# Patient Record
Sex: Male | Born: 1950 | Race: White | Hispanic: No | Marital: Single | Smoking: Never smoker
Health system: Southern US, Community
[De-identification: ages and names within clinical notes are randomized; demographics above are authoritative.]

## PROBLEM LIST (undated history)

## (undated) DIAGNOSIS — E119 Type 2 diabetes mellitus without complications: Secondary | ICD-10-CM

## (undated) DIAGNOSIS — I2510 Atherosclerotic heart disease of native coronary artery without angina pectoris: Secondary | ICD-10-CM

## (undated) DIAGNOSIS — I219 Acute myocardial infarction, unspecified: Secondary | ICD-10-CM

## (undated) DIAGNOSIS — I10 Essential (primary) hypertension: Secondary | ICD-10-CM

## (undated) DIAGNOSIS — I499 Cardiac arrhythmia, unspecified: Secondary | ICD-10-CM

## (undated) DIAGNOSIS — F32A Depression, unspecified: Secondary | ICD-10-CM

## (undated) DIAGNOSIS — F419 Anxiety disorder, unspecified: Secondary | ICD-10-CM

## (undated) DIAGNOSIS — I639 Cerebral infarction, unspecified: Secondary | ICD-10-CM

## (undated) HISTORY — PX: CORONARY ARTERY BYPASS GRAFT: SHX141

---

## 2019-10-29 DIAGNOSIS — I10 Essential (primary) hypertension: Secondary | ICD-10-CM | POA: Insufficient documentation

## 2019-10-29 DIAGNOSIS — E119 Type 2 diabetes mellitus without complications: Secondary | ICD-10-CM | POA: Insufficient documentation

## 2020-02-26 ENCOUNTER — Emergency Department (HOSPITAL_COMMUNITY): Payer: Medicare HMO

## 2020-02-26 ENCOUNTER — Other Ambulatory Visit: Payer: Self-pay

## 2020-02-26 ENCOUNTER — Inpatient Hospital Stay (HOSPITAL_COMMUNITY)
Admission: EM | Admit: 2020-02-26 | Discharge: 2020-03-08 | DRG: 638 | Disposition: A | Discharge status: Skilled Nursing Facility | Payer: Medicare HMO | Attending: Internal Medicine | Admitting: Internal Medicine

## 2020-02-26 DIAGNOSIS — M8448XA Pathological fracture, other site, initial encounter for fracture: Secondary | ICD-10-CM | POA: Diagnosis present

## 2020-02-26 DIAGNOSIS — Y9301 Activity, walking, marching and hiking: Secondary | ICD-10-CM | POA: Diagnosis present

## 2020-02-26 DIAGNOSIS — R000 Tachycardia, unspecified: Secondary | ICD-10-CM | POA: Diagnosis present

## 2020-02-26 DIAGNOSIS — S2232XA Fracture of one rib, left side, initial encounter for closed fracture: Secondary | ICD-10-CM

## 2020-02-26 DIAGNOSIS — I248 Other forms of acute ischemic heart disease: Secondary | ICD-10-CM | POA: Diagnosis present

## 2020-02-26 DIAGNOSIS — E11649 Type 2 diabetes mellitus with hypoglycemia without coma: Secondary | ICD-10-CM | POA: Diagnosis present

## 2020-02-26 DIAGNOSIS — E875 Hyperkalemia: Secondary | ICD-10-CM | POA: Diagnosis present

## 2020-02-26 DIAGNOSIS — I959 Hypotension, unspecified: Secondary | ICD-10-CM | POA: Diagnosis not present

## 2020-02-26 DIAGNOSIS — I480 Paroxysmal atrial fibrillation: Secondary | ICD-10-CM | POA: Diagnosis present

## 2020-02-26 DIAGNOSIS — I361 Nonrheumatic tricuspid (valve) insufficiency: Secondary | ICD-10-CM | POA: Diagnosis not present

## 2020-02-26 DIAGNOSIS — E1110 Type 2 diabetes mellitus with ketoacidosis without coma: Secondary | ICD-10-CM | POA: Diagnosis present

## 2020-02-26 DIAGNOSIS — E785 Hyperlipidemia, unspecified: Secondary | ICD-10-CM | POA: Diagnosis present

## 2020-02-26 DIAGNOSIS — Z91128 Patient's intentional underdosing of medication regimen for other reason: Secondary | ICD-10-CM

## 2020-02-26 DIAGNOSIS — Z9181 History of falling: Secondary | ICD-10-CM | POA: Diagnosis not present

## 2020-02-26 DIAGNOSIS — Z8673 Personal history of transient ischemic attack (TIA), and cerebral infarction without residual deficits: Secondary | ICD-10-CM

## 2020-02-26 DIAGNOSIS — Z7901 Long term (current) use of anticoagulants: Secondary | ICD-10-CM

## 2020-02-26 DIAGNOSIS — I4892 Unspecified atrial flutter: Secondary | ICD-10-CM | POA: Diagnosis present

## 2020-02-26 DIAGNOSIS — R7989 Other specified abnormal findings of blood chemistry: Secondary | ICD-10-CM | POA: Diagnosis not present

## 2020-02-26 DIAGNOSIS — T383X6A Underdosing of insulin and oral hypoglycemic [antidiabetic] drugs, initial encounter: Secondary | ICD-10-CM | POA: Diagnosis present

## 2020-02-26 DIAGNOSIS — Z951 Presence of aortocoronary bypass graft: Secondary | ICD-10-CM

## 2020-02-26 DIAGNOSIS — T50916A Underdosing of multiple unspecified drugs, medicaments and biological substances, initial encounter: Secondary | ICD-10-CM | POA: Diagnosis present

## 2020-02-26 DIAGNOSIS — R34 Anuria and oliguria: Secondary | ICD-10-CM

## 2020-02-26 DIAGNOSIS — F32A Depression, unspecified: Secondary | ICD-10-CM | POA: Diagnosis present

## 2020-02-26 DIAGNOSIS — R112 Nausea with vomiting, unspecified: Secondary | ICD-10-CM | POA: Diagnosis present

## 2020-02-26 DIAGNOSIS — E860 Dehydration: Secondary | ICD-10-CM | POA: Diagnosis present

## 2020-02-26 DIAGNOSIS — Z20822 Contact with and (suspected) exposure to covid-19: Secondary | ICD-10-CM | POA: Diagnosis present

## 2020-02-26 DIAGNOSIS — F419 Anxiety disorder, unspecified: Secondary | ICD-10-CM | POA: Diagnosis present

## 2020-02-26 DIAGNOSIS — I10 Essential (primary) hypertension: Secondary | ICD-10-CM | POA: Diagnosis present

## 2020-02-26 DIAGNOSIS — R778 Other specified abnormalities of plasma proteins: Secondary | ICD-10-CM | POA: Diagnosis not present

## 2020-02-26 DIAGNOSIS — R7881 Bacteremia: Secondary | ICD-10-CM | POA: Diagnosis not present

## 2020-02-26 DIAGNOSIS — I2510 Atherosclerotic heart disease of native coronary artery without angina pectoris: Secondary | ICD-10-CM | POA: Diagnosis present

## 2020-02-26 DIAGNOSIS — Y92009 Unspecified place in unspecified non-institutional (private) residence as the place of occurrence of the external cause: Secondary | ICD-10-CM

## 2020-02-26 DIAGNOSIS — E1100 Type 2 diabetes mellitus with hyperosmolarity without nonketotic hyperglycemic-hyperosmolar coma (NKHHC): Secondary | ICD-10-CM | POA: Diagnosis present

## 2020-02-26 DIAGNOSIS — R45851 Suicidal ideations: Secondary | ICD-10-CM | POA: Diagnosis present

## 2020-02-26 DIAGNOSIS — W010XXA Fall on same level from slipping, tripping and stumbling without subsequent striking against object, initial encounter: Secondary | ICD-10-CM | POA: Diagnosis present

## 2020-02-26 DIAGNOSIS — I4891 Unspecified atrial fibrillation: Secondary | ICD-10-CM | POA: Diagnosis not present

## 2020-02-26 DIAGNOSIS — R0602 Shortness of breath: Secondary | ICD-10-CM

## 2020-02-26 HISTORY — DX: Type 2 diabetes mellitus without complications: E11.9

## 2020-02-26 HISTORY — DX: Atherosclerotic heart disease of native coronary artery without angina pectoris: I25.10

## 2020-02-26 HISTORY — DX: Anxiety disorder, unspecified: F41.9

## 2020-02-26 HISTORY — DX: Essential (primary) hypertension: I10

## 2020-02-26 HISTORY — DX: Acute myocardial infarction, unspecified: I21.9

## 2020-02-26 HISTORY — DX: Cardiac arrhythmia, unspecified: I49.9

## 2020-02-26 HISTORY — DX: Depression, unspecified: F32.A

## 2020-02-26 HISTORY — DX: Cerebral infarction, unspecified: I63.9

## 2020-02-26 LAB — CBC WITH DIFFERENTIAL/PLATELET
Abs Immature Granulocytes: 0.15 10*3/uL — ABNORMAL HIGH (ref 0.00–0.07)
Basophils Absolute: 0 10*3/uL (ref 0.0–0.1)
Basophils Relative: 0 %
Eosinophils Absolute: 0 10*3/uL (ref 0.0–0.5)
Eosinophils Relative: 0 %
HCT: 45.8 % (ref 39.0–52.0)
Hemoglobin: 14.9 g/dL (ref 13.0–17.0)
Immature Granulocytes: 1 %
Lymphocytes Relative: 5 %
Lymphs Abs: 0.7 10*3/uL (ref 0.7–4.0)
MCH: 28.5 pg (ref 26.0–34.0)
MCHC: 32.5 g/dL (ref 30.0–36.0)
MCV: 87.7 fL (ref 80.0–100.0)
Monocytes Absolute: 0.9 10*3/uL (ref 0.1–1.0)
Monocytes Relative: 6 %
Neutro Abs: 12.9 10*3/uL — ABNORMAL HIGH (ref 1.7–7.7)
Neutrophils Relative %: 88 %
Platelets: 205 10*3/uL (ref 150–400)
RBC: 5.22 MIL/uL (ref 4.22–5.81)
RDW: 14.6 % (ref 11.5–15.5)
WBC: 14.7 10*3/uL — ABNORMAL HIGH (ref 4.0–10.5)
nRBC: 0 % (ref 0.0–0.2)

## 2020-02-26 LAB — CBC
HCT: 53.6 % — ABNORMAL HIGH (ref 39.0–52.0)
Hemoglobin: 16.8 g/dL (ref 13.0–17.0)
MCH: 28.4 pg (ref 26.0–34.0)
MCHC: 31.3 g/dL (ref 30.0–36.0)
MCV: 90.7 fL (ref 80.0–100.0)
Platelets: 217 10*3/uL (ref 150–400)
RBC: 5.91 MIL/uL — ABNORMAL HIGH (ref 4.22–5.81)
RDW: 14.6 % (ref 11.5–15.5)
WBC: 16.1 10*3/uL — ABNORMAL HIGH (ref 4.0–10.5)
nRBC: 0 % (ref 0.0–0.2)

## 2020-02-26 LAB — COMPREHENSIVE METABOLIC PANEL
ALT: 18 U/L (ref 0–44)
ALT: 15 U/L (ref 0–44)
AST: 17 U/L (ref 15–41)
AST: 16 U/L (ref 15–41)
Albumin: 3.5 g/dL (ref 3.5–5.0)
Albumin: 2.9 g/dL — ABNORMAL LOW (ref 3.5–5.0)
Alkaline Phosphatase: 221 U/L — ABNORMAL HIGH (ref 38–126)
Alkaline Phosphatase: 181 U/L — ABNORMAL HIGH (ref 38–126)
Anion gap: 29 — ABNORMAL HIGH (ref 5–15)
Anion gap: 15 (ref 5–15)
BUN: 37 mg/dL — ABNORMAL HIGH (ref 8–23)
BUN: 34 mg/dL — ABNORMAL HIGH (ref 8–23)
CO2: 12 mmol/L — ABNORMAL LOW (ref 22–32)
CO2: 19 mmol/L — ABNORMAL LOW (ref 22–32)
Calcium: 10.6 mg/dL — ABNORMAL HIGH (ref 8.9–10.3)
Calcium: 9.1 mg/dL (ref 8.9–10.3)
Chloride: 94 mmol/L — ABNORMAL LOW (ref 98–111)
Chloride: 107 mmol/L (ref 98–111)
Creatinine, Ser: 1.89 mg/dL — ABNORMAL HIGH (ref 0.61–1.24)
Creatinine, Ser: 1.41 mg/dL — ABNORMAL HIGH (ref 0.61–1.24)
GFR, Estimated: 38 mL/min — ABNORMAL LOW (ref >60)
GFR, Estimated: 54 mL/min — ABNORMAL LOW (ref >60)
Glucose, Bld: 544 mg/dL (ref 70–99)
Glucose, Bld: 215 mg/dL — ABNORMAL HIGH (ref 70–99)
Potassium: 5.6 mmol/L — ABNORMAL HIGH (ref 3.5–5.1)
Potassium: 3.6 mmol/L (ref 3.5–5.1)
Sodium: 135 mmol/L (ref 135–145)
Sodium: 141 mmol/L (ref 135–145)
Total Bilirubin: 2.3 mg/dL — ABNORMAL HIGH (ref 0.3–1.2)
Total Bilirubin: 1.3 mg/dL — ABNORMAL HIGH (ref 0.3–1.2)
Total Protein: 7.4 g/dL (ref 6.5–8.1)
Total Protein: 5.7 g/dL — ABNORMAL LOW (ref 6.5–8.1)

## 2020-02-26 LAB — HEMOGLOBIN A1C
Hgb A1c MFr Bld: 12.8 % — ABNORMAL HIGH (ref 4.8–5.6)
Mean Plasma Glucose: 320.66 mg/dL

## 2020-02-26 LAB — I-STAT CHEM 8, ED
BUN: 39 mg/dL — ABNORMAL HIGH (ref 8–23)
Calcium, Ion: 1.17 mmol/L (ref 1.15–1.40)
Chloride: 102 mmol/L (ref 98–111)
Creatinine, Ser: 1.1 mg/dL (ref 0.61–1.24)
Glucose, Bld: 546 mg/dL (ref 70–99)
HCT: 55 % — ABNORMAL HIGH (ref 39.0–52.0)
Hemoglobin: 18.7 g/dL — ABNORMAL HIGH (ref 13.0–17.0)
Potassium: 5.2 mmol/L — ABNORMAL HIGH (ref 3.5–5.1)
Sodium: 135 mmol/L (ref 135–145)
TCO2: 14 mmol/L — ABNORMAL LOW (ref 22–32)

## 2020-02-26 LAB — CBG MONITORING, ED
Glucose-Capillary: 451 mg/dL — ABNORMAL HIGH (ref 70–99)
Glucose-Capillary: 402 mg/dL — ABNORMAL HIGH (ref 70–99)
Glucose-Capillary: 354 mg/dL — ABNORMAL HIGH (ref 70–99)
Glucose-Capillary: 378 mg/dL — ABNORMAL HIGH (ref 70–99)
Glucose-Capillary: 275 mg/dL — ABNORMAL HIGH (ref 70–99)
Glucose-Capillary: 200 mg/dL — ABNORMAL HIGH (ref 70–99)

## 2020-02-26 LAB — I-STAT VENOUS BLOOD GAS, ED
Acid-base deficit: 15 mmol/L — ABNORMAL HIGH (ref 0.0–2.0)
Bicarbonate: 13.1 mmol/L — ABNORMAL LOW (ref 20.0–28.0)
Calcium, Ion: 1.17 mmol/L (ref 1.15–1.40)
HCT: 54 % — ABNORMAL HIGH (ref 39.0–52.0)
Hemoglobin: 18.4 g/dL — ABNORMAL HIGH (ref 13.0–17.0)
O2 Saturation: 69 %
Potassium: 5.2 mmol/L — ABNORMAL HIGH (ref 3.5–5.1)
Sodium: 135 mmol/L (ref 135–145)
TCO2: 14 mmol/L — ABNORMAL LOW (ref 22–32)
pCO2, Ven: 37.6 mmHg — ABNORMAL LOW (ref 44.0–60.0)
pH, Ven: 7.151 — CL (ref 7.250–7.430)
pO2, Ven: 46 mmHg — ABNORMAL HIGH (ref 32.0–45.0)

## 2020-02-26 LAB — RESP PANEL BY RT-PCR (FLU A&B, COVID) ARPGX2
Influenza A by PCR: NEGATIVE
Influenza B by PCR: NEGATIVE
SARS Coronavirus 2 by RT PCR: NEGATIVE

## 2020-02-26 LAB — GLUCOSE, CAPILLARY
Glucose-Capillary: 136 mg/dL — ABNORMAL HIGH (ref 70–99)
Glucose-Capillary: 144 mg/dL — ABNORMAL HIGH (ref 70–99)

## 2020-02-26 LAB — PROTIME-INR
INR: 1.1 (ref 0.8–1.2)
Prothrombin Time: 13.5 seconds (ref 11.4–15.2)

## 2020-02-26 LAB — CK: Total CK: 100 U/L (ref 49–397)

## 2020-02-26 LAB — PHOSPHORUS: Phosphorus: 2 mg/dL — ABNORMAL LOW (ref 2.5–4.6)

## 2020-02-26 LAB — BETA-HYDROXYBUTYRIC ACID: Beta-Hydroxybutyric Acid: >8 mmol/L — ABNORMAL HIGH (ref 0.05–0.27)

## 2020-02-26 LAB — SAMPLE TO BLOOD BANK

## 2020-02-26 LAB — ETHANOL: Alcohol, Ethyl (B): <10 mg/dL (ref <10)

## 2020-02-26 LAB — OSMOLALITY: Osmolality: 319 mOsm/kg — ABNORMAL HIGH (ref 275–295)

## 2020-02-26 LAB — LACTIC ACID, PLASMA
Lactic Acid, Venous: 3.2 mmol/L (ref 0.5–1.9)
Lactic Acid, Venous: 2.6 mmol/L (ref 0.5–1.9)

## 2020-02-26 LAB — HIV ANTIBODY (ROUTINE TESTING W REFLEX): HIV Screen 4th Generation wRfx: NONREACTIVE

## 2020-02-26 LAB — MAGNESIUM: Magnesium: 2.4 mg/dL (ref 1.7–2.4)

## 2020-02-26 LAB — APTT: aPTT: 24 seconds (ref 24–36)

## 2020-02-26 MED ORDER — SODIUM CHLORIDE 0.9% FLUSH
3.0000 mL | Freq: Two times a day (BID) | INTRAVENOUS | Status: DC
  Administered 2020-02-28 – 2020-03-08 (×18): 3 mL via INTRAVENOUS

## 2020-02-26 MED ORDER — DEXTROSE IN LACTATED RINGERS 5 % IV SOLN: INTRAVENOUS | Status: DC

## 2020-02-26 MED ORDER — ACETAMINOPHEN 650 MG RE SUPP: 650.0000 mg | Freq: Four times a day (QID) | RECTAL | Status: DC | PRN

## 2020-02-26 MED ORDER — LACTATED RINGERS IV BOLUS: 20.0000 mL/kg | Freq: Once | INTRAVENOUS | Status: DC

## 2020-02-26 MED ORDER — IOHEXOL 300 MG/ML  SOLN
100.0000 mL | Freq: Once | INTRAMUSCULAR | Status: AC | PRN
  Administered 2020-02-26: 100 mL via INTRAVENOUS

## 2020-02-26 MED ORDER — INSULIN REGULAR(HUMAN) IN NACL 100-0.9 UT/100ML-% IV SOLN
INTRAVENOUS | Status: DC
  Administered 2020-02-27: 2.8 [IU]/h via INTRAVENOUS
  Filled 2020-02-26 (×2): qty 100

## 2020-02-26 MED ORDER — NITROGLYCERIN 2 % TD OINT: 0.5000 [in_us] | TOPICAL_OINTMENT | Freq: Once | TRANSDERMAL | Status: DC

## 2020-02-26 MED ORDER — ROSUVASTATIN CALCIUM 20 MG PO TABS
20.0000 mg | ORAL_TABLET | Freq: Every day | ORAL | Status: DC
  Administered 2020-02-27 – 2020-03-08 (×11): 20 mg via ORAL
  Filled 2020-02-26 (×11): qty 1

## 2020-02-26 MED ORDER — LACTATED RINGERS IV SOLN: INTRAVENOUS | Status: DC

## 2020-02-26 MED ORDER — METOPROLOL TARTRATE 50 MG PO TABS
50.0000 mg | ORAL_TABLET | Freq: Two times a day (BID) | ORAL | Status: DC
  Administered 2020-02-27: 50 mg via ORAL
  Filled 2020-02-26 (×2): qty 1

## 2020-02-26 MED ORDER — ACETAMINOPHEN 325 MG PO TABS
650.0000 mg | ORAL_TABLET | Freq: Four times a day (QID) | ORAL | Status: DC | PRN
  Administered 2020-02-27 – 2020-03-07 (×5): 650 mg via ORAL
  Filled 2020-02-26 (×6): qty 2

## 2020-02-26 MED ORDER — DEXTROSE 50 % IV SOLN: 0.0000 mL | INTRAVENOUS | Status: DC | PRN

## 2020-02-26 MED ORDER — LACTATED RINGERS IV BOLUS
1000.0000 mL | Freq: Once | INTRAVENOUS | Status: AC
  Administered 2020-02-26: 1000 mL via INTRAVENOUS

## 2020-02-26 MED ORDER — APIXABAN 5 MG PO TABS
5.0000 mg | ORAL_TABLET | Freq: Two times a day (BID) | ORAL | Status: DC
  Administered 2020-02-26 – 2020-03-08 (×23): 5 mg via ORAL
  Filled 2020-02-26 (×23): qty 1

## 2020-02-26 MED ORDER — HYDROMORPHONE HCL 1 MG/ML IJ SOLN
0.5000 mg | INTRAMUSCULAR | Status: DC | PRN
  Administered 2020-02-27: 0.5 mg via INTRAVENOUS
  Filled 2020-02-26: qty 1

## 2020-02-26 MED ORDER — ENOXAPARIN SODIUM 40 MG/0.4ML ~~LOC~~ SOLN: 40.0000 mg | SUBCUTANEOUS | Status: DC

## 2020-02-26 MED ORDER — INSULIN REGULAR(HUMAN) IN NACL 100-0.9 UT/100ML-% IV SOLN
INTRAVENOUS | Status: DC
  Administered 2020-02-26: 13 [IU]/h via INTRAVENOUS
  Filled 2020-02-26: qty 100

## 2020-02-26 NOTE — ED Triage Notes (Signed)
Pt BIB [facility] d/t being found on the floor by his son. 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 EMS reports coffee ground emesis in pts beard upon their arrival, pt c/o pain primarily in his ribs. Pt does take a blood thinner (plavix), CBG 561, pulse 130's, cap of 17, A/Ox3, verbal- able to make needs known. Pt unable to recall falling or if he hit his head.

## 2020-02-26 NOTE — ED Notes (Signed)
Wy[place], PA informed of I-Stat Chem 8 results.

## 2020-02-26 NOTE — ED Notes (Signed)
I Stat results reported to RN Page, and Marchelle Folks trauma RN

## 2020-02-26 NOTE — ED Provider Notes (Signed)
Ultrasound ED Peripheral IV (Provider)  Date/Time: 02/26/2020 4:05 PM Performed by: Gailen Shelter, PA Authorized by: Gailen Shelter, PA   Procedure details:    Indications: hydration and poor IV access     Skin Prep: chlorhexidine gluconate     Location:  Right AC   Angiocath:  20 G   Bedside Ultrasound Guided: No     Images: not archived     Patient tolerated procedure without complications: Yes     Dressing applied: Yes       Gailen Shelter, PA 02/26/20 1605    Zadie Rhine, MD 02/28/20 [phone]

## 2020-02-26 NOTE — ED Notes (Signed)
Pt was cleaned d/t urinating in the bed before transporting to CT.

## 2020-02-26 NOTE — Hospital Course (Addendum)
Jason [name] is a 69 y.o. male with PMH of T2DM, HTN anxiety/depression, previous CVA, CAD s/p CABG  who presented to the [facility] after a fall and was admitted for hyperglycemia secondary to medication nonadherence. Patient was treated for mixed picture of DKA/HHS. HR was controlled with metoprolol and his rib pain was managed with lidocaine patch and pain meds.  DKA/HHS Patient presented to [facility] after a recent fall and being found down by son. Patient was on the ground for several days apparently. In the ED, he was found to be significantly hyperglycemic with an elevated osmolality and a anion gap metabolic acidosis positive for ketones. Although patient's laboratory analysis showed a mixed picture of DKA and HHS, he had some neurologic symptoms such as altered mental status concerning for HHS. Regardless, patient was started on IV insulin therapy and fluid repleted with roughly 8 L of LR within 24 to 36 hours. Patient's potassium was supplemented during this time.  Eventually he was successfully transitioned to subcutaneous insulin when his anion gap acidosis had resolved and he had mild improvement of his mentation. Blood sugar well controlled on Lantus 25 mg daily, NovoLog 7 units 3 times daily with meals and sliding scale insulin.   Fall Rib fractures Patient w/ history of frequent calls presented after a fall on his left side. CXR showed acute, displaced L 10th rib fracture with multiple chronic rib fractures. 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 Patient continued to have left ribs pain during admission that limited his mobility. This was managed with 2 lidocaine, prn IV dilaudid, tylenol and oxy. Patient will required frequent PT and pain control.   A. Fib Patient recently diagnosed with A. fib and started on metoprolol for rate control and Eliquis for 1 cycle regulation. Patient presents to the hospital with EKG  showing signs of atrial flutter with rates in the 130s.  Patient was restarted on his metoprolol at reduced dose of 50 mg twice daily.  This was titrated up to metoprolol 100 mg twice daily with improvement of his rate.  Patient was continued on anticoagulation with Eliquis in the hospital. Patient had echo findings consistent of reduced ejection fraction of 40 to 45%, global hypokinesis, moderate LVH and moderately decreased RVSF. This will need to be repeated in the future as the patient's rate was not well controlled during this echo. His metoprolol was reduced back to 50 mg due to hypotension and his HR was stable, ranging from 60-70s.    Anxiety/depression  Patient presents with worsening depression/anxiety with suicidal ideation. Patient stated that his SI is due to progression of his medical illness, isolation from his son and grandchildren, and progressive loss of independence.  He is on buspirone 10 mg daily, Zoloft 25 mg daily, and trazodone 50 mg daily at home.  These medications were held due to acute illness and altered mental status. He was started on Bupropion 4 days before discharge with some improvement in mood. He will likely need outpatient follow-up with psychiatry for additional management with medications and counseling.  Elevated troponin Patient had an elevated troponin in the setting of elevated heart rate with atrial flutter. Troponinemia thought to be secondary to demand ischemia in the setting of atrial flutter and stress from DKA/HHS.   HTN Patient has a history of hypertension on amlodipine, Benzapril, hydralazine and metoprolol. All antihypertensives were held on admission.  Patient's blood pressure mentions will be slowly restarted  as his acute illness resolves.  Hyperlipidemia CAD Patient has a history of CAD status post CABG more than 20 years ago. Initially started on aspirin and Plavix. Aspirin has been discontinued years ago. Plavix discontinued after recent  diagnosis of A. Fib.  Subsequently started on Eliquis.

## 2020-02-26 NOTE — ED Provider Notes (Signed)
Patient seen/examined in the Emergency Department in conjunction with Advanced Practice Provider [facility] Patient presents via EMS after being found in the floor.  Last known well was on December 7.  Patient apparently was covered in vomit while lying on the floor. Exam : Awake alert, ill-appearing.  Patient is moving all extremities x4 with no obvious deformity to his extremities.  He has diffuse chest and abdominal tenderness. Plan: I immediately activated a level 2 trauma as patient has evidence of fall, injury and per previous reports is on Eliquis.  Patient has a history of atrial flutter and CAD per previous records. Will follow closely. Per nursing, rectal temp is 97.9, no blood or melena noted   Zadie Rhine, MD 02/26/20 1356

## 2020-02-26 NOTE — ED Notes (Signed)
Patient mouth was swabbed w/ water.

## 2020-02-26 NOTE — ED Notes (Signed)
I Stat VBG result of Ph 7.151 reported to [facility] PA

## 2020-02-26 NOTE — Progress Notes (Signed)
   02/26/20 1340  Clinical Encounter Type  Visited With Patient not available  Visit Type Trauma  Referral From Nurse  Consult/Referral To Chaplain  Chaplain responded. Spoke with [employer]. Fall on thinners. The patient's family is not present. Angelica Chessman said she will call if a chaplain is needed. This note was prepared by Deneen Harts, M.Div..  For questions please contact by phone [phone].

## 2020-02-26 NOTE — ED Provider Notes (Signed)
[name] EMERGENCY DEPARTMENT Provider Note   CSN: [id number] Arrival date & time: 02/26/20  1314     History Chief Complaint  Patient presents with  . Fall  . Sepsis    Jason [name] is a 69 y.o. male.  HPI Jason [name] a 69 y.o.male with a medical history of anxiety/depression, type 2 diabetes mellitus, hypertension, previous CVA, CAD status post CABG who presented via EMS with complaints fall--found on ground and brought in by EMS.  Also has history of A. fib RVR first diagnosed approximately 4 months ago in August.  Patient was placed on Eliquis due to a chads vas score of 6 was started metoprolol 70 mg twice daily for rate control during his last hospital admission at [facility] 12/30/19.  Patient brought into emergency department by EMS after being found on the ground by patient's son just prior to arrival.  Per EMS son states that he last saw his father on Tuesday the 30th and has not talked to patient since.  I was unable to reach his son as we do not have any demographic information for this patient.  Patient states that he does not remember falling states that he thinks that he has been laying on the ground for a week or 2.  Patient is primarily complaining of significant left side lateral thoracic pain.  He states it is aching and constant.  Is also complaining of dry mouth and diffuse body aches.  Per EMS he was found with vomitus in his beard question of coffee-ground emesis.  He was responsive at that time had a CBG of 561 and was tachycardic and tachypneic.  On my examination patient continues to be alert and oriented x3 but states he does not really fall over the event surrounding this at all.  He states he is not been able to take any of his medications.  He states he is on a blood thinner but cannot recall the name of it.  He denies any fevers, cough, shortness of breath.  He denies any abdominal pain or chest pain.  Denies any  leg pain or swelling.    No past medical history on file.  There are no problems to display for this patient.    No family history on file.     Home Medications Prior to Admission medications   Not on File    Allergies    Patient has no allergy information on record.  Review of Systems   Review of Systems  Constitutional: Positive for fatigue.  Musculoskeletal: Positive for myalgias.  Neurological: Positive for weakness.  All other systems reviewed and are negative.   Physical Exam Updated Vital Signs Ht 5\' 10"  (1.778 m)   Wt 108.9 kg   BMI 34.44 kg/m   Physical Exam Vitals and nursing note reviewed.  Constitutional:      General: He is in acute distress.     Appearance: He is not diaphoretic.     Comments: Patient is 69 year old man appears ill both acutely and chronically. Has been seen tenderness.  Poor skin turgor.  Dry oral mucosa.  He is able to answer questions and follow commands. Does have vomitus in beard  HENT:     Head: Normocephalic and atraumatic.     Nose: Nose normal.     Mouth/Throat:     Mouth: Mucous membranes are dry.  Eyes:     General: No scleral icterus. Cardiovascular:     Rate and  Rhythm: Regular rhythm. Tachycardia present.     Pulses: Normal pulses.     Heart sounds: Normal heart sounds.  Pulmonary:     Effort: Pulmonary effort is normal. No respiratory distress.     Breath sounds: Normal breath sounds. No wheezing.     Comments: Tenderness of the left lateral chest wall Chest:     Chest wall: Tenderness present.  Abdominal:     Palpations: Abdomen is soft.     Tenderness: There is no abdominal tenderness. There is no guarding or rebound.  Musculoskeletal:     Cervical back: Normal range of motion.     Right lower leg: No edema.     Left lower leg: No edema.     Comments: No lower extremity edema.  Patient has evidence of surgically removed toes with no current wounds.  Skin:    General: Skin is warm and dry.      Capillary Refill: Capillary refill takes less than 2 seconds.     Comments: Patient is covered and grime, some questionable fecal matter.  Neurological:     Mental Status: He is alert and oriented to person, place, and time. Mental status is at baseline.     Comments: Alert and oriented to self, place, time and event.   He is not impressively articulate however speech is fluent, clear without dysarthria or dysphasia.   Strength 5/5 in upper/lower extremities  Sensation intact in upper/lower extremities   CN I not tested  CN II grossly intact visual fields bilaterally. Did not visualize posterior eye.   CN III, IV, VI PERRLA and EOMs intact bilaterally  CN V Intact sensation to sharp and light touch to the face  CN VII facial movements symmetric  CN VIII not tested  CN IX, X no uvula deviation, symmetric rise of soft palate  CN XI 5/5 SCM and trapezius strength bilaterally  CN XII Midline tongue protrusion, symmetric L/R movements   Psychiatric:        Mood and Affect: Mood normal.        Behavior: Behavior normal.     ED Results / Procedures / Treatments   Labs (all labs ordered are listed, but only abnormal results are displayed) Labs Reviewed  RESP PANEL BY RT-PCR (FLU A&B, COVID) ARPGX2  CULTURE, BLOOD (SINGLE)  URINE CULTURE  COMPREHENSIVE METABOLIC PANEL  CBC  ETHANOL  URINALYSIS, ROUTINE W REFLEX MICROSCOPIC  LACTIC ACID, PLASMA  PROTIME-INR  CK  APTT  I-STAT CHEM 8, ED  SAMPLE TO BLOOD BANK    EKG None  Radiology No results found.  Procedures .Critical Care Performed by: Gailen Shelter, PA Authorized by: Gailen Shelter, PA   Critical care provider statement:    Critical care time (minutes):  45   Critical care was necessary to treat or prevent imminent or life-threatening deterioration of the following conditions:  Trauma and metabolic crisis   Critical care was time spent personally by me on the following activities:  Discussions with  consultants, evaluation of patient's response to treatment, examination of patient, ordering and performing treatments and interventions, ordering and review of laboratory studies, ordering and review of radiographic studies, pulse oximetry, re-evaluation of patient's condition, obtaining history from patient or surrogate and review of old charts   (including critical care time)  Medications Ordered in ED Medications  lactated ringers bolus 1,000 mL (has no administration in time range)    ED Course  I have reviewed the triage vital signs and the  nursing notes.  Pertinent labs & imaging results that were available during my care of the patient were reviewed by me and considered in my medical decision making (see chart for details).  Patient is a 69 year old male with past medical history detailed in HPI presented today after being found down.  On physical exam patient is tachycardic, tachypneic and ill-appearing.  He has tenderness of the left lateral chest wall and very small amount of bruising concerning for broken rib.  He has afebrile with rectal temperature is within normal limits.  He meets SIRS criteria however he is found to be in DKA which is likely the cause of his tachycardia and tachypnea.  There is no evidence of a source of infection.  We will obtain pan labs including trauma labs and sepsis labs.  CBC with evidence of hemoconcentration this is likely secondary to severe dehydration with significant hyperglycemia and patient appears significantly dehydrated with poor skin turgor and dry oral mucosa.  He does have an elevation in WBC however this may be consistent with his severe dehydration and his emesis.  CMP notable for anion gap of 29 with blood sugar of 544 elevated BUN no comparison labs available however his potassium is elevated today his CO2 is low he is clearly in DKA.  Will begin patient on glucose stabilizer and continue to bolus patient with fluids he has received 2 L  so far.  He was placed on maintenance fluid of 125/h lactated Ringer's.  Chest x-ray myself.  I agree of radiology read IMPRESSION:  1. Possible acute, minimally displaced left posterolateral tenth rib  fracture.  2. No acute cardiopulmonary process     Patient has profound acidosis on VBG of 7.151. Is in A. fib RVR currently on EKG.  Likely secondary to patient not having his rate control medication and being severely dehydrated due to poor intake and hyperglycemia.  Clinical Course as of 02/26/20 1504  Sat Feb 26, 2020  1408 20-gauge IV placed in right AC ultrasound was not used. [WF]  1500 pO2, Ven(!): 46.0 [WF]    Clinical Course User Index [WF] Gailen Shelter, [place]   I discussed this case with my attending physician who cosigned this note including patient's presenting symptoms, physical exam, and planned diagnostics and interventions. Attending physician stated agreement with plan or made changes to plan which were implemented.   Attending physician assessed patient at bedside.  MDM Rules/Calculators/A&P                          Patient care handed off to C. Coutour to follow-up on CT imaging.  Will ultimately require admission for DKA.   Final Clinical Impression(s) / ED Diagnoses Final diagnoses:  SOB (shortness of breath)    Rx / DC Orders ED Discharge Orders    None       Gailen Shelter, [place] 02/26/20 1604    Zadie Rhine, MD 02/28/20 [phone]

## 2020-02-26 NOTE — ED Notes (Signed)
Patient was cleaned with soap and water when he came in. Patient was found on floor by his son, after fallen. Pt. Stated he was on floor for a number of days, he was found in his feces.

## 2020-02-26 NOTE — Progress Notes (Signed)
ANTICOAGULATION CONSULT NOTE - Initial Consult  Pharmacy Consult for apixaban Indication: atrial fibrillation  Not on File  Patient Measurements: Height: 5\' 10"  (177.8 cm) Weight: 108.9 kg (240 lb) IBW/kg (Calculated) : 73  Vital Signs: Temp: 97.3 F (36.3 C) (12/11 1405) Temp Source: Axillary (12/11 1405) BP: 120/75 (12/11 1815) Pulse Rate: 138 (12/11 1815)  Labs: Recent Labs    02/26/20 1400 02/26/20 1422 02/26/20 1450  HGB 16.8 18.7* 18.4*  HCT 53.6* 55.0* 54.0*  PLT 217  --   --   APTT 24  --   --   LABPROT 13.5  --   --   INR 1.1  --   --   CREATININE 1.89* 1.10  --   CKTOTAL 100  --   --     Estimated Creatinine Clearance: 77.2 mL/min (by C-G formula based on SCr of 1.1 mg/dL).   Medical History: No past medical history on file.  Medications:  (Not in a hospital admission)   Assessment: 60 YOM who presented to the [facility] after a recent fall on apixaban PTA for Afib. CT head, spike and pelvis negative for s/s of bleeding. Pharmacy consulted to resume home apixaban dosing. Per patient he has not take any medications in over a month.   H/H high, Plt wnl, SCr 1.1   Goal of Therapy:  Stroke prevention Monitor platelets by anticoagulation protocol: Yes   Plan:  -Start apixaban 5 mg twice daily today  -Monitor renal fx, CBC and s/s of bleeding   [facility], PharmD., BCPS, BCCCP Clinical Pharmacist Please refer to [facility] for unit-specific pharmacist

## 2020-02-26 NOTE — ED Notes (Signed)
PA informed of critical labs: Glucose 544 & Lactic Acids 3.2.

## 2020-02-26 NOTE — ED Provider Notes (Signed)
Care assumed from [facility], [place]. See his note for full H&P.   Per his note, "Jason [name] a 69 y.o.male with a medical history of anxiety/depression, type 2 diabetes mellitus, hypertension, previous CVA, CAD status post CABG who presented via EMS with complaints fall--found on ground and brought in by EMS.  Also has history of A. fib RVR first diagnosed approximately 4 months ago in August.  Patient was placed on Eliquis due to a chads vas score of 6 was started metoprolol 70 mg twice daily for rate control during his last hospital admission at [facility] 12/30/19."   Physical Exam  BP (!) 144/89   Pulse (!) 132   Temp (!) 97.3 F (36.3 C) (Axillary)   Resp (!) 26   Ht 5\' 10"  (1.778 m)   Wt 108.9 kg   SpO2 100%   BMI 34.44 kg/m   Physical Exam  ED Course/Procedures   Clinical Course as of 02/26/20 1610  Sat Feb 26, 2020  1408 20-gauge IV placed in right AC ultrasound was not used. [WF]  1500 pO2, Ven(!): 46.0 [WF]    Clinical Course User Index [WF] Feb 28, 2020, Jason [name]    Procedures Results for orders placed or performed during the hospital encounter of 02/26/20  Resp Panel by RT-PCR (Flu A&B, Covid) Nasopharyngeal Swab   Specimen: Nasopharyngeal Swab; Nasopharyngeal(NP) swabs in vial transport medium  Result Value Ref Range   SARS Coronavirus 2 by RT PCR NEGATIVE NEGATIVE   Influenza A by PCR NEGATIVE NEGATIVE   Influenza B by PCR NEGATIVE NEGATIVE  Comprehensive metabolic panel  Result Value Ref Range   Sodium 135 135 - 145 mmol/L   Potassium 5.6 (H) 3.5 - 5.1 mmol/L   Chloride 94 (L) 98 - 111 mmol/L   CO2 12 (L) 22 - 32 mmol/L   Glucose, Bld 544 (HH) 70 - 99 mg/dL   BUN 37 (H) 8 - 23 mg/dL   Creatinine, Ser 14/11/21 (H) 0.61 - 1.24 mg/dL   Calcium 8.89 (H) 8.9 - 10.3 mg/dL   Total Protein 7.4 6.5 - 8.1 g/dL   Albumin 3.5 3.5 - 5.0 g/dL   AST 17 15 - 41 U/L   ALT 18 0 - 44 U/L   Alkaline Phosphatase 221 (H) 38 - 126 U/L   Total Bilirubin  2.3 (H) 0.3 - 1.2 mg/dL   GFR, Estimated 38 (L) >60 mL/min   Anion gap 29 (H) 5 - 15  CBC  Result Value Ref Range   WBC 16.1 (H) 4.0 - 10.5 K/uL   RBC 5.91 (H) 4.22 - 5.81 MIL/uL   Hemoglobin 16.8 13.0 - 17.0 g/dL   HCT 16.9 (H) 45.0 - 38.8 %   MCV 90.7 80.0 - 100.0 fL   MCH 28.4 26.0 - 34.0 pg   MCHC 31.3 30.0 - 36.0 g/dL   RDW 82.8 00.3 - 49.1 %   Platelets 217 150 - 400 K/uL   nRBC 0.0 0.0 - 0.2 %  Ethanol  Result Value Ref Range   Alcohol, Ethyl (B) <10 <10 mg/dL  Lactic acid, plasma  Result Value Ref Range   Lactic Acid, Venous 3.2 (HH) 0.5 - 1.9 mmol/L  Protime-INR  Result Value Ref Range   Prothrombin Time 13.5 11.4 - 15.2 seconds   INR 1.1 0.8 - 1.2  CK  Result Value Ref Range   Total CK 100 49 - 397 U/L  APTT  Result Value Ref Range   aPTT 24  24 - 36 seconds  I-Stat Chem 8, ED  Result Value Ref Range   Sodium 135 135 - 145 mmol/L   Potassium 5.2 (H) 3.5 - 5.1 mmol/L   Chloride 102 98 - 111 mmol/L   BUN 39 (H) 8 - 23 mg/dL   Creatinine, Ser 3.84 0.61 - 1.24 mg/dL   Glucose, Bld 665 (HH) 70 - 99 mg/dL   Calcium, Ion 9.93 5.70 - 1.40 mmol/L   TCO2 14 (L) 22 - 32 mmol/L   Hemoglobin 18.7 (H) 13.0 - 17.0 g/dL   HCT 17.7 (H) 93.9 - 03.0 %   Comment NOTIFIED PHYSICIAN   I-Stat venous blood gas, [facility] ED)  Result Value Ref Range   pH, Ven 7.151 (LL) 7.250 - 7.430   pCO2, Ven 37.6 (L) 44.0 - 60.0 mmHg   pO2, Ven 46.0 (H) 32.0 - 45.0 mmHg   Bicarbonate 13.1 (L) 20.0 - 28.0 mmol/L   TCO2 14 (L) 22 - 32 mmol/L   O2 Saturation 69.0 %   Acid-base deficit 15.0 (H) 0.0 - 2.0 mmol/L   Sodium 135 135 - 145 mmol/L   Potassium 5.2 (H) 3.5 - 5.1 mmol/L   Calcium, Ion 1.17 1.15 - 1.40 mmol/L   HCT 54.0 (H) 39.0 - 52.0 %   Hemoglobin 18.4 (H) 13.0 - 17.0 g/dL   Sample type VENOUS    Comment NOTIFIED PHYSICIAN   CBG monitoring, ED  Result Value Ref Range   Glucose-Capillary 451 (H) 70 - 99 mg/dL  Sample to Blood Bank  Result Value Ref Range   Blood Bank Specimen SAMPLE  AVAILABLE FOR TESTING    Sample Expiration      02/27/2020,2359 Performed at [facility], [address] [place], [place] [address]    CT HEAD WO CONTRAST  Result Date: 02/26/2020 CLINICAL DATA:  69 year old male with history of trauma from a fall. EXAM: CT HEAD WITHOUT CONTRAST CT CERVICAL SPINE WITHOUT CONTRAST TECHNIQUE: Multidetector CT imaging of the head and cervical spine was performed following the standard protocol without intravenous contrast. Multiplanar CT image reconstructions of the cervical spine were also generated. COMPARISON:  Head and cervical spine CT 10/18/2019. FINDINGS: CT HEAD FINDINGS Brain: Moderate cerebral atrophy with ex vacuo dilatation of the ventricular system. Patchy and confluent areas of decreased attenuation are noted throughout the deep and periventricular white matter of the cerebral hemispheres bilaterally, compatible with chronic microvascular ischemic disease.No evidence of acute infarction, hemorrhage, hydrocephalus, extra-axial collection or mass lesion/mass effect. Vascular: No hyperdense vessel or unexpected calcification. Skull: Normal. Negative for fracture or focal lesion. Sinuses/Orbits: No acute finding. Other: None. CT CERVICAL SPINE FINDINGS Alignment: Normal. Skull base and vertebrae: No acute fracture. No primary bone lesion or focal pathologic process. Soft tissues and spinal canal: No prevertebral fluid or swelling. No visible canal hematoma. Disc levels: Multilevel degenerative disc disease, most evident throughout the mid to lower cervical spine. Mild multilevel facet arthropathy. Upper chest: Unremarkable. Other: Old healed fracture of the medial aspect of the right clavicle with mild posttraumatic deformity. IMPRESSION: 1. No evidence of significant acute traumatic injury to the skull, brain or cervical spine. 2. Moderate cerebral atrophy with extensive chronic microvascular ischemic changes in the cerebral white matter, as above. 3. Mild  multilevel degenerative disc disease and cervical spondylosis. Electronically Signed   By: Trudie Reed M.D.   On: 02/26/2020 15:32   CT CHEST W CONTRAST  Result Date: 02/26/2020 CLINICAL DATA:  Abdominal trauma EXAM: CT CHEST, ABDOMEN, AND PELVIS  WITH CONTRAST TECHNIQUE: Multidetector CT imaging of the chest, abdomen and pelvis was performed following the standard protocol during bolus administration of intravenous contrast. CONTRAST:  100mL OMNIPAQUE IOHEXOL 300 MG/ML  SOLN COMPARISON:  None. FINDINGS: CT CHEST FINDINGS Cardiovascular: The heart size appears within normal limits. No pericardial effusion. Previous median sternotomy and CABG procedure. Aortic atherosclerosis. Mediastinum/Nodes: No enlarged axillary, supraclavicular, mediastinal, or hilar lymph nodes. The thyroid gland, trachea and esophagus are unremarkable. Lungs/Pleura: No pleural effusion. Linear, parenchymal bands are noted within both lower lobes, likely the sequelae of chronic inflammation or infection. No acute airspace consolidation, atelectasis or pneumothorax identified at this time. Musculoskeletal: The thoracic vertebral body heights are all well preserved. There are multiple acute on chronic left posterior rib fracture deformities including the left 6, seventh, eighth, ninth, tenth, eleventh and twelfth ribs. The left eighth and ninth ribs fractures appear slightly comminuted. Multiple acute on chronic right rib fracture deformities are also noted including a right posterolateral ninth and tenth ribs, and posterior right eleventh and twelfth ribs. Fractures at the right 11 and twelfth costovertebral junctions are also noted. The sternum appears intact. The thoracic vertebral body heights appear well preserved. CT ABDOMEN PELVIS FINDINGS Hepatobiliary: No signs of hepatic injury or perihepatic hematoma. Status post cholecystectomy with mild increase caliber of the CBD. Pancreas: There is diffuse fatty replacement of the  pancreas. No main duct dilatation, mass or inflammation noted. Spleen: No splenic injury or perisplenic hematoma. Adrenals/Urinary Tract: Normal right adrenal gland. 1.7 cm left adrenal gland myelolipoma is noted, image 59/3. Stomach/Bowel: No signs of renal injury. Inferior pole left kidney cyst measures 5.3 cm. Additional, bilateral low attenuation kidney lesions are too small to characterize measuring less than 1 cm. No hydronephrosis identified bilaterally. Urinary bladder is unremarkable. Stomach appears nondistended. There is mucosal enhancement and mild mural thickening involving the proximal duodenum. Here, there are 3 tiny foci of intramural gas which may reflect peptic ulcer disease, image 58/3. No signs of pneumoperitoneum. The remaining bowel loops are unremarkable. Vascular/Lymphatic: Aortic atherosclerosis without aneurysm. No abdominopelvic adenopathy. Reproductive: Prostate is unremarkable. Other: No abdominal wall hernia or abnormality. No abdominopelvic ascites. Musculoskeletal: The lumbar vertebral body heights are all well preserved. No compression fractures identified. IMPRESSION: 1. Bilateral acute on chronic rib fracture deformities are identified. The left eighth and ninth ribs appear comminuted. Fractures at the right 11 and twelfth costovertebral junctions are also noted. 2. No evidence for solid organ injury or pneumothorax. 3. There is mucosal enhancement and mild mural thickening involving the proximal duodenum. 3 tiny foci of intramural gas may reflect underlying peptic ulcer disease. No signs of pneumoperitoneum. 4. Left adrenal gland myelolipoma. 5. Aortic atherosclerosis. Aortic Atherosclerosis (ICD10-I70.0). Electronically Signed   By: Signa Kellaylor  Stroud M.D.   On: 02/26/2020 15:36   CT CERVICAL SPINE WO CONTRAST  Result Date: 02/26/2020 CLINICAL DATA:  69 year old male with history of trauma from a fall. EXAM: CT HEAD WITHOUT CONTRAST CT CERVICAL SPINE WITHOUT CONTRAST TECHNIQUE:  Multidetector CT imaging of the head and cervical spine was performed following the standard protocol without intravenous contrast. Multiplanar CT image reconstructions of the cervical spine were also generated. COMPARISON:  Head and cervical spine CT 10/18/2019. FINDINGS: CT HEAD FINDINGS Brain: Moderate cerebral atrophy with ex vacuo dilatation of the ventricular system. Patchy and confluent areas of decreased attenuation are noted throughout the deep and periventricular white matter of the cerebral hemispheres bilaterally, compatible with chronic microvascular ischemic disease.No evidence of acute infarction, hemorrhage, hydrocephalus, extra-axial collection or  mass lesion/mass effect. Vascular: No hyperdense vessel or unexpected calcification. Skull: Normal. Negative for fracture or focal lesion. Sinuses/Orbits: No acute finding. Other: None. CT CERVICAL SPINE FINDINGS Alignment: Normal. Skull base and vertebrae: No acute fracture. No primary bone lesion or focal pathologic process. Soft tissues and spinal canal: No prevertebral fluid or swelling. No visible canal hematoma. Disc levels: Multilevel degenerative disc disease, most evident throughout the mid to lower cervical spine. Mild multilevel facet arthropathy. Upper chest: Unremarkable. Other: Old healed fracture of the medial aspect of the right clavicle with mild posttraumatic deformity. IMPRESSION: 1. No evidence of significant acute traumatic injury to the skull, brain or cervical spine. 2. Moderate cerebral atrophy with extensive chronic microvascular ischemic changes in the cerebral white matter, as above. 3. Mild multilevel degenerative disc disease and cervical spondylosis. Electronically Signed   By: Trudie Reed M.D.   On: 02/26/2020 15:32   CT ABDOMEN PELVIS W CONTRAST  Result Date: 02/26/2020 CLINICAL DATA:  Abdominal trauma EXAM: CT CHEST, ABDOMEN, AND PELVIS WITH CONTRAST TECHNIQUE: Multidetector CT imaging of the chest, abdomen and  pelvis was performed following the standard protocol during bolus administration of intravenous contrast. CONTRAST:  OMNIPAQUE IOHEXOL 300 MG/ML  SOLN COMPARISON:  None. FINDINGS: CT CHEST FINDINGS Cardiovascular: The heart size appears within normal limits. No pericardial effusion. Previous median sternotomy and CABG procedure. Aortic atherosclerosis. Mediastinum/Nodes: No enlarged axillary, supraclavicular, mediastinal, or hilar lymph nodes. The thyroid gland, trachea and esophagus are unremarkable. Lungs/Pleura: No pleural effusion. Linear, parenchymal bands are noted within both lower lobes, likely the sequelae of chronic inflammation or infection. No acute airspace consolidation, atelectasis or pneumothorax identified at this time. Musculoskeletal: The thoracic vertebral body heights are all well preserved. There are multiple acute on chronic left posterior rib fracture deformities including the left 6, seventh, eighth, ninth, tenth, eleventh and twelfth ribs. The left eighth and ninth ribs fractures appear slightly comminuted. Multiple acute on chronic right rib fracture deformities are also noted including a right posterolateral ninth and tenth ribs, and posterior right eleventh and twelfth ribs. Fractures at the right 11 and twelfth costovertebral junctions are also noted. The sternum appears intact. The thoracic vertebral body heights appear well preserved. CT ABDOMEN PELVIS FINDINGS Hepatobiliary: No signs of hepatic injury or perihepatic hematoma. Status post cholecystectomy with mild increase caliber of the CBD. Pancreas: There is diffuse fatty replacement of the pancreas. No main duct dilatation, mass or inflammation noted. Spleen: No splenic injury or perisplenic hematoma. Adrenals/Urinary Tract: Normal right adrenal gland. 1.7 cm left adrenal gland myelolipoma is noted, image 59/3. Stomach/Bowel: No signs of renal injury. Inferior pole left kidney cyst measures 5.3 cm. Additional, bilateral low  attenuation kidney lesions are too small to characterize measuring less than 1 cm. No hydronephrosis identified bilaterally. Urinary bladder is unremarkable. Stomach appears nondistended. There is mucosal enhancement and mild mural thickening involving the proximal duodenum. Here, there are 3 tiny foci of intramural gas which may reflect peptic ulcer disease, image 58/3. No signs of pneumoperitoneum. The remaining bowel loops are unremarkable. Vascular/Lymphatic: Aortic atherosclerosis without aneurysm. No abdominopelvic adenopathy. Reproductive: Prostate is unremarkable. Other: No abdominal wall hernia or abnormality. No abdominopelvic ascites. Musculoskeletal: The lumbar vertebral body heights are all well preserved. No compression fractures identified. IMPRESSION: 1. Bilateral acute on chronic rib fracture deformities are identified. The left eighth and ninth ribs appear comminuted. Fractures at the right 11 and twelfth costovertebral junctions are also noted. 2. No evidence for solid organ injury or pneumothorax. 3. There  is mucosal enhancement and mild mural thickening involving the proximal duodenum. 3 tiny foci of intramural gas may reflect underlying peptic ulcer disease. No signs of pneumoperitoneum. 4. Left adrenal gland myelolipoma. 5. Aortic atherosclerosis. Aortic Atherosclerosis (ICD10-I70.0). Electronically Signed   By: Signa Kell M.D.   On: 02/26/2020 15:36   DG Chest Port 1 View  Result Date: 02/26/2020 CLINICAL DATA:  69 year old male found down. EXAM: PORTABLE CHEST 1 VIEW COMPARISON:  06/21/2019 FINDINGS: The heart size and mediastinal contours are within normal limits. Both lungs are clear. Possible acute, mildly displaced posterolateral left tenth rib fracture. Similar appearing multilevel bilateral posterior chronic rib fracture deformities. Median sternotomy wires in place. IMPRESSION: 1. Possible acute, minimally displaced left posterolateral tenth rib fracture. 2. No acute  cardiopulmonary process. Electronically Signed   By: Marliss Coots MD   On: 02/26/2020 14:26   CRITICAL CARE Performed by: Karrie Meres   Total critical care time: 31 minutes  Critical care time was exclusive of separately billable procedures and treating other patients.  Critical care was necessary to treat or prevent imminent or life-threatening deterioration.  Critical care was time spent personally by me on the following activities: development of treatment plan with patient and/or surrogate as well as nursing, discussions with consultants, evaluation of patient's response to treatment, examination of patient, obtaining history from patient or surrogate, ordering and performing treatments and interventions, ordering and review of laboratory studies, ordering and review of radiographic studies, pulse oximetry and re-evaluation of patient's condition.   MDM   69 y/o M presenting for eval after being found down. Found to be in DKA. Started on IVF, insulin drip, no supplemental K.   Reviewed/interpeted labs CBC with leukocytosis, otherwise reassuring CMP with hyperK, low bicarb, elevated BG, elevated AG, possible AKI  - consistent with DKA, IVF and insulin drip initiated coags wnl Lactic elevated Ck neg etoh neg covid neg ua pending on admission vbg with metabolic acidosis  Imaging reviewed/interpreted  CXR - 1. Possible acute, minimally displaced left posterolateral tenth rib fracture. 2. No acute cardiopulmonary process.  - incentive spirometer given CT head/cervical spine - 1. No evidence of significant acute traumatic injury to the skull, brain or cervical spine. 2. Moderate cerebral atrophy with extensive chronic microvascular ischemic changes in the cerebral white matter, as above. 3. Mild multilevel degenerative disc disease and cervical spondylosis. CT chest/abd/pelvis - 1. Bilateral acute on chronic rib fracture deformities are identified. The left eighth and ninth  ribs appear comminuted. Fractures at the right 11 and twelfth costovertebral junctions are also noted. 2. No evidence for solid organ injury or pneumothorax. 3. There is mucosal enhancement and mild mural thickening involving the proximal duodenum. 3 tiny foci of intramural gas may reflect underlying peptic ulcer disease. No signs of pneumoperitoneum. 4. Left adrenal gland myelolipoma. 5. Aortic atherosclerosis.   Pt in DKA. Will require admission for further tx.   4:01 PM CONSULT with Dr. Marchia Bond with internal medicine who accepts patient for admission    Rayne Du 02/26/20 1610    Charlynne Pander, MD 03/04/20 [phone]

## 2020-02-26 NOTE — H&P (Addendum)
Date: 02/26/2020               Patient Name:  Jason [name] MRN: [id number]  DOB: [birth year] Age / Sex: 69 y.o., male   PCP: No primary care provider on file.         Medical Service: Internal Medicine Teaching Service         Attending Physician: Dr. Mikey Bussing, Marthenia Rolling, DO    First Contact: Dr. Kirke Corin Pager: [phone]  Second Contact: Dr. Marchia Bond Pager: [phone]       After Hours (After 5p/  First Contact Pager: [phone]  weekends / holidays): Second Contact Pager: [phone]   Chief Complaint: Fall  History of Present Illness:  Jason [name] a 69 y.o.male with a medical history of T2DM, HTN anxiety/depression, previous CVA, CAD s/p CABG who presents to [facility] after recent fall.  Patient states that he lost his balance at home while walking and fell on his left side.  Patient denies any inciting events or prodromal symptoms.  Patient denies losing consciousness.  He states that he fell he did not have the strength to get up.  His son found him and called EMS.  Patient apparently has multiple falls in the past, with his most recent one a couple weeks ago where he fell on his right side.  Patient has associated dry mouth, polyuria, polydipsia, with some nausea and vomiting.  The vomiting is nonbilious nonbloody.  Patient is to recently starting insulin therapy and last 4 to 5 months.  He states that he was never appropriately shown how to use the insulin and therefore has not used it since being started on it otherwise he denies any recent infections, sick contacts, or recent travel.  He does not have any blurry vision, headaches, chest pain, palpitations, stomach pain, numbness or tingling, or trouble urinating.  Patient does admit to suicide ideation stating that in the last several months he is quit taking all of his medications saying "I better off just dying get it over with".  He attributes his suicidal ideation to downward trend in health, lack of seeing his grandkids, and poor home  situation.  Patient routinely receives care through the [facility] system.  Meds: Amlodipine 10 mg, e Eliquis 5 mg, benazepril 40 mg buspirone 10 mg, hydroxyzine 25 mg 3 times daily Lantus 30 units daily lispro 2 to 12 units 3 times daily with meals metoprolol 50 mg rosuvastatin 20 mg sertraline 25 mg trazodone 50 mg  Allergies: Allergies as of 02/26/2020  . (Not on File)   PMHL AFIB (Eliquis), CAD s/p CAGB, depression/anxiety, HTN, DM, CVA (06/2019)  Family History:  No family history on file.  Social History:  Last tobacco use, illicit drug use, or alcohol use.  Review of Systems: Get up except for what is noted on HPI  Physical Exam: Blood pressure 115/70, pulse (!) 138, temperature (!) 97.3 F (36.3 C), temperature source Axillary, resp. rate (!) 9, height 5\' 10"  (1.778 m), weight 108.9 kg, SpO2 98 %. Physical Exam Constitutional:      Appearance: He is ill-appearing and diaphoretic.  HENT:     Head: Normocephalic and atraumatic.     Mouth/Throat:     Mouth: Mucous membranes are dry.  Eyes:     Extraocular Movements: Extraocular movements intact.  Cardiovascular:     Rate and Rhythm: Tachycardia present.     Pulses: Normal pulses.  Pulmonary:     Effort: Pulmonary effort is normal.  Breath sounds: Normal breath sounds.  Abdominal:     Palpations: Abdomen is soft.     Tenderness: There is no abdominal tenderness.  Musculoskeletal:        General: Deformity (missing several toes on bilateral feet) present. Normal range of motion.  Skin:    General: Skin is warm.  Neurological:     General: No focal deficit present.     Mental Status: He is alert and oriented to person, place, and time.    EKG: personally reviewed my interpretation is as tachycardia  CXR: personally reviewed my interpretation is: 1. Possible acute, minimally displaced left posterolateral tenth rib fracture. 2. No acute cardiopulmonary process.   CT cervical spine 1. No evidence of  significant acute traumatic injury to the skull, brain or cervical spine. 2. Moderate cerebral atrophy with extensive chronic microvascular ischemic changes in the cerebral white matter, as above. 3. Mild multilevel degenerative disc disease and cervical Spondylosis.  CT abdomen 1. Bilateral acute on chronic rib fracture deformities are identified. The left eighth and ninth ribs appear comminuted. Fractures at the right 11 and twelfth costovertebral junctions are also noted. 2. No evidence for solid organ injury or pneumothorax. 3. There is mucosal enhancement and mild mural thickening involving the proximal duodenum. 3 tiny foci of intramural gas may reflect underlying peptic ulcer disease. No signs of pneumoperitoneum. 4. Left adrenal gland myelolipoma. 5. Aortic atherosclerosis.  Aortic Atherosclerosis (ICD10-I70.0).  CT head 1. No evidence of significant acute traumatic injury to the skull, brain or cervical spine. 2. Moderate cerebral atrophy with extensive chronic microvascular ischemic changes in the cerebral white matter, as above. 3. Mild multilevel degenerative disc disease and cervical spondylosis.  CT chest 1. Bilateral acute on chronic rib fracture deformities are identified. The left eighth and ninth ribs appear comminuted. Fractures at the right 11 and twelfth costovertebral junctions are also noted. 2. No evidence for solid organ injury or pneumothorax. 3. There is mucosal enhancement and mild mural thickening involving the proximal duodenum. 3 tiny foci of intramural gas may reflect underlying peptic ulcer disease. No signs of pneumoperitoneum. 4. Left adrenal gland myelolipoma. 5. Aortic atherosclerosis.   Assessment & Plan by Problem: Active Problems:   Hyperosmolar hyperglycemic state (HHS) (HCC)  Jason [name] a 69 y.o.male with a medical history of T2DM, HTN anxiety/depression, previous CVA, CAD s/p CABG who presented after a fall and was  admitted for hyperglycemia secondary to medication nonadherence.  DKA/HHS: Anion gap metabolic acidosis Patient presented with significantly elevated hyperglycemia with evidence of anion gap metabolic acidosis concerning for DKA versus HHS.  Considering patient's history of type 2 diabetes and prolonged history of not being on insulin is likely the patient has HHS and possible starvation ketosis from poor social status at home.  Beta hydroxybutyrate and serum osmolality are pending.  Patient was received at least 2 L of lactated Ringer's.  I will give him additional 2 L now.  We will start him on insulin infusion and supplement electrolytes as needed.  Patient was prescribed 30 units of glargine daily, not taking this medication secondary to not knowing how to use insulin. -Continue fluid resuscitation with LR -IV insulin infusion -Supplement potassium -Every 4 hours BMPs -Every hour CBGs -Consult diabetic education for affirmation on how to use insulin.  Fall: Patient presented after a fall.  Apparently has a significant history of falls.  Patient is also missing multiple toes on his foot which is likely made him more unstable with ambulation.  Patient did hit his head during this fall but CTA did not show any acute abnormalities.  Patient will likely need physical therapy for his deconditioning.  Patient does have some acute on chronic rib fractures noted on CXR. -PT/OT evaluation -We will start low-dose pain medication with Dilaudid 0.5 mg IV every 4 hours as needed  Atrial fibrillation: Patient has a recent diagnosis of atrial fibrillation with an elevated CHA2DS2-VASc score.  He was prescribed metoprolol for rate control and Eliquis for anticoagulation.  Patient states that he has not been on these medications secondary to recent suicidal ideation.  I will restart his Eliquis for anticoagulation and likely restart his metoprolol in the morning.  EKG shows sinus tachycardia rate is too elevated  to discern P waves.  Will repeat EKG in the morning to further determine his underlying rhythm. -Continue Eliquis 5 mg -We will hold metoprolol tonight and restart tomorrow morning at 50 mg twice daily. -Repeat EKG in the morning  HTN Patient has a history of hypertension on amlodipine 10 mg, Benzapril 40 mg, hydralazine 25 mg 3 times daily and metoprolol 50 mg daily.  I will hold all antihypertensive medications at this time in the setting of DKA.  I will likely restart metoprolol tomorrow. -Hold home hypertension medications tonight.  Anxiety and depression: Was prescribed buspirone 10 mg, Zoloft 25 mg and trazodone 50 mg but admits to not taking these medication. -Consider restarting these medications once patient is stable.  Hyperlipidemia CAD with remote history of CABG -Continue rosuvastatin 20 mg daily  CODE STATUS: Full code DIET: N.p.o. we will transition to carb modified tomorrow PPx: Full anticoagulation with Eliquis  Dispo: Admit patient to Inpatient with expected length of stay greater than 2 midnights.  Signed: Dellia Cloud, MD 02/26/2020, 6:02 PM  Pager: [phone] Internal Medicine Teaching Service After 5pm on weekdays and 1pm on weekends: On Call pager: [phone]

## 2020-02-26 NOTE — Progress Notes (Signed)
Orthopedic Tech Progress Note Patient Details:  Jason [name] May 08, 1950 [id number] Level 2 Trauma. Not needed Patient ID: Jason [name], male   DOB: 30-Dec-1950, 69 y.o.   MRN: [id number]   Jason [name] 02/26/2020, 2:00 PM

## 2020-02-27 ENCOUNTER — Other Ambulatory Visit (HOSPITAL_COMMUNITY): Payer: Medicare HMO

## 2020-02-27 ENCOUNTER — Inpatient Hospital Stay (HOSPITAL_COMMUNITY): Payer: Medicare HMO

## 2020-02-27 ENCOUNTER — Encounter (HOSPITAL_COMMUNITY): Payer: Self-pay | Admitting: Internal Medicine

## 2020-02-27 DIAGNOSIS — E785 Hyperlipidemia, unspecified: Secondary | ICD-10-CM

## 2020-02-27 DIAGNOSIS — R7881 Bacteremia: Secondary | ICD-10-CM

## 2020-02-27 DIAGNOSIS — E1100 Type 2 diabetes mellitus with hyperosmolarity without nonketotic hyperglycemic-hyperosmolar coma (NKHHC): Secondary | ICD-10-CM

## 2020-02-27 DIAGNOSIS — I4891 Unspecified atrial fibrillation: Secondary | ICD-10-CM

## 2020-02-27 DIAGNOSIS — I480 Paroxysmal atrial fibrillation: Secondary | ICD-10-CM | POA: Diagnosis present

## 2020-02-27 DIAGNOSIS — Z9181 History of falling: Secondary | ICD-10-CM

## 2020-02-27 DIAGNOSIS — F32A Depression, unspecified: Secondary | ICD-10-CM

## 2020-02-27 DIAGNOSIS — I2510 Atherosclerotic heart disease of native coronary artery without angina pectoris: Secondary | ICD-10-CM | POA: Diagnosis present

## 2020-02-27 DIAGNOSIS — E1110 Type 2 diabetes mellitus with ketoacidosis without coma: Principal | ICD-10-CM

## 2020-02-27 DIAGNOSIS — Z951 Presence of aortocoronary bypass graft: Secondary | ICD-10-CM

## 2020-02-27 DIAGNOSIS — I361 Nonrheumatic tricuspid (valve) insufficiency: Secondary | ICD-10-CM

## 2020-02-27 DIAGNOSIS — Z7901 Long term (current) use of anticoagulants: Secondary | ICD-10-CM

## 2020-02-27 DIAGNOSIS — F419 Anxiety disorder, unspecified: Secondary | ICD-10-CM

## 2020-02-27 DIAGNOSIS — I10 Essential (primary) hypertension: Secondary | ICD-10-CM

## 2020-02-27 DIAGNOSIS — R778 Other specified abnormalities of plasma proteins: Secondary | ICD-10-CM | POA: Diagnosis present

## 2020-02-27 DIAGNOSIS — R7989 Other specified abnormal findings of blood chemistry: Secondary | ICD-10-CM

## 2020-02-27 LAB — BASIC METABOLIC PANEL
Anion gap: 11 (ref 5–15)
Anion gap: 12 (ref 5–15)
Anion gap: 10 (ref 5–15)
Anion gap: 11 (ref 5–15)
BUN: 30 mg/dL — ABNORMAL HIGH (ref 8–23)
BUN: 30 mg/dL — ABNORMAL HIGH (ref 8–23)
BUN: 27 mg/dL — ABNORMAL HIGH (ref 8–23)
BUN: 23 mg/dL (ref 8–23)
CO2: 24 mmol/L (ref 22–32)
CO2: 22 mmol/L (ref 22–32)
CO2: 23 mmol/L (ref 22–32)
CO2: 19 mmol/L — ABNORMAL LOW (ref 22–32)
Calcium: 8.9 mg/dL (ref 8.9–10.3)
Calcium: 8.6 mg/dL — ABNORMAL LOW (ref 8.9–10.3)
Calcium: 8.4 mg/dL — ABNORMAL LOW (ref 8.9–10.3)
Calcium: 8.5 mg/dL — ABNORMAL LOW (ref 8.9–10.3)
Chloride: 106 mmol/L (ref 98–111)
Chloride: 105 mmol/L (ref 98–111)
Chloride: 108 mmol/L (ref 98–111)
Chloride: 108 mmol/L (ref 98–111)
Creatinine, Ser: 0.94 mg/dL (ref 0.61–1.24)
Creatinine, Ser: 1.02 mg/dL (ref 0.61–1.24)
Creatinine, Ser: 0.89 mg/dL (ref 0.61–1.24)
Creatinine, Ser: 0.69 mg/dL (ref 0.61–1.24)
GFR, Estimated: >60 mL/min (ref >60)
GFR, Estimated: >60 mL/min (ref >60)
GFR, Estimated: >60 mL/min (ref >60)
GFR, Estimated: >60 mL/min (ref >60)
Glucose, Bld: 184 mg/dL — ABNORMAL HIGH (ref 70–99)
Glucose, Bld: 319 mg/dL — ABNORMAL HIGH (ref 70–99)
Glucose, Bld: 225 mg/dL — ABNORMAL HIGH (ref 70–99)
Glucose, Bld: 145 mg/dL — ABNORMAL HIGH (ref 70–99)
Potassium: 4 mmol/L (ref 3.5–5.1)
Potassium: 4.1 mmol/L (ref 3.5–5.1)
Potassium: 3.5 mmol/L (ref 3.5–5.1)
Potassium: 4.5 mmol/L (ref 3.5–5.1)
Sodium: 141 mmol/L (ref 135–145)
Sodium: 139 mmol/L (ref 135–145)
Sodium: 141 mmol/L (ref 135–145)
Sodium: 138 mmol/L (ref 135–145)

## 2020-02-27 LAB — GLUCOSE, CAPILLARY
Glucose-Capillary: 161 mg/dL — ABNORMAL HIGH (ref 70–99)
Glucose-Capillary: 168 mg/dL — ABNORMAL HIGH (ref 70–99)
Glucose-Capillary: 173 mg/dL — ABNORMAL HIGH (ref 70–99)
Glucose-Capillary: 249 mg/dL — ABNORMAL HIGH (ref 70–99)
Glucose-Capillary: 317 mg/dL — ABNORMAL HIGH (ref 70–99)
Glucose-Capillary: 293 mg/dL — ABNORMAL HIGH (ref 70–99)
Glucose-Capillary: 320 mg/dL — ABNORMAL HIGH (ref 70–99)
Glucose-Capillary: 206 mg/dL — ABNORMAL HIGH (ref 70–99)
Glucose-Capillary: 213 mg/dL — ABNORMAL HIGH (ref 70–99)
Glucose-Capillary: 287 mg/dL — ABNORMAL HIGH (ref 70–99)
Glucose-Capillary: 113 mg/dL — ABNORMAL HIGH (ref 70–99)
Glucose-Capillary: 130 mg/dL — ABNORMAL HIGH (ref 70–99)
Glucose-Capillary: 150 mg/dL — ABNORMAL HIGH (ref 70–99)
Glucose-Capillary: 150 mg/dL — ABNORMAL HIGH (ref 70–99)
Glucose-Capillary: 251 mg/dL — ABNORMAL HIGH (ref 70–99)
Glucose-Capillary: 119 mg/dL — ABNORMAL HIGH (ref 70–99)
Glucose-Capillary: 150 mg/dL — ABNORMAL HIGH (ref 70–99)
Glucose-Capillary: 151 mg/dL — ABNORMAL HIGH (ref 70–99)
Glucose-Capillary: 145 mg/dL — ABNORMAL HIGH (ref 70–99)
Glucose-Capillary: 186 mg/dL — ABNORMAL HIGH (ref 70–99)

## 2020-02-27 LAB — URINALYSIS, ROUTINE W REFLEX MICROSCOPIC
Bilirubin Urine: NEGATIVE
Glucose, UA: >=500 mg/dL — AB
Hgb urine dipstick: NEGATIVE
Ketones, ur: 20 mg/dL — AB
Leukocytes,Ua: NEGATIVE
Nitrite: NEGATIVE
Protein, ur: NEGATIVE mg/dL
Specific Gravity, Urine: 1.031 — ABNORMAL HIGH (ref 1.005–1.030)
pH: 6 (ref 5.0–8.0)

## 2020-02-27 LAB — CBC
HCT: 43.3 % (ref 39.0–52.0)
Hemoglobin: 15.1 g/dL (ref 13.0–17.0)
MCH: 29.4 pg (ref 26.0–34.0)
MCHC: 34.9 g/dL (ref 30.0–36.0)
MCV: 84.4 fL (ref 80.0–100.0)
Platelets: 186 10*3/uL (ref 150–400)
RBC: 5.13 MIL/uL (ref 4.22–5.81)
RDW: 14.8 % (ref 11.5–15.5)
WBC: 13.3 10*3/uL — ABNORMAL HIGH (ref 4.0–10.5)
nRBC: 0 % (ref 0.0–0.2)

## 2020-02-27 LAB — TROPONIN I (HIGH SENSITIVITY)
Troponin I (High Sensitivity): 943 ng/L (ref <18)
Troponin I (High Sensitivity): 964 ng/L (ref <18)

## 2020-02-27 LAB — BETA-HYDROXYBUTYRIC ACID
Beta-Hydroxybutyric Acid: 1.28 mmol/L — ABNORMAL HIGH (ref 0.05–0.27)
Beta-Hydroxybutyric Acid: 3.51 mmol/L — ABNORMAL HIGH (ref 0.05–0.27)

## 2020-02-27 LAB — ECHOCARDIOGRAM COMPLETE
AR max vel: 3.14 cm2
AV Area VTI: 3.54 cm2
AV Area mean vel: 2.56 cm2
AV Mean grad: 2 mmHg
AV Peak grad: 3.4 mmHg
Ao pk vel: 0.92 m/s
Height: 70 in
S' Lateral: 2.8 cm
Weight: 3840 oz

## 2020-02-27 LAB — CK: Total CK: 109 U/L (ref 49–397)

## 2020-02-27 MED ORDER — METOPROLOL TARTRATE 25 MG PO TABS
25.0000 mg | ORAL_TABLET | Freq: Once | ORAL | Status: AC
  Administered 2020-02-27: 25 mg via ORAL
  Filled 2020-02-27: qty 1

## 2020-02-27 MED ORDER — PERFLUTREN LIPID MICROSPHERE
1.0000 mL | INTRAVENOUS | Status: AC | PRN
  Administered 2020-02-27: 4 mL via INTRAVENOUS
  Filled 2020-02-27: qty 10

## 2020-02-27 MED ORDER — METOPROLOL TARTRATE 50 MG PO TABS: 50.0000 mg | ORAL_TABLET | Freq: Once | ORAL | Status: DC

## 2020-02-27 MED ORDER — METOPROLOL TARTRATE 50 MG PO TABS
50.0000 mg | ORAL_TABLET | Freq: Two times a day (BID) | ORAL | Status: DC
  Administered 2020-02-27: 50 mg via ORAL
  Filled 2020-02-27: qty 1

## 2020-02-27 MED ORDER — LACTATED RINGERS IV BOLUS
1000.0000 mL | Freq: Once | INTRAVENOUS | Status: AC
  Administered 2020-02-27: 1000 mL via INTRAVENOUS

## 2020-02-27 MED ORDER — POTASSIUM PHOSPHATES 15 MMOLE/5ML IV SOLN
15.0000 mmol | Freq: Once | INTRAVENOUS | Status: AC
  Administered 2020-02-27: 15 mmol via INTRAVENOUS
  Filled 2020-02-27: qty 5

## 2020-02-27 MED ORDER — METOPROLOL TARTRATE 5 MG/5ML IV SOLN
5.0000 mg | INTRAVENOUS | Status: DC | PRN
  Administered 2020-02-27 (×3): 5 mg via INTRAVENOUS
  Filled 2020-02-27 (×2): qty 5

## 2020-02-27 MED ORDER — POTASSIUM CHLORIDE 10 MEQ/100ML IV SOLN
10.0000 meq | INTRAVENOUS | Status: AC
  Administered 2020-02-27 (×5): 10 meq via INTRAVENOUS
  Filled 2020-02-27 (×5): qty 100

## 2020-02-27 MED ORDER — METOPROLOL TARTRATE 5 MG/5ML IV SOLN
5.0000 mg | Freq: Once | INTRAVENOUS | Status: AC
  Administered 2020-02-27: 5 mg via INTRAVENOUS
  Filled 2020-02-27: qty 5

## 2020-02-27 MED ORDER — METOPROLOL TARTRATE 25 MG PO TABS: 25.0000 mg | ORAL_TABLET | Freq: Two times a day (BID) | ORAL | Status: DC

## 2020-02-27 MED ORDER — HYDROMORPHONE HCL 1 MG/ML IJ SOLN
0.2500 mg | INTRAMUSCULAR | Status: DC | PRN
  Administered 2020-02-27 – 2020-03-06 (×3): 0.25 mg via INTRAVENOUS
  Filled 2020-02-27 (×3): qty 1

## 2020-02-27 MED ORDER — METOPROLOL TARTRATE 50 MG PO TABS
50.0000 mg | ORAL_TABLET | Freq: Once | ORAL | Status: DC
  Filled 2020-02-27: qty 1

## 2020-02-27 MED ORDER — ORAL CARE MOUTH RINSE
15.0000 mL | Freq: Two times a day (BID) | OROMUCOSAL | Status: DC
  Administered 2020-02-27 – 2020-03-08 (×19): 15 mL via OROMUCOSAL

## 2020-02-27 NOTE — Progress Notes (Signed)
   02/26/20 2200  Assess: MEWS Score  BP 122/77  Pulse Rate (!) 139  ECG Heart Rate (!) 140  Resp (!) 26  Level of Consciousness Alert  SpO2 97 %  O2 Device Room Air  Assess: MEWS Score  MEWS Temp 0  MEWS Systolic 0  MEWS Pulse 3  MEWS RR 2  MEWS LOC 0  MEWS Score 5  MEWS Score Color Red  Assess: if the MEWS score is Yellow or Red  Were vital signs taken at a resting state? Yes  Focused Assessment No change from prior assessment  Early Detection of Sepsis Score *See Row Information* Medium  MEWS guidelines implemented *See Row Information* Yes  Treat  MEWS Interventions Administered scheduled meds/treatments;Escalated (See documentation below)  Pain Scale 0-10  Pain Score 0 ("I just want water")  Take Vital Signs  Increase Vital Sign Frequency  Red: Q 1hr X 4 then Q 4hr X 4, if remains red, continue Q 4hrs  Escalate  MEWS: Escalate Red: discuss with charge nurse/RN and provider, consider discussing with RRT  Notify: Charge Nurse/RN  Name of Charge Nurse/RN Notified Jill, RN  Date Charge Nurse/RN Notified 02/26/20  Time Charge Nurse/RN Notified 2356  Notify: Provider  Provider Name/Title Marijo Conception, MD  Date Provider Notified 02/26/20  Time Provider Notified 2357  Notification Type Page  Notification Reason Other (Comment) (Red MEWS, high HR)  Response See new orders  Date of Provider Response 02/27/20  Time of Provider Response [id number]  Document  Patient Outcome Other (Comment) (stable in department)  Progress note created (see row info) Yes

## 2020-02-27 NOTE — Plan of Care (Signed)
Patient remains lethargic, reinforcement of teaching needed.

## 2020-02-27 NOTE — Progress Notes (Signed)
Subjective:   Hospital day: 1  Overnight event: No acute events overnight  This morning, patient had a one-to-one sitter in his room.  Patient states he continues to be thirsty but denies any chest pain, shortness of breath.  Patient states that he does not remember how long he was on the floor but was likely down for days.  Objective:  Vital signs in last 24 hours: Vitals:   02/27/20 1200 02/27/20 1300 02/27/20 1400 02/27/20 1409  BP: 104/65 117/84 101/72 111/85  Pulse: (!) 133 (!) 132 (!) 133 (!) 133  Resp: (!) 23 (!) 23 (!) 21 (!) 25  Temp: (!) 97.4 F (36.3 C)     TempSrc: Axillary     SpO2: 96% 98% 97% 98%  Weight:      Height:        Physical Exam  General:  Ill-appearing elderly man laying in bed. No acute distress. Head: Normocephalic. Atraumatic. HEENT: Dry mucous membrane CV: Tachycardic.  Irregularly irregular rhythm. No murmurs, rubs, or gallops. No LE edema Pulmonary: Lungs CTAB. Normal effort. No wheezing or rales. Abdominal: Soft, nontender, nondistended. Normal bowel sounds. Extremities: Palpable pulses. Normal ROM.  Amputated right hallux Skin: Warm and dry. No obvious rash or lesions. Decreased skin turgor Neuro:  A little somnolent this morning.  A&Ox3. Moves all extremities. Normal sensation. No focal deficit. Psych: Normal mood and affect  Assessment/Plan: Nechemia Chiappetta is a 69 y.o. male with PMH of T2DM, HTN anxiety/depression, previous CVA, CAD s/p CABG  who presented to the [facility] after a fall and was admitted for hypoglycemia secondary to medication nonadherence.  Patient currently being treated for mixed picture of DKA/HHS.  Active Problems:   Hyperosmolar hyperglycemic state (HHS) (HCC)   DKA, type 2 (HCC)   Paroxysmal A-fib (HCC)   Elevated troponin   CAD (coronary artery disease), native coronary artery  DKA/HHS Patient was found to have hyperglycemia and AGMA on arrival.  Patient's lab findings and history consistent with a mixed picture of  DKA and HHS. Anion gap has resolved.  Blood sugar trending down. Patient with dysphagia 3 from speech eval.  He continues to be lethargic and currently not ready to eat. We will continue IV insulin infusion for now. --Continue aggressive IVF resuscitation --Continue IV insulin infusion --Switch to subcu when able to eat --Continue potassium supplementation --CBG monitoring --BMP q4h --Patient would need education on how to use insulin  A. Fib Patient recently diagnosed with A. fib and started on metoprolol for rate control and Eliquis for 1 cycle regulation. Patient found to be in A. fib with RVR.  Patient extremely dehydrated which is likely playing a part in his sustained A. fib and tachycardia. Patient continues to be tachycardic to the 130s.  Heart rate remains well controlled even after multiple doses beta-blockers.  Will check echo and consider diltiazem for rate control. --Continue Eliquis 5 mg twice daily --Restarted home metoprolol 50 mg twice daily --Telemetry --Continue IVF --TTE pending  Elevated troponin Patient was found to have troponin elevation to the 900s overnight.  EKG showed some ST wave abnormalities.  Likely secondary to demand ischemia in the setting of stress from DKA/HHS.  Does have a history of CAD but denies any chest pain. --Echocardiogram --Telemetry --Trend troponin  Fall Patient with a history of falls presented after falling and laying down for an unknown duration.  Patient reports hitting head with CT head negative for any acute intracranial abnormalities. --Fall precautions --Continue low-dose Dilaudid as needed  for pain --PT/OT eval  HTN Patient has a history of hypertension on amlodipine, Benzapril, hydralazine and metoprolol.  All antihypertensives were held on admission.  BP today with systolic in the 100s and 110s.  We will continue to monitor closely. --Restarted metoprolol 50 mg twice daily --Frequent vitals  CAD Hyperlipidemia Patient has  a history of CAD status post CABG more than 20 years ago.  Initially started on aspirin and Plavix. Aspirin has been discontinued years ago. Plavix discontinued after recent diagnosis of A. Fib. --Continue rosuvastatin 20 mg daily  Anxiety/depression  Patient with worsening depression due to isolation.  Reported suicidal ideation on admission, stating that he has no resources at home, he is alone and has not been able to take care of himself.  Patient is prescribed buspirone 10 mg, Zoloft 25 mg and trazodone 50 mg however patient does not take these medications. --Plan to restart meds once stable  Diet: Carb modified IVF: IV LR VTE: Eliquis CODE: Full code  Prior to Admission Living Arrangement: Home Anticipated Discharge Location: Pending Barriers to Discharge: Medical work-up Dispo: Anticipated discharge in approximately 1-2 day(s).   Signed: Steffanie Rainwater, MD 02/27/2020, 2:12 PM  Pager: [phone] Internal Medicine Teaching Service After 5pm on weekdays and 1pm on weekends: On Call pager: [phone]

## 2020-02-27 NOTE — Progress Notes (Signed)
  Echocardiogram 2D Echocardiogram has been performed.  Gerda Diss 02/27/2020, 3:34 PM

## 2020-02-27 NOTE — Progress Notes (Signed)
Inpatient Diabetes Program Recommendations  AACE/ADA: New Consensus Statement on Inpatient Glycemic Control (2015)  Target Ranges:  Prepandial:   less than 140 mg/dL      Peak postprandial:   less than 180 mg/dL (1-2 hours)      Critically ill patients:  140 - 180 mg/dL   Results for Jason [name], Jason [name] (MRN [id number]) as of 02/27/2020 14:13  Ref. Range 02/26/2020 14:00  Sodium Latest Ref Range: 135 - 145 mmol/L 135  Potassium Latest Ref Range: 3.5 - 5.1 mmol/L 5.6 (H)  Chloride Latest Ref Range: 98 - 111 mmol/L 94 (L)  CO2 Latest Ref Range: 22 - 32 mmol/L 12 (L)  Glucose Latest Ref Range: 70 - 99 mg/dL 546 (HH)  BUN Latest Ref Range: 8 - 23 mg/dL 37 (H)  Creatinine Latest Ref Range: 0.61 - 1.24 mg/dL 5.68 (H)  Calcium Latest Ref Range: 8.9 - 10.3 mg/dL 12.7 (H)  Anion gap Latest Ref Range: 5 - 15  29 (H)   Results for Jason [name], Jason [name] (MRN [id number]) as of 02/27/2020 14:13  Ref. Range 02/27/2020 07:01 02/27/2020 07:56 02/27/2020 08:03 02/27/2020 08:59 02/27/2020 09:59 02/27/2020 10:57 02/27/2020 12:08 02/27/2020 13:02  Glucose-Capillary Latest Ref Range: 70 - 99 mg/dL 449 (H)  IV Insulin Drip 206 (H)  IV Insulin Drip 213 (H)  IV Insulin Drip 150 (H)  IV Insulin Drip 130 (H)  IV Insulin Drip 113 (H)  IV Insulin Drip 150 (H)  IV Insulin Drip 251 (H)  IV Insulin Drip    Admit with: DKA/ Fall at home/ A Fib  History: DM  Home DM Meds: Basaglar 30 units Daily       Humalog 2-12 units TID       (Has Not been taking per H&P note)  Current Orders: IV Insulin Drip    7:51am BMET shows the following: Glucose 225 CO2 level= 23 Anion Gap= 10  1:41pm BMET pending   MD- If recent BMET OK, may be able to transition to SQ Insulin.  Please consider the following when decision made to transition to SQ Insulin:  1. Start Lantus 25 units Daily (Please make sure pt given Lantus insulin at least 1 hour prior to d/c of the IV Insulin Drip) [0.25 units/kg]  2. Start Novolog Moderate  Correction Scale/ SSI (0-15 units) TID AC + HS   Will have diabetes coordinator follow up with patient on Monday to assess his issues with taking insulin at home    --Will follow patient during hospitalization--  Ambrose Finland RN, MSN, CDE Diabetes Coordinator Inpatient Glycemic Control Team Team Pager: [phone] (8a-5p)

## 2020-02-27 NOTE — Progress Notes (Signed)
Patient alert and oriented to self, situation, and easily reoriented to location and time. He was able to converse with me about his living situation and how he wants to transition to an assisted living or skilled nursing facility, because he lives alone and cannot do very much for himself anymore. He reports three children, but his son [name] is his primary point of contact and emergency decisionmaker. He reports that [name] is a truck driver and is not home very often, and between his job and family obligations, he isn't able to provide the care that the patient now requires. The patient reports that he has been trying for a long time to be admitted to a facility but has been declined several times. He asked me to contact his son and let him know what was going on.  Per the patient's request, I placed a phone call to Janan Ridge, son and had a conversation with him. [name] confirms what the patient reported and states they've been trying for months to get him admitted to a facility because the patient cannot live by himself anymore. He reports that he is only home once a week, and that his wife is disabled and they have 6 children to care for. He states that the patient has been depressed because he does not want to live alone and is not able to care for himself adequately, which is what led to the current suicidal ideation. Will advise the day shift nurse to follow up with case management for the patient for placement.

## 2020-02-27 NOTE — Progress Notes (Signed)
PT Cancellation Note  Patient Details Name: Jason [name] MRN: [id number] DOB: Jun 16, 1950   Cancelled Treatment:    Reason Eval/Treat Not Completed: Active bedrest order Will await increase in activity orders prior to PT evaluation. Will follow.   Blake Divine A Tanya Marvin 02/27/2020, 7:14 AM [place], PT, DPT Acute Rehabilitation Services Pager [phone] Office [phone]

## 2020-02-27 NOTE — Progress Notes (Signed)
Reviewed patient medical and surgical history.  Patient able to answer some questions, but poor historian.  States he has son [name] who lives in [place], but unable to remember phone number. Patient states he lives alone and is having trouble taking care of residence.  Will place [facility] consult.

## 2020-02-27 NOTE — Evaluation (Signed)
Clinical/Bedside Swallow Evaluation Patient Details  Name: Jason [name] MRN: [id number] Date of Birth: 28-Feb-1951  Today's Date: 02/27/2020 Time: SLP Start Time (ACUTE ONLY): 0940 SLP Stop Time (ACUTE ONLY): 0958 SLP Time Calculation (min) (ACUTE ONLY): 18 min  Past Medical History: History reviewed. No pertinent past medical history. Past Surgical History: History reviewed. No pertinent surgical history. HPI:  Pt is a 69 y.o. male with a medical history of T2DM, HTN anxiety/depression, previous CVA, CAD s/p CABG who presented to the ED after recent fall. Pt had associated dry mouth, polyuria, polydipsia, with some nausea and vomiting. CXR 12/11: No acute cardiopulmonary process. CT chest: Linear, parenchymal bands are noted within both lower lobes, likely the sequelae of chronic inflammation or infection. No acute airspace consolidation, atelectasis or pneumothorax identified. Pt was made NPO on admission with plan (Per Dr. Perfecto Kingdom note) to transition to carb modified on 12/12.   Assessment / Plan / Recommendation Clinical Impression  Pt was seen for bedside swallow evaluation and he denied a history of dysphagia, but stated that he consumes soft solids due to his reduced dentition. Oral mechanism exam was limited due to pt's difficulty following some commands; however, oral motor strength and ROM appeared grossly WFL. Dentition was limited with multiple absent molars. He tolerated all solids and up to 4oz of thin liquids without signs or symptoms of aspiration. Mastication was prolonged with regular textures secondary to dentition but functional with dysphagia 3 solids. A dysphagia 3 diet with thin liquids is recommended at this time and SLP will follow briefly to assess diet tolerance. SLP Visit Diagnosis: Dysphagia, unspecified (R13.10)    Aspiration Risk  Mild aspiration risk    Diet Recommendation Dysphagia 3 (Mech soft);Thin liquid   Liquid Administration via: Cup;Straw Medication  Administration: Whole meds with puree Compensations: Slow rate;Small sips/bites Postural Changes: Seated upright at 90 degrees    Other  Recommendations Oral Care Recommendations: Oral care BID   Follow up Recommendations  (TBD)      Frequency and Duration min 1 x/week  1 week       Prognosis Prognosis for Safe Diet Advancement: Good Barriers to Reach Goals: Cognitive deficits      Swallow Study   General Date of Onset: 02/26/20 HPI: Pt is a 69 y.o. male with a medical history of T2DM, HTN anxiety/depression, previous CVA, CAD s/p CABG who presented to the ED after recent fall. Pt had associated dry mouth, polyuria, polydipsia, with some nausea and vomiting. CXR 12/11: No acute cardiopulmonary process. CT chest: Linear, parenchymal bands are noted within both lower lobes, likely the sequelae of chronic inflammation or infection. No acute airspace consolidation, atelectasis or pneumothorax identified. Pt was made NPO on admission with plan (Per Dr. Perfecto Kingdom note) to transition to carb modified on 12/12. Type of Study: Bedside Swallow Evaluation Previous Swallow Assessment: None Diet Prior to this Study: NPO Temperature Spikes Noted: No Respiratory Status: Room air History of Recent Intubation: No Behavior/Cognition: Alert;Cooperative;Pleasant mood Oral Cavity Assessment: Within Functional Limits Oral Care Completed by SLP: No Vision: Functional for self-feeding Patient Positioning: Upright in bed;Postural control adequate for testing Baseline Vocal Quality: Normal Volitional Cough: Weak Volitional Swallow: Able to elicit    Oral/Motor/Sensory Function Overall Oral Motor/Sensory Function:  (Pt exhibited difficulty following commands)   Ice Chips Ice chips: Within functional limits Presentation: Spoon   Thin Liquid Thin Liquid: Within functional limits Presentation: Straw;Cup;Spoon Other Comments: `    Nectar Thick Nectar Thick Liquid: Not tested   Honey Thick  Honey Thick  Liquid: Not tested   Puree Puree: Within functional limits Presentation: Spoon   Solid     Solid: Impaired Oral Phase Impairments: Impaired mastication Oral Phase Functional Implications: Impaired mastication     Lynnwood Beckford I. Vear Clock, MS, CCC-SLP Acute Rehabilitation Services Office number [phone] Pager [phone]  Scheryl Marten 02/27/2020,10:06 AM

## 2020-02-28 LAB — COMPREHENSIVE METABOLIC PANEL
ALT: 11 U/L (ref 0–44)
AST: 18 U/L (ref 15–41)
Albumin: 2.1 g/dL — ABNORMAL LOW (ref 3.5–5.0)
Alkaline Phosphatase: 123 U/L (ref 38–126)
Anion gap: 7 (ref 5–15)
BUN: 9 mg/dL (ref 8–23)
CO2: 25 mmol/L (ref 22–32)
Calcium: 8 mg/dL — ABNORMAL LOW (ref 8.9–10.3)
Chloride: 103 mmol/L (ref 98–111)
Creatinine, Ser: 0.48 mg/dL — ABNORMAL LOW (ref 0.61–1.24)
GFR, Estimated: >60 mL/min (ref >60)
Glucose, Bld: 170 mg/dL — ABNORMAL HIGH (ref 70–99)
Potassium: 3.6 mmol/L (ref 3.5–5.1)
Sodium: 135 mmol/L (ref 135–145)
Total Bilirubin: 0.8 mg/dL (ref 0.3–1.2)
Total Protein: 4.3 g/dL — ABNORMAL LOW (ref 6.5–8.1)

## 2020-02-28 LAB — GLUCOSE, CAPILLARY
Glucose-Capillary: 109 mg/dL — ABNORMAL HIGH (ref 70–99)
Glucose-Capillary: 146 mg/dL — ABNORMAL HIGH (ref 70–99)
Glucose-Capillary: 150 mg/dL — ABNORMAL HIGH (ref 70–99)
Glucose-Capillary: 159 mg/dL — ABNORMAL HIGH (ref 70–99)
Glucose-Capillary: 164 mg/dL — ABNORMAL HIGH (ref 70–99)
Glucose-Capillary: 174 mg/dL — ABNORMAL HIGH (ref 70–99)
Glucose-Capillary: 206 mg/dL — ABNORMAL HIGH (ref 70–99)
Glucose-Capillary: 144 mg/dL — ABNORMAL HIGH (ref 70–99)
Glucose-Capillary: 151 mg/dL — ABNORMAL HIGH (ref 70–99)
Glucose-Capillary: 153 mg/dL — ABNORMAL HIGH (ref 70–99)
Glucose-Capillary: 169 mg/dL — ABNORMAL HIGH (ref 70–99)
Glucose-Capillary: 241 mg/dL — ABNORMAL HIGH (ref 70–99)
Glucose-Capillary: 258 mg/dL — ABNORMAL HIGH (ref 70–99)
Glucose-Capillary: 300 mg/dL — ABNORMAL HIGH (ref 70–99)

## 2020-02-28 LAB — CBC WITH DIFFERENTIAL/PLATELET
Abs Immature Granulocytes: 0.05 10*3/uL (ref 0.00–0.07)
Basophils Absolute: 0 10*3/uL (ref 0.0–0.1)
Basophils Relative: 1 %
Eosinophils Absolute: 0 10*3/uL (ref 0.0–0.5)
Eosinophils Relative: 0 %
HCT: 37.5 % — ABNORMAL LOW (ref 39.0–52.0)
Hemoglobin: 12 g/dL — ABNORMAL LOW (ref 13.0–17.0)
Immature Granulocytes: 1 %
Lymphocytes Relative: 15 %
Lymphs Abs: 0.9 10*3/uL (ref 0.7–4.0)
MCH: 28.2 pg (ref 26.0–34.0)
MCHC: 32 g/dL (ref 30.0–36.0)
MCV: 88.2 fL (ref 80.0–100.0)
Monocytes Absolute: 0.6 10*3/uL (ref 0.1–1.0)
Monocytes Relative: 10 %
Neutro Abs: 4.3 10*3/uL (ref 1.7–7.7)
Neutrophils Relative %: 73 %
Platelets: 141 10*3/uL — ABNORMAL LOW (ref 150–400)
RBC: 4.25 MIL/uL (ref 4.22–5.81)
RDW: 14.8 % (ref 11.5–15.5)
WBC: 5.9 10*3/uL (ref 4.0–10.5)
nRBC: 0 % (ref 0.0–0.2)

## 2020-02-28 LAB — PHOSPHORUS: Phosphorus: 2.1 mg/dL — ABNORMAL LOW (ref 2.5–4.6)

## 2020-02-28 MED ORDER — INSULIN ASPART 100 UNIT/ML ~~LOC~~ SOLN
0.0000 [IU] | Freq: Three times a day (TID) | SUBCUTANEOUS | Status: DC
  Administered 2020-02-28: 5 [IU] via SUBCUTANEOUS
  Administered 2020-02-28: 8 [IU] via SUBCUTANEOUS
  Administered 2020-02-29 (×3): 11 [IU] via SUBCUTANEOUS
  Administered 2020-03-01: 3 [IU] via SUBCUTANEOUS
  Administered 2020-03-01 (×2): 5 [IU] via SUBCUTANEOUS
  Administered 2020-03-02: 3 [IU] via SUBCUTANEOUS
  Administered 2020-03-02: 15 [IU] via SUBCUTANEOUS
  Administered 2020-03-03: 11 [IU] via SUBCUTANEOUS
  Administered 2020-03-03: 8 [IU] via SUBCUTANEOUS
  Administered 2020-03-03: 2 [IU] via SUBCUTANEOUS
  Administered 2020-03-04: 3 [IU] via SUBCUTANEOUS
  Administered 2020-03-04: 2 [IU] via SUBCUTANEOUS
  Administered 2020-03-04: 3 [IU] via SUBCUTANEOUS
  Administered 2020-03-05: 2 [IU] via SUBCUTANEOUS
  Administered 2020-03-05 – 2020-03-06 (×2): 3 [IU] via SUBCUTANEOUS
  Administered 2020-03-06: 2 [IU] via SUBCUTANEOUS
  Administered 2020-03-06 – 2020-03-07 (×2): 3 [IU] via SUBCUTANEOUS
  Administered 2020-03-07 (×2): 2 [IU] via SUBCUTANEOUS
  Administered 2020-03-08: 3 [IU] via SUBCUTANEOUS

## 2020-02-28 MED ORDER — METOPROLOL TARTRATE 25 MG PO TABS
25.0000 mg | ORAL_TABLET | Freq: Once | ORAL | Status: AC
  Administered 2020-02-28: 25 mg via ORAL

## 2020-02-28 MED ORDER — METOPROLOL TARTRATE 25 MG PO TABS
25.0000 mg | ORAL_TABLET | Freq: Once | ORAL | Status: AC
  Administered 2020-02-28: 25 mg via ORAL
  Filled 2020-02-28: qty 1

## 2020-02-28 MED ORDER — METOPROLOL TARTRATE 50 MG PO TABS
75.0000 mg | ORAL_TABLET | Freq: Two times a day (BID) | ORAL | Status: DC
  Administered 2020-02-28 (×2): 75 mg via ORAL
  Filled 2020-02-28 (×3): qty 1

## 2020-02-28 MED ORDER — INSULIN GLARGINE 100 UNIT/ML ~~LOC~~ SOLN
25.0000 [IU] | Freq: Every day | SUBCUTANEOUS | Status: DC
  Administered 2020-02-28 – 2020-02-29 (×2): 25 [IU] via SUBCUTANEOUS
  Filled 2020-02-28 (×3): qty 0.25

## 2020-02-28 NOTE — NC FL2 (Signed)
[place] MEDICAID FL2 LEVEL OF CARE SCREENING TOOL     IDENTIFICATION  Patient Name: Jason [name] Birthdate: 1951-03-04 Sex: male Admission Date (Current Location): 02/26/2020  [facility] and [place] Number:  Producer, television/film/video and Address:  [facility] [place], [address] [place], [place] [address]      Provider Number: [id number]  Attending Physician Name and Address:  Gust Rung, DO  Relative Name and Phone Number:  [name] [phone]    Current Level of Care: Hospital Recommended Level of Care: Skilled Nursing Facility Prior Approval Number:    Date Approved/Denied:   PASRR Number: [id number]  Discharge Plan: SNF    Current Diagnoses: Patient Active Problem List   Diagnosis Date Noted  . DKA, type 2 (HCC) 02/27/2020  . Paroxysmal A-fib (HCC) 02/27/2020  . Elevated troponin 02/27/2020  . CAD (coronary artery disease), native coronary artery 02/27/2020  . Hyperosmolar hyperglycemic state (HHS) (HCC) 02/26/2020  . Essential hypertension 10/29/2019  . Type 2 diabetes mellitus (HCC) 10/29/2019    Orientation RESPIRATION BLADDER Height & Weight     Self,Situation  O2 (Nasal cannula 2 liters) Incontinent,External catheter (External Urinary Catheter) Weight: 240 lb (108.9 kg) Height:  5\' 10"  (177.8 cm)  BEHAVIORAL SYMPTOMS/MOOD NEUROLOGICAL BOWEL NUTRITION STATUS      Incontinent Diet (See Discharge Summary)  AMBULATORY STATUS COMMUNICATION OF NEEDS Skin   Extensive Assist Verbally Skin abrasions,Other (Comment) (Abrasion arm;leg;foot;ankle;thigh;bilateral,amputation toe;great toe;Right)                       Personal Care Assistance Level of Assistance  Bathing,Feeding,Dressing Bathing Assistance: Limited assistance Feeding assistance: Limited assistance (Needs assist;NPO Mechanical soft) Dressing Assistance: Limited assistance     Functional Limitations Info  Sight,Hearing,Speech Sight Info: Impaired Hearing Info:  Adequate Speech Info: Adequate    SPECIAL CARE FACTORS FREQUENCY  PT (By licensed PT),OT (By licensed OT)     PT Frequency: 5x min weekly OT Frequency: 5x min weekly            Contractures Contractures Info: Not present    Additional Factors Info  Code Status,Insulin Sliding Scale Code Status Info: FULL     Insulin Sliding Scale Info: insulin aspart (novoLOG) injection 0-15 Units 3 times daily with meals,insulin glargine (LANTUS) injection 25 Units daily,       Current Medications (02/28/2020):  This is the current hospital active medication list Current Facility-Administered Medications  Medication Dose Route Frequency Provider Last Rate Last Admin  . acetaminophen (TYLENOL) tablet 650 mg  650 mg Oral Q6H PRN 03/01/2020, MD   650 mg at 02/27/20 1002   Or  . acetaminophen (TYLENOL) suppository 650 mg  650 mg Rectal Q6H PRN 14/12/21, MD      . apixaban Dellia Cloud) tablet 5 mg  5 mg Oral BID Everlene Balls, RPH   5 mg at 02/28/20 0957  . dextrose 5 % in lactated ringers infusion   Intravenous Continuous 03/01/20, MD   Stopped at 02/28/20 1200  . dextrose 50 % solution 0-50 mL  0-50 mL Intravenous PRN 02-27-1974, MD      . HYDROmorphone (DILAUDID) injection 0.25 mg  0.25 mg Intravenous Q4H PRN Dellia Cloud, MD   0.25 mg at 02/27/20 1705  . insulin aspart (novoLOG) injection 0-15 Units  0-15 Units Subcutaneous TID WC 05-17-1984, MD   8 Units at 02/28/20 1219  . insulin glargine (LANTUS) injection 25 Units  25 Units  Subcutaneous Daily Steffanie Rainwater, MD   25 Units at 02/28/20 [phone]  . insulin regular, human (MYXREDLIN) 100 units/ 100 mL infusion   Intravenous Continuous Dellia Cloud, MD   Stopped at 02/28/20 1200  . MEDLINE mouth rinse  15 mL Mouth Rinse BID Carlynn Purl C, DO   15 mL at 02/28/20 0958  . metoprolol tartrate (LOPRESSOR) tablet 75 mg  75 mg Oral BID Steffanie Rainwater, MD   75 mg at 02/28/20 0957  . rosuvastatin (CRESTOR)  tablet 20 mg  20 mg Oral Daily Dellia Cloud, MD   20 mg at 02/28/20 0957  . sodium chloride flush (NS) 0.9 % injection 3 mL  3 mL Intravenous Q12H Dellia Cloud, MD   3 mL at 02/28/20 4287     Discharge Medications: Please see discharge summary for a list of discharge medications.  Relevant Imaging Results:  Relevant Lab Results:   Additional Information [id number]  Terrial Rhodes, LCSWA

## 2020-02-28 NOTE — TOC Initial Note (Signed)
Transition of Care [facility]) - Initial/Assessment Note    Patient Details  Name: Jason [name] MRN: [id number] Date of Birth: 11-20-1950  [facility] CM/SW Contact:    Terrial Rhodes, LCSWA Phone Number: 02/28/2020, 3:56 PM  Clinical Narrative:                  CSW received consult for possible SNF placement at time of discharge. CSW spoke with patients son Jason [name] regarding PT recommendation of SNF placement at time of discharge. Patient comes from home alone.  Patients son expressed understanding of PT recommendation and is agreeable to SNF placement for patient at time of discharge. Patients sons first choice for patient would be [place]. Patients son gave CSW permission to fax out initial referral to [employer] and [place] area.  Patient has received the COVID vaccines. No further questions reported at this time. CSW to continue to follow and assist with discharge planning needs.   Expected Discharge Plan: Skilled Nursing Facility Barriers to Discharge: Continued Medical Work up   Patient Goals and CMS Choice   CMS Medicare.gov Compare Post Acute Care list provided to:: Patient Represenative (must comment) (son Jason [name]) Choice offered to / list presented to : Adult Children Jason [name])  Expected Discharge Plan and Services Expected Discharge Plan: Skilled Nursing Facility       Living arrangements for the past 2 months: Single Family Home                                      Prior Living Arrangements/Services Living arrangements for the past 2 months: Single Family Home Lives with:: Self Patient language and need for interpreter reviewed:: Yes Do you feel safe going back to the place where you live?: No   SNF  Need for Family Participation in Patient Care: Yes (Comment) Care giver support system in place?: Yes (comment)   Criminal Activity/Legal Involvement Pertinent to Current Situation/Hospitalization: No - Comment as needed  Activities of Daily  Living Home Assistive Devices/Equipment: Walker (specify type) ADL Screening (condition at time of admission) Patient's cognitive ability adequate to safely complete daily activities?: No Is the patient deaf or have difficulty hearing?: No Does the patient have difficulty seeing, even when wearing glasses/contacts?: No Does the patient have difficulty concentrating, remembering, or making decisions?: Yes Patient able to express need for assistance with ADLs?: Yes Does the patient have difficulty dressing or bathing?: Yes Independently performs ADLs?: No Communication: Independent Dressing (OT): Needs assistance Is this a change from baseline?: Change from baseline, expected to last >3 days Grooming: Needs assistance Is this a change from baseline?: Change from baseline, expected to last >3 days Feeding: Needs assistance Is this a change from baseline?: Change from baseline, expected to last >3 days Bathing: Dependent Is this a change from baseline?: Change from baseline, expected to last >3 days Toileting: Needs assistance Is this a change from baseline?: Change from baseline, expected to last >3days In/Out Bed: Needs assistance Is this a change from baseline?: Change from baseline, expected to last >3 days Walks in Home: Needs assistance Is this a change from baseline?: Change from baseline, expected to last <3 days Does the patient have difficulty walking or climbing stairs?: Yes Weakness of Legs: Both Weakness of Arms/Hands: Both  Permission Sought/Granted Permission sought to share information with : Case Manager,Family Electrical engineer       Permission granted to share  info w AGENCY: SNF        Emotional Assessment       Orientation: : Oriented to Self,Oriented to Situation Alcohol / Substance Use: Not Applicable Psych Involvement: No (comment)  Admission diagnosis:  SOB (shortness of breath) [R06.02] Atrial flutter with rapid ventricular  response (HCC) [I48.92] Closed fracture of one rib of left side, initial encounter [S22.32XA] Hyperosmolar hyperglycemic state (HHS) (HCC) [E11.00, E11.65] Patient Active Problem List   Diagnosis Date Noted  . DKA, type 2 (HCC) 02/27/2020  . Paroxysmal A-fib (HCC) 02/27/2020  . Elevated troponin 02/27/2020  . CAD (coronary artery disease), native coronary artery 02/27/2020  . Hyperosmolar hyperglycemic state (HHS) (HCC) 02/26/2020  . Essential hypertension 10/29/2019  . Type 2 diabetes mellitus (HCC) 10/29/2019   PCP:  Default, Provider, MD Pharmacy:   [facility] - [place] [address], [facility] [place] [address] Phone: [phone] Fax: [phone]  [facility] [place], [place] - [address] [place] [address] Phone: [phone] Fax: [phone]     Social Determinants of Health (SDOH) Interventions    Readmission Risk Interventions No flowsheet data found.

## 2020-02-28 NOTE — Progress Notes (Signed)
   02/28/20 0700  Assess: MEWS Score  BP 139/83  Pulse Rate (!) 130  ECG Heart Rate (!) 130  Resp 20  Level of Consciousness Alert  SpO2 99 %  O2 Device Room Air  Assess: MEWS Score  MEWS Temp 0  MEWS Systolic 0  MEWS Pulse 3  MEWS RR 0  MEWS LOC 0  MEWS Score 3  MEWS Score Color Yellow  Assess: if the MEWS score is Yellow or Red  Were vital signs taken at a resting state? Yes  Focused Assessment No change from prior assessment  Early Detection of Sepsis Score *See Row Information* Low  MEWS guidelines implemented *See Row Information* Yes  Take Vital Signs  Increase Vital Sign Frequency  Yellow: Q 2hr X 2 then Q 4hr X 2, if remains yellow, continue Q 4hrs  Escalate  MEWS: Escalate Yellow: discuss with charge nurse/RN and consider discussing with provider and RRT  Notify: Charge Nurse/RN  Name of Charge Nurse/RN Notified Stephanie, RN  Date Charge Nurse/RN Notified 02/28/20  Time Charge Nurse/RN Notified 0715  Document  Patient Outcome Other (Comment) (Stable at this time)  Progress note created (see row info) Yes

## 2020-02-28 NOTE — Progress Notes (Addendum)
Subjective:   Hospital day: 3  Overnight event: Patient has new oxygen requirements overnight of 2 L [place] with above 95%  This morning, patient is a little sleepy but states he is doing better.  Patient denies any chest pain or shortness of breath.  He continues to be thirsty but this is improved.  Patient is also informed of our plan to have him eat today and transition him off the IV insulin.  Patient agreeable to plan.  Objective:  Vital signs in last 24 hours: Vitals:   02/28/20 0200 02/28/20 0300 02/28/20 0400 02/28/20 0500  BP: 121/87 (!) 137/103 120/77 (!) 122/93  Pulse: (!) 132 (!) 131 (!) 130 (!) 131  Resp: 19 14 (!) 26 14  Temp:      TempSrc:      SpO2: 97% 100% 97% 99%  Weight:      Height:        Physical Exam  General:  Somnolent. Ill-appearing elderly man laying in bed.  No acute distress Head: Normocephalic. Atraumatic. HEENT: Dry mucous membrane CV: Still tachycardic to the 130s. Irregularly irregular rhythm. No murmurs, rubs, or gallops. No LE edema Pulmonary: Lungs CTAB. Normal effort. No wheezing or rales. Abdominal: Soft, nontender, nondistended. Normal bowel sounds. Extremities: Palpable pulses. Normal ROM.  Amputated right hallux Skin: Warm and dry. No obvious rash or lesions. Improved skin turgor Neuro:  A&Ox3. Moves all extremities. Normal sensation. No focal deficit. Psych: Normal mood and affect  Assessment/Plan: Jason [name] is a 69 y.o. male with PMH of T2DM, HTN anxiety/depression, previous CVA, CAD s/p CABG  who presented to the [facility] after a fall and was admitted for hypoglycemia secondary to medication nonadherence. Patient currently being treated for mixed picture of DKA/HHS.  Active Problems:   Hyperosmolar hyperglycemic state (HHS) (HCC)   DKA, type 2 (HCC)   Paroxysmal A-fib (HCC)   Elevated troponin   CAD (coronary artery disease), native coronary artery  DKA/HHS Patient was found to have hyperglycemia and AGMA on arrival. Patient's  lab findings and history consistent with a mixed picture of DKA and HHS. Anion gap has resolved. Blood sugar trending down. Patient with dysphagia 3 from speech eval. plan to transition to subcu insulin today. This morning, patient little somnolent but oriented. --Start subcu Lantus 25 mg daily --SSI, moderate --Discontinue IV insulin infusion and D5 infusion 2 hours after starting subcu Lantus --Continue K+ supplementation --Daily BMP --Dysphagia 3 diet --CBG monitoring per protocol --Diabetes coordinator following, appreciate recs --Patient would need education on how to use insulin  A. Fib Patient recently diagnosed with A. fib and started on metoprolol for rate control and Eliquis for 1 cycle regulation. ECHO on 12/12 showed EF of 40 to 45%, global hypokinesis, moderate LVH and moderately decreased RVSF. Patient s/p more than 9 L of fluid. Starting to make some urine. UOP of 575 cc since admission. No signs of obstruction on renal U/S. Found to be in Aflutter on tele. Will increase metoprolol dose and reassess.  --Increase metoprolol 75 mg twice daily --Continue Eliquis 5 mg twice daily --Start maintenance fluids --Tele  Elevated troponin Likely secondary to demand ischemia in the setting of stress from DKA/HHS. ECHO on 12/12 showed EF of 40 to 45%, global hypokinesis, moderate LVH and moderately decreased RVSF. Tele now showing aflutter. Will watch and repeat trops as needed.  --Telemetry --Trend troponin  Fall Patient with a history of falls presented after falling and laying down for an unknown duration. Patient reports hitting  head with CT head negative for any acute intracranial abnormalities. No new changes today. --Continue fall precautions --Low dose dilaudid prn for pain --PT/OT eval and treat  HTN Patient has a history of hypertension on amlodipine, Benzapril, hydralazine and metoprolol. All antihypertensives were held on admission. BP with systolic in the 100s-130s.   --Increase metoprolol to 75 mg BID --Frequent vitals  CAD Hyperlipidemia Patient has a history of CAD status post CABG more than 20 years ago.  Initially started on aspirin and Plavix. Aspirin has been discontinued years ago. Plavix discontinued after recent diagnosis of A. Fib.  Patient with troponin elevation and hypokinesis on echo.  --Continue rosuvastatin 20 mg daily   Anxiety/depression  Patient with worsening depression due to isolation.  Reported suicidal ideation on admission, stating that he has no resources at home, he is alone and has not been able to take care of himself.  Patient is prescribed buspirone 10 mg, Zoloft 25 mg and trazodone 50 mg however patient does not take these medications. --Plan to restart meds once stable  Diet: Carb modified IVF: IV LR VTE: Eliquis CODE: Full code  Prior to Admission Living Arrangement: Home Anticipated Discharge Location: Pending Barriers to Discharge: Medical work-up Dispo: Anticipated discharge in approximately 1-2 day(s).   Signed: Steffanie Rainwater, MD 02/28/2020, 6:18 AM  Pager: [phone] Internal Medicine Teaching Service After 5pm on weekdays and 1pm on weekends: On Call pager: [phone]

## 2020-02-28 NOTE — Evaluation (Signed)
Physical Therapy Evaluation Patient Details Name: Jason [name] MRN: [id number] DOB: Jan 13, 1951 Today's Date: 02/28/2020   History of Present Illness  Pt adm after found down at home. Pt on the floor for extended time. Pt with DKA. Pt with lt rib fx's 8 and 9 and rt rib fx's 11-12. PMH - DM, HTN, CVA, CABG, afib with rvr, anxiety, depression.  Clinical Impression  Pt presents to PT dependent with all mobility due to weakness and pain. Expect pt will make steady progress but feel he will need SNF for further rehab.     Follow Up Recommendations SNF    Equipment Recommendations  Rolling walker with 5" wheels;Wheelchair (measurements PT);Wheelchair cushion (measurements PT)    Recommendations for Other Services       Precautions / Restrictions Precautions Precautions: Fall;Other (comment) Precaution Comments: suicide precautions      Mobility  Bed Mobility Overal bed mobility: Needs Assistance Bed Mobility: Rolling;Supine to Sit Rolling: Max assist;+2 for physical assistance   Supine to sit: Total assist     General bed mobility comments: Assist to bring hips and shoulders over to roll. Placed bed in chair position and total assist to bring pt's trunk forward off of the bed into upright sitting.    Transfers                 General transfer comment: Too weak to attempt  Ambulation/Gait                Stairs            Wheelchair Mobility    Modified Rankin (Stroke Patients Only)       Balance Overall balance assessment: Needs assistance   Sitting balance-Leahy Scale: Zero Sitting balance - Comments: unable to maintain trunk forward from bed in chair position without total assist.                                     Pertinent Vitals/Pain Pain Assessment: Faces Faces Pain Scale: Hurts whole lot Pain Location: ribs Pain Descriptors / Indicators: Guarding;Grimacing;Moaning Pain Intervention(s): Limited activity within patient's  tolerance;Monitored during session;Repositioned    Home Living Family/patient expects to be discharged to:: Skilled nursing facility Living Arrangements: Alone                    Prior Function Level of Independence: Needs assistance   Gait / Transfers Assistance Needed: Modified independent furniture walking in the home     Comments: Pt was having problems caring for himself     Hand Dominance        Extremity/Trunk Assessment   Upper Extremity Assessment Upper Extremity Assessment: Defer to OT evaluation    Lower Extremity Assessment Lower Extremity Assessment: RLE deficits/detail;LLE deficits/detail RLE Deficits / Details: Grossly 2-/5 LLE Deficits / Details: grossly 2/5       Communication   Communication: No difficulties  Cognition Arousal/Alertness: Awake/alert (but sleepy) Behavior During Therapy: Flat affect Overall Cognitive Status: Impaired/Different from baseline Area of Impairment: Orientation;Memory;Following commands;Problem solving                 Orientation Level: Disoriented to;Time   Memory: Decreased short-term memory Following Commands: Follows one step commands with increased time     Problem Solving: Slow processing;Decreased initiation;Requires verbal cues        General Comments General comments (skin integrity, edema, etc.): HR 130 throughout  Exercises     Assessment/Plan    PT Assessment Patient needs continued PT services  PT Problem List Decreased strength;Decreased activity tolerance;Decreased balance;Decreased mobility;Decreased cognition;Pain       PT Treatment Interventions DME instruction;Gait training;Functional mobility training;Therapeutic activities;Therapeutic exercise;Balance training;Patient/family education    PT Goals (Current goals can be found in the Care Plan section)  Acute Rehab PT Goals Patient Stated Goal: agreeable to working toward moving better PT Goal Formulation: With  patient Time For Goal Achievement: 03/13/20 Potential to Achieve Goals: Fair    Frequency Min 2X/week   Barriers to discharge Decreased caregiver support lives alone    Co-evaluation               AM-PAC PT "6 Clicks" Mobility  Outcome Measure Help needed turning from your back to your side while in a flat bed without using bedrails?: Total Help needed moving from lying on your back to sitting on the side of a flat bed without using bedrails?: Total Help needed moving to and from a bed to a chair (including a wheelchair)?: Total Help needed standing up from a chair using your arms (e.g., wheelchair or bedside chair)?: Total Help needed to walk in hospital room?: Total Help needed climbing 3-5 steps with a railing? : Total 6 Click Score: 6    End of Session   Activity Tolerance: Patient limited by fatigue Patient left: in bed;with call bell/phone within reach;with nursing/sitter in room Nurse Communication: Mobility status PT Visit Diagnosis: Other abnormalities of gait and mobility (R26.89);History of falling (Z91.81);Muscle weakness (generalized) (M62.81);Pain Pain - Right/Left:  (bilateral) Pain - part of body:  (ribs)    Time: 0174-9449 PT Time Calculation (min) (ACUTE ONLY): 36 min   Charges:   PT Evaluation $PT Eval Moderate Complexity: 1 Mod PT Treatments $Therapeutic Activity: 8-22 mins        [facility] PT Acute Rehabilitation Services Pager [phone] Office [phone]   Angelina Ok [facility] 02/28/2020, 1:14 PM

## 2020-02-28 NOTE — TOC CAGE-AID Note (Signed)
Transition of Care [facility]) - CAGE-AID Screening   Patient Details  Name: Jason [name] MRN: [id number] Date of Birth: 1950-06-23  [facility] of [facility]) CM/SW Contact:    Terrial Rhodes, LCSWA Phone Number: 02/28/2020, 3:46 PM   Patient denied alcohol or drug use. No resources needed.  CSW will continue to follow.   Clinical Narrative:    CAGE-AID Screening:    Have You Ever Felt You Ought to Cut Down on Your Drinking or Drug Use?: No Have People Annoyed You By Critizing Your Drinking Or Drug Use?: No Have You Felt Bad Or Guilty About Your Drinking Or Drug Use?: No Have You Ever Had a Drink or Used Drugs First Thing In The Morning to Steady Your Nerves or to Get Rid of a Hangover?: No CAGE-AID Score: 0  Substance Abuse Education Offered: Yes  Substance abuse interventions: Patient Counseling

## 2020-02-29 LAB — CBC
HCT: 41.7 % (ref 39.0–52.0)
Hemoglobin: 13.3 g/dL (ref 13.0–17.0)
MCH: 27.9 pg (ref 26.0–34.0)
MCHC: 31.9 g/dL (ref 30.0–36.0)
MCV: 87.4 fL (ref 80.0–100.0)
Platelets: 155 10*3/uL (ref 150–400)
RBC: 4.77 MIL/uL (ref 4.22–5.81)
RDW: 14.2 % (ref 11.5–15.5)
WBC: 5.6 10*3/uL (ref 4.0–10.5)
nRBC: 0 % (ref 0.0–0.2)

## 2020-02-29 LAB — URINE CULTURE

## 2020-02-29 LAB — COMPREHENSIVE METABOLIC PANEL
ALT: 14 U/L (ref 0–44)
AST: 19 U/L (ref 15–41)
Albumin: 2 g/dL — ABNORMAL LOW (ref 3.5–5.0)
Alkaline Phosphatase: 131 U/L — ABNORMAL HIGH (ref 38–126)
Anion gap: 6 (ref 5–15)
BUN: 9 mg/dL (ref 8–23)
CO2: 29 mmol/L (ref 22–32)
Calcium: 8.3 mg/dL — ABNORMAL LOW (ref 8.9–10.3)
Chloride: 98 mmol/L (ref 98–111)
Creatinine, Ser: 0.75 mg/dL (ref 0.61–1.24)
GFR, Estimated: >60 mL/min (ref >60)
Glucose, Bld: 348 mg/dL — ABNORMAL HIGH (ref 70–99)
Potassium: 4.3 mmol/L (ref 3.5–5.1)
Sodium: 133 mmol/L — ABNORMAL LOW (ref 135–145)
Total Bilirubin: 0.7 mg/dL (ref 0.3–1.2)
Total Protein: 4.6 g/dL — ABNORMAL LOW (ref 6.5–8.1)

## 2020-02-29 LAB — GLUCOSE, CAPILLARY
Glucose-Capillary: 314 mg/dL — ABNORMAL HIGH (ref 70–99)
Glucose-Capillary: 343 mg/dL — ABNORMAL HIGH (ref 70–99)
Glucose-Capillary: 314 mg/dL — ABNORMAL HIGH (ref 70–99)
Glucose-Capillary: 218 mg/dL — ABNORMAL HIGH (ref 70–99)

## 2020-02-29 MED ORDER — POTASSIUM PHOSPHATES 15 MMOLE/5ML IV SOLN
15.0000 mmol | Freq: Once | INTRAVENOUS | Status: AC
  Administered 2020-02-29: 15 mmol via INTRAVENOUS
  Filled 2020-02-29: qty 5

## 2020-02-29 MED ORDER — LACTATED RINGERS IV SOLN: INTRAVENOUS | Status: DC

## 2020-02-29 MED ORDER — METOPROLOL TARTRATE 100 MG PO TABS
100.0000 mg | ORAL_TABLET | Freq: Two times a day (BID) | ORAL | Status: DC
  Administered 2020-02-29 – 2020-03-03 (×8): 100 mg via ORAL
  Filled 2020-02-29 (×9): qty 1

## 2020-02-29 MED ORDER — BENAZEPRIL HCL 40 MG PO TABS
40.0000 mg | ORAL_TABLET | Freq: Every day | ORAL | Status: DC
  Administered 2020-02-29 – 2020-03-08 (×9): 40 mg via ORAL
  Filled 2020-02-29 (×10): qty 1

## 2020-02-29 MED ORDER — INSULIN ASPART 100 UNIT/ML ~~LOC~~ SOLN
7.0000 [IU] | Freq: Three times a day (TID) | SUBCUTANEOUS | Status: DC
  Administered 2020-02-29 – 2020-03-08 (×24): 7 [IU] via SUBCUTANEOUS

## 2020-02-29 NOTE — Progress Notes (Signed)
Inpatient Diabetes Program Recommendations  AACE/ADA: New Consensus Statement on Inpatient Glycemic Control (2015)  Target Ranges:  Prepandial:   less than 140 mg/dL      Peak postprandial:   less than 180 mg/dL (1-2 hours)      Critically ill patients:  140 - 180 mg/dL   Lab Results  Component Value Date   GLUCAP 314 (H) 02/29/2020   HGBA1C 12.8 (H) 02/26/2020    Review of Glycemic Control Results for Jason [name], Jason [name] (MRN [id number]) as of 02/29/2020 11:27  Ref. Range 02/28/2020 09:47 02/28/2020 12:00 02/28/2020 16:01 02/28/2020 22:08 02/29/2020 07:53  Glucose-Capillary Latest Ref Range: 70 - 99 mg/dL 465 (H) 035 (H) 465 (H) 258 (H) 314 (H)   Diabetes history:  DM2 Outpatient Diabetes medications:  Basaglar 30 units daily (Not taking) Humalog 2-12 units TID (Not taking) Current orders for Inpatient glycemic control:  Lantus 25 units daily Novolog 7 units TID with meals  Novolog 0-15 units TID with meals  Inpatient Diabetes Program Recommendations:     Novlog 30 units daily Novolog 0-5 units QHS  Note:  Referral for diabetes education.  Sitter is at bedside.  Spoke with patient at bedside.  He states he was supposed to start insulin when he left [place]. He is unsure when that was.  He states no one told him how to administer insulin so he never started.  He is very sleeping and not a good historian during this consult.  He will be discharging to SNF and will need to be taught insulin administration there as he states he is not ready to be taught now.    Will continue to follow while inpatient.  Thank you, Dulce Sellar, RN, BSN Diabetes Coordinator Inpatient Diabetes Program [phone] (team pager from 8a-5p)

## 2020-02-29 NOTE — TOC Progression Note (Signed)
Transition of Care [facility]) - Progression Note    Patient Details  Name: Jason [name] MRN: [id number] Date of Birth: 1950/12/19  Transition of Care [facility]) CM/SW Contact  Terrial Rhodes, LCSWA Phone Number: 02/29/2020, 11:19 AM  Clinical Narrative:     CSW spoke with patient at bedside and provided SNF bed offers. Patient chose SNF placement at [facility]. CSW called patients son to update him on patients SNF choice. CSW spoke with [place] with [facility] who confirmed that they can accept patient for SNF placement. Teena started insurance authorization for patient.  Patient has SNF bed with [facility]. Insurance authorization is pending.  CSW will continue to follow.   Expected Discharge Plan: Skilled Nursing Facility Barriers to Discharge: Continued Medical Work up  Expected Discharge Plan and Services Expected Discharge Plan: Skilled Nursing Facility       Living arrangements for the past 2 months: Single Family Home                                       Social Determinants of Health (SDOH) Interventions    Readmission Risk Interventions No flowsheet data found.

## 2020-02-29 NOTE — Progress Notes (Signed)
  Speech Language Pathology Treatment: Dysphagia  Patient Details Name: Jason [name] MRN: [id number] DOB: 1950/09/11 Today's Date: 02/29/2020 Time: 2761-8485 SLP Time Calculation (min) (ACUTE ONLY): 11 min  Assessment / Plan / Recommendation Clinical Impression  Pt consumed soft solids and thin liquids with no overt s/s of aspiration, needing additional time for mastication and oral clearance of dry food but managing this with Mod I. Pt had reported to evaluating SLP that he eats softer foods at home, and he shares similar information with this SLP today. He denies anything being too tough to chew on his trays so far, but also cannot remember what he had for breakfast. Based on reported hx and current presentation, mechanical soft diet seems most appropriate. SLP to sign off acutely - please reorder if needed.    HPI HPI: Pt is a 69 y.o. male with a medical history of T2DM, HTN anxiety/depression, previous CVA, CAD s/p CABG who presented to the ED after recent fall. Pt had associated dry mouth, polyuria, polydipsia, with some nausea and vomiting. CXR 12/11: No acute cardiopulmonary process. CT chest: Linear, parenchymal bands are noted within both lower lobes, likely the sequelae of chronic inflammation or infection. No acute airspace consolidation, atelectasis or pneumothorax identified. Pt was made NPO on admission with plan (Per Dr. Sammie Bench note) to transition to carb modified on 12/12.      SLP Plan  All goals met       Recommendations  Diet recommendations: Dysphagia 3 (mechanical soft);Thin liquid Liquids provided via: Cup;Straw Medication Administration: Whole meds with puree Supervision: Patient able to self feed;Intermittent supervision to cue for compensatory strategies Compensations: Slow rate;Small sips/bites Postural Changes and/or Swallow Maneuvers: Seated upright 90 degrees                Oral Care Recommendations: Oral care BID Follow up Recommendations: None SLP Visit  Diagnosis: Dysphagia, unspecified (R13.10) Plan: All goals met       GO                Osie Bond., M.A. [place] Acute Rehabilitation Services Pager [phone] Office [phone]  02/29/2020, 12:36 PM

## 2020-02-29 NOTE — Plan of Care (Signed)
Problem: Education: Goal: Knowledge of General Education information will improve Description: Including pain rating scale, medication(s)/side effects and non-pharmacologic comfort measures Outcome: Progressing   Problem: Health Behavior/Discharge Planning: Goal: Ability to manage health-related needs will improve Outcome: Progressing   Problem: Clinical Measurements: Goal: Ability to maintain clinical measurements within normal limits will improve Outcome: Progressing Goal: Will remain free from infection Outcome: Progressing Goal: Diagnostic test results will improve Outcome: Progressing Goal: Respiratory complications will improve Outcome: Progressing Goal: Cardiovascular complication will be avoided Outcome: Progressing   Problem: Activity: Goal: Risk for activity intolerance will decrease Outcome: Progressing   Problem: Nutrition: Goal: Adequate nutrition will be maintained Outcome: Progressing   Problem: Coping: Goal: Level of anxiety will decrease Outcome: Progressing   Problem: Elimination: Goal: Will not experience complications related to bowel motility Outcome: Progressing Goal: Will not experience complications related to urinary retention Outcome: Progressing   Problem: Pain Managment: Goal: General experience of comfort will improve Outcome: Progressing   Problem: Safety: Goal: Ability to remain free from injury will improve Outcome: Progressing   Problem: Skin Integrity: Goal: Risk for impaired skin integrity will decrease Outcome: Progressing   Problem: Education: Goal: Ability to make informed decisions regarding treatment will improve Outcome: Progressing   Problem: Coping: Goal: Coping ability will improve Outcome: Progressing   Problem: Health Behavior/Discharge Planning: Goal: Identification of resources available to assist in meeting health care needs will improve Outcome: Progressing   Problem: Medication: Goal: Compliance with  prescribed medication regimen will improve Outcome: Progressing   Problem: Self-Concept: Goal: Ability to disclose and discuss suicidal ideas will improve Outcome: Progressing Goal: Will verbalize positive feelings about self Outcome: Progressing   Problem: Education: Goal: Knowledge of disease or condition will improve Outcome: Progressing Goal: Understanding of medication regimen will improve Outcome: Progressing Goal: Individualized Educational Video(s) Outcome: Progressing   Problem: Activity: Goal: Ability to tolerate increased activity will improve Outcome: Progressing   Problem: Cardiac: Goal: Ability to achieve and maintain adequate cardiopulmonary perfusion will improve Outcome: Progressing   Problem: Health Behavior/Discharge Planning: Goal: Ability to safely manage health-related needs after discharge will improve Outcome: Progressing   Problem: Nutrition Goal: Patient maintains adequate hydration Outcome: Progressing Goal: Patient maintains weight Outcome: Progressing Goal: Patient/Family demonstrates understanding of diet Outcome: Progressing Goal: Patient/Family independently completes tube feeding Outcome: Progressing Goal: Patient will have no more than 5 lb weight change during LOS Outcome: Progressing Goal: Patient will utilize adaptive techniques to administer nutrition Outcome: Progressing Goal: Patient will verbalize dietary restrictions Outcome: Progressing   Problem: Education: Goal: Ability to describe self-care measures that may prevent or decrease complications (Diabetes Survival Skills Education) will improve Outcome: Progressing Goal: Individualized Educational Video(s) Outcome: Progressing   Problem: Cardiac: Goal: Ability to maintain an adequate cardiac output will improve Outcome: Progressing   Problem: Health Behavior/Discharge Planning: Goal: Ability to identify and utilize available resources and services will improve Outcome:  Progressing Goal: Ability to manage health-related needs will improve Outcome: Progressing   Problem: Fluid Volume: Goal: Ability to achieve a balanced intake and output will improve Outcome: Progressing   Problem: Metabolic: Goal: Ability to maintain appropriate glucose levels will improve Outcome: Progressing   Problem: Nutritional: Goal: Maintenance of adequate nutrition will improve Outcome: Progressing Goal: Maintenance of adequate weight for body size and type will improve Outcome: Progressing   Problem: Respiratory: Goal: Will regain and/or maintain adequate ventilation Outcome: Progressing   Problem: Urinary Elimination: Goal: Ability to achieve and maintain adequate renal perfusion and functioning will improve Outcome: Progressing

## 2020-02-29 NOTE — Discharge Instructions (Signed)

## 2020-02-29 NOTE — Progress Notes (Addendum)
Subjective:   Hospital day: 4  PT recommended SNF placement yesterday.  Patient and son agreeable to plan.  Referral sent out by social work.  Overnight event: No acute events overnight  This morning, feels similar to yesterday. Notes some neck pain from angle of sleeping. Feels that his ear is full of wax. Dicussed plan to manage heart rate and blood sugar and plans to look for SNF placement. He denies chest pain. Notes some pain in ribs, advised it will take several weeks to heal and encouraged he continue to take deep breaths as they slowly heal.  Objective:  Vital signs in last 24 hours: Vitals:   02/28/20 2000 02/28/20 2100 02/29/20 0016 02/29/20 0500  BP: 108/74 118/73 119/85 114/68  Pulse: 90 82 76 89  Resp: (!) 26  17 (!) 21  Temp: 98.3 F (36.8 C)  98.3 F (36.8 C) 98.3 F (36.8 C)  TempSrc: Oral  Oral Oral  SpO2: 97%  92% 98%  Weight:    102.4 kg  Height:        Physical Exam  General:  Somnolent.  Ill-appearing elderly man laying in bed.  No acute distress. Head: Normocephalic. Atraumatic. HEENT: Moist mucous membrane CV: Rate improved. Irregular rhythm.  No murmurs, rubs, or gallops.  No lower extremity edema. Pulmonary:  Lungs CTAB.  Normal effort.  No wheezing or rales. Abdominal: Soft, nontender, nondistended. Normal bowel sounds. Extremities: Palpable pulses. Normal ROM.  Amputated right hallux Skin: Warm and dry. No obvious rash or lesions.  Neuro:  A&Ox3. Moves all extremities. Normal sensation. No focal deficit. Psych: Normal mood and affect  Assessment/Plan: Jason [name] is a 69 y.o. male with PMH of T2DM, HTN anxiety/depression, previous CVA, CAD s/p CABG  who presented to the [facility] after a fall and was admitted for hyperglycemia secondary to medication nonadherence. Patient currently being treated for mixed picture of DKA/HHS. Clinically improving and HR now better controlled.   Principal Problem:   Hyperosmolar hyperglycemic state (HHS)  (HCC) Active Problems:   DKA, type 2 (HCC)   Paroxysmal A-fib (HCC)   Elevated troponin   CAD (coronary artery disease), native coronary artery  DKA/HHS Patient was found to have hyperglycemia and AGMA on arrival. Patient's lab findings and history consistent with a mixed picture of DKA and HHS. Anion gap has resolved. Patient with dysphagia 3 from speech eval. CBG overnight elevated to 258. Will add meal coverage and monitor.   --Continue on subcu Lantus 25 mg daily  --Start Novolog 7u TID w/ meals --SSI, moderate --IV LR @125  mg/hr --Continue potassium supplementation --Daily BMP --Continue dysphagia 3 diet --CBG monitoring  A. Fib Patient recently diagnosed with A. fib and started on metoprolol for rate control and Eliquis for 1 cycle regulation. ECHO on 12/12 showed EF of 40 to 45%, global hypokinesis, moderate LVH and moderately decreased RVSF. Patient s/p more than 9 L of fluid. No signs of obstruction on renal U/S. Making appropriate urine output. In aflutter with HR in the 90s. --Increase metoprolol to 100 mg twice daily --Continue Eliquis 5 mg twice daily --Telemetry  Elevated troponin Likely secondary to demand ischemia in the setting of stress from DKA/HHS. ECHO on 12/12 showed EF of 40 to 45%, global hypokinesis, moderate LVH and moderately decreased RVSF.  --Telemetry  Fall Patient with a history of falls presented after falling and laying down for an unknown duration. Patient reports hitting head with CT head negative for any acute intracranial abnormalities. No new changes today. --  PT recommending SNF --Continue fall precautions --Low-dose Dilaudid prn for pain  HTN Patient has a history of hypertension on amlodipine, Benzapril, hydralazine and metoprolol. All antihypertensives were held on admission. BP stable at 131/94 this morning. --Increase metoprolol 100 mg twice daily --Frequent vitals  CAD Hyperlipidemia Patient has a history of CAD status post CABG  more than 20 years ago. Initially started on aspirin and Plavix. Aspirin has been discontinued years ago. Plavix discontinued after recent diagnosis of A. Fib. Patient with troponin elevation and hypokinesis on echo. Denies any chest pain.  We will continue to monitor. --Continue rosuvastatin 20 mg daily  Anxiety/depression  Patient with worsening depression due to isolation. Reported suicidal ideation on admission, stating that he has no resources at home, he is alone and has not been able to take care of himself. Patient is prescribed buspirone 10 mg, Zoloft 25 mg and trazodone 50 mg however patient does not take these medications. --Plan to restart meds once stable  Diet: Carb modified IVF: IV LR VTE: Eliquis CODE: Full code  Prior to Admission Living Arrangement: Home Anticipated Discharge Location: SNF placement Barriers to Discharge: Medical work-up Dispo: Anticipated discharge in approximately 1-2 day(s).   Signed: Steffanie Rainwater, MD 02/29/2020, 6:55 AM  Pager: [phone] Internal Medicine Teaching Service After 5pm on weekdays and 1pm on weekends: On Call pager: [phone]

## 2020-02-29 NOTE — Evaluation (Signed)
Occupational Therapy Evaluation Patient Details Name: Jason [name] MRN: [id number] DOB: 1950/12/03 Today's Date: 02/29/2020    History of Present Illness Pt adm after found down at home. Pt on the floor for extended time. Pt with DKA. Pt with lt rib fx's 8 and 9 and rt rib fx's 11-12. PMH - DM, HTN, CVA, CABG, afib with rvr, anxiety, depression.   Clinical Impression   Pt presents with decline in function and safety with ADLs and ADL mobility with impaired strength, balance, endurance and cognition. Pt required max encouragement to participate in sitting EOB. Pt currently required +2 max A for be mobility to sit EOB and max - total A with ADLs/selfcare. Pt too weak to attempt standing with OT and NT. Pt would benefit from acute OT services to address impairments to maximize level of function and safety    Follow Up Recommendations  Supervision/Assistance - 24 hour    Equipment Recommendations   (TBD at next venue of care)    Recommendations for Other Services       Precautions / Restrictions Precautions Precautions: Fall;Other (comment) Precaution Comments: suicide precautions Restrictions Weight Bearing Restrictions: No      Mobility Bed Mobility Overal bed mobility: Needs Assistance Bed Mobility: Supine to Sit Rolling: Max assist;+2 for physical assistance         General bed mobility comments: assist with LEs to EOB, to elevate trunk and used pads to scoot pt's hips forward to sitting EOB    Transfers                 General transfer comment: Too weak to attempt    Balance Overall balance assessment: Needs assistance   Sitting balance-Leahy Scale: Poor Sitting balance - Comments: required max - mod A initially for several minutes, then to min/min guard A sitting EOB Postural control: Posterior lean;Right lateral lean                                 ADL either performed or assessed with clinical judgement   ADL Overall ADL's : Needs  assistance/impaired Eating/Feeding: Set up;Supervision/ safety;Sitting;Bed level   Grooming: Wash/dry hands;Wash/dry face;Sitting;Min guard Grooming Details (indicate cue type and reason): min A for balance/support Upper Body Bathing: Maximal assistance   Lower Body Bathing: Total assistance   Upper Body Dressing : Maximal assistance   Lower Body Dressing: Total assistance     Toilet Transfer Details (indicate cue type and reason): unable Toileting- Clothing Manipulation and Hygiene: Total assistance         General ADL Comments: Pt required max encouragement to participate in sitting EOB for functional tasks     Vision Baseline Vision/History: Wears glasses Patient Visual Report: No change from baseline       Perception     Praxis      Pertinent Vitals/Pain Pain Assessment: Faces Faces Pain Scale: Hurts little more Pain Location: ribs Pain Descriptors / Indicators: Guarding;Grimacing;Moaning Pain Intervention(s): Limited activity within patient's tolerance;Monitored during session;Repositioned     Hand Dominance Right   Extremity/Trunk Assessment Upper Extremity Assessment Upper Extremity Assessment: Generalized weakness           Communication Communication Communication: No difficulties   Cognition Arousal/Alertness: Awake/alert Behavior During Therapy: Flat affect Overall Cognitive Status: Impaired/Different from baseline Area of Impairment: Orientation;Memory;Following commands;Problem solving                     Memory:  Decreased short-term memory Following Commands: Follows one step commands with increased time     Problem Solving: Slow processing;Decreased initiation;Requires verbal cues     General Comments       Exercises     Shoulder Instructions      Home Living Family/patient expects to be discharged to:: Skilled nursing facility Living Arrangements: Alone                                      Prior  Functioning/Environment Level of Independence: Needs assistance  Gait / Transfers Assistance Needed: Modified independent furniture walking in the home ADL's / Homemaking Assistance Needed: Pt states that he was able to bathe himself at the sink and dress himself            OT Problem List: Decreased strength;Impaired balance (sitting and/or standing);Decreased cognition;Pain;Decreased range of motion;Decreased activity tolerance;Decreased knowledge of use of DME or AE;Decreased coordination;Impaired UE functional use      OT Treatment/Interventions: Self-care/ADL training;Therapeutic exercise;Balance training;Patient/family education;DME and/or AE instruction    OT Goals(Current goals can be found in the care plan section) Acute Rehab OT Goals Patient Stated Goal: "go live somewhere else" OT Goal Formulation: With patient Time For Goal Achievement: 03/14/20 Potential to Achieve Goals: Good ADL Goals Pt Will Perform Grooming: with supervision;with set-up;sitting Pt Will Perform Upper Body Bathing: with mod assist;sitting Pt Will Perform Upper Body Dressing: with mod assist;sitting Pt Will Transfer to Toilet: with mod assist;with +2 assist;stand pivot transfer;bedside commode Additional ADL Goal #1: Pt will complete bed mobility mod A to sit EOB for ADL/selfcare tasks with fair sitting balance  OT Frequency: Min 2X/week   Barriers to D/C:            Co-evaluation              AM-PAC OT "6 Clicks" Daily Activity     Outcome Measure Help from another person eating meals?: A Little Help from another person taking care of personal grooming?: A Little Help from another person toileting, which includes using toliet, bedpan, or urinal?: Total Help from another person bathing (including washing, rinsing, drying)?: Total Help from another person to put on and taking off regular upper body clothing?: A Lot Help from another person to put on and taking off regular lower body  clothing?: Total 6 Click Score: 11   End of Session    Activity Tolerance: Patient limited by fatigue Patient left: in bed;with call bell/phone within reach;Other (comment);with nursing/sitter in room (sitting EOB with 2 NTs present to complete ADLs)  OT Visit Diagnosis: Other abnormalities of gait and mobility (R26.89);Muscle weakness (generalized) (M62.81);History of falling (Z91.81);Other symptoms and signs involving cognitive function;Pain Pain - Right/Left: Right Pain - part of body: Arm;Leg (ribs area)                Time: 1324-4010 OT Time Calculation (min): 23 min Charges:  OT General Charges $OT Visit: 1 Visit OT Evaluation $OT Eval Moderate Complexity: 1 Mod OT Treatments $Self Care/Home Management : 8-22 mins    Galen Manila 02/29/2020, 12:50 PM

## 2020-03-01 LAB — GLUCOSE, CAPILLARY
Glucose-Capillary: 247 mg/dL — ABNORMAL HIGH (ref 70–99)
Glucose-Capillary: 171 mg/dL — ABNORMAL HIGH (ref 70–99)
Glucose-Capillary: 240 mg/dL — ABNORMAL HIGH (ref 70–99)
Glucose-Capillary: 226 mg/dL — ABNORMAL HIGH (ref 70–99)

## 2020-03-01 LAB — PHOSPHORUS: Phosphorus: 3.3 mg/dL (ref 2.5–4.6)

## 2020-03-01 MED ORDER — INSULIN GLARGINE 100 UNIT/ML ~~LOC~~ SOLN
30.0000 [IU] | Freq: Every day | SUBCUTANEOUS | Status: DC
  Administered 2020-03-01: 30 [IU] via SUBCUTANEOUS
  Filled 2020-03-01 (×2): qty 0.3

## 2020-03-01 NOTE — Progress Notes (Signed)
Subjective:   Hospital day: 5  Overnight event: NAEOV  This morning, AM patient is more awake. States he slept fine and is doing better. Continues to have some left rib pain but denies any SOB or chest pain. Patient was informed of plans to get him to [place] for rehab.   Objective:  Vital signs in last 24 hours: Vitals:   02/29/20 2355 03/01/20 0255 03/01/20 0350 03/01/20 0753  BP: (!) 144/75 128/72 110/71 (!) 138/123  Pulse: 67 65 66 87  Resp: (!) 23 18 (!) 23 20  Temp: 98 F (36.7 C) 98.2 F (36.8 C) 98.2 F (36.8 C) 98.2 F (36.8 C)  TempSrc: Oral Oral Oral Oral  SpO2: 100% 98% 90% 95%  Weight:  99.1 kg    Height:        Physical Exam  General: Pleasant, ill-appearing elderly man laying in bed. More Awake today. No acute distress. Head: Normocephalic. Atraumatic. HEENT: Moist mucus membrane. EOMI CV: Rate improved. Irregular rhythm. No murmurs, rubs, or gallops. No lower extremity edema. Pulmonary:  Lungs CTAB.  Normal effort.  No wheezing or rales. Abdominal: Soft, nontender, nondistended. Normal bowel sounds. Extremities: Palpable pulses. Normal ROM.  Amputated right hallux Skin: Warm and dry. No obvious rash or lesions.  Neuro: Alert & Oriented to place, person and time. Moves all extremities. 4/5 strength in all extremities. Normal sensation. Normal finger-to-nose testing. Some difficulty w/ left arm due to left rib pain.  Psych: Normal mood and affect  Assessment/Plan: Jason [name] is a 69 y.o. male with PMH of T2DM, HTN anxiety/depression, previous CVA, CAD s/p CABG  who presented to the [facility] after a fall and was admitted for hyperglycemia secondary to medication nonadherence. Patient currently being treated for mixed picture of DKA/HHS. Clinically improving and HR now better controlled.   Principal Problem:   Hyperosmolar hyperglycemic state (HHS) (HCC) Active Problems:   DKA, type 2 (HCC)   Paroxysmal A-fib (HCC)   Elevated troponin   CAD  (coronary artery disease), native coronary artery  DKA/HHS Patient was found to have hyperglycemia and AGMA on arrival. Patient's lab findings and history consistent with a mixed picture of DKA and HHS. Anion gap has resolved. Patient with dysphagia 3 from speech eval. Blood sugar better controlled but still elevated to the 200s. Will increase long-acting insulin and monitor. IVF discontinued due to patient being euvolemic.  --Increase Lantus to 30u daily --Continue Novolog 7u TID w/ meals --SSI, Moderate --CBG monitoring --Discontinued IVF.  --Daily BMP --Continue dysphagia 3 diet  A. Fib Patient recently diagnosed with A. fib and started on metoprolol for rate control and Eliquis for anticoagulation. ECHO on 12/12 showed EF of 40 to 45%, global hypokinesis, moderate LVH and moderately decreased RVSF. Making appropriate urine output. Patient now in Aflutter with HR well-controlled in the 90s.  --Continue metoprolol 100 mg BID --Continue Eliquis 5 mg BID --Tele  Elevated troponin Likely secondary to demand ischemia in the setting of stress from DKA/HHS. ECHO on 12/12 showed EF of 40 to 45%, global hypokinesis, moderate LVH and moderately decreased RVSF. Denies any CP or SOB. --Tele  Fall Patient with a history of falls presented after falling and laying down for an unknown duration. Patient reports hitting head with CT head negative for any acute intracranial abnormalities.  --Pending SN[facility] placement to [facility] --Continue fall precautions --Continue PT/OT  HTN Patient has a history of hypertension on amlodipine, Benzapril, hydralazine and metoprolol. All antihypertensives were held on admission. BP  starting to trend up. Currently at 138/123.  --Continue metoprolol 100 mg daily --Restarted Benzapril 40 ng daily.  --Frequent vitals  CAD Hyperlipidemia Patient has a history of CAD status post CABG more than 20 years ago. Initially started on aspirin and Plavix. Aspirin has been  discontinued years ago. Plavix discontinued after recent diagnosis of A. Fib. No acute changes today. Denies any CP or SOB.  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 Reported suicidal ideation on admission, stating that he has no resources at home, he is alone and has not been able to take care of himself. Patient is prescribed buspirone 10 mg, Zoloft 25 mg and trazodone 50 mg however patient does not take these medications. --Plan to restart meds once stable  Diet: Dysphagia 3 IVF: IV LR VTE: Eliquis CODE: Full code  Prior to Admission Living Arrangement: Home Anticipated Discharge Location: SNF placement Barriers to Discharge: Medical work-up and insurance auth Dispo: Anticipated discharge in approximately 1-2 day(s).   Signed: Steffanie Rainwater, MD 03/01/2020, 8:13 AM  Pager: [phone] Internal Medicine Teaching Service After 5pm on weekdays and 1pm on weekends: On Call pager: [phone]

## 2020-03-01 NOTE — Discharge Summary (Addendum)
Name: Jason [name] MRN: [id number] DOB: Oct 15, 1950 69 y.o. PCP: Default, Provider, MD  Date of Admission: 02/26/2020  1:14 PM Date of Discharge: 03/08/2020 Attending Physician: Dr. Mayford Knife  Discharge Diagnosis: Principal Problem:   Hyperosmolar hyperglycemic state (HHS) (HCC) Active Problems:   DKA, type 2 (HCC)   Paroxysmal A-fib (HCC)   Elevated troponin   CAD (coronary artery disease), native coronary artery    Discharge Medications: Allergies as of 03/08/2020   No Known Allergies     Medication List    STOP taking these medications   amLODipine 10 MG tablet Commonly known as: NORVASC   busPIRone 10 MG tablet Commonly known as: BUSPAR   carvedilol 25 MG tablet Commonly known as: COREG   hydrOXYzine 25 MG tablet Commonly known as: ATARAX/VISTARIL   lisinopril 40 MG tablet Commonly known as: ZESTRIL   sertraline 25 MG tablet Commonly known as: ZOLOFT   traZODone 50 MG tablet Commonly known as: DESYREL     TAKE these medications   aspirin EC 81 MG tablet Take 1 tablet (81 mg total) by mouth daily.   Basaglar KwikPen 100 UNIT/ML Inject 35 Units into the skin daily. ON Hold per [facility] What changed: how much to take   benazepril 40 MG tablet Commonly known as: LOTENSIN Take 1 tablet (40 mg total) by mouth daily.   buPROPion 75 MG tablet Commonly known as: WELLBUTRIN Take 1 tablet (75 mg total) by mouth daily. Start taking on: March 09, 2020   Eliquis 5 MG Tabs tablet Generic drug: apixaban Take 1 tablet (5 mg total) by mouth 2 (two) times daily.   insulin lispro 100 UNIT/ML injection Commonly known as: HUMALOG Inject 0.02-0.12 mLs (2-12 Units total) into the skin 3 (three) times daily with meals. Based on Sliding scale   lidocaine 5 % Commonly known as: LIDODERM Place 2 patches onto the skin daily. Remove & Discard patch within 12 hours or as directed by MD Start taking on: March 09, 2020   melatonin 3 MG Tabs tablet Take 1 tablet (3 mg  total) by mouth at bedtime as needed. What changed: reasons to take this   metoprolol tartrate 50 MG tablet Commonly known as: LOPRESSOR Take 1 tablet (50 mg total) by mouth 2 (two) times daily. What changed:   how much to take  when to take this   oxyCODONE 5 MG immediate release tablet Commonly known as: Oxy IR/ROXICODONE Take 1 tablet (5 mg total) by mouth every 6 (six) hours as needed for up to 5 days for breakthrough pain.   rosuvastatin 20 MG tablet Commonly known as: CRESTOR Take 1 tablet (20 mg total) by mouth daily. Start taking on: March 09, 2020 What changed: when to take this       Disposition and follow-up:   Jason [name] was discharged from [facility] in Stable condition.  At the hospital follow up visit please address:  1.  Follow-up:  A. HHS/DKA: Patient's blood sugar was stable on discharge with CBG range of 83-178 over the 48 hrs before discharge. Continue current insulin regimen and modify as needed.     B. Fall/Rib fractures: Ribs pain still limiting patient's mobility. Please continue daily PT and pain management. Patient at risk of falls and pressure ulcers due to long immobility. Please ensure he is getting frequent checks of his back and heels.   C. Afib: HR was persistently in the 130s and was eventually controlled with titrating metoprolol up to 100 mg. Now  on metoprolol 50 mg with HR in 60s-70s. Continue to monitor and adjust as needed to keep HR below 110.   D. Depression: Patient was on buspirone 10 mg, Zoloft 25 mg and trazodone 50 mg at home but was not taking them. He was started on Buproprion 75 mg daily due to its short acting and activating effects. Up-titrate at your discretion.   E. HTN: He was started on home metoprolol and benazepril which kept his BP relatively stable. Can consider adding another agent as needed for good BP control.   2.  Labs / imaging needed at time of follow-up: BMP, CBC  3.  Pending labs/ test  needing follow-up: None  Follow-up Appointments:  Contact information for after-discharge care    Destination    [facility] .   Service: Skilled Nursing Contact information: [address] [place] [address] [phone]                  Hospital Course by problem list: Jason [name] is a 69 y.o. male with PMH of T2DM, HTN anxiety/depression, previous CVA, CAD s/p CABG  who presented to the [facility] after a fall and was admitted for hyperglycemia secondary to medication nonadherence. Patient was treated for mixed picture of DKA/HHS. HR was controlled with metoprolol and his rib pain was managed with lidocaine patch and pain meds.  DKA/HHS Patient presented to [facility] after a recent fall and being found down by son. Patient was on the ground for several days apparently. In the ED, he was found to be significantly hyperglycemic with an elevated osmolality and a anion gap metabolic acidosis positive for ketones. Although patient's laboratory analysis showed a mixed picture of DKA and HHS, he had some neurologic symptoms such as altered mental status concerning for HHS. Regardless, patient was started on IV insulin therapy and fluid repleted with roughly 8 L of LR within 24 to 36 hours. Patient's potassium was supplemented during this time.  Eventually he was successfully transitioned to subcutaneous insulin when his anion gap acidosis had resolved and he had mild improvement of his mentation. Blood sugar well controlled on Lantus 25 mg daily, NovoLog 7 units 3 times daily with meals and sliding scale insulin.   Fall Rib fractures Patient w/ history of frequent calls presented after a fall on his left side. CXR showed acute, displaced L 10th rib fracture with multiple chronic rib fractures. 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 Patient continued to have left ribs pain  during admission that limited his mobility. This was managed with 2 lidocaine, prn IV dilaudid, tylenol and oxy. Patient will required frequent PT and pain control.   A. Fib Patient recently diagnosed with A. fib and started on metoprolol for rate control and Eliquis for 1 cycle regulation. Patient presents to the hospital with EKG showing signs of atrial flutter with rates in the 130s.  Patient was restarted on his metoprolol at reduced dose of 50 mg twice daily.  This was titrated up to metoprolol 100 mg twice daily with improvement of his rate.  Patient was continued on anticoagulation with Eliquis in the hospital. Patient had echo findings consistent of reduced ejection fraction of 40 to 45%, global hypokinesis, moderate LVH and moderately decreased RVSF. This will need to be repeated in the future as the patient's rate was not well controlled during this echo. His metoprolol was reduced back to  50 mg due to hypotension and his HR was stable, ranging from 60-70s.    Anxiety/depression  Patient presents with worsening depression/anxiety with suicidal ideation. Patient stated that his SI is due to progression of his medical illness, isolation from his son and grandchildren, and progressive loss of independence.  He is on buspirone 10 mg daily, Zoloft 25 mg daily, and trazodone 50 mg daily at home.  These medications were held due to acute illness and altered mental status. He was started on Bupropion 4 days before discharge with some improvement in mood. He will likely need outpatient follow-up with psychiatry for additional management with medications and counseling.  Elevated troponin Patient had an elevated troponin in the setting of elevated heart rate with atrial flutter. Troponinemia thought to be secondary to demand ischemia in the setting of atrial flutter and stress from DKA/HHS.   HTN Patient has a history of hypertension on amlodipine, Benzapril, hydralazine and metoprolol. All  antihypertensives were held on admission.  Patient's blood pressure mentions will be slowly restarted as his acute illness resolves.  Hyperlipidemia CAD Patient has a history of CAD status post CABG more than 20 years ago. Initially started on aspirin and Plavix. Aspirin has been discontinued years ago. Plavix discontinued after recent diagnosis of A. Fib.  Subsequently started on Eliquis.       Discharge Vitals:   BP 100/67 (BP Location: Left Arm)   Pulse 89   Temp (!) 97.2 F (36.2 C) (Oral)   Resp 19   Ht  (1.778 m)   Wt 99.1 kg   SpO2 96%   BMI 31.35 kg/m   Pertinent Labs, Studies, and Procedures:  CBC Latest Ref Rng & Units 02/29/2020 02/28/2020 02/27/2020  WBC 4.0 - 10.5 K/uL 5.6 5.9 13.3(H)  Hemoglobin 13.0 - 17.0 g/dL 50.2 12.0(L) 15.1  Hematocrit 39.0 - 52.0 % 41.7 37.5(L) 43.3  Platelets 150 - 400 K/uL 155 141(L) 186    CMP Latest Ref Rng & Units 02/29/2020 02/28/2020 02/27/2020  Glucose 70 - 99 mg/dL 774(J) 287(O) 676(H)  BUN 8 - 23 mg/dL Creatinine 0.61 - 1.24 mg/dL 2.09 4.70(J) 6.28  Sodium 135 - 145 mmol/L 133(L) 135 138  Potassium 3.5 - 5.1 mmol/L 4.3 3.6 4.5  Chloride 98 - 111 mmol/L 98 103 108  CO2 22 - 32 mmol/L 29 25 19(L)  Calcium 8.9 - 10.3 mg/dL 8.3(L) 8.0(L) 8.5(L)  Total Protein 6.5 - 8.1 g/dL 4.6(L) 4.3(L) -  Total Bilirubin 0.3 - 1.2 mg/dL 0.7 0.8 -  Alkaline Phos 38 - 126 U/L 131(H) 123 -  AST 15 - 41 U/L 19 18 -  ALT 0 - 44 U/L 14 11 -    CT HEAD WO CONTRAST  Result Date: 02/26/2020 CLINICAL DATA:  69 year old male with history of trauma from a fall. EXAM: CT HEAD WITHOUT CONTRAST CT CERVICAL SPINE WITHOUT CONTRAST TECHNIQUE: Multidetector CT imaging of the head and cervical spine was performed following the standard protocol without intravenous contrast. Multiplanar CT image reconstructions of the cervical spine were also generated. COMPARISON:  Head and cervical spine CT 10/18/2019. FINDINGS: CT HEAD FINDINGS Brain: Moderate  cerebral atrophy with ex vacuo dilatation of the ventricular system. Patchy and confluent areas of decreased attenuation are noted throughout the deep and periventricular white matter of the cerebral hemispheres bilaterally, compatible with chronic microvascular ischemic disease.No evidence of acute infarction, hemorrhage, hydrocephalus, extra-axial collection or mass lesion/mass effect. Vascular: No hyperdense vessel or unexpected calcification. Skull: Normal.  Negative for fracture or focal lesion. Sinuses/Orbits: No acute finding. Other: None. CT CERVICAL SPINE FINDINGS Alignment: Normal. Skull base and vertebrae: No acute fracture. No primary bone lesion or focal pathologic process. Soft tissues and spinal canal: No prevertebral fluid or swelling. No visible canal hematoma. Disc levels: Multilevel degenerative disc disease, most evident throughout the mid to lower cervical spine. Mild multilevel facet arthropathy. Upper chest: Unremarkable. Other: Old healed fracture of the medial aspect of the right clavicle with mild posttraumatic deformity. IMPRESSION: 1. No evidence of significant acute traumatic injury to the skull, brain or cervical spine. 2. Moderate cerebral atrophy with extensive chronic microvascular ischemic changes in the cerebral white matter, as above. 3. Mild multilevel degenerative disc disease and cervical spondylosis. Electronically Signed   By: Trudie Reed M.D.   On: 02/26/2020 15:32   CT CHEST W CONTRAST  Result Date: 02/26/2020 CLINICAL DATA:  Abdominal trauma EXAM: CT CHEST, ABDOMEN, AND PELVIS WITH CONTRAST TECHNIQUE: Multidetector CT imaging of the chest, abdomen and pelvis was performed following the standard protocol during bolus administration of intravenous contrast. CONTRAST:  OMNIPAQUE IOHEXOL 300 MG/ML  SOLN COMPARISON:  None. FINDINGS: CT CHEST FINDINGS Cardiovascular: The heart size appears within normal limits. No pericardial effusion. Previous median sternotomy  and CABG procedure. Aortic atherosclerosis. Mediastinum/Nodes: No enlarged axillary, supraclavicular, mediastinal, or hilar lymph nodes. The thyroid gland, trachea and esophagus are unremarkable. Lungs/Pleura: No pleural effusion. Linear, parenchymal bands are noted within both lower lobes, likely the sequelae of chronic inflammation or infection. No acute airspace consolidation, atelectasis or pneumothorax identified at this time. Musculoskeletal: The thoracic vertebral body heights are all well preserved. There are multiple acute on chronic left posterior rib fracture deformities including the left 6, seventh, eighth, ninth, tenth, eleventh and twelfth ribs. The left eighth and ninth ribs fractures appear slightly comminuted. Multiple acute on chronic right rib fracture deformities are also noted including a right posterolateral ninth and tenth ribs, and posterior right eleventh and twelfth ribs. Fractures at the right 11 and twelfth costovertebral junctions are also noted. The sternum appears intact. The thoracic vertebral body heights appear well preserved. CT ABDOMEN PELVIS FINDINGS Hepatobiliary: No signs of hepatic injury or perihepatic hematoma. Status post cholecystectomy with mild increase caliber of the CBD. Pancreas: There is diffuse fatty replacement of the pancreas. No main duct dilatation, mass or inflammation noted. Spleen: No splenic injury or perisplenic hematoma. Adrenals/Urinary Tract: Normal right adrenal gland. 1.7 cm left adrenal gland myelolipoma is noted, image 59/3. Stomach/Bowel: No signs of renal injury. Inferior pole left kidney cyst measures 5.3 cm. Additional, bilateral low attenuation kidney lesions are too small to characterize measuring less than 1 cm. No hydronephrosis identified bilaterally. Urinary bladder is unremarkable. Stomach appears nondistended. There is mucosal enhancement and mild mural thickening involving the proximal duodenum. Here, there are 3 tiny foci of intramural  gas which may reflect peptic ulcer disease, image 58/3. No signs of pneumoperitoneum. The remaining bowel loops are unremarkable. Vascular/Lymphatic: Aortic atherosclerosis without aneurysm. No abdominopelvic adenopathy. Reproductive: Prostate is unremarkable. Other: No abdominal wall hernia or abnormality. No abdominopelvic ascites. Musculoskeletal: The lumbar vertebral body heights are all well preserved. No compression fractures identified. IMPRESSION: 1. Bilateral acute on chronic rib fracture deformities are identified. The left eighth and ninth ribs appear comminuted. Fractures at the right 11 and twelfth costovertebral junctions are also noted. 2. No evidence for solid organ injury or pneumothorax. 3. There is mucosal enhancement and mild mural thickening involving the proximal duodenum. 3 tiny  foci of intramural gas may reflect underlying peptic ulcer disease. No signs of pneumoperitoneum. 4. Left adrenal gland myelolipoma. 5. Aortic atherosclerosis. Aortic Atherosclerosis (ICD10-I70.0). Electronically Signed   By: Signa Kell M.D.   On: 02/26/2020 15:36   CT CERVICAL SPINE WO CONTRAST  Result Date: 02/26/2020 CLINICAL DATA:  69 year old male with history of trauma from a fall. EXAM: CT HEAD WITHOUT CONTRAST CT CERVICAL SPINE WITHOUT CONTRAST TECHNIQUE: Multidetector CT imaging of the head and cervical spine was performed following the standard protocol without intravenous contrast. Multiplanar CT image reconstructions of the cervical spine were also generated. COMPARISON:  Head and cervical spine CT 10/18/2019. FINDINGS: CT HEAD FINDINGS Brain: Moderate cerebral atrophy with ex vacuo dilatation of the ventricular system. Patchy and confluent areas of decreased attenuation are noted throughout the deep and periventricular white matter of the cerebral hemispheres bilaterally, compatible with chronic microvascular ischemic disease.No evidence of acute infarction, hemorrhage, hydrocephalus, extra-axial  collection or mass lesion/mass effect. Vascular: No hyperdense vessel or unexpected calcification. Skull: Normal. Negative for fracture or focal lesion. Sinuses/Orbits: No acute finding. Other: None. CT CERVICAL SPINE FINDINGS Alignment: Normal. Skull base and vertebrae: No acute fracture. No primary bone lesion or focal pathologic process. Soft tissues and spinal canal: No prevertebral fluid or swelling. No visible canal hematoma. Disc levels: Multilevel degenerative disc disease, most evident throughout the mid to lower cervical spine. Mild multilevel facet arthropathy. Upper chest: Unremarkable. Other: Old healed fracture of the medial aspect of the right clavicle with mild posttraumatic deformity. IMPRESSION: 1. No evidence of significant acute traumatic injury to the skull, brain or cervical spine. 2. Moderate cerebral atrophy with extensive chronic microvascular ischemic changes in the cerebral white matter, as above. 3. Mild multilevel degenerative disc disease and cervical spondylosis. Electronically Signed   By: Trudie Reed M.D.   On: 02/26/2020 15:32   CT ABDOMEN PELVIS W CONTRAST  Result Date: 02/26/2020 CLINICAL DATA:  Abdominal trauma EXAM: CT CHEST, ABDOMEN, AND PELVIS WITH CONTRAST TECHNIQUE: Multidetector CT imaging of the chest, abdomen and pelvis was performed following the standard protocol during bolus administration of intravenous contrast. CONTRAST:  OMNIPAQUE IOHEXOL 300 MG/ML  SOLN COMPARISON:  None. FINDINGS: CT CHEST FINDINGS Cardiovascular: The heart size appears within normal limits. No pericardial effusion. Previous median sternotomy and CABG procedure. Aortic atherosclerosis. Mediastinum/Nodes: No enlarged axillary, supraclavicular, mediastinal, or hilar lymph nodes. The thyroid gland, trachea and esophagus are unremarkable. Lungs/Pleura: No pleural effusion. Linear, parenchymal bands are noted within both lower lobes, likely the sequelae of chronic inflammation or  infection. No acute airspace consolidation, atelectasis or pneumothorax identified at this time. Musculoskeletal: The thoracic vertebral body heights are all well preserved. There are multiple acute on chronic left posterior rib fracture deformities including the left 6, seventh, eighth, ninth, tenth, eleventh and twelfth ribs. The left eighth and ninth ribs fractures appear slightly comminuted. Multiple acute on chronic right rib fracture deformities are also noted including a right posterolateral ninth and tenth ribs, and posterior right eleventh and twelfth ribs. Fractures at the right 11 and twelfth costovertebral junctions are also noted. The sternum appears intact. The thoracic vertebral body heights appear well preserved. CT ABDOMEN PELVIS FINDINGS Hepatobiliary: No signs of hepatic injury or perihepatic hematoma. Status post cholecystectomy with mild increase caliber of the CBD. Pancreas: There is diffuse fatty replacement of the pancreas. No main duct dilatation, mass or inflammation noted. Spleen: No splenic injury or perisplenic hematoma. Adrenals/Urinary Tract: Normal right adrenal gland. 1.7 cm left adrenal gland myelolipoma  is noted, image 59/3. Stomach/Bowel: No signs of renal injury. Inferior pole left kidney cyst measures 5.3 cm. Additional, bilateral low attenuation kidney lesions are too small to characterize measuring less than 1 cm. No hydronephrosis identified bilaterally. Urinary bladder is unremarkable. Stomach appears nondistended. There is mucosal enhancement and mild mural thickening involving the proximal duodenum. Here, there are 3 tiny foci of intramural gas which may reflect peptic ulcer disease, image 58/3. No signs of pneumoperitoneum. The remaining bowel loops are unremarkable. Vascular/Lymphatic: Aortic atherosclerosis without aneurysm. No abdominopelvic adenopathy. Reproductive: Prostate is unremarkable. Other: No abdominal wall hernia or abnormality. No abdominopelvic ascites.  Musculoskeletal: The lumbar vertebral body heights are all well preserved. No compression fractures identified. IMPRESSION: 1. Bilateral acute on chronic rib fracture deformities are identified. The left eighth and ninth ribs appear comminuted. Fractures at the right 11 and twelfth costovertebral junctions are also noted. 2. No evidence for solid organ injury or pneumothorax. 3. There is mucosal enhancement and mild mural thickening involving the proximal duodenum. 3 tiny foci of intramural gas may reflect underlying peptic ulcer disease. No signs of pneumoperitoneum. 4. Left adrenal gland myelolipoma. 5. Aortic atherosclerosis. Aortic Atherosclerosis (ICD10-I70.0). Electronically Signed   By: Signa Kellaylor  Stroud M.D.   On: 02/26/2020 15:36   US RENAL  Result Date: 02/27/2020 CLINICAL DATA:  Oliguria EXAM: RENAL / URINARY TRACT ULTRASOUND COMPLETE COMPARISON:  CT abdomen pelvis 02/26/2020 FINDINGS: Right Kidney: Renal measurements: 13.0 x 6.8 x 5.7 cm = volume: 264 mL. Echogenicity within normal limits. No hydronephrosis. There is a cyst in the lower pole measuring 1.9 cm. Left Kidney: Renal measurements: 13.7 x 6.3 x 5.8 cm = volume: 261 mL. Echogenicity within normal limits. No hydronephrosis. There are multiple renal cysts, largest in the lower pole measures 5.5 cm. Bladder: Appears normal for degree of bladder distention. Other: None. IMPRESSION: No acute finding sonographically in the bilateral kidneys or bladder. Bilateral renal cysts. Electronically Signed   By: Emmaline KluverNancy  Ballantyne M.D.   On: 02/27/2020 14:55   DG Chest Port 1 View  Result Date: 02/26/2020 CLINICAL DATA:  [age] year old male found down. EXAM: PORTABLE CHEST 1 VIEW COMPARISON:  06/21/2019 FINDINGS: The heart size and mediastinal contours are within normal limits. Both lungs are clear. Possible acute, mildly displaced posterolateral left tenth rib fracture. Similar appearing multilevel bilateral posterior chronic rib fracture deformities.  Median sternotomy wires in place. IMPRESSION: 1. Possible acute, minimally displaced left posterolateral tenth rib fracture. 2. No acute cardiopulmonary process. Electronically Signed   By: Marliss Cootsylan  Suttle MD   On: 02/26/2020 14:26   ECHOCARDIOGRAM COMPLETE  Result Date: 02/27/2020    ECHOCARDIOGRAM REPORT   Patient Name:   Jason [name] Date of Exam: 02/27/2020 Medical Rec #:  [id number]  Height:       70.0 in Accession #:    [id number] Weight:       240.0 lb Date of Birth:  07-07-1950  BSA:          2.255 m Patient Age:    69 years   BP:           111/85 mmHg Patient Gender: M          HR:           133 bpm. Exam Location:  Inpatient Procedure: 2D Echo, Cardiac Doppler, Color Doppler and Intracardiac            Opacification Agent STAT ECHO Indications:    Elevated troponin  History:  Patient has no prior history of Echocardiogram examinations.                 CAD, Prior CABG, Arrythmias:Atrial Fibrillation; Risk                 Factors:Diabetes and Hypertension.  Sonographer:    Ross Ludwig RDCS (AE) Referring Phys: [id number] Gust Rung  Sonographer Comments: Technically challenging study due to limited acoustic windows, Technically difficult study due to poor echo windows, suboptimal parasternal window, suboptimal apical window and no subcostal window. Limited patient mobility. IMPRESSIONS  1. There is global hypokinesis and LVEF 40-45%, RV is moderately dilated with moderately decreased systolic function. The function estimate is most probably underestimated by tachycardia 130 BPM during acquisition.  2. Left ventricular ejection fraction, by estimation, is 40 to 45%. The left ventricle has mildly decreased function. The left ventricle demonstrates global hypokinesis. There is moderate concentric left ventricular hypertrophy. Left ventricular diastolic function could not be evaluated.  3. Right ventricular systolic function is moderately reduced. The right ventricular size is moderately enlarged. The  estimated right ventricular systolic pressure is 23.1 mmHg.  4. Right atrial size was mildly dilated.  5. The mitral valve is normal in structure. No evidence of mitral valve regurgitation. No evidence of mitral stenosis.  6. Tricuspid valve regurgitation is mild to moderate.  7. The aortic valve is normal in structure. Aortic valve regurgitation is not visualized. Mild to moderate aortic valve sclerosis/calcification is present, without any evidence of aortic stenosis.  8. Aortic dilatation noted. There is mild dilatation of the ascending aorta, measuring 41 mm.  9. The inferior vena cava is normal in size with greater than 50% respiratory variability, suggesting right atrial pressure of 3 mmHg. Comparison(s): No prior Echocardiogram. FINDINGS  Left Ventricle: Left ventricular ejection fraction, by estimation, is 40 to 45%. The left ventricle has mildly decreased function. The left ventricle demonstrates global hypokinesis. Definity contrast agent was given IV to delineate the left ventricular  endocardial borders. The left ventricular internal cavity size was normal in size. There is moderate concentric left ventricular hypertrophy. Left ventricular diastolic function could not be evaluated due to atrial fibrillation. Left ventricular diastolic function could not be evaluated. Right Ventricle: The right ventricular size is moderately enlarged. No increase in right ventricular wall thickness. Right ventricular systolic function is moderately reduced. The tricuspid regurgitant velocity is 2.24 m/s, and with an assumed right atrial pressure of 3 mmHg, the estimated right ventricular systolic pressure is 23.1 mmHg. Left Atrium: Left atrial size was normal in size. Right Atrium: Right atrial size was mildly dilated. Pericardium: There is no evidence of pericardial effusion. Mitral Valve: The mitral valve is normal in structure. No evidence of mitral valve regurgitation. No evidence of mitral valve stenosis. Tricuspid  Valve: The tricuspid valve is normal in structure. Tricuspid valve regurgitation is mild to moderate. No evidence of tricuspid stenosis. Aortic Valve: The aortic valve is normal in structure. Aortic valve regurgitation is not visualized. Mild to moderate aortic valve sclerosis/calcification is present, without any evidence of aortic stenosis. Aortic valve mean gradient measures 2.0 mmHg. Aortic valve peak gradient measures 3.4 mmHg. Aortic valve area, by VTI measures 3.54 cm. Pulmonic Valve: The pulmonic valve was normal in structure. Pulmonic valve regurgitation is not visualized. No evidence of pulmonic stenosis. Aorta: The aortic root is normal in size and structure and aortic dilatation noted. There is mild dilatation of the ascending aorta, measuring 41 mm. Venous: The inferior vena cava is  normal in size with greater than 50% respiratory variability, suggesting right atrial pressure of 3 mmHg. IAS/Shunts: No atrial level shunt detected by color flow Doppler.  LEFT VENTRICLE PLAX 2D LVIDd:         3.90 cm LVIDs:         2.80 cm LV PW:         1.30 cm LV IVS:        1.70 cm LVOT diam:     2.00 cm LV SV:         45 LV SV Index:   20 LVOT Area:     3.14 cm  RIGHT VENTRICLE RV Basal diam:  3.50 cm RV Mid diam:    3.10 cm RV S prime:     8.03 cm/s TAPSE (M-mode): 1.0 cm LEFT ATRIUM             Index       RIGHT ATRIUM           Index LA diam:        3.50 cm 1.55 cm/m  RA Area:     24.80 cm LA Vol (A2C):   68.8 ml 30.50 ml/m RA Volume:   80.90 ml  35.87 ml/m LA Vol (A4C):   51.0 ml 22.61 ml/m LA Biplane Vol: 61.6 ml 27.31 ml/m  AORTIC VALVE AV Area (Vmax):    3.14 cm AV Area (Vmean):   2.56 cm AV Area (VTI):     3.54 cm AV Vmax:           91.80 cm/s AV Vmean:          71.600 cm/s AV VTI:            0.127 m AV Peak Grad:      3.4 mmHg AV Mean Grad:      2.0 mmHg LVOT Vmax:         91.70 cm/s LVOT Vmean:        58.400 cm/s LVOT VTI:          0.143 m LVOT/AV VTI ratio: 1.13  AORTA Ao Root diam: 4.20 cm Ao Asc  diam:  4.10 cm TRICUSPID VALVE TR Peak grad:   20.1 mmHg TR Vmax:        224.00 cm/s  SHUNTS Systemic VTI:  0.14 m Systemic Diam: 2.00 cm Tobias Alexander MD Electronically signed by Tobias Alexander MD Signature Date/Time: 02/27/2020/3:47:36 PM    Final      Discharge Instructions:   Signed: Steffanie Rainwater, MD 03/01/2020, 1:39 PM   Pager: [phone]

## 2020-03-01 NOTE — Progress Notes (Signed)
Inpatient Diabetes Program Recommendations  AACE/ADA: New Consensus Statement on Inpatient Glycemic Control (2015)  Target Ranges:  Prepandial:   less than 140 mg/dL      Peak postprandial:   less than 180 mg/dL (1-2 hours)      Critically ill patients:  140 - 180 mg/dL   Lab Results  Component Value Date   GLUCAP 247 (H) 03/01/2020   HGBA1C 12.8 (H) 02/26/2020    Review of Glycemic Control Results for KYCEN, SPALLA (MRN [id number]) as of 03/01/2020 09:56  Ref. Range 02/29/2020 07:53 02/29/2020 12:05 02/29/2020 16:49 02/29/2020 20:57 03/01/2020 08:17  Glucose-Capillary Latest Ref Range: 70 - 99 mg/dL 976 (H) 734 (H) 193 (H) 218 (H) 247 (H)     Inpatient Diabetes Program Recommendations:    Novolog 0-5 units QHS  Will continue to follow while inpatient.  Thank you, Dulce Sellar, RN, BSN Diabetes Coordinator Inpatient Diabetes Program [phone] (team pager from 8a-5p)

## 2020-03-02 DIAGNOSIS — L8990 Pressure ulcer of unspecified site, unspecified stage: Secondary | ICD-10-CM | POA: Insufficient documentation

## 2020-03-02 DIAGNOSIS — R778 Other specified abnormalities of plasma proteins: Secondary | ICD-10-CM

## 2020-03-02 LAB — GLUCOSE, CAPILLARY
Glucose-Capillary: 364 mg/dL — ABNORMAL HIGH (ref 70–99)
Glucose-Capillary: 101 mg/dL — ABNORMAL HIGH (ref 70–99)
Glucose-Capillary: 172 mg/dL — ABNORMAL HIGH (ref 70–99)
Glucose-Capillary: 230 mg/dL — ABNORMAL HIGH (ref 70–99)

## 2020-03-02 MED ORDER — INSULIN GLARGINE 100 UNIT/ML ~~LOC~~ SOLN
35.0000 [IU] | Freq: Every day | SUBCUTANEOUS | Status: DC
  Administered 2020-03-02 – 2020-03-08 (×7): 35 [IU] via SUBCUTANEOUS
  Filled 2020-03-02 (×8): qty 0.35

## 2020-03-02 NOTE — TOC Progression Note (Addendum)
Transition of Care [facility]) - Progression Note    Patient Details  Name: Jason [name] MRN: [id number] Date of Birth: 1950-09-25  Transition of Care [facility]) CM/SW Contact  Terrial Rhodes, LCSWA Phone Number: 03/02/2020, 2:40 PM  Clinical Narrative:     Update 12/16 - CSW received call from teena with [employer] with phone number for MD to call to do a peer to peer. CSW informed MD. MD confirmed that they are going to call to complete peer to peer.  CSW will continue to follow.  CSW spoke with [place] with [place] to check on status of insurance authorization for patient. Randa Lynn said the insurance was denied. CSW asked Randa Lynn if she received insurance information for peer to peer review. Randa Lynn is unsure CSW requested for Randa Lynn to follow up with insurance authorization and return call to CSW.  CSW will inform TOC supervisor.  CSW will continue    Expected Discharge Plan: Skilled Nursing Facility Barriers to Discharge: Continued Medical Work up  Expected Discharge Plan and Services Expected Discharge Plan: Skilled Nursing Facility       Living arrangements for the past 2 months: Single Family Home                                       Social Determinants of Health (SDOH) Interventions    Readmission Risk Interventions No flowsheet data found.

## 2020-03-02 NOTE — TOC Progression Note (Signed)
Transition of Care [facility]) - Progression Note    Patient Details  Name: Jason [name] MRN: [id number] Date of Birth: [birth year]  Transition of [facility]) CM/SW Contact  Carmina Miller, [place] Phone Number: 03/02/2020, 4:36 PM  Clinical Narrative:    CSW reached out to pt's son to advise that initial request for SNF was denied by insurance and a peer to peer will be done by MD.    Expected Discharge Plan: Skilled Nursing Facility Barriers to Discharge: Continued Medical Work up  Expected Discharge Plan and Services Expected Discharge Plan: Skilled Nursing Facility       Living arrangements for the past 2 months: Single Family Home                                       Social Determinants of Health (SDOH) Interventions    Readmission Risk Interventions No flowsheet data found.

## 2020-03-02 NOTE — Progress Notes (Signed)
Physical Therapy Treatment Patient Details Name: Jason [name] MRN: [id number] DOB: [birth year] Today's Date: 03/02/2020 late entry for tx performed on 03/01/20    History of Present Illness Pt adm after found down at home. Pt on the floor for extended time. Pt with DKA. Pt with lt rib fx's 8 and 9 and rt rib fx's 11-12. PMH - DM, HTN, CVA, CABG, afib with rvr, anxiety, depression.    PT Comments    Pt supine in bed.  He remains painful in B ribs greater on L side.  Pt able to progress to OOB and into standing.  Pt tolerated session well.  Increased time spent due to bowel incontinence and need to transfer to commode to have additional BM.  Continue to recommend snf at this time.    Follow Up Recommendations  SNF     Equipment Recommendations  Rolling walker with 5" wheels;Wheelchair (measurements PT);Wheelchair cushion (measurements PT)    Recommendations for Other Services       Precautions / Restrictions Precautions Precautions: Fall;Other (comment)    Mobility  Bed Mobility Overal bed mobility: Needs Assistance Bed Mobility: Supine to Sit;Sit to Supine Rolling: Max assist   Supine to sit: Max assist Sit to supine: Max assist;+2 for physical assistance   General bed mobility comments: Pt with poor balance once sitting edge of bed demonstrating LOB posterior this session.  Pt required assistance for LE advancement and trunk elevation.  Transfers Overall transfer level: Needs assistance Equipment used: Ambulation equipment used (sara stedy) Transfers: Sit to/from Stand Sit to Stand: Mod assist         General transfer comment: Mod assistance from seated surface.  Cues for hand placement on bar to pull into standing.  Pt performed multiple reps due to bowel incontinence and toilet transfer.  Ambulation/Gait Ambulation/Gait assistance:  (NT)               Stairs             Wheelchair Mobility    Modified Rankin (Stroke Patients Only)        Balance Overall balance assessment: Needs assistance   Sitting balance-Leahy Scale: Poor Sitting balance - Comments: LOB posterior Postural control: Posterior lean;Right lateral lean   Standing balance-Leahy Scale: Poor                              Cognition Arousal/Alertness: Awake/alert Behavior During Therapy: Flat affect Overall Cognitive Status: Impaired/Different from baseline Area of Impairment: Orientation;Memory;Following commands;Problem solving                 Orientation Level: Disoriented to;Time   Memory: Decreased short-term memory Following Commands: Follows one step commands with increased time     Problem Solving: Slow processing;Decreased initiation;Requires verbal cues        Exercises General Exercises - Lower Extremity Heel Slides: AAROM;Both;10 reps;Supine Shoulder Exercises Shoulder Flexion: AROM;AAROM;Both;10 reps;Supine Shoulder Extension: AROM;Both;10 reps;Supine;AAROM    General Comments        Pertinent Vitals/Pain Pain Assessment: Faces Faces Pain Scale: Hurts even more Pain Location: ribs L >R Pain Descriptors / Indicators: Guarding;Grimacing;Moaning    Home Living                      Prior Function            PT Goals (current goals can now be found in the care plan section) Acute Rehab PT Goals Patient  Stated Goal: to scratch my back Progress towards PT goals: Progressing toward goals    Frequency    Min 2X/week      PT Plan Current plan remains appropriate    Co-evaluation              AM-PAC PT "6 Clicks" Mobility   Outcome Measure  Help needed turning from your back to your side while in a flat bed without using bedrails?: Total Help needed moving from lying on your back to sitting on the side of a flat bed without using bedrails?: Total Help needed moving to and from a bed to a chair (including a wheelchair)?: Total Help needed standing up from a chair using your arms  (e.g., wheelchair or bedside chair)?: Total Help needed to walk in hospital room?: Total Help needed climbing 3-5 steps with a railing? : Total 6 Click Score: 6    End of Session Equipment Utilized During Treatment: Gait belt Activity Tolerance: Patient limited by fatigue Patient left: in bed;with call bell/phone within reach;with bed alarm set Nurse Communication: Mobility status (BM and stool incontinence.) PT Visit Diagnosis: Other abnormalities of gait and mobility (R26.89);History of falling (Z91.81);Muscle weakness (generalized) (M62.81);Pain Pain - Right/Left:  (bilateral) Pain - part of body:  (ribs)     Time:  - 1455-1540 on 03/01/20    Charges:                        Quentin Angst Acute Rehabilitation Services Pager [phone] Office [phone]     Chanin Frumkin Artis Delay 03/02/2020, 9:57 AM

## 2020-03-02 NOTE — Care Management Important Message (Signed)
Important Message  Patient Details  Name: Jason [name] MRN: [id number] Date of Birth: 22-Sep-1950   Medicare Important Message Given:  Yes     Renie Ora 03/02/2020, 12:03 PM

## 2020-03-02 NOTE — Progress Notes (Signed)
Subjective:   Hospital day: 6  Overnight event: NAEOV  This morning, patient was evaluated while laying in bed. Patient states he is doing well today. He continues to denies any CP or SOB. He does endorse ongoing left rib pain. Patient is alert and oriented but does not remember what he ate for breakfast.   Objective:  Vital signs in last 24 hours: Vitals:   03/01/20 1623 03/01/20 1937 03/01/20 2208 03/02/20 0705  BP: 111/62 122/77 135/84 128/81  Pulse: 66 86 88 69  Resp: 17 (!) 23  18  Temp: 97.8 F (36.6 C) 98.3 F (36.8 C)  97.6 F (36.4 C)  TempSrc: Oral Oral  Axillary  SpO2: 96% 98%  94%  Weight:    97.8 kg  Height:        Physical Exam  General: Pleasant, ill-appearing elderly man laying in bed. Awake but drowsy. NAD.  Head: Normocephalic. Atraumatic. CV: Regular rate. Irregular rhythm. No murmurs, rubs, or gallops. No lower extremity edema. Pulmonary:  Lungs CTAB.  Normal effort.  No wheezing or rales. Abdominal: Soft, nontender, nondistended. Normal bowel sounds. Extremities: Palpable pulses. Normal ROM.  Amputated right hallux. Cool extremities, worse on the left.  Skin: Warm and dry. No obvious rash or lesions.  Neuro: Alert & Oriented x 3. Moves all extremities. Normal sensation.  Psych: Normal mood and affect  Assessment/Plan: Jason [name] is a 69 y.o. male with PMH of T2DM, HTN anxiety/depression, previous CVA, CAD s/p CABG  who presented to the [facility] after a fall and was admitted for hyperglycemia secondary to medication nonadherence. Patient currently being treated for mixed picture of DKA/HHS. Clinically improving and HR now better controlled.   Principal Problem:   Hyperosmolar hyperglycemic state (HHS) (HCC) Active Problems:   DKA, type 2 (HCC)   Paroxysmal A-fib (HCC)   Elevated troponin   CAD (coronary artery disease), native coronary artery  DKA/HHS Patient was found to have hyperglycemia and AGMA on arrival. Patient's lab findings and history  consistent with a mixed picture of DKA and HHS. AG resolved. Blood sugar still not well-controlled. Morning and night time BS still in the 200s. Will increase basal insulin and monitor.  --Increase lantus to 35 u daily --Continue Novolog 7u TID w/ meals  --Continue SSI, Moderate --CBG monitoring --Daily BMP --Dysphagia 3 diet  A. Fib Patient recently diagnosed with A. fib and started on metoprolol for rate control and Eliquis for anticoagulation. ECHO on 12/12 showed EF of 40 to 45%, global hypokinesis, moderate LVH and moderately decreased RVSF. Slight drop of HR to the 50s but currently back to 60s and 70s.  --Continue metoprolol 100 mg BID --Continue Eliquis 5 mg daily.  --Discontinue tele  Elevated troponin Likely secondary to demand ischemia in the setting of stress from DKA/HHS. ECHO on 12/12 showed EF of 40 to 45%, global hypokinesis, moderate LVH and moderately decreased RVSF. Denies any CP or SOB.  Fall Patient with a history of falls presented after falling and laying down for an unknown duration. Patient reports hitting head with CT head negative for any acute intracranial abnormalities.  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 --Continue fall precautions --Continue PT/OT  HTN Patient has a history of hypertension on amlodipine, Benzapril, hydralazine and metoprolol. All antihypertensives were held on admission. BP now stable 128/81 on current regimen.  --Continue metoprolol 100 mg BID --Continue Benzapril 40 mg daily --Frequent vitals  CAD Hyperlipidemia Patient has a history of CAD status post CABG more  than 20 years ago. Initially started on aspirin and Plavix. Aspirin has been discontinued years ago. Plavix discontinued after recent diagnosis of A. Fib. No acute changes today. Denies any CP or SOB.  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 Reported suicidal ideation on  admission, stating that he has no resources at home, he is alone and has not been able to take care of himself. Patient is prescribed buspirone 10 mg, Zoloft 25 mg and trazodone 50 mg however patient does not take these medications. --Plan to restart meds once stable  Diet: Dysphagia 3 IVF: IV LR VTE: Eliquis CODE: Full code  Prior to Admission Living Arrangement: Home Anticipated Discharge Location: SNF placement Barriers to Discharge: Medical work-up and insurance auth Dispo: Anticipated discharge in approximately 1-2 day(s).   Signed: Steffanie Rainwater, MD 03/02/2020, 7:41 AM  Pager: [phone] Internal Medicine Teaching Service After 5pm on weekdays and 1pm on weekends: On Call pager: [phone]

## 2020-03-02 NOTE — Progress Notes (Signed)
Occupational Therapy Treatment Patient Details Name: Jason [name] MRN: [id number] DOB: 08-Nov-1950 Today's Date: 03/02/2020    History of present illness Pt adm after found down at home. Pt on the floor for extended time. Pt with DKA. Pt with lt rib fx's 8 and 9 and rt rib fx's 11-12. PMH - DM, HTN, CVA, CABG, afib with rvr, anxiety, depression.   OT comments  Pt making progress with functional goals. Pt required encouragement to participate. Pt sat EOB for grooming and UB ADL tasks with mod A - min guard A for balance/support, mod A with SPT to recliner and back to bed. OT will continue to follow acutely to maximize level of function and safety  Follow Up Recommendations  Supervision/Assistance - 24 hour;SNF    Equipment Recommendations   (TBD at next venue of care)    Recommendations for Other Services      Precautions / Restrictions Precautions Precautions: Fall Restrictions Weight Bearing Restrictions: No       Mobility Bed Mobility Overal bed mobility: Needs Assistance Bed Mobility: Supine to Sit;Sit to Supine Rolling: Max assist   Supine to sit: Max assist Sit to supine: Max assist;+2 for physical assistance   General bed mobility comments: Poor sitting balance and required mod A to support/steady pt to progress to min guard A  Transfers Overall transfer level: Needs assistance Equipment used: Ambulation equipment used Transfers: Sit to/from Stand Sit to Stand: Mod assist         General transfer comment: Mod assistance from seated surface.  Cues for hand placement on bar to pull into standing.  Pt performed multiple reps due to bowel incontinence and toilet transfer.    Balance Overall balance assessment: Needs assistance   Sitting balance-Leahy Scale: Poor   Postural control: Posterior lean;Right lateral lean   Standing balance-Leahy Scale: Poor                             ADL either performed or assessed with clinical judgement   ADL        Grooming: Wash/dry hands;Wash/dry face;Sitting;Min guard   Upper Body Bathing: Moderate assistance;Sitting Upper Body Bathing Details (indicate cue type and reason): simulated Lower Body Bathing: Maximal assistance   Upper Body Dressing : Moderate assistance;Sitting Upper Body Dressing Details (indicate cue type and reason): donned clean gown     Toilet Transfer: Moderate assistance;Stand-pivot Toilet Transfer Details (indicate cue type and reason): simulated Toileting- Clothing Manipulation and Hygiene: Total assistance         General ADL Comments: Pt required max encouragement to participate in sitting EOB for functional tasks     Vision Baseline Vision/History: Wears glasses Patient Visual Report: No change from baseline     Perception     Praxis      Cognition Arousal/Alertness: Awake/alert Behavior During Therapy: Flat affect Overall Cognitive Status: Impaired/Different from baseline Area of Impairment: Orientation;Memory;Following commands;Problem solving                     Memory: Decreased short-term memory Following Commands: Follows one step commands with increased time     Problem Solving: Slow processing;Decreased initiation;Requires verbal cues          Exercises     Shoulder Instructions       General Comments      Pertinent Vitals/ Pain       Pain Assessment: Faces Faces Pain Scale: Hurts even more Pain Location: ribs  L >R Pain Descriptors / Indicators: Guarding;Grimacing;Moaning Pain Intervention(s): Limited activity within patient's tolerance;Repositioned;Premedicated before session;Monitored during session  Home Living                                          Prior Functioning/Environment              Frequency  Min 2X/week        Progress Toward Goals  OT Goals(current goals can now be found in the care plan section)     Acute Rehab OT Goals Patient Stated Goal: go home  Plan       Co-evaluation                 AM-PAC OT "6 Clicks" Daily Activity     Outcome Measure   Help from another person eating meals?: None Help from another person taking care of personal grooming?: A Little Help from another person toileting, which includes using toliet, bedpan, or urinal?: Total Help from another person bathing (including washing, rinsing, drying)?: A Lot Help from another person to put on and taking off regular upper body clothing?: A Lot Help from another person to put on and taking off regular lower body clothing?: Total 6 Click Score: 13    End of Session Equipment Utilized During Treatment: Gait belt  OT Visit Diagnosis: Other abnormalities of gait and mobility (R26.89);Muscle weakness (generalized) (M62.81);History of falling (Z91.81);Other symptoms and signs involving cognitive function;Pain Pain - Right/Left: Left Pain - part of body:  (ribs area)   Activity Tolerance Patient limited by fatigue;Patient limited by pain   Patient Left in bed;with call bell/phone within reach;with bed alarm set   Nurse Communication          Time: 7124-5809 OT Time Calculation (min): 25 min  Charges: OT General Charges $OT Visit: 1 Visit OT Treatments $Self Care/Home Management : 8-22 mins $Therapeutic Activity: 8-22 mins     Galen Manila 03/02/2020, 4:08 PM

## 2020-03-02 NOTE — Plan of Care (Signed)
  Problem: Clinical Measurements: Goal: Diagnostic test results will improve Outcome: Progressing Goal: Respiratory complications will improve Outcome: Progressing   Problem: Nutrition: Goal: Adequate nutrition will be maintained Outcome: Progressing   Problem: Coping: Goal: Level of anxiety will decrease Outcome: Progressing   

## 2020-03-03 LAB — GLUCOSE, CAPILLARY
Glucose-Capillary: 142 mg/dL — ABNORMAL HIGH (ref 70–99)
Glucose-Capillary: 334 mg/dL — ABNORMAL HIGH (ref 70–99)
Glucose-Capillary: 265 mg/dL — ABNORMAL HIGH (ref 70–99)

## 2020-03-03 LAB — CULTURE, BLOOD (SINGLE)
Culture: NO GROWTH
Special Requests: ADEQUATE

## 2020-03-03 MED ORDER — ACETAMINOPHEN 500 MG PO TABS
1000.0000 mg | ORAL_TABLET | Freq: Three times a day (TID) | ORAL | Status: DC
  Administered 2020-03-03 – 2020-03-05 (×8): 1000 mg via ORAL
  Filled 2020-03-03 (×10): qty 2

## 2020-03-03 MED ORDER — LIDOCAINE 5 % EX PTCH
1.0000 | MEDICATED_PATCH | CUTANEOUS | Status: DC
  Administered 2020-03-03 – 2020-03-06 (×4): 1 via TRANSDERMAL
  Filled 2020-03-03 (×4): qty 1

## 2020-03-03 NOTE — Progress Notes (Addendum)
Subjective:   Hospital day: 6  Overnight event: NAEOV  This morning, patient states he is doing fine. He reports some back pain from laying on the bed. He continues to endorse left rib pain. Denies any CP, SOB or leg pain. He was informed that we are working with his insurance company to get him to a SNF for him to get rehab.   Objective:  Vital signs in last 24 hours: Vitals:   03/02/20 1958 03/02/20 2144 03/02/20 2334 03/03/20 0331  BP: 119/72 119/64 140/80 (!) 150/86  Pulse: 70 76 66 66  Resp: (!) 25  (!) 22 19  Temp: 98.1 F (36.7 C)  98.1 F (36.7 C) 97.7 F (36.5 C)  TempSrc: Oral  Oral Oral  SpO2: 98%  99% 90%  Weight:    99.5 kg  Height:        Physical Exam  General: Pleasant, ill-appearing elderly man sleepy in bed. No acute distress.  HEENT: [place]/AT. Some crusting on the corner of R eye. EOMI.  CV: Regular rate.  Irregular rhythm.  No M/R/G.  No LE edema. Pulmonary:  Lungs CTAB.  No wheezes or rales.  Normal effort Abdominal:  Soft, nontender, nondistended.  Normal bowel sounds. Extremities: Palpable pulses. Amputated right hallux. Cool extremities, worse on the left. Skin:  Warm and dry. Small area of bruising on the left flank.  Neuro: Drowsy but oriented to person, time and place. Moves all extremities. Normal sensation.  No focal neuro deficit. Able to maintain attention and focus.  Psych: Normal mood and affect.   Assessment/Plan: Jason [name] is a 69 y.o. male with PMH of T2DM, HTN anxiety/depression, previous CVA, CAD s/p CABG  who presented to the [facility] after a fall and was admitted for hyperglycemia secondary to medication nonadherence. Patient currently being treated for mixed picture of DKA/HHS. Clinically improving and HR now better controlled.  Pending peer-to-peer for SNF placement appeal.   Principal Problem:   Hyperosmolar hyperglycemic state (HHS) (HCC) Active Problems:   DKA, type 2 (HCC)   Paroxysmal A-fib (HCC)   Elevated troponin   CAD  (coronary artery disease), native coronary artery   Pressure injury of skin  DKA/HHS Patient was found to have hyperglycemia and AGMA on arrival. Patient's lab findings and history consistent with a mixed picture of DKA and HHS. Blood sugar better controlled with current regimen. Eating greater than 50% of his meals. Mental status stable, but no significant improvement from yesterday.  --Continue Lantus 35 mg daily --Continue NovoLog 7 units TID w/ meals --Continue SSI, moderate --CBG monitoring --Daily BMP --Pending peer-to-peer appeal for SNF placement  A. Fib Patient recently diagnosed with A. fib and started on metoprolol for rate control and Eliquis for anticoagulation. ECHO on 12/12 showed EF of 40 to 45%, global hypokinesis, moderate LVH and moderately decreased RVSF.  Heart rate better controlled and currently in the 60s and 70s. --Continue metoprolol 100 mg twice daily --Continue Eliquis 5 mg daily  Fall Patient with a history of falls presented after falling and laying down for an unknown duration. Patient reports hitting head with CT head negative for any acute intracranial abnormalities.  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 --Tylenol 1000 mg TID prn for pain --Start lidocaine 5% patch for left rib pain --Continue fall precautions --Up with assistance --Continue PT/OT ---Pending SNF placement  HTN Patient has a history of hypertension on amlodipine, Benzapril, hydralazine and metoprolol. All antihypertensives were held on admission. BP now stable  128/81 on current regimen.  --Continue metoprolol 100 mg BID --Continue Benzapril 40 mg daily  --Frequent vitals.  CAD Hyperlipidemia Elevated troponins Patient has a history of CAD status post CABG more than 20 years ago. Initially started on aspirin and Plavix. Aspirin has been discontinued years ago. Plavix discontinued after recent diagnosis of A. Fib. ECHO on 12/12 showed EF of 40  to 45%, global hypokinesis, moderate LVH and moderately decreased RVSF. Patient had elevated troponins earlier likely secondary to demand ischemia in the setting of stress from DKA/HHS. Continues to deny any CP or SOB. --Continue Crestor 20 mg daily  Anxiety/depression  Patient with worsening depression at home due to isolation. Reported suicidal ideation on admission, stating that he has no resources at home, he is alone and has not been able to take care of himself. Patient is prescribed buspirone 10 mg, Zoloft 25 mg and trazodone 50 mg however patient does not take these medications.  --Plan to restart meds once stable  Discharge disposition Patient's SNF placement to [facility] was rejected yesterday per CSW. MD was informed to call the peer-to-peer number. I called the number [phone], option 3) yesterday and was unable to talk to an MD after 40 mins on the phone. I also spent more than 20 min on the phone today without talking to an MD. This is delaying the patient's care. Informed CSW of this being a barrier to dispo. Needs a direct line to peer-to-peer MD.  --Claim needs to be appealed.  --Pending peer-to-peer number from CSW  Diet: Dysphagia 3 IVF: IV LR VTE: Eliquis CODE: Full code  Prior to Admission Living Arrangement: Home Anticipated Discharge Location: SNF placement Barriers to Discharge: Medical work-up and insurance auth Dispo: Anticipated discharge in approximately 1-2 day(s).   Signed: Steffanie Rainwater, MD 03/03/2020, 6:41 AM  Pager: [phone] Internal Medicine Teaching Service After 5pm on weekdays and 1pm on weekends: On Call pager: [phone]

## 2020-03-04 LAB — CBC
HCT: 43 % (ref 39.0–52.0)
Hemoglobin: 13.5 g/dL (ref 13.0–17.0)
MCH: 28.2 pg (ref 26.0–34.0)
MCHC: 31.4 g/dL (ref 30.0–36.0)
MCV: 89.8 fL (ref 80.0–100.0)
Platelets: 180 10*3/uL (ref 150–400)
RBC: 4.79 MIL/uL (ref 4.22–5.81)
RDW: 14.3 % (ref 11.5–15.5)
WBC: 6.4 10*3/uL (ref 4.0–10.5)
nRBC: 0 % (ref 0.0–0.2)

## 2020-03-04 LAB — GLUCOSE, CAPILLARY
Glucose-Capillary: 80 mg/dL (ref 70–99)
Glucose-Capillary: 136 mg/dL — ABNORMAL HIGH (ref 70–99)
Glucose-Capillary: 156 mg/dL — ABNORMAL HIGH (ref 70–99)
Glucose-Capillary: 190 mg/dL — ABNORMAL HIGH (ref 70–99)
Glucose-Capillary: 190 mg/dL — ABNORMAL HIGH (ref 70–99)

## 2020-03-04 MED ORDER — METOPROLOL TARTRATE 50 MG PO TABS
50.0000 mg | ORAL_TABLET | Freq: Two times a day (BID) | ORAL | Status: DC
  Administered 2020-03-04 – 2020-03-08 (×8): 50 mg via ORAL
  Filled 2020-03-04 (×8): qty 1

## 2020-03-04 MED ORDER — BUPROPION HCL 75 MG PO TABS
75.0000 mg | ORAL_TABLET | Freq: Every day | ORAL | Status: DC
  Administered 2020-03-04 – 2020-03-08 (×5): 75 mg via ORAL
  Filled 2020-03-04 (×5): qty 1

## 2020-03-04 MED ORDER — BUPROPION HCL 100 MG PO TABS
100.0000 mg | ORAL_TABLET | Freq: Two times a day (BID) | ORAL | Status: DC
  Filled 2020-03-04: qty 1

## 2020-03-04 MED ORDER — BUPROPION HCL 100 MG PO TABS: 100.0000 mg | ORAL_TABLET | Freq: Three times a day (TID) | ORAL | Status: DC

## 2020-03-04 MED ORDER — BUPROPION HCL ER (XL) 150 MG PO TB24: 150.0000 mg | ORAL_TABLET | Freq: Every day | ORAL | Status: DC

## 2020-03-04 NOTE — Progress Notes (Signed)
Subjective:   Hospital day: 7  Overnight event:  Hypotensive to 90s/40s with repeat 100s/40s. Held metoprolol dose. Otherwise no acute overnight events.  Patient reports feeling good today. Discussed that we are still awaiting insurance authorization and SNF placement. He agrees with plan. No questions or concerns at this time.  Also checked patient for pressure injuries at sensitive areas, but none seen.  Otherwise patient is medically stable for discharge, only awaiting SNF placement.  Objective:  Vital signs in last 24 hours: Vitals:   03/03/20 1555 03/03/20 2147 03/03/20 2333 03/04/20 0402  BP: 123/70 107/60 (!) 163/75 106/62  Pulse: 66 86 64 63  Resp: 18 18 18 20   Temp: 97.7 F (36.5 C) 97.9 F (36.6 C) 97.8 F (36.6 C) 98.3 F (36.8 C)  TempSrc: Oral Oral Oral Oral  SpO2: 98% 96% 99% 97%  Weight:      Height:        Physical Exam  Physical Exam Constitutional:      Appearance: He is obese. He is not ill-appearing.  HENT:     Head: Normocephalic and atraumatic.     Mouth/Throat:     Mouth: Mucous membranes are moist.     Pharynx: Oropharynx is clear.  Eyes:     General: No scleral icterus.    Extraocular Movements: Extraocular movements intact.     Conjunctiva/sclera: Conjunctivae normal.     Pupils: Pupils are equal, round, and reactive to light.  Cardiovascular:     Rate and Rhythm: Normal rate and regular rhythm.     Pulses: Normal pulses.     Heart sounds: Normal heart sounds. No murmur heard. No friction rub. No gallop.   Pulmonary:     Effort: Pulmonary effort is normal.     Breath sounds: Normal breath sounds. No wheezing, rhonchi or rales.  Abdominal:     General: Bowel sounds are normal. There is no distension.     Palpations: Abdomen is soft.     Tenderness: There is no abdominal tenderness. There is no guarding or rebound.  Musculoskeletal:        General: No swelling. Normal range of motion.     Cervical back: Normal range of motion.   Skin:    General: Skin is warm and dry.     Capillary Refill: Capillary refill takes less than 2 seconds.  Neurological:     General: No focal deficit present.     Mental Status: He is alert and oriented to person, place, and time.     Motor: No weakness.  Psychiatric:        Mood and Affect: Mood normal.        Behavior: Behavior normal.        Thought Content: Thought content normal.     Assessment/Plan: Jason [name] is a 69 y.o. male with PMH of T2DM, HTN anxiety/depression, previous CVA, CAD s/p CABG  who presented to the [facility] after a fall and was admitted for hyperglycemia secondary to medication nonadherence. Medically stable for discharge now. However, awaiting peer-to-peer for SNF placement appeal.   Principal Problem:   Hyperosmolar hyperglycemic state (HHS) (HCC) Active Problems:   DKA, type 2 (HCC)   Paroxysmal A-fib (HCC)   Elevated troponin   CAD (coronary artery disease), native coronary artery   Pressure injury of skin  DKA/HHS - Resolved Patient was found to have hyperglycemia and AGMA on arrival. Patient's lab findings and history consistent with a mixed picture of DKA and HHS. Blood  sugar better controlled with current regimen. --Continue Lantus 35 mg daily --Continue NovoLog 7 units TID w/ meals --Continue SSI, moderate --CBG monitoring --Pending peer-to-peer appeal for SNF placement  A. Fib Patient recently diagnosed with A. fib and started on metoprolol for rate control and Eliquis for anticoagulation. ECHO on 12/12 showed EF of 40 to 45%, global hypokinesis, moderate LVH and moderately decreased RVSF. Heart rate well controlled. --Reduced metoprolol to 50mg  BID due to soft blood pressures (adjust as necessary)  --Continue Eliquis 5 mg daily  Fall Patient with a history of falls presented after falling and laying down for an unknown duration. Patient reports hitting head with CT head negative for any acute intracranial abnormalities.  -- for  Firefighter rejected --Pending peer-to-peer appeal. --Tylenol 1000 mg TID prn for pain --continue lidocaine 5% patch prn for left rib pain --Continue fall precautions --Up with assistance --Continue PT/OT --Pending SNF placement  HTN Patient has a history of hypertension on amlodipine, Benzapril, hydralazine and metoprolol. BP now stable 128/81 on current regimen. Soft blood pressures today (90s/40s with repeat 100s/50s). --Reduced metoprolol dose to 50mg  BID due to soft BPs (can adjust as necessary) --Continue Benzapril 40 mg daily   CAD Hyperlipidemia Elevated troponins Patient has a history of CAD status post CABG more than 20 years ago. Initially started on aspirin and Plavix. Aspirin has been discontinued years ago. Plavix discontinued after recent diagnosis of A. Fib. ECHO on 12/12 showed EF of 40 to 45%, global hypokinesis, moderate LVH and moderately decreased RVSF. Patient had elevated troponins earlier likely secondary to demand ischemia in the setting of stress from DKA/HHS. Continues to deny any CP or SOB. --Continue Crestor 20 mg daily  Anxiety/depression  Patient with worsening depression at home due to isolation. Reported suicidal ideation on admission, stating that he has no resources at home, he is alone and has not been able to take care of himself. Patient is prescribed buspirone 10 mg, Zoloft 25 mg and trazodone 50 mg however patient does not take these medications.  --Starting wellbutrin 100mg  BID for 3 days and then wellbutrin 100mg  TID  Discharge disposition Patient's SNF placement to 02-20-1979 was rejected yesterday per CSW. MD was informed to call the peer-to-peer number. I called the number [phone], option 3) yesterday and was unable to talk to an MD after 40 mins on the phone. I also spent more than 20 min on the phone today without talking to an MD. This is delaying the patient's care. Informed CSW of this being a barrier to dispo. Needs a direct  line to peer-to-peer MD.  --Claim needs to be appealed.  --Pending peer-to-peer number from CSW  Diet: Dysphagia 3 IVF: IV LR VTE: Eliquis CODE: Full code  Prior to Admission Living Arrangement: Home Anticipated Discharge Location: pending SNF Barriers to Discharge: Medical workup and insurance authorization Dispo: Anticipated discharge in approximately 1-2 days.   Signed: , MD 03/04/2020, 6:10 AM  Pager: [phone] Internal Medicine Teaching Service After 5pm on weekdays and 1pm on weekends: On Call pager: [phone]

## 2020-03-04 NOTE — TOC Progression Note (Signed)
Transition of Care [facility]) - Progression Note    Patient Details  Name: Jason [name] MRN: [id number] Date of Birth: [birth year]  [facility] CM/SW Contact  Patrice [place], [place] Phone Number: [phone] 03/04/2020, 3:15 PM  Clinical Narrative:    CSW attempted to follow up on authorization however was unable to reach anyone. CSW will follow up again tomorrow.  TOC team will continue to assist with discharge planning needs.   Expected Discharge Plan: Skilled Nursing Facility Barriers to Discharge: Continued Medical Work up  Expected Discharge Plan and Services Expected Discharge Plan: Skilled Nursing Facility       Living arrangements for the past 2 months: Single Family Home                                       Social Determinants of Health (SDOH) Interventions    Readmission Risk Interventions No flowsheet data found.

## 2020-03-05 LAB — GLUCOSE, CAPILLARY
Glucose-Capillary: 158 mg/dL — ABNORMAL HIGH (ref 70–99)
Glucose-Capillary: 150 mg/dL — ABNORMAL HIGH (ref 70–99)
Glucose-Capillary: 100 mg/dL — ABNORMAL HIGH (ref 70–99)
Glucose-Capillary: 229 mg/dL — ABNORMAL HIGH (ref 70–99)

## 2020-03-05 NOTE — Progress Notes (Signed)
Subjective:   Hospital day: 8  Overnight event: NAEOV  This AM, patient was evaluated after just waking up from bed. States he feels better than he did yesterday. He continues to report left rib pain that is worse with movement. States the lidocaine patch helps a little but has not taking the pain completely away. He denies any CP, dizziness, SOB or calf pain.  Objective:  Vital signs in last 24 hours: Vitals:   03/04/20 0857 03/04/20 1324 03/04/20 1808 03/04/20 1940  BP: (!) 100/53 117/66  121/70  Pulse:    (!) 109  Resp:   20 20  Temp: (!) 96.2 F (35.7 C) 98 F (36.7 C) (!) 97.5 F (36.4 C) 98.1 F (36.7 C)  TempSrc: Oral Oral Axillary Oral  SpO2:    96%  Weight:      Height:        Physical Exam  General: Pleasant, ill-appearing elderly man laying in bed. NAD CV: Regular rate. Irregular rhythm. No m/r/g. No LE Edema Pulmonary: Lungs CTAB. No wheezing or rales. Normal effort. Abd: Soft, nontender. Non-distended. Normal BS.  Extremities: Palpable pulses. Amputated R hallux. Still cool to the touch.  Skin: Warm and dry. No obvious rashes or lesions Neuro: A&Ox3. No focal neuro deficits.  Psych: More alert and engaged.   Assessment/Plan: Jason [name] is a 69 y.o. male with PMH of T2DM, HTN anxiety/depression, previous CVA, CAD s/p CABG  who presented to the [facility] after a fall and was admitted for hyperglycemia secondary to medication nonadherence.  Patient was treated for mixed picture of DKA/HHS.  Blood sugar now well-controlled on current regimen. Afib well-controlled with beta-blocker. Pending SNF placement.  Principal Problem:   Hyperosmolar hyperglycemic state (HHS) (HCC) Active Problems:   DKA, type 2 (HCC)   Paroxysmal A-fib (HCC)   Elevated troponin   CAD (coronary artery disease), native coronary artery   Pressure injury of skin  DKA/HHS, resolved Patient was found to have hyperglycemia and AGMA on arrival.  Blood sugar much better controlled. CBG range of  80-190 in last 2 days.  --Continue Lantus 35 mg daily --Continue NovoLog 7 units w/ meals --Continue SSI, moderate --CBG monitoring --Encourage po fluid intake  A. Fib Patient recently diagnosed with A. fib and started on metoprolol for rate control and Eliquis for anticoagulation. ECHO on 12/12 showed EF of 40 to 45%, global hypokinesis, moderate LVH and moderately decreased RVSF. Heart rate well controlled and currently in the 60s and 70s. Continue to monitor closely. --Continue metoprolol 50 mg twice daily --Continue Eliquis 5 mg daily --Telemetry  Fall Patient with a history of falls presented after falling and laying down for an unknown duration. Patient reports hitting head with CT head negative for any acute intracranial abnormalities. No acute changes. Patient continues to endorse left rib pain but it is a little bit better.  --Continue fall precautions --Continue lidocaine patch --Continue prn Tylenol --Continue PT/OT --Pending appeal for SNF placement.   HTN Patient has a history of hypertension on amlodipine, Benzapril, hydralazine and metoprolol.  BP improved to 121/70 this morning. Continue to monitor closely. --Continue metoprolol 50 mg twice daily --Continue benazepril 40 mg daily --Daily vitals  CAD Hyperlipidemia Elevated troponins, resolved  Stable. Not on aspirin or Plavix. --Continue Crestor 20 mg daily  Anxiety/depression  All antidepressant medications held at admission. Patient's mood has not significantly improve during hospitalization. Today, patient more engaging and alert.   --Continue to monitor mood --Continue bupropion 75 mg daily  Discharge disposition Patient's SNF placement to [facility] was rejected last week. MD waiting for more information from CSW on appeal process. During admission, pt reported SI if discharged home due to being alone. --Pending appeal of rejected claim  Diet: Dysphagia 3 IVF: None VTE: Eliquis CODE: Full  code  Prior to Admission Living Arrangement: Home Anticipated Discharge Location: SNF placement Barriers to Discharge: Medical work-up and insurance auth Dispo: Anticipated discharge in approximately 1-2 day(s).   Signed: Steffanie Rainwater, MD 03/05/2020, 6:04 AM  Pager: [phone] Internal Medicine Teaching Service After 5pm on weekdays and 1pm on weekends: On Call pager: [phone]

## 2020-03-05 NOTE — TOC Progression Note (Signed)
Transition of Care [facility]) - Progression Note    Patient Details  Name: Jason [name] MRN: [id number] Date of Birth: 01-21-1951  [facility]) CM/SW Contact  Patrice [place], [place] Phone Number: [phone] 03/05/2020, 11:20 AM  Clinical Narrative:     CSW spoke with [place] from [place] and she informed CSW that the peer to peer information was given to the RN to give to patient's MD. Randa Lynn stated that she has not heard anything about how the peer to peer.  TOC will continue to follow up on authorization.   Expected Discharge Plan: Skilled Nursing Facility Barriers to Discharge: Continued Medical Work up  Expected Discharge Plan and Services Expected Discharge Plan: Skilled Nursing Facility       Living arrangements for the past 2 months: Single Family Home                                       Social Determinants of Health (SDOH) Interventions    Readmission Risk Interventions No flowsheet data found.

## 2020-03-06 LAB — GLUCOSE, CAPILLARY
Glucose-Capillary: 140 mg/dL — ABNORMAL HIGH (ref 70–99)
Glucose-Capillary: 164 mg/dL — ABNORMAL HIGH (ref 70–99)
Glucose-Capillary: 168 mg/dL — ABNORMAL HIGH (ref 70–99)
Glucose-Capillary: 136 mg/dL — ABNORMAL HIGH (ref 70–99)

## 2020-03-06 MED ORDER — LIDOCAINE 5 % EX PTCH
2.0000 | MEDICATED_PATCH | CUTANEOUS | Status: DC
  Administered 2020-03-07 – 2020-03-08 (×2): 2 via TRANSDERMAL
  Filled 2020-03-06 (×2): qty 2

## 2020-03-06 NOTE — Progress Notes (Signed)
Inpatient Diabetes Program Recommendations  AACE/ADA: New Consensus Statement on Inpatient Glycemic Control (2015)  Target Ranges:  Prepandial:   less than 140 mg/dL      Peak postprandial:   less than 180 mg/dL (1-2 hours)      Critically ill patients:  140 - 180 mg/dL   Lab Results  Component Value Date   GLUCAP 164 (H) 03/06/2020   HGBA1C 12.8 (H) 02/26/2020    Review of Glycemic Control Results for Jason [name], Jason [name] (MRN [id number]) as of 03/06/2020 12:54  Ref. Range 03/05/2020 21:51 03/06/2020 07:39 03/06/2020 11:19  Glucose-Capillary Latest Ref Range: 70 - 99 mg/dL 829 (H) 562 (H) 130 (H)   Diabetes history:  DM2 Outpatient Diabetes medications:  Basaglar 30 units daily (Not taking) Humalog 2-12 units TID (Not taking) Current orders for Inpatient glycemic control:  Lantus 35 units daily Novolog 7 units TID with meals  Novolog 0-15 units TID with meals  Inpatient Diabetes Program Recommendations:     Spoke with patient regarding outpatient diabetes management. Patient reports that he was shown how to do the insulin pen, but since leaving [place] he feels that he is "too unable to do it". Offered to demonstrate again insulin pen; patient refusing stating, "I just cannot do it and wont be able to do it when home."  Per MD notes, patient reported SI if to return home. Given social work note today, home health being set up. Concern for patient returning home with home health. Patient not able to perform self care/self injections.  Discussed with Dr Kirke Corin concern for self care at home, having no neighbors/family to assist and high risk for readmit. MD also sharing same opinion on disposition and need for advanced level of care.  Also, discussed with RN to reach out further if needs shall arise with further teaching.   Thanks, Lujean Rave, MSN, RNC-OB Diabetes Coordinator [phone] (8a-5p)

## 2020-03-06 NOTE — Care Management Important Message (Signed)
Important Message  Patient Details  Name: Jason [name] MRN: [id number] Date of Birth: 1950-07-19   Medicare Important Message Given:  Yes     Renie Ora 03/06/2020, 11:40 AM

## 2020-03-06 NOTE — Progress Notes (Signed)
Physical Therapy Treatment Patient Details Name: Jason [name] MRN: [id number] DOB: 1950-04-25 Today's Date: 03/06/2020    History of Present Illness Pt adm after found down at home. Pt on the floor for extended time. Pt with DKA. Pt with lt rib fx's 8 and 9 and rt rib fx's 11-12. PMH - DM, HTN, CVA, CABG, afib with rvr, anxiety, depression.    PT Comments    Pt seen for second session to continue working on ambulation. As stated in earlier note pt making steady progress and feel he could benefit from SNF to further improve his mobility and safety and decr his fall risk. Returning home at this point is likely to result in failure from PT standpoint.    Follow Up Recommendations  SNF     Equipment Recommendations  Rolling walker with 5" wheels;Wheelchair (measurements PT);Wheelchair cushion (measurements PT)    Recommendations for Other Services       Precautions / Restrictions Precautions Precautions: Fall    Mobility  Bed Mobility               General bed mobility comments: Pt up on BSC  Transfers Overall transfer level: Needs assistance Equipment used: Rolling walker (2 wheeled) Transfers: Sit to/from Stand Sit to Stand: Min assist         General transfer comment: assist to bring hips up and for balance from [facility]. Verbal cues for hand placement  Ambulation/Gait Ambulation/Gait assistance: Min assist Gait Distance (Feet): 12 Feet Assistive device: Rolling walker (2 wheeled) Gait Pattern/deviations: Step-through pattern;Decreased step length - right;Decreased step length - left;Shuffle;Wide base of support;Trunk flexed Gait velocity: decr Gait velocity interpretation: <1.8 ft/sec, indicate of risk for recurrent falls General Gait Details: Assist for balance and support. Verbal cues to stand more erect.  As distance increases pt with incr bil knee flexion.   Stairs             Wheelchair Mobility    Modified Rankin (Stroke Patients Only)        Balance Overall balance assessment: Needs assistance Sitting-balance support: No upper extremity supported;Feet supported Sitting balance-Leahy Scale: Fair     Standing balance support: Bilateral upper extremity supported Standing balance-Leahy Scale: Poor Standing balance comment: walker and min assist for static standing. Stood multiple times for [employer] Arousal/Alertness: Awake/alert Behavior During Therapy: Flat affect Overall Cognitive Status: Impaired/Different from baseline Area of Impairment: Memory;Following commands;Problem solving                     Memory: Decreased short-term memory Following Commands: Follows one step commands with increased time;Follows one step commands consistently     Problem Solving: Slow processing;Requires verbal cues        Exercises      General Comments General comments (skin integrity, edema, etc.): HR to 130 transiently      Pertinent Vitals/Pain Pain Assessment: Faces Faces Pain Scale: Hurts a little bit Pain Location: neck Pain Descriptors / Indicators: Sore Pain Intervention(s): Limited activity within patient's tolerance;Repositioned    Home Living                      Prior Function            PT Goals (current goals can now be found in the care plan section) Acute Rehab PT  Goals Patient Stated Goal: go home PT Goal Formulation: With patient Time For Goal Achievement: 03/20/20 Potential to Achieve Goals: Fair Progress towards PT goals: Progressing toward goals    Frequency    Min 3X/week      PT Plan Current plan remains appropriate;Frequency needs to be updated    Co-evaluation              AM-PAC PT "6 Clicks" Mobility   Outcome Measure  Help needed turning from your back to your side while in a flat bed without using bedrails?: A Lot Help needed moving from lying on your back to sitting on the side of a flat bed without  using bedrails?: A Lot Help needed moving to and from a bed to a chair (including a wheelchair)?: A Little Help needed standing up from a chair using your arms (e.g., wheelchair or bedside chair)?: A Little Help needed to walk in hospital room?: A Little Help needed climbing 3-5 steps with a railing? : Total 6 Click Score: 14    End of Session Equipment Utilized During Treatment: Gait belt Activity Tolerance: Patient tolerated treatment well Patient left: with call bell/phone within reach;in chair;with chair alarm set Nurse Communication: Mobility status PT Visit Diagnosis: Other abnormalities of gait and mobility (R26.89);History of falling (Z91.81);Muscle weakness (generalized) (M62.81)     Time: 1638-4665 PT Time Calculation (min) (ACUTE ONLY): 14 min  Charges:  $Gait Training: 8-22 mins                     [facility] PT Acute Rehabilitation Services Pager [phone] Office [phone]    [name] 03/06/2020, 3:23 PM

## 2020-03-06 NOTE — Progress Notes (Signed)
Subjective:   Hospital day: 9  Overnight event: NAEOV  This AM, patient states his pain improved with last medication but not well-controlled. Reports some rib pain from his fall. Denies arm, chest, shoulder, hip, or knee pain. He was able to eat his breakfast this AM without difficulty. He was able to have multiple bowel movements earlier this morning. He was informed that the team is running into a barrier with his SNF placement due to her insurance company rejecting his claim. He was assured that we will do everything we can to get him a SNF so he can get the rehab he needs. Patient was thankful of our help.   Objective:  Vital signs in last 24 hours: Vitals:   03/05/20 2040 03/06/20 0322 03/06/20 0821 03/06/20 1100  BP: (!) 144/76 (!) 141/78 (!) 150/78 96/61  Pulse: 66  62   Resp: 18     Temp: 98.7 F (37.1 C) 97.9 F (36.6 C)    TempSrc: Oral Oral    SpO2: 98% 97%    Weight:      Height:        Physical Exam  General: Pleasant, ill-appearing elderly man in bed. No acute distress CV: Regular rate. Irregular rhythm. No m/r/g. No LE edema Pulmonary: Lungs CTAB. No wheezing or rales. Abd: Soft, nontender, nondistended. Normal BS. Extremities: Palpable pulses. Amputated R hallux. Extremities warm to touch. Skin: Warm and dry. Neuro: A&Ox3. Moves all extremities. No focal neuro deficits. Strength 4/5 in all extremities. Psych: Normal mood and affect. Thankful.    Assessment/Plan: Jason [name] is a 69 y.o. male with PMH of T2DM, HTN anxiety/depression, previous CVA, CAD s/p CABG  who presented to the [facility] after a fall and was admitted for hyperglycemia secondary to medication nonadherence. Patient was treated for mixed picture of DKA/HHS. Blood sugar now well-controlled on current regimen. Afib well-controlled with beta-blocker. SNF placement denied. Expedited appeal in process.   Principal Problem:   Hyperosmolar hyperglycemic state (HHS) (HCC) Active Problems:   DKA, type  2 (HCC)   Paroxysmal A-fib (HCC)   Elevated troponin   CAD (coronary artery disease), native coronary artery   Pressure injury of skin  Fall Deconditioned Patient with a history of falls presented after falling and laying down for an unknown duration. Patient reports hitting head but CT head negative for any acute intracranial abnormalities. Patient continues to have left rib pain limiting mobility. Patient requiring 2-3 staff members in addition to assistive device in order to get out of bed. Patient is at a very increased risk of falling and it is unsafe for him to go home  Where he has no support. Patient requires acute rehab to improve strength and mobility.  --Authorization for SNF denied, peer-to-peer expired --Pending expedited review --Continue PT/OT --Lidocaine patch, IV dilaudid and Tylenol for pain --Fall precautions --Up with assistance  Anxiety/depression  On Trazodone, buspar and sertraline at home. Patient's mood much improved after starting buproprion. Will titrate up slowly as needed.   --Continue Buproprion to 75 mg daily  DKA/HHS, resolved A1c of 12.8. Patient was found to have hyperglycemia and AGMA on arrival. CBG 140 this morning. Will watch closely and adjust insulin regimen as needed. --Continue Lantus 35u daily --Continue Novolog 7u TID w/ meals --Continue SSI, moderate  A. Fib Stable. On beta blocker for rate control and Eliquis for anticoagulation. HR rate has range from 60s-90s in the last few days. --Continue metoprolol 50 mg BID --Continue Eliquis 5 mg BID  HTN Stable. BP 120s-160s/70-80s in the last 2 days. Continue to watch.  --Continue metoprolol 50 mg BID --Continue benazepril 40 mg daily --Daily vitals  CAD Hyperlipidemia Elevated troponins, resolved  Chronic and stable. Not on Plavix or ASA. --Continue Crestor 20 mg daily  Discharge disposition Patient's SNF placement to [place] was rejected last week. Patient does not have any  support at home to go home w/ home health. He will require 24 hr care to improve functional status. Peer-to-peer expired. Called and applied for expedited appeal which I was told can take up to 72 hrs.  --Appeal reference # [id number] --New medical records needs to be faxed to [phone]   Diet: Dysphagia 3 IVF: None VTE: Eliquis CODE: Full code  Prior to Admission Living Arrangement: Home Anticipated Discharge Location: SNF placement Barriers to Discharge: Medical work-up and insurance auth Dispo: Anticipated discharge in approximately 1-2 day(s).   Signed: Steffanie Rainwater, MD 03/06/2020, 7:03 AM  Pager: [phone] Internal Medicine Teaching Service After 5pm on weekdays and 1pm on weekends: On Call pager: [phone]

## 2020-03-06 NOTE — TOC Progression Note (Signed)
Transition of Care [facility]) - Progression Note    Patient Details  Name: Jason [name] MRN: [id number] Date of Birth: [birth year]  Transition [facility] Care [facility]) CM/SW Contact  Terrial Rhodes, LCSWA Phone Number: 03/06/2020, 9:59 AM  Clinical Narrative:      CSW spoke with patients son Jason [name] about insurance denial for SNF placement. Patients son is interested in Home Health services for patient. CSW let patients son know that case manager will follow up with him to set up home health services for patient.CSW has informed case Production designer, theatre/television/film.  CSW will continue to follow.   Expected Discharge Plan: Skilled Nursing Facility Barriers to Discharge: Continued Medical Work up  Expected Discharge Plan and Services Expected Discharge Plan: Skilled Nursing Facility       Living arrangements for the past 2 months: Single Family Home                                       Social Determinants of Health (SDOH) Interventions    Readmission Risk Interventions No flowsheet data found.

## 2020-03-06 NOTE — TOC Progression Note (Signed)
Transition of Care [facility]) - Progression Note    Patient Details  Name: Gerber Penza MRN: [id number] Date of Birth: 1950-12-08  Transition [facility]) CM/SW Contact  Terrial Rhodes, LCSWA Phone Number: 03/06/2020, 5:19 PM  Clinical Narrative:      MD complete appeal for SNF placement. The appeal # is [id number]. MD informed CSW that it could take up to 72 hours for determination.  Appeal approval pending. Patient has SNF bed at [facility].  CSW will continue to follow.     Expected Discharge Plan: Skilled Nursing Facility Barriers to Discharge: Continued Medical Work up  Expected Discharge Plan and Services Expected Discharge Plan: Skilled Nursing Facility       Living arrangements for the past 2 months: Single Family Home                                       Social Determinants of Health (SDOH) Interventions    Readmission Risk Interventions No flowsheet data found.

## 2020-03-06 NOTE — Plan of Care (Signed)
  Problem: Clinical Measurements: Goal: Ability to maintain clinical measurements within normal limits will improve Outcome: Progressing Goal: Will remain free from infection Outcome: Progressing   Problem: Activity: Goal: Risk for activity intolerance will decrease Outcome: Progressing   Problem: Nutrition: Goal: Adequate nutrition will be maintained Outcome: Progressing   

## 2020-03-06 NOTE — Progress Notes (Signed)
Physical Therapy Treatment Patient Details Name: Jason [name] MRN: [id number] DOB: 12/07/1950 Today's Date: 03/06/2020    History of Present Illness Pt adm after found down at home. Pt on the floor for extended time. Pt with DKA. Pt with lt rib fx's 8 and 9 and rt rib fx's 11-12. PMH - DM, HTN, CVA, CABG, afib with rvr, anxiety, depression.    PT Comments    Pt making steady progress with mobility and now able to amb short distance with assistance. Pt continues to be a high fall risk with mobility and if he returns home at this point I expect he will not be successful. Strongly recommend further rehab at SNF to enable him to reach a more independent level and reduce his fall risk. Pt is willing and motivated to work.    Follow Up Recommendations  SNF     Equipment Recommendations  Rolling walker with 5" wheels;Wheelchair (measurements PT);Wheelchair cushion (measurements PT)    Recommendations for Other Services       Precautions / Restrictions Precautions Precautions: Fall    Mobility  Bed Mobility               General bed mobility comments: Pt up on BSC  Transfers Overall transfer level: Needs assistance Equipment used: Ambulation equipment used;Rolling walker (2 wheeled) Transfers: Sit to/from Stand Sit to Stand: Min assist;Mod assist         General transfer comment: Assist to bring hips up and for balance. Stood from [employer] with Stedy with min assist and took pt to recliner with Stedy. Then stood from chair with rolling walker with mod assist. Verbal cues for hand placement.  Ambulation/Gait Ambulation/Gait assistance: Min assist Gait Distance (Feet): 18 Feet Assistive device: Rolling walker (2 wheeled) Gait Pattern/deviations: Step-through pattern;Decreased step length - right;Decreased step length - left;Shuffle;Wide base of support;Trunk flexed Gait velocity: decr Gait velocity interpretation: <1.8 ft/sec, indicate of risk for recurrent falls General Gait  Details: Assist for balance and support. As distance increases pt with incr bil knee flexion.   Stairs             Wheelchair Mobility    Modified Rankin (Stroke Patients Only)       Balance Overall balance assessment: Needs assistance Sitting-balance support: No upper extremity supported;Feet supported Sitting balance-Leahy Scale: Fair     Standing balance support: Bilateral upper extremity supported Standing balance-Leahy Scale: Poor Standing balance comment: walker and min assist for static standing. Stood multiple times for [employer] Arousal/Alertness: Awake/alert Behavior During Therapy: Flat affect Overall Cognitive Status: Impaired/Different from baseline Area of Impairment: Memory;Following commands;Problem solving                     Memory: Decreased short-term memory Following Commands: Follows one step commands with increased time;Follows one step commands consistently     Problem Solving: Slow processing;Requires verbal cues        Exercises      General Comments General comments (skin integrity, edema, etc.): HR to 130 transiently      Pertinent Vitals/Pain Pain Assessment: Faces Faces Pain Scale: Hurts a little bit Pain Location: neck Pain Descriptors / Indicators: Sore Pain Intervention(s): Limited activity within patient's tolerance    Home Living  Prior Function            PT Goals (current goals can now be found in the care plan section) Acute Rehab PT Goals Patient Stated Goal: go home PT Goal Formulation: With patient Time For Goal Achievement: 03/20/20 Potential to Achieve Goals: Fair Progress towards PT goals: Progressing toward goals;Goals met and updated - see care plan    Frequency    Min 3X/week      PT Plan Current plan remains appropriate;Frequency needs to be updated    Co-evaluation              AM-PAC PT "6  Clicks" Mobility   Outcome Measure  Help needed turning from your back to your side while in a flat bed without using bedrails?: A Lot Help needed moving from lying on your back to sitting on the side of a flat bed without using bedrails?: A Lot Help needed moving to and from a bed to a chair (including a wheelchair)?: A Little Help needed standing up from a chair using your arms (e.g., wheelchair or bedside chair)?: A Little Help needed to walk in hospital room?: A Little Help needed climbing 3-5 steps with a railing? : Total 6 Click Score: 14    End of Session Equipment Utilized During Treatment: Gait belt Activity Tolerance: Patient tolerated treatment well Patient left: with call bell/phone within reach;Other (comment) (on bsc) Nurse Communication: Mobility status (BM and stool incontinence.) PT Visit Diagnosis: Other abnormalities of gait and mobility (R26.89);History of falling (Z91.81);Muscle weakness (generalized) (M62.81)     Time: 6859-9234 PT Time Calculation (min) (ACUTE ONLY): 25 min  Charges:  $Gait Training: 23-37 mins                     [facility] Pager [phone] Office [place] 03/06/2020, 3:10 PM

## 2020-03-07 LAB — GLUCOSE, CAPILLARY
Glucose-Capillary: 173 mg/dL — ABNORMAL HIGH (ref 70–99)
Glucose-Capillary: 144 mg/dL — ABNORMAL HIGH (ref 70–99)
Glucose-Capillary: 83 mg/dL (ref 70–99)

## 2020-03-07 MED ORDER — OXYCODONE HCL 5 MG PO TABS
5.0000 mg | ORAL_TABLET | Freq: Four times a day (QID) | ORAL | Status: DC | PRN
  Administered 2020-03-08: 5 mg via ORAL
  Filled 2020-03-07: qty 1

## 2020-03-07 MED ORDER — ACETAMINOPHEN 500 MG PO TABS
1000.0000 mg | ORAL_TABLET | Freq: Three times a day (TID) | ORAL | Status: DC
  Administered 2020-03-07 – 2020-03-08 (×4): 1000 mg via ORAL
  Filled 2020-03-07 (×4): qty 2

## 2020-03-07 NOTE — TOC Progression Note (Signed)
Transition of Care [facility]) - Progression Note    Patient Details  Name: Jason [name] MRN: [id number] Date of Birth: July 09, 1950  Transition of Care [facility]) CM/SW Contact  Terrial Rhodes, LCSWA Phone Number: 03/07/2020, 1:25 PM  Clinical Narrative:      CSW spoke with patients insurance who let CSW that we will probably hear from determination of appeal tomorrow. Case ID# is [id number].  Pending appeal. Patient has SN[facility] bed at [facility]  CSW will continue to follow.   Expected Discharge Plan: Skilled Nursing Facility Barriers to Discharge: Continued Medical Work up  Expected Discharge Plan and Services Expected Discharge Plan: Skilled Nursing Facility       Living arrangements for the past 2 months: Single Family Home                                       Social Determinants of Health (SDOH) Interventions    Readmission Risk Interventions No flowsheet data found.

## 2020-03-07 NOTE — Progress Notes (Signed)
Occupational Therapy Treatment Patient Details Name: Jason [name] MRN: [id number] DOB: [birth year] Today's Date: 03/07/2020    History of present illness Pt adm after found down at home. Pt on the floor for extended time. Pt with DKA. Pt with lt rib fx's 8 and 9 and rt rib fx's 11-12. PMH - DM, HTN, CVA, CABG, afib with rvr, anxiety, depression.   OT comments  Pt motivated to work with therapy today. He continues to require significant assist for ADL tasks and would be unsafe to go return home at this time. He is min A for ambulation in room with RW, he is MAX A for LB ADL at this time, mod A for any ADL where he has to reach his back. He shared with me today that he has no hot water at home, and that his family - because of work obligations is not around to assist him. He is motivated to want to get stronger, move better, and be independent and safe prior to returning at home. SNF is essential for safety, to maximize independence in ADL and IADL and decrease the risk of readmission.    Follow Up Recommendations  Supervision/Assistance - 24 hour;SNF    Equipment Recommendations  3 in 1 bedside commode;Other (comment) (defer to next venue of care)    Recommendations for Other Services      Precautions / Restrictions Precautions Precautions: Fall Restrictions Weight Bearing Restrictions: No       Mobility Bed Mobility Overal bed mobility: Needs Assistance Bed Mobility: Supine to Sit Rolling: Min guard         General bed mobility comments: increased time, but no assist needed, use of bed rails  Transfers Overall transfer level: Needs assistance Equipment used: Rolling walker (2 wheeled) Transfers: Sit to/from Stand Sit to Stand: Min assist;Mod assist (min from elevated bed, mod from lower toilet)         General transfer comment: vc for safe hand placement - attempts to pull up on RW    Balance Overall balance assessment: Needs assistance Sitting-balance support: No  upper extremity supported;Feet supported Sitting balance-Leahy Scale: Fair     Standing balance support: Bilateral upper extremity supported Standing balance-Leahy Scale: Poor Standing balance comment: walker and min assist for static standing.                           ADL either performed or assessed with clinical judgement   ADL Overall ADL's : Needs assistance/impaired Eating/Feeding: Set up;Supervision/ safety;Sitting;Bed level Eating/Feeding Details (indicate cue type and reason): finishing up breakfast when OT entered Grooming: Wash/dry hands;Wash/dry face;Oral care;Set up;Sitting Grooming Details (indicate cue type and reason): on commode in bathroom Upper Body Bathing: Moderate assistance;Sitting Upper Body Bathing Details (indicate cue type and reason): assist for back, Pt able to complete front Lower Body Bathing: Maximal assistance Lower Body Bathing Details (indicate cue type and reason): assist for knees down for bathing Upper Body Dressing : Minimal assistance;Sitting Upper Body Dressing Details (indicate cue type and reason): to don new gown Lower Body Dressing: Maximal assistance;Sit to/from stand Lower Body Dressing Details (indicate cue type and reason): for socks Toilet Transfer: Minimal assistance;Ambulation;RW Toilet Transfer Details (indicate cue type and reason): requires assist to manage RW Toileting- Clothing Manipulation and Hygiene: Maximal assistance;Sit to/from stand Toileting - Clothing Manipulation Details (indicate cue type and reason): able to stand for rear peri care     Functional mobility during ADLs: Minimal assistance;Rolling walker;Cueing for  safety;Cueing for sequencing General ADL Comments: PT motivated to participate, wash up, oral care etc     Vision       Perception     Praxis      Cognition Arousal/Alertness: Awake/alert Behavior During Therapy: Flat affect Overall Cognitive Status: Impaired/Different from  baseline Area of Impairment: Memory;Following commands;Problem solving                     Memory: Decreased short-term memory Following Commands: Follows one step commands with increased time;Follows one step commands consistently     Problem Solving: Slow processing;Requires verbal cues          Exercises     Shoulder Instructions       General Comments      Pertinent Vitals/ Pain       Pain Assessment: No/denies pain Pain Intervention(s): Monitored during session  Home Living                                          Prior Functioning/Environment              Frequency  Min 2X/week        Progress Toward Goals  OT Goals(current goals can now be found in the care plan section)  Progress towards OT goals: Progressing toward goals  Acute Rehab OT Goals Patient Stated Goal: go home OT Goal Formulation: With patient Time For Goal Achievement: 03/14/20 Potential to Achieve Goals: Good  Plan Discharge plan remains appropriate;Frequency remains appropriate    Co-evaluation                 AM-PAC OT "6 Clicks" Daily Activity     Outcome Measure   Help from another person eating meals?: None Help from another person taking care of personal grooming?: A Little Help from another person toileting, which includes using toliet, bedpan, or urinal?: A Lot Help from another person bathing (including washing, rinsing, drying)?: A Lot Help from another person to put on and taking off regular upper body clothing?: A Lot Help from another person to put on and taking off regular lower body clothing?: A Lot 6 Click Score: 15    End of Session Equipment Utilized During Treatment: Gait belt;Rolling walker  OT Visit Diagnosis: Other abnormalities of gait and mobility (R26.89);Muscle weakness (generalized) (M62.81);History of falling (Z91.81);Other symptoms and signs involving cognitive function   Activity Tolerance Patient tolerated  treatment well   Patient Left in chair;with call bell/phone within reach;with chair alarm set   Nurse Communication Mobility status;Other (comment) (BM during session)        Time: 0913-1010 OT Time Calculation (min): 57 min  Charges: OT General Charges $OT Visit: 1 Visit OT Treatments $Self Care/Home Management : 23-37 mins $Therapeutic Activity: 23-37 mins  Nyoka Cowden OTR/L Acute Rehabilitation Services Pager: [phone] Office: [phone]    Evern Bio Ebunoluwa Gernert 03/07/2020, 10:19 AM

## 2020-03-07 NOTE — Progress Notes (Signed)
Subjective:   Hospital day: 10  Overnight event: NAEOV  This AM, says he's doing okay this morning, was able to get some sleep. States he has not been able to eat breakfast yet. Reports rib pain seems to be improved. Denies any leg pain, CP or SOB.   Discontinued IV dilaudid, started oxycodone 5mg  q6 PRN for breakthrough pain  Objective:  Vital signs in last 24 hours: Vitals:   03/06/20 1100 03/06/20 1707 03/06/20 2120 03/07/20 0314  BP: 96/61 (!) 146/69 128/64 (!) 184/78  Pulse:  70 63 68  Resp:  18 18 18   Temp:  97.9 F (36.6 C) 98.2 F (36.8 C) (!) 97.5 F (36.4 C)  TempSrc:  Oral Oral Oral  SpO2:  97% 97% 98%  Weight:      Height:        Physical Exam  General: Pleasant, elderly man in bed. Just woke up. No acute distress CV: regular rate. Irregular rhythm. No m/r/g. No LE edema Pulm: Lungs CTAB. No wheezing or rales.  Abd: Soft, nontender, non-distended. Normal BS Extremities: Faint pulses. Amputated R hallux. Warm to the touch. Non-tender heels. Skin: Warm and dry Neuro: A&Ox3. Moves all extremities. 4/5 grip strength.  Psych: Normal mood and affect.   Assessment/Plan: Jason [name] is a 69 y.o. male with PMH of T2DM, HTN anxiety/depression, previous CVA, CAD s/p CABG  who presented to the [facility] after a fall and was admitted for hyperglycemia secondary to medication nonadherence. Patient was treated for mixed picture of DKA/HHS. Blood sugar now well-controlled on current regimen. Afib well-controlled with beta-blocker. SNF placement denied. Expedited appeal in process.   Principal Problem:   Hyperosmolar hyperglycemic state (HHS) (HCC) Active Problems:   DKA, type 2 (HCC)   Paroxysmal A-fib (HCC)   Elevated troponin   CAD (coronary artery disease), native coronary artery   Pressure injury of skin  Fall Deconditioned Patient with a history of falls presented after falling and laying down for an unknown duration. Patient reports hitting head but CT head  negative for any acute intracranial abnormalities. Patient continues to be at risk of falls. Left rib pain improving. Continues to require SNF to maximize strength, balance and independence in ADLs and IADLs --Pending appeal determination --Continue PT/OT --Stop IV dilaudid --Start Oxy 5 mg q6h prn for breakthrough pain.  --Continue lidocaine patch and tylenol.  --Fall precautions  Anxiety/depression  Mood is gradually improving after starting Bupropion. Will continue to monitor.  --Continue Buproprion 75 mg daily  DKA/HHS, resolved CBG ranges from 140s-170s the last 2 days. Will continue to watch and adjust insulin regimen as needed. --Continue lantus 35u daily --Continue Novolog 7u TID w/ meals --SSI, moderate --CBG monitoring  A. Fib HR still in the 60s-70s. Off tele. --Continue metoprolol 50 mg BID and Eliquis 5 mg BID  HTN BP elevated to systolic in the 180s earlier this AM but improved to 110/61 this afternoon. Will continue to watch.  --Continue metoprolol 50 mg BID --Continue benazepril 40 mg daily --Daily vitals  CAD Hyperlipidemia Elevated troponins, resolved  Chronic and stable.  --Continue Crestor 20 mg daily  Discharge disposition Per CSW, we will likely hear about determination of appeal to SNF tomorrow. --Pending appeal decision.  Diet: Dysphagia 3 IVF: None VTE: Eliquis CODE: Full code  Prior to Admission Living Arrangement: Home Anticipated Discharge Location: SNF placement Barriers to Discharge: SNF appeal Dispo: Anticipated discharge in approximately 1-2 day(s).   Signed: 65, MD 03/07/2020, 5:52 AM  Pager:  [phone] Internal Medicine Teaching Service After 5pm on weekdays and 1pm on weekends: On Call pager: [phone]

## 2020-03-07 NOTE — Plan of Care (Signed)
  Problem: Activity: Goal: Risk for activity intolerance will decrease Outcome: Progressing   Problem: Nutrition: Goal: Adequate nutrition will be maintained Outcome: Progressing   

## 2020-03-07 NOTE — Progress Notes (Signed)
Physical Therapy Treatment Patient Details Name: Jason [name] MRN: [id number] DOB: 02/12/1951 Today's Date: 03/07/2020    History of Present Illness Pt adm after found down at home. Pt on the floor for extended time. Pt with DKA. Pt with lt rib fx's 8 and 9 and rt rib fx's 11-12. PMH - DM, HTN, CVA, CABG, afib with rvr, anxiety, depression.    PT Comments    Pt continues to make steady progress. Continue to recommend SNF for further rehab. Pt requires assist for all mobility and doesn't have that assistance at home.    Follow Up Recommendations  SNF     Equipment Recommendations  Rolling walker with 5" wheels;Wheelchair (measurements PT);Wheelchair cushion (measurements PT)    Recommendations for Other Services       Precautions / Restrictions Precautions Precautions: Fall    Mobility  Bed Mobility Overal bed mobility: Needs Assistance Bed Mobility: Supine to Sit;Sit to Supine     Supine to sit: Mod assist Sit to supine: Min assist   General bed mobility comments: Assist to elevate trunk into sitting. Assist to bring legs back up into bed returning to supine.  Transfers Overall transfer level: Needs assistance Equipment used: Rolling walker (2 wheeled) Transfers: Sit to/from Stand Sit to Stand: Min assist;From elevated surface         General transfer comment: assist to bring hips up and for balance. Pt with posterior bias. Verbal cues for hand placement  Ambulation/Gait Ambulation/Gait assistance: Min assist Gait Distance (Feet): 25 Feet Assistive device: Rolling walker (2 wheeled) Gait Pattern/deviations: Step-through pattern;Decreased step length - right;Decreased step length - left;Shuffle;Wide base of support;Trunk flexed Gait velocity: decr Gait velocity interpretation: <1.8 ft/sec, indicate of risk for recurrent falls General Gait Details: Assist for balance and support. Verbal cues to stand more erect.   Stairs             Wheelchair Mobility     Modified Rankin (Stroke Patients Only)       Balance Overall balance assessment: Needs assistance Sitting-balance support: No upper extremity supported;Feet supported Sitting balance-Leahy Scale: Fair     Standing balance support: Bilateral upper extremity supported Standing balance-Leahy Scale: Poor Standing balance comment: walker and min assist for static standing.                            Cognition Arousal/Alertness: Awake/alert Behavior During Therapy: Flat affect Overall Cognitive Status: Impaired/Different from baseline Area of Impairment: Memory;Following commands;Problem solving                     Memory: Decreased short-term memory Following Commands: Follows one step commands with increased time;Follows one step commands consistently     Problem Solving: Slow processing;Requires verbal cues        Exercises      General Comments        Pertinent Vitals/Pain Pain Assessment: Faces Faces Pain Scale: Hurts a little bit Pain Location: ribs Pain Descriptors / Indicators: Sore    Home Living                      Prior Function            PT Goals (current goals can now be found in the care plan section) Acute Rehab PT Goals Patient Stated Goal: go home Progress towards PT goals: Progressing toward goals    Frequency    Min 3X/week  PT Plan Current plan remains appropriate    Co-evaluation              AM-PAC PT "6 Clicks" Mobility   Outcome Measure  Help needed turning from your back to your side while in a flat bed without using bedrails?: A Lot Help needed moving from lying on your back to sitting on the side of a flat bed without using bedrails?: A Lot Help needed moving to and from a bed to a chair (including a wheelchair)?: A Little Help needed standing up from a chair using your arms (e.g., wheelchair or bedside chair)?: A Little Help needed to walk in hospital room?: A Little Help needed  climbing 3-5 steps with a railing? : Total 6 Click Score: 14    End of Session Equipment Utilized During Treatment: Gait belt Activity Tolerance: Patient tolerated treatment well Patient left: in bed;with call bell/phone within reach;with bed alarm set Nurse Communication: Mobility status PT Visit Diagnosis: Other abnormalities of gait and mobility (R26.89);History of falling (Z91.81);Muscle weakness (generalized) (M62.81)     Time: 9323-5573 PT Time Calculation (min) (ACUTE ONLY): 24 min  Charges:  $Gait Training: 23-37 mins                     [facility] [facility] PT Acute Rehabilitation Services Pager [phone] Office [phone]    Angelina Ok [facility] 03/07/2020, 4:23 PM

## 2020-03-08 LAB — GLUCOSE, CAPILLARY
Glucose-Capillary: 88 mg/dL (ref 70–99)
Glucose-Capillary: 178 mg/dL — ABNORMAL HIGH (ref 70–99)
Glucose-Capillary: 61 mg/dL — ABNORMAL LOW (ref 70–99)

## 2020-03-08 LAB — SARS CORONAVIRUS 2 BY RT PCR (HOSPITAL ORDER, PERFORMED IN ~~LOC~~ HOSPITAL LAB): SARS Coronavirus 2: NEGATIVE

## 2020-03-08 MED ORDER — BUPROPION HCL 75 MG PO TABS: 75.0000 mg | ORAL_TABLET | Freq: Every day | ORAL | 1 refills | Status: DC

## 2020-03-08 MED ORDER — MELATONIN 3 MG PO TABS: 3.0000 mg | ORAL_TABLET | Freq: Every evening | ORAL | 0 refills | Status: AC | PRN

## 2020-03-08 MED ORDER — METOPROLOL TARTRATE 50 MG PO TABS: 50.0000 mg | ORAL_TABLET | Freq: Two times a day (BID) | ORAL | 0 refills | Status: DC

## 2020-03-08 MED ORDER — ROSUVASTATIN CALCIUM 20 MG PO TABS: 20.0000 mg | ORAL_TABLET | Freq: Every day | ORAL | 0 refills | Status: DC

## 2020-03-08 MED ORDER — INSULIN LISPRO 100 UNIT/ML ~~LOC~~ SOLN: 2.0000 [IU] | Freq: Three times a day (TID) | SUBCUTANEOUS | 11 refills | Status: DC

## 2020-03-08 MED ORDER — ELIQUIS 5 MG PO TABS: 5.0000 mg | ORAL_TABLET | Freq: Two times a day (BID) | ORAL | 1 refills | Status: DC

## 2020-03-08 MED ORDER — LIDOCAINE 5 % EX PTCH: 2.0000 | MEDICATED_PATCH | CUTANEOUS | 1 refills | Status: DC

## 2020-03-08 MED ORDER — BENAZEPRIL HCL 40 MG PO TABS: 40.0000 mg | ORAL_TABLET | Freq: Every day | ORAL | 1 refills | Status: DC

## 2020-03-08 MED ORDER — ASPIRIN EC 81 MG PO TBEC: 81.0000 mg | DELAYED_RELEASE_TABLET | Freq: Every day | ORAL | 11 refills | Status: DC

## 2020-03-08 MED ORDER — BASAGLAR KWIKPEN 100 UNIT/ML ~~LOC~~ SOPN: 35.0000 [IU] | PEN_INJECTOR | Freq: Every day | SUBCUTANEOUS | 1 refills | Status: DC

## 2020-03-08 MED ORDER — OXYCODONE HCL 5 MG PO TABS: 5.0000 mg | ORAL_TABLET | Freq: Four times a day (QID) | ORAL | 0 refills | Status: AC | PRN

## 2020-03-08 NOTE — Progress Notes (Signed)
Report given to Saunders Glance at [facility] AVS printed and sent with patients packet which has prescription for percocet.

## 2020-03-08 NOTE — TOC Transition Note (Signed)
Transition of Care [facility]) - CM/SW Discharge Note   Patient Details  Name: Jason [name] MRN: [id number] Date of Birth: 05-06-1950  Transition of Care [facility]) CM/SW Contact:  Terrial Rhodes, LCSWA Phone Number: 03/08/2020, 4:37 PM   Clinical Narrative:     Patient will DC to: [place]  Anticipated DC date: 03/08/2020  Family notified: [name]  Transport by: Sharin Mons ?  Per MD patient ready for DC to [facility] . RN, patient, patient's family, and facility notified of DC. Discharge Summary sent to facility. RN given number for report tele# [phone] RM#[phone] ask for 1st floor nurse. DC packet on chart. Ambulance transport requested for patient.  CSW signing off.    Final next level of care: Skilled Nursing Facility Barriers to Discharge: No Barriers Identified   Patient Goals and CMS Choice Patient states their goals for this hospitalization and ongoing recovery are:: to go to SNF CMS Medicare.gov Compare Post Acute Care list provided to:: Patient (and son [name]) Choice offered to / list presented to : Patient,Adult Children (son [name])  Discharge Placement              Patient chooses bed at:  [facility]) Patient to be transferred to facility by: [facility] Name of family member notified: [name] Patient and family notified of of transfer: 03/08/20  Discharge Plan and Services                                     Social Determinants of Health (SDOH) Interventions     Readmission Risk Interventions No flowsheet data found.

## 2020-03-08 NOTE — TOC Progression Note (Signed)
Transition of Care [facility]) - Progression Note    Patient Details  Name: Wendel Homeyer MRN: [id number] Date of Birth: [birth year]  Transition of Care [facility]) CM/SW Contact  Terrial Rhodes, LCSWA Phone Number: 03/08/2020, 1:56 PM  Clinical Narrative:     CSW received call from patients insurance and appeal has been approved. Approval number is #[id number] valid through December 21st-25th. CSW informed Randa Lynn with [facility].  Patient has SNF bed at [facility]. Insurance authorization has been approved.  CSW will continue to follow.    Expected Discharge Plan: Skilled Nursing Facility Barriers to Discharge: Continued Medical Work up  Expected Discharge Plan and Services Expected Discharge Plan: Skilled Nursing Facility       Living arrangements for the past 2 months: Single Family Home                                       Social Determinants of Health (SDOH) Interventions    Readmission Risk Interventions No flowsheet data found.

## 2020-03-08 NOTE — Progress Notes (Signed)
   Subjective:   Hospital day: 11  Overnight event: NAEOV  This AM, states he is feeling okay. Reports pain has improved today given recent pain medications. Small amount of pain with deep breaths. He mentions he tolerated breakfast well. Denies chest pain, abdominal pain, leg pain. He was informed that we hope to hear from his insurance about the appeal today.   Objective:  Vital signs in last 24 hours: Vitals:   03/07/20 2034 03/07/20 2215 03/07/20 2219 03/08/20 0443  BP: (!) 103/56 120/64 120/64 120/69  Pulse: 65 77  61  Resp: 19   18  Temp: 98.3 F (36.8 C)   98.2 F (36.8 C)  TempSrc: Oral   Oral  SpO2: 96%   96%  Weight:      Height:        Physical Exam  General: Pleasant, elderly man laying in bed. More awake today. NAD CV: Regular rate. Irregular rhythm. No m/r/g. No LE edema Pulmonary: Lungs CTAB. No wheezing or rales. Abdominal: Soft, non-tender, non-distended. Normal BS. Extremities: Warm to the touch. Non-tender heels. Faint pulses. Amputated R hallux Skin: Warm and dry. No obvious rash or lesions.  Neuro: A&Ox3. Moves all extremities. Normal sensation. 4/5 grip strength.  Psych: More awake today. Normal mood and affect.   Assessment/Plan: Jason [name] is a 69 y.o. male with PMH of T2DM, HTN anxiety/depression, previous CVA, CAD s/p CABG  who presented to the [facility] after a fall and was admitted for hyperglycemia secondary to medication nonadherence. Patient was treated for mixed picture of DKA/HHS. Blood sugar now well-controlled on current regimen. Afib well-controlled with beta-blocker. SNF placement denied last week and denial appeal approved today.  Principal Problem:   Hyperosmolar hyperglycemic state (HHS) (HCC) Active Problems:   DKA, type 2 (HCC)   Paroxysmal A-fib (HCC)   Elevated troponin   CAD (coronary artery disease), native coronary artery  Fall Deconditioned Acute on chronic rib fractures Patient continues to have left ribs pain limiting  mobility. Making steady progress with PT but will require acute inpatient rehab to maximize strength and balance. Ribs pain improved with pain management but patient endorse occasional pain with deep breaths. Patient's appeal was approved for placement to [facility].  --Continue acute rehab at SNF --Continue pain management  Anxiety/depression  Mood gradually improving since starting Buproprion.  --Continue Buproprion 75 mg daily and titrate up as needed.   DKA/HHS, resolved Blood sugar currently better controlled. Blood cultures range from 83 to 178 over the last 2 days. He was managed with Lantus 25 units daily, NovoLog 7u TID w/ meals and SSI, moderate. Modify insulin regimen as needed for blood sugar control.   A. Fib Heart rate still stable in the 60s and 70s.Continue metoprolol 50 mg BID and Eliquis 5 mg BID  HTN BP improved to 120/69. Has had significant swings in BP so continue to watch closely. Continue metoprolol 50 mg twice daily and benazepril 40 mg daily  CAD Hyperlipidemia Elevated troponins, resolved  --Continue Crestor 20 mg daily.  Discharge disposition Plan for discharge today to [facility]  Diet: Dysphagia 3 IVF: None VTE: Eliquis CODE: Full code  Prior to Admission Living Arrangement: Home Anticipated Discharge Location: SNF placement [facility]) Barriers to Discharge: None Dispo: Anticipated discharge today.  Signed: Steffanie Rainwater, MD 03/08/2020, 5:59 AM  Pager: [phone] Internal Medicine Teaching Service After 5pm on weekdays and 1pm on weekends: On Call pager: [phone]

## 2020-05-11 ENCOUNTER — Emergency Department (HOSPITAL_COMMUNITY): Payer: Medicare HMO

## 2020-05-11 ENCOUNTER — Other Ambulatory Visit: Payer: Self-pay

## 2020-05-11 ENCOUNTER — Inpatient Hospital Stay (HOSPITAL_COMMUNITY)
Admission: EM | Admit: 2020-05-11 | Discharge: 2020-06-10 | DRG: 023 | Disposition: A | Discharge status: Hospice | Payer: Medicare HMO | Attending: Internal Medicine | Admitting: Internal Medicine

## 2020-05-11 ENCOUNTER — Encounter (HOSPITAL_COMMUNITY): Payer: Self-pay | Admitting: Emergency Medicine

## 2020-05-11 DIAGNOSIS — I824Z1 Acute embolism and thrombosis of unspecified deep veins of right distal lower extremity: Secondary | ICD-10-CM | POA: Diagnosis not present

## 2020-05-11 DIAGNOSIS — I618 Other nontraumatic intracerebral hemorrhage: Secondary | ICD-10-CM | POA: Diagnosis present

## 2020-05-11 DIAGNOSIS — T17908A Unspecified foreign body in respiratory tract, part unspecified causing other injury, initial encounter: Secondary | ICD-10-CM | POA: Diagnosis not present

## 2020-05-11 DIAGNOSIS — I82462 Acute embolism and thrombosis of left calf muscular vein: Secondary | ICD-10-CM | POA: Diagnosis present

## 2020-05-11 DIAGNOSIS — Z20822 Contact with and (suspected) exposure to covid-19: Secondary | ICD-10-CM | POA: Diagnosis present

## 2020-05-11 DIAGNOSIS — G919 Hydrocephalus, unspecified: Secondary | ICD-10-CM | POA: Diagnosis present

## 2020-05-11 DIAGNOSIS — E870 Hyperosmolality and hypernatremia: Secondary | ICD-10-CM | POA: Diagnosis present

## 2020-05-11 DIAGNOSIS — F419 Anxiety disorder, unspecified: Secondary | ICD-10-CM | POA: Diagnosis present

## 2020-05-11 DIAGNOSIS — J189 Pneumonia, unspecified organism: Secondary | ICD-10-CM | POA: Diagnosis not present

## 2020-05-11 DIAGNOSIS — I610 Nontraumatic intracerebral hemorrhage in hemisphere, subcortical: Secondary | ICD-10-CM | POA: Diagnosis not present

## 2020-05-11 DIAGNOSIS — Z781 Physical restraint status: Secondary | ICD-10-CM

## 2020-05-11 DIAGNOSIS — J690 Pneumonitis due to inhalation of food and vomit: Secondary | ICD-10-CM | POA: Diagnosis not present

## 2020-05-11 DIAGNOSIS — I82442 Acute embolism and thrombosis of left tibial vein: Secondary | ICD-10-CM | POA: Diagnosis present

## 2020-05-11 DIAGNOSIS — H3560 Retinal hemorrhage, unspecified eye: Secondary | ICD-10-CM | POA: Diagnosis present

## 2020-05-11 DIAGNOSIS — N5089 Other specified disorders of the male genital organs: Secondary | ICD-10-CM | POA: Diagnosis not present

## 2020-05-11 DIAGNOSIS — I82413 Acute embolism and thrombosis of femoral vein, bilateral: Secondary | ICD-10-CM | POA: Diagnosis present

## 2020-05-11 DIAGNOSIS — Z794 Long term (current) use of insulin: Secondary | ICD-10-CM

## 2020-05-11 DIAGNOSIS — I615 Nontraumatic intracerebral hemorrhage, intraventricular: Secondary | ICD-10-CM | POA: Diagnosis not present

## 2020-05-11 DIAGNOSIS — I959 Hypotension, unspecified: Secondary | ICD-10-CM | POA: Diagnosis not present

## 2020-05-11 DIAGNOSIS — E669 Obesity, unspecified: Secondary | ICD-10-CM | POA: Diagnosis present

## 2020-05-11 DIAGNOSIS — I4821 Permanent atrial fibrillation: Secondary | ICD-10-CM | POA: Diagnosis present

## 2020-05-11 DIAGNOSIS — R601 Generalized edema: Secondary | ICD-10-CM | POA: Diagnosis not present

## 2020-05-11 DIAGNOSIS — R29818 Other symptoms and signs involving the nervous system: Secondary | ICD-10-CM | POA: Diagnosis not present

## 2020-05-11 DIAGNOSIS — I824Z3 Acute embolism and thrombosis of unspecified deep veins of distal lower extremity, bilateral: Secondary | ICD-10-CM

## 2020-05-11 DIAGNOSIS — Z515 Encounter for palliative care: Secondary | ICD-10-CM | POA: Diagnosis not present

## 2020-05-11 DIAGNOSIS — I629 Nontraumatic intracranial hemorrhage, unspecified: Secondary | ICD-10-CM

## 2020-05-11 DIAGNOSIS — Z79899 Other long term (current) drug therapy: Secondary | ICD-10-CM

## 2020-05-11 DIAGNOSIS — R6520 Severe sepsis without septic shock: Secondary | ICD-10-CM | POA: Diagnosis not present

## 2020-05-11 DIAGNOSIS — Z789 Other specified health status: Secondary | ICD-10-CM | POA: Diagnosis not present

## 2020-05-11 DIAGNOSIS — R569 Unspecified convulsions: Secondary | ICD-10-CM | POA: Diagnosis not present

## 2020-05-11 DIAGNOSIS — I82432 Acute embolism and thrombosis of left popliteal vein: Secondary | ICD-10-CM | POA: Diagnosis present

## 2020-05-11 DIAGNOSIS — J989 Respiratory disorder, unspecified: Secondary | ICD-10-CM

## 2020-05-11 DIAGNOSIS — I6389 Other cerebral infarction: Secondary | ICD-10-CM | POA: Diagnosis not present

## 2020-05-11 DIAGNOSIS — E785 Hyperlipidemia, unspecified: Secondary | ICD-10-CM | POA: Diagnosis not present

## 2020-05-11 DIAGNOSIS — I5023 Acute on chronic systolic (congestive) heart failure: Secondary | ICD-10-CM | POA: Diagnosis not present

## 2020-05-11 DIAGNOSIS — Z8673 Personal history of transient ischemic attack (TIA), and cerebral infarction without residual deficits: Secondary | ICD-10-CM

## 2020-05-11 DIAGNOSIS — J9600 Acute respiratory failure, unspecified whether with hypoxia or hypercapnia: Secondary | ICD-10-CM

## 2020-05-11 DIAGNOSIS — E113299 Type 2 diabetes mellitus with mild nonproliferative diabetic retinopathy without macular edema, unspecified eye: Secondary | ICD-10-CM | POA: Diagnosis present

## 2020-05-11 DIAGNOSIS — I5022 Chronic systolic (congestive) heart failure: Secondary | ICD-10-CM | POA: Diagnosis present

## 2020-05-11 DIAGNOSIS — N179 Acute kidney failure, unspecified: Secondary | ICD-10-CM | POA: Diagnosis not present

## 2020-05-11 DIAGNOSIS — A419 Sepsis, unspecified organism: Secondary | ICD-10-CM | POA: Diagnosis not present

## 2020-05-11 DIAGNOSIS — E876 Hypokalemia: Secondary | ICD-10-CM | POA: Diagnosis present

## 2020-05-11 DIAGNOSIS — B3789 Other sites of candidiasis: Secondary | ICD-10-CM | POA: Diagnosis not present

## 2020-05-11 DIAGNOSIS — I480 Paroxysmal atrial fibrillation: Secondary | ICD-10-CM | POA: Diagnosis not present

## 2020-05-11 DIAGNOSIS — Z66 Do not resuscitate: Secondary | ICD-10-CM | POA: Diagnosis not present

## 2020-05-11 DIAGNOSIS — G9341 Metabolic encephalopathy: Secondary | ICD-10-CM

## 2020-05-11 DIAGNOSIS — Z0189 Encounter for other specified special examinations: Secondary | ICD-10-CM

## 2020-05-11 DIAGNOSIS — Z7901 Long term (current) use of anticoagulants: Secondary | ICD-10-CM | POA: Diagnosis not present

## 2020-05-11 DIAGNOSIS — E11649 Type 2 diabetes mellitus with hypoglycemia without coma: Secondary | ICD-10-CM | POA: Diagnosis not present

## 2020-05-11 DIAGNOSIS — D6959 Other secondary thrombocytopenia: Secondary | ICD-10-CM | POA: Diagnosis not present

## 2020-05-11 DIAGNOSIS — I161 Hypertensive emergency: Secondary | ICD-10-CM | POA: Diagnosis not present

## 2020-05-11 DIAGNOSIS — Z4659 Encounter for fitting and adjustment of other gastrointestinal appliance and device: Secondary | ICD-10-CM

## 2020-05-11 DIAGNOSIS — Z9114 Patient's other noncompliance with medication regimen: Secondary | ICD-10-CM

## 2020-05-11 DIAGNOSIS — Z978 Presence of other specified devices: Secondary | ICD-10-CM | POA: Diagnosis not present

## 2020-05-11 DIAGNOSIS — R296 Repeated falls: Secondary | ICD-10-CM | POA: Diagnosis present

## 2020-05-11 DIAGNOSIS — R4701 Aphasia: Secondary | ICD-10-CM | POA: Diagnosis present

## 2020-05-11 DIAGNOSIS — R0682 Tachypnea, not elsewhere classified: Secondary | ICD-10-CM

## 2020-05-11 DIAGNOSIS — Z01818 Encounter for other preprocedural examination: Secondary | ICD-10-CM | POA: Diagnosis not present

## 2020-05-11 DIAGNOSIS — I82422 Acute embolism and thrombosis of left iliac vein: Secondary | ICD-10-CM | POA: Diagnosis present

## 2020-05-11 DIAGNOSIS — R739 Hyperglycemia, unspecified: Secondary | ICD-10-CM | POA: Diagnosis not present

## 2020-05-11 DIAGNOSIS — I4819 Other persistent atrial fibrillation: Secondary | ICD-10-CM | POA: Diagnosis not present

## 2020-05-11 DIAGNOSIS — T17908D Unspecified foreign body in respiratory tract, part unspecified causing other injury, subsequent encounter: Secondary | ICD-10-CM | POA: Diagnosis not present

## 2020-05-11 DIAGNOSIS — E861 Hypovolemia: Secondary | ICD-10-CM | POA: Diagnosis present

## 2020-05-11 DIAGNOSIS — R4182 Altered mental status, unspecified: Secondary | ICD-10-CM | POA: Diagnosis present

## 2020-05-11 DIAGNOSIS — B377 Candidal sepsis: Secondary | ICD-10-CM | POA: Diagnosis not present

## 2020-05-11 DIAGNOSIS — E1165 Type 2 diabetes mellitus with hyperglycemia: Secondary | ICD-10-CM | POA: Diagnosis not present

## 2020-05-11 DIAGNOSIS — R109 Unspecified abdominal pain: Secondary | ICD-10-CM

## 2020-05-11 DIAGNOSIS — I611 Nontraumatic intracerebral hemorrhage in hemisphere, cortical: Secondary | ICD-10-CM | POA: Diagnosis not present

## 2020-05-11 DIAGNOSIS — Z7189 Other specified counseling: Secondary | ICD-10-CM | POA: Diagnosis not present

## 2020-05-11 DIAGNOSIS — J9602 Acute respiratory failure with hypercapnia: Secondary | ICD-10-CM | POA: Diagnosis not present

## 2020-05-11 DIAGNOSIS — H5500 Unspecified nystagmus: Secondary | ICD-10-CM | POA: Diagnosis present

## 2020-05-11 DIAGNOSIS — I612 Nontraumatic intracerebral hemorrhage in hemisphere, unspecified: Secondary | ICD-10-CM | POA: Diagnosis not present

## 2020-05-11 DIAGNOSIS — I2510 Atherosclerotic heart disease of native coronary artery without angina pectoris: Secondary | ICD-10-CM | POA: Diagnosis present

## 2020-05-11 DIAGNOSIS — I110 Hypertensive heart disease with heart failure: Secondary | ICD-10-CM | POA: Diagnosis present

## 2020-05-11 DIAGNOSIS — R400 Somnolence: Secondary | ICD-10-CM | POA: Diagnosis not present

## 2020-05-11 DIAGNOSIS — I38 Endocarditis, valve unspecified: Secondary | ICD-10-CM | POA: Diagnosis not present

## 2020-05-11 DIAGNOSIS — I252 Old myocardial infarction: Secondary | ICD-10-CM

## 2020-05-11 DIAGNOSIS — R0902 Hypoxemia: Secondary | ICD-10-CM

## 2020-05-11 DIAGNOSIS — O2230 Deep phlebothrombosis in pregnancy, unspecified trimester: Secondary | ICD-10-CM

## 2020-05-11 DIAGNOSIS — Z9911 Dependence on respirator [ventilator] status: Secondary | ICD-10-CM | POA: Diagnosis not present

## 2020-05-11 DIAGNOSIS — I619 Nontraumatic intracerebral hemorrhage, unspecified: Secondary | ICD-10-CM | POA: Diagnosis present

## 2020-05-11 DIAGNOSIS — L89153 Pressure ulcer of sacral region, stage 3: Secondary | ICD-10-CM | POA: Diagnosis not present

## 2020-05-11 DIAGNOSIS — I4892 Unspecified atrial flutter: Secondary | ICD-10-CM

## 2020-05-11 DIAGNOSIS — R509 Fever, unspecified: Secondary | ICD-10-CM | POA: Diagnosis not present

## 2020-05-11 DIAGNOSIS — Z6833 Body mass index (BMI) 33.0-33.9, adult: Secondary | ICD-10-CM

## 2020-05-11 DIAGNOSIS — J9601 Acute respiratory failure with hypoxia: Secondary | ICD-10-CM | POA: Diagnosis not present

## 2020-05-11 DIAGNOSIS — L89152 Pressure ulcer of sacral region, stage 2: Secondary | ICD-10-CM | POA: Diagnosis not present

## 2020-05-11 DIAGNOSIS — R1310 Dysphagia, unspecified: Secondary | ICD-10-CM | POA: Diagnosis present

## 2020-05-11 DIAGNOSIS — B49 Unspecified mycosis: Secondary | ICD-10-CM

## 2020-05-11 DIAGNOSIS — L8990 Pressure ulcer of unspecified site, unspecified stage: Secondary | ICD-10-CM | POA: Diagnosis present

## 2020-05-11 DIAGNOSIS — H518 Other specified disorders of binocular movement: Secondary | ICD-10-CM | POA: Diagnosis present

## 2020-05-11 DIAGNOSIS — B379 Candidiasis, unspecified: Secondary | ICD-10-CM | POA: Diagnosis not present

## 2020-05-11 DIAGNOSIS — I483 Typical atrial flutter: Secondary | ICD-10-CM | POA: Diagnosis not present

## 2020-05-11 DIAGNOSIS — L8992 Pressure ulcer of unspecified site, stage 2: Secondary | ICD-10-CM | POA: Diagnosis not present

## 2020-05-11 DIAGNOSIS — D649 Anemia, unspecified: Secondary | ICD-10-CM | POA: Diagnosis not present

## 2020-05-11 DIAGNOSIS — Z1632 Resistance to antifungal drug(s): Secondary | ICD-10-CM | POA: Diagnosis not present

## 2020-05-11 DIAGNOSIS — M7989 Other specified soft tissue disorders: Secondary | ICD-10-CM | POA: Diagnosis not present

## 2020-05-11 DIAGNOSIS — G9340 Encephalopathy, unspecified: Secondary | ICD-10-CM | POA: Diagnosis not present

## 2020-05-11 DIAGNOSIS — I4891 Unspecified atrial fibrillation: Secondary | ICD-10-CM | POA: Diagnosis not present

## 2020-05-11 DIAGNOSIS — F32A Depression, unspecified: Secondary | ICD-10-CM | POA: Diagnosis present

## 2020-05-11 DIAGNOSIS — Z951 Presence of aortocoronary bypass graft: Secondary | ICD-10-CM

## 2020-05-11 LAB — URINALYSIS, COMPLETE (UACMP) WITH MICROSCOPIC
Bilirubin Urine: NEGATIVE
Glucose, UA: 50 mg/dL — AB
Ketones, ur: 20 mg/dL — AB
Nitrite: NEGATIVE
Protein, ur: 100 mg/dL — AB
RBC / HPF: >50 RBC/hpf — ABNORMAL HIGH (ref 0–5)
Specific Gravity, Urine: 1.011 (ref 1.005–1.030)
pH: 7 (ref 5.0–8.0)

## 2020-05-11 LAB — COMPREHENSIVE METABOLIC PANEL
ALT: 30 U/L (ref 0–44)
AST: 29 U/L (ref 15–41)
Albumin: 4.4 g/dL (ref 3.5–5.0)
Alkaline Phosphatase: 144 U/L — ABNORMAL HIGH (ref 38–126)
Anion gap: 11 (ref 5–15)
BUN: 8 mg/dL (ref 8–23)
CO2: 28 mmol/L (ref 22–32)
Calcium: 9.3 mg/dL (ref 8.9–10.3)
Chloride: 100 mmol/L (ref 98–111)
Creatinine, Ser: 0.69 mg/dL (ref 0.61–1.24)
GFR, Estimated: >60 mL/min (ref >60)
Glucose, Bld: 166 mg/dL — ABNORMAL HIGH (ref 70–99)
Potassium: 3.4 mmol/L — ABNORMAL LOW (ref 3.5–5.1)
Sodium: 139 mmol/L (ref 135–145)
Total Bilirubin: 0.8 mg/dL (ref 0.3–1.2)
Total Protein: 8.1 g/dL (ref 6.5–8.1)

## 2020-05-11 LAB — RESP PANEL BY RT-PCR (FLU A&B, COVID) ARPGX2
Influenza A by PCR: NEGATIVE
Influenza B by PCR: NEGATIVE
SARS Coronavirus 2 by RT PCR: NEGATIVE

## 2020-05-11 LAB — PROTIME-INR
INR: 1.1 (ref 0.8–1.2)
Prothrombin Time: 13.5 seconds (ref 11.4–15.2)

## 2020-05-11 LAB — CBC WITH DIFFERENTIAL/PLATELET
Abs Immature Granulocytes: 0.03 10*3/uL (ref 0.00–0.07)
Basophils Absolute: 0.1 10*3/uL (ref 0.0–0.1)
Basophils Relative: 1 %
Eosinophils Absolute: 0 10*3/uL (ref 0.0–0.5)
Eosinophils Relative: 0 %
HCT: 52.1 % — ABNORMAL HIGH (ref 39.0–52.0)
Hemoglobin: 17.2 g/dL — ABNORMAL HIGH (ref 13.0–17.0)
Immature Granulocytes: 0 %
Lymphocytes Relative: 10 %
Lymphs Abs: 1 10*3/uL (ref 0.7–4.0)
MCH: 28.5 pg (ref 26.0–34.0)
MCHC: 33 g/dL (ref 30.0–36.0)
MCV: 86.3 fL (ref 80.0–100.0)
Monocytes Absolute: 0.4 10*3/uL (ref 0.1–1.0)
Monocytes Relative: 5 %
Neutro Abs: 7.8 10*3/uL — ABNORMAL HIGH (ref 1.7–7.7)
Neutrophils Relative %: 84 %
Platelets: 163 10*3/uL (ref 150–400)
RBC: 6.04 MIL/uL — ABNORMAL HIGH (ref 4.22–5.81)
RDW: 13.7 % (ref 11.5–15.5)
WBC: 9.2 10*3/uL (ref 4.0–10.5)
nRBC: 0 % (ref 0.0–0.2)

## 2020-05-11 LAB — AMMONIA: Ammonia: 13 umol/L (ref 9–35)

## 2020-05-11 LAB — LACTIC ACID, PLASMA: Lactic Acid, Venous: 1.6 mmol/L (ref 0.5–1.9)

## 2020-05-11 LAB — HEPARIN LEVEL (UNFRACTIONATED): Heparin Unfractionated: 1.1 IU/mL — ABNORMAL HIGH (ref 0.30–0.70)

## 2020-05-11 LAB — APTT: aPTT: 30 seconds (ref 24–36)

## 2020-05-11 LAB — CBG MONITORING, ED: Glucose-Capillary: 153 mg/dL — ABNORMAL HIGH (ref 70–99)

## 2020-05-11 MED ORDER — INSULIN ASPART 100 UNIT/ML ~~LOC~~ SOLN
0.0000 [IU] | SUBCUTANEOUS | Status: DC
  Administered 2020-05-12 (×3): 5 [IU] via SUBCUTANEOUS

## 2020-05-11 MED ORDER — CHLORHEXIDINE GLUCONATE CLOTH 2 % EX PADS
6.0000 | MEDICATED_PAD | Freq: Every day | CUTANEOUS | Status: DC
  Administered 2020-05-12 – 2020-05-26 (×16): 6 via TOPICAL

## 2020-05-11 MED ORDER — PROTHROMBIN COMPLEX CONC HUMAN 500 UNITS IV KIT
4880.0000 [IU] | PACK | Status: AC
  Administered 2020-05-11: 4880 [IU] via INTRAVENOUS
  Filled 2020-05-11: qty 4000

## 2020-05-11 MED ORDER — PANTOPRAZOLE SODIUM 40 MG IV SOLR
40.0000 mg | Freq: Every day | INTRAVENOUS | Status: DC
  Administered 2020-05-12 – 2020-05-14 (×4): 40 mg via INTRAVENOUS
  Filled 2020-05-11 (×4): qty 40

## 2020-05-11 MED ORDER — CLEVIDIPINE BUTYRATE 0.5 MG/ML IV EMUL
0.0000 mg/h | INTRAVENOUS | Status: DC
  Administered 2020-05-11: 1 mg/h via INTRAVENOUS
  Administered 2020-05-12: 5 mg/h via INTRAVENOUS
  Administered 2020-05-12: 21:00:00 1 mg/h via INTRAVENOUS
  Administered 2020-05-13: 16:00:00 2 mg/h via INTRAVENOUS
  Administered 2020-05-13: 1.5 mg/h via INTRAVENOUS
  Filled 2020-05-11 (×4): qty 50

## 2020-05-11 MED ORDER — ONDANSETRON HCL 4 MG/2ML IJ SOLN: 4.0000 mg | Freq: Once | INTRAMUSCULAR | Status: AC

## 2020-05-11 MED ORDER — SENNOSIDES-DOCUSATE SODIUM 8.6-50 MG PO TABS: 1.0000 | ORAL_TABLET | Freq: Two times a day (BID) | ORAL | Status: DC

## 2020-05-11 MED ORDER — STROKE: EARLY STAGES OF RECOVERY BOOK
Freq: Once | Status: AC
  Filled 2020-05-11: qty 1

## 2020-05-11 MED ORDER — ACETAMINOPHEN 160 MG/5ML PO SOLN
650.0000 mg | ORAL | Status: DC | PRN
  Administered 2020-05-14 – 2020-06-07 (×27): 650 mg
  Filled 2020-05-11 (×27): qty 20.3

## 2020-05-11 MED ORDER — SODIUM CHLORIDE 0.9 % IV SOLN: INTRAVENOUS | Status: DC

## 2020-05-11 MED ORDER — ACETAMINOPHEN 325 MG PO TABS: 650.0000 mg | ORAL_TABLET | ORAL | Status: DC | PRN

## 2020-05-11 MED ORDER — ACETAMINOPHEN 650 MG RE SUPP
650.0000 mg | RECTAL | Status: DC | PRN
  Administered 2020-05-17 – 2020-05-31 (×4): 650 mg via RECTAL
  Filled 2020-05-11 (×4): qty 1

## 2020-05-11 MED ORDER — LABETALOL HCL 5 MG/ML IV SOLN
20.0000 mg | Freq: Once | INTRAVENOUS | Status: AC
  Administered 2020-05-11: 20 mg via INTRAVENOUS
  Filled 2020-05-11: qty 4

## 2020-05-11 MED ORDER — ONDANSETRON HCL 4 MG/2ML IJ SOLN
INTRAMUSCULAR | Status: AC
  Administered 2020-05-11: 4 mg via INTRAVENOUS
  Filled 2020-05-11: qty 2

## 2020-05-11 MED ORDER — ONDANSETRON HCL 4 MG/2ML IJ SOLN: 4.0000 mg | Freq: Once | INTRAMUSCULAR | Status: DC | PRN

## 2020-05-11 NOTE — ED Triage Notes (Signed)
Arrives via EMS from [facility], C/C AMS since this morning, patient can normally ambulate unassisted and carry on a coherent conversation, staff reports he is 'talking out of his head and taking his clothes off.' EMS reports garbled, incomprehensible speech. Went to the urologist yesterday and was given an abx, unsure what for. Staff attempted to send patient out this morning, but pt refused.

## 2020-05-11 NOTE — ED Notes (Signed)
Report called to [place] in NICU and [place] with [facility].
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
## 2020-05-11 NOTE — ED Notes (Signed)
While this Clinical research associate and Jason [name], NT was getting patient cleaned up , this writer noticed that patient face began turning red and patient began to gag. Staff immediately sat head of bed up with patient. Once patient was sitting up patient had an episode of emesis x2. RN and EDP Plunkett notified
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
## 2020-05-11 NOTE — ED Provider Notes (Signed)
[facility]-EMERGENCY DEPT Provider Note   CSN: [id number] Arrival date & time: 05/11/20  1856     History Chief Complaint  Patient presents with  . Altered Mental Status    Jason [name] is a 70 y.o. male.  Patient is a 70 year old male with a history of CAD, atrial fibrillation on Eliquis, diabetes, hypertension who is presenting today from his nursing facility for altered mental status.  EMS reported that the facility noticed when he woke up today he was not himself.  They initially called paramedics who evaluated him this morning and they felt that he was not altered and did not need to come to the hospital.  However he continued to be altered throughout the day trying to take his clothes off unable to answer questions and not acting himself.  Normally the patient is able to carry on a conversation and ambulates without difficulty.  They called paramedics again due to his abnormal behavior.  Also he was noted to have extremely high blood pressure at the facility.  Upon arrival here patient cannot answer any questions reliably and does not follow commands reliably.  EMS did not give any medications in route.  It is unclear when he last took his medicines.  The history is provided by the nursing home and the EMS personnel. The history is limited by the condition of the patient.  Altered Mental Status Presenting symptoms: behavior changes        Past Medical History:  Diagnosis Date  . Anxiety   . Coronary artery disease   . Depression   . Diabetes mellitus without complication (HCC)   . Dysrhythmia   . Hypertension   . Myocardial infarction (HCC)   . Stroke [facility])     Patient Active Problem List   Diagnosis Date Noted  . ICH (intracerebral hemorrhage) (HCC) 05/11/2020  . Pressure injury of skin 03/02/2020  . DKA, type 2 (HCC) 02/27/2020  . Paroxysmal A-fib (HCC) 02/27/2020  . Elevated troponin 02/27/2020  . CAD (coronary artery disease), native coronary  artery 02/27/2020  . Hyperosmolar hyperglycemic state (HHS) (HCC) 02/26/2020  . Essential hypertension 10/29/2019  . Type 2 diabetes mellitus (HCC) 10/29/2019    Past Surgical History:  Procedure Laterality Date  . CORONARY ARTERY BYPASS GRAFT         No family history on file.  Social History   Tobacco Use  . Smoking status: Never Smoker  . Smokeless tobacco: Never Used  Vaping Use  . Vaping Use: Never used  Substance Use Topics  . Alcohol use: Not Currently  . Drug use: Not Currently    Home Medications Prior to Admission medications   Medication Sig Start Date End Date Taking? Authorizing Provider  acetaminophen (TYLENOL) 650 MG CR tablet Take 650 mg by mouth every 8 (eight) hours as needed for pain.   Yes [provider]  benazepril (LOTENSIN) 40 MG tablet Take 1 tablet (40 mg total) by mouth daily. 03/08/20 05/07/20 Yes Steffanie Rainwater, MD  buPROPion (WELLBUTRIN) 75 MG tablet Take 1 tablet (75 mg total) by mouth daily. 03/09/20 05/08/20 Yes Amponsah, Flossie Buffy, MD  docusate sodium (COLACE) 100 MG capsule Take 100 mg by mouth daily.   Yes [provider]  doxycycline (VIBRAMYCIN) 100 MG capsule Take 100 mg by mouth 2 (two) times daily. Start date: 05/09/20   Yes [provider]  ELIQUIS 5 MG TABS tablet Take 1 tablet (5 mg total) by mouth 2 (two) times daily. 03/08/20  05/07/20 Yes Steffanie Rainwater, MD  Insulin Glargine (BASAGLAR KWIKPEN) 100 UNIT/ML Inject 35 Units into the skin daily. ON Hold per [facility] Patient taking differently: Inject 20 Units into the skin at bedtime. 03/08/20  Yes Amponsah, Flossie Buffy, MD  insulin lispro (HUMALOG) 100 UNIT/ML KwikPen Inject 2-12 Units into the skin 3 (three) times daily. Sliding scale:   0-150 = 2 units 151-200 = 4 units 201-250 = 6 units 251-300 = 8 units 301-350 = 10 units 351-400 = 12 units   Yes [provider]  melatonin 3 MG TABS tablet Take 3 mg by mouth at bedtime.   Yes [provider]  metoprolol tartrate (LOPRESSOR) 50 MG tablet Take 1 tablet (50 mg total) by mouth 2 (two) times daily. 03/08/20 04/07/20 Yes Steffanie Rainwater, MD  ondansetron (ZOFRAN) 4 MG tablet Take 4 mg by mouth every 8 (eight) hours as needed for nausea or vomiting.   Yes [provider]  polyethylene glycol (MIRALAX / GLYCOLAX) 17 g packet Take 17 g by mouth daily.   Yes [provider]  rosuvastatin (CRESTOR) 20 MG tablet Take 1 tablet (20 mg total) by mouth daily. 03/09/20 04/08/20 Yes Steffanie Rainwater, MD  aspirin EC 81 MG tablet Take 1 tablet (81 mg total) by mouth daily. Patient not taking: Reported on 05/11/2020 03/08/20   Steffanie Rainwater, MD  insulin lispro (HUMALOG) 100 UNIT/ML injection Inject 0.02-0.12 mLs (2-12 Units total) into the skin 3 (three) times daily with meals. Based on Sliding scale Patient not taking: Reported on 05/11/2020 03/08/20   Steffanie Rainwater, MD  lidocaine (LIDODERM) 5 % Place 2 patches onto the skin daily. Remove & Discard patch within 12 hours or as directed by MD Patient not taking: Reported on 05/11/2020 03/09/20   Steffanie Rainwater, MD    Allergies    Patient has no known allergies.  Review of Systems   Review of Systems  Unable to perform ROS: Mental status change    Physical Exam Updated Vital Signs BP 129/89   Pulse (!) 112   Temp (!) 100.6 F (38.1 C) (Rectal)   Resp 18   Ht 5\' 10"  (1.778 m)   Wt 100 kg   SpO2 96%   BMI 31.63 kg/m   Physical Exam Vitals and nursing note reviewed.  Constitutional:      General: He is not in acute distress.    Appearance: He is well-developed and well-nourished.     Comments: Patient is awake and will mumble occasionally but does not answer questions  HENT:     Head: Normocephalic and atraumatic.     Mouth/Throat:     Mouth: Oropharynx is clear and moist. Mucous membranes are dry.  Eyes:     Extraocular Movements: EOM normal.     Conjunctiva/sclera: Conjunctivae  normal.     Pupils: Pupils are equal, round, and reactive to light.  Cardiovascular:     Rate and Rhythm: Tachycardia present. Rhythm irregularly irregular.     Pulses: Intact distal pulses.     Heart sounds: No murmur heard.   Pulmonary:     Effort: Pulmonary effort is normal. No respiratory distress.     Breath sounds: Normal breath sounds. No wheezing or rales.  Abdominal:     General: There is no distension.     Palpations: Abdomen is soft.     Tenderness: There is no abdominal tenderness. There is no guarding or rebound.  Musculoskeletal:  General: No tenderness or edema. Normal range of motion.     Cervical back: Normal range of motion and neck supple.     Right lower leg: No edema.     Left lower leg: No edema.  Skin:    General: Skin is warm and dry.     Findings: No erythema or rash.  Neurological:     Comments: Patient will open his eyes and look at you when you say his name.  He is moving all extremities but does not follow commands reliably.  He has no notable facial droop or sensation deficit.  There is no neglect.  Psychiatric:        Mood and Affect: Mood and affect normal.     Comments: Mildly agitated      ED Results / Procedures / Treatments   Labs (all labs ordered are listed, but only abnormal results are displayed) Labs Reviewed  COMPREHENSIVE METABOLIC PANEL - Abnormal; Notable for the following components:      Result Value   Potassium 3.4 (*)    Glucose, Bld 166 (*)    Alkaline Phosphatase 144 (*)    All other components within normal limits  CBC WITH DIFFERENTIAL/PLATELET - Abnormal; Notable for the following components:   RBC 6.04 (*)    Hemoglobin 17.2 (*)    HCT 52.1 (*)    Neutro Abs 7.8 (*)    All other components within normal limits  HEPARIN LEVEL (UNFRACTIONATED) - Abnormal; Notable for the following components:   Heparin Unfractionated 1.10 (*)    All other components within normal limits  CBG MONITORING, ED - Abnormal;  Notable for the following components:   Glucose-Capillary 153 (*)    All other components within normal limits  URINE CULTURE  CULTURE, BLOOD (ROUTINE X 2)  CULTURE, BLOOD (ROUTINE X 2)  RESP PANEL BY RT-PCR (FLU A&B, COVID) ARPGX2  LACTIC ACID, PLASMA  AMMONIA  PROTIME-INR  APTT  URINALYSIS, COMPLETE (UACMP) WITH MICROSCOPIC    EKG EKG Interpretation  Date/Time:  Thursday May 11 2020 19:40:23 EST Ventricular Rate:  102 PR Interval:    QRS Duration: 122 QT Interval:  403 QTC Calculation: 520 R Axis:   99 Text Interpretation: Atrial flutter Consider left ventricular hypertrophy Nonspecific T abnormalities, diffuse leads Prolonged QT interval No significant change since last tracing Confirmed by Gwyneth Sprout ([id number]) on 05/11/2020 8:16:40 PM   Radiology CT HEAD WO CONTRAST  Result Date: 05/11/2020 CLINICAL DATA:  Mental status change, unknown cause EXAM: CT HEAD WITHOUT CONTRAST TECHNIQUE: Contiguous axial images were obtained from the base of the skull through the vertex without intravenous contrast. COMPARISON:  02/26/2020 and prior FINDINGS: Brain: Acute left thalamic hemorrhage measuring approximately 1.2 x 0.8 x 0.8 cm. Intraventricular extension involving the lateral, third and fourth ventricles. No midline shift. No ventriculomegaly. Mild cerebral atrophy with ex vacuo dilatation. Chronic microvascular ischemic changes. No mass lesion. Chronic right corona radiata lacunar insult. Vascular: No hyperdense vessel or unexpected calcification. Bilateral skull base atherosclerotic calcifications. Skull: Negative for fracture or focal lesion. Sinuses/Orbits: Normal orbits. Right sphenoid sinus layering secretions. No mastoid effusion. Other: White please note image quality is degraded by motion artifact. 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 Mild cerebral atrophy and chronic microvascular ischemic changes. Chronic right corona radiata lacunar  insult. These results were called by telephone at the time of interpretation on 05/11/2020 at 8:29 pm to provider [facility] , who verbally acknowledged these results. Electronically Signed  By: Stana Buntinghikanele  Emekauwa M.D.   On: 05/11/2020 20:31   DG Chest Port 1 View  Result Date: 05/11/2020 CLINICAL DATA:  Altered level of consciousness EXAM: PORTABLE CHEST 1 VIEW COMPARISON:  02/26/2020 FINDINGS: Single frontal view of the chest demonstrates stable postsurgical changes from CABG. Cardiac silhouette is unremarkable. No airspace disease, effusion, or pneumothorax. Prior healed bilateral rib fractures. IMPRESSION: 1. No acute intrathoracic process. Electronically Signed   By: Sharlet SalinaMichael  Brown M.D.   On: 05/11/2020 20:29    Procedures Procedures   Medications Ordered in ED Medications  clevidipine (CLEVIPREX) infusion 0.5 mg/mL (2 mg/hr Intravenous Rate/Dose Change 05/11/20 2058)  0.9 %  sodium chloride infusion (has no administration in time range)  ondansetron (ZOFRAN) injection 4 mg (4 mg Intravenous Given 05/11/20 2003)  prothrombin complex conc human (KCENTRA) IVPB 4,880 Units (0 Units Intravenous Stopped 05/11/20 2141)  labetalol (NORMODYNE) injection 20 mg (20 mg Intravenous Given 05/11/20 2115)    ED Course  I have reviewed the triage vital signs and the nursing notes.  Pertinent labs & imaging results that were available during my care of the patient were reviewed by me and considered in my medical decision making (see chart for details).    MDM Rules/Calculators/A&P                          Patient is a 70 year old male presenting today with altered mental status which was present since waking up this morning.  Patient on exam is nonfocal and altered.  Concern for sepsis versus acute stroke or bleed.  Patient is found to be in atrial fibrillation today with RVR between 100-120 but does have a prior history of atrial fibrillation and is on Eliquis.  He has no localized abdominal pain  and breath sounds are clear.  We will do both stroke and sepsis work-up.  Patient is noted to be extremely hypertensive here.  Inched soon as it is evaluated whether he has a bleed or not we will start controlling blood pressure.  While patient was getting labs and urine he had one episode of emesis but mental status has not changed.  Rectal temperature was mildly elevated at 100.6.   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  Patient was started on Cleviprex and was also given boluses of labetalol for heart rate control.  Goal blood pressure is less than 140 systolic.  Spoke with neurology about admission.  The rest of patient's labs are relatively benign.  Given he is on Eliquis reversed him with Kcentra as it is unclear when he last took his anticoagulant.  Suspect patient's fever is related to the head bleed and not sepsis.  [facility].  Upon leaving here blood pressure is 129/89.  Patient's mental status remains the same.  MDM Number of Diagnoses or Management Options   Amount and/or Complexity of Data Reviewed Clinical lab tests: ordered and reviewed Tests in the radiology section of CPT: ordered and reviewed Tests in the medicine section of CPT: ordered and reviewed Decide to obtain previous medical records or to obtain history from someone other than the patient: yes Obtain history from someone other than the patient: yes Review and summarize past medical records: yes Discuss the patient with other providers: yes Independent visualization  of images, tracings, or specimens: yes  Risk of Complications, Morbidity, and/or Mortality Presenting problems: high Diagnostic procedures: high Management options: high  Patient Progress Patient progress: stable  CRITICAL  CARE Performed by: Dorlene Footman Total critical care time: 30 minutes Critical care time was exclusive of separately billable procedures and treating other patients. Critical care was necessary to treat or prevent imminent or life-threatening deterioration. Critical care was time spent personally by me on the following activities: development of treatment plan with patient and/or surrogate as well as nursing, discussions with consultants, evaluation of patient's response to treatment, examination of patient, obtaining history from patient or surrogate, ordering and performing treatments and interventions, ordering and review of laboratory studies, ordering and review of radiographic studies, pulse oximetry and re-evaluation of patient's condition.  Final Clinical Impression(s) / ED Diagnoses Final diagnoses:  Intracranial hemorrhage (HCC)  Altered mental status, unspecified altered mental status type    Rx / DC Orders ED Discharge Orders    None       Gwyneth Sprout, MD 05/11/20 2227

## 2020-05-12 ENCOUNTER — Inpatient Hospital Stay (HOSPITAL_COMMUNITY): Payer: Medicare HMO

## 2020-05-12 ENCOUNTER — Encounter (HOSPITAL_COMMUNITY): Payer: Self-pay | Admitting: Neurology

## 2020-05-12 DIAGNOSIS — I6389 Other cerebral infarction: Secondary | ICD-10-CM | POA: Diagnosis not present

## 2020-05-12 DIAGNOSIS — I615 Nontraumatic intracerebral hemorrhage, intraventricular: Secondary | ICD-10-CM | POA: Diagnosis not present

## 2020-05-12 DIAGNOSIS — I611 Nontraumatic intracerebral hemorrhage in hemisphere, cortical: Secondary | ICD-10-CM

## 2020-05-12 DIAGNOSIS — I612 Nontraumatic intracerebral hemorrhage in hemisphere, unspecified: Secondary | ICD-10-CM | POA: Diagnosis not present

## 2020-05-12 DIAGNOSIS — R4182 Altered mental status, unspecified: Secondary | ICD-10-CM | POA: Diagnosis not present

## 2020-05-12 DIAGNOSIS — Z7189 Other specified counseling: Secondary | ICD-10-CM

## 2020-05-12 DIAGNOSIS — Z01818 Encounter for other preprocedural examination: Secondary | ICD-10-CM | POA: Diagnosis not present

## 2020-05-12 DIAGNOSIS — I161 Hypertensive emergency: Secondary | ICD-10-CM

## 2020-05-12 DIAGNOSIS — Z4659 Encounter for fitting and adjustment of other gastrointestinal appliance and device: Secondary | ICD-10-CM

## 2020-05-12 DIAGNOSIS — I610 Nontraumatic intracerebral hemorrhage in hemisphere, subcortical: Secondary | ICD-10-CM

## 2020-05-12 DIAGNOSIS — I483 Typical atrial flutter: Secondary | ICD-10-CM | POA: Diagnosis not present

## 2020-05-12 LAB — CBC WITH DIFFERENTIAL/PLATELET
Abs Immature Granulocytes: 0.03 10*3/uL (ref 0.00–0.07)
Basophils Absolute: 0 10*3/uL (ref 0.0–0.1)
Basophils Relative: 0 %
Eosinophils Absolute: 0 10*3/uL (ref 0.0–0.5)
Eosinophils Relative: 0 %
HCT: 46.3 % (ref 39.0–52.0)
Hemoglobin: 15 g/dL (ref 13.0–17.0)
Immature Granulocytes: 0 %
Lymphocytes Relative: 11 %
Lymphs Abs: 1.1 10*3/uL (ref 0.7–4.0)
MCH: 27.8 pg (ref 26.0–34.0)
MCHC: 32.4 g/dL (ref 30.0–36.0)
MCV: 85.9 fL (ref 80.0–100.0)
Monocytes Absolute: 0.7 10*3/uL (ref 0.1–1.0)
Monocytes Relative: 8 %
Neutro Abs: 7.7 10*3/uL (ref 1.7–7.7)
Neutrophils Relative %: 81 %
Platelets: 161 10*3/uL (ref 150–400)
RBC: 5.39 MIL/uL (ref 4.22–5.81)
RDW: 13.7 % (ref 11.5–15.5)
WBC: 9.6 10*3/uL (ref 4.0–10.5)
nRBC: 0 % (ref 0.0–0.2)

## 2020-05-12 LAB — POCT I-STAT 7, (LYTES, BLD GAS, ICA,H+H)
Acid-Base Excess: 2 mmol/L (ref 0.0–2.0)
Bicarbonate: 25.1 mmol/L (ref 20.0–28.0)
Calcium, Ion: 1.13 mmol/L — ABNORMAL LOW (ref 1.15–1.40)
HCT: 43 % (ref 39.0–52.0)
Hemoglobin: 14.6 g/dL (ref 13.0–17.0)
O2 Saturation: 99 %
Patient temperature: 99.5
Potassium: 3.4 mmol/L — ABNORMAL LOW (ref 3.5–5.1)
Sodium: 138 mmol/L (ref 135–145)
TCO2: 26 mmol/L (ref 22–32)
pCO2 arterial: 34.1 mmHg (ref 32.0–48.0)
pH, Arterial: 7.477 — ABNORMAL HIGH (ref 7.350–7.450)
pO2, Arterial: 113 mmHg — ABNORMAL HIGH (ref 83.0–108.0)

## 2020-05-12 LAB — PHOSPHORUS: Phosphorus: 3.3 mg/dL (ref 2.5–4.6)

## 2020-05-12 LAB — ECHOCARDIOGRAM COMPLETE
Area-P 1/2: 3.21 cm2
Calc EF: 50.2 %
Height: 70 in
S' Lateral: 3.4 cm
Single Plane A2C EF: 52.5 %
Single Plane A4C EF: 50 %
Weight: 3527.36 oz

## 2020-05-12 LAB — GLUCOSE, CAPILLARY
Glucose-Capillary: 114 mg/dL — ABNORMAL HIGH (ref 70–99)
Glucose-Capillary: 132 mg/dL — ABNORMAL HIGH (ref 70–99)
Glucose-Capillary: 201 mg/dL — ABNORMAL HIGH (ref 70–99)
Glucose-Capillary: 220 mg/dL — ABNORMAL HIGH (ref 70–99)
Glucose-Capillary: 240 mg/dL — ABNORMAL HIGH (ref 70–99)
Glucose-Capillary: 241 mg/dL — ABNORMAL HIGH (ref 70–99)
Glucose-Capillary: 245 mg/dL — ABNORMAL HIGH (ref 70–99)
Glucose-Capillary: 90 mg/dL (ref 70–99)

## 2020-05-12 LAB — RAPID URINE DRUG SCREEN, HOSP PERFORMED
Amphetamines: NOT DETECTED
Barbiturates: NOT DETECTED
Benzodiazepines: NOT DETECTED
Cocaine: NOT DETECTED
Opiates: NOT DETECTED
Tetrahydrocannabinol: NOT DETECTED

## 2020-05-12 LAB — COMPREHENSIVE METABOLIC PANEL
ALT: 25 U/L (ref 0–44)
AST: 25 U/L (ref 15–41)
Albumin: 3.2 g/dL — ABNORMAL LOW (ref 3.5–5.0)
Alkaline Phosphatase: 113 U/L (ref 38–126)
Anion gap: 12 (ref 5–15)
BUN: 10 mg/dL (ref 8–23)
CO2: 24 mmol/L (ref 22–32)
Calcium: 8.8 mg/dL — ABNORMAL LOW (ref 8.9–10.3)
Chloride: 102 mmol/L (ref 98–111)
Creatinine, Ser: 1.08 mg/dL (ref 0.61–1.24)
GFR, Estimated: >60 mL/min (ref >60)
Glucose, Bld: 206 mg/dL — ABNORMAL HIGH (ref 70–99)
Potassium: 3.5 mmol/L (ref 3.5–5.1)
Sodium: 138 mmol/L (ref 135–145)
Total Bilirubin: 1.1 mg/dL (ref 0.3–1.2)
Total Protein: 6.3 g/dL — ABNORMAL LOW (ref 6.5–8.1)

## 2020-05-12 LAB — LIPID PANEL
Cholesterol: 141 mg/dL (ref 0–200)
HDL: 46 mg/dL (ref >40)
LDL Cholesterol: 84 mg/dL (ref 0–99)
Total CHOL/HDL Ratio: 3.1 RATIO
Triglycerides: 54 mg/dL (ref <150)
VLDL: 11 mg/dL (ref 0–40)

## 2020-05-12 LAB — RPR: RPR Ser Ql: NONREACTIVE

## 2020-05-12 LAB — VITAMIN B12: Vitamin B-12: 407 pg/mL (ref 180–914)

## 2020-05-12 LAB — MAGNESIUM
Magnesium: 1.9 mg/dL (ref 1.7–2.4)
Magnesium: 2.4 mg/dL (ref 1.7–2.4)

## 2020-05-12 LAB — MRSA PCR SCREENING: MRSA by PCR: NEGATIVE

## 2020-05-12 LAB — HIV ANTIBODY (ROUTINE TESTING W REFLEX): HIV Screen 4th Generation wRfx: NONREACTIVE

## 2020-05-12 LAB — HEMOGLOBIN A1C
Hgb A1c MFr Bld: 9.4 % — ABNORMAL HIGH (ref 4.8–5.6)
Mean Plasma Glucose: 223.08 mg/dL

## 2020-05-12 MED ORDER — FENTANYL CITRATE (PF) 100 MCG/2ML IJ SOLN
50.0000 ug | INTRAMUSCULAR | Status: DC | PRN
  Administered 2020-05-12: 100 ug via INTRAVENOUS
  Filled 2020-05-12: qty 2

## 2020-05-12 MED ORDER — INSULIN GLARGINE 100 UNIT/ML ~~LOC~~ SOLN
15.0000 [IU] | Freq: Every day | SUBCUTANEOUS | Status: DC
  Administered 2020-05-12 – 2020-05-15 (×4): 15 [IU] via SUBCUTANEOUS
  Filled 2020-05-12 (×5): qty 0.15

## 2020-05-12 MED ORDER — POLYETHYLENE GLYCOL 3350 17 G PO PACK
17.0000 g | PACK | Freq: Every day | ORAL | Status: DC
  Administered 2020-05-12 – 2020-05-24 (×10): 17 g
  Filled 2020-05-12 (×13): qty 1

## 2020-05-12 MED ORDER — SODIUM CHLORIDE 0.9 % IV BOLUS
500.0000 mL | Freq: Once | INTRAVENOUS | Status: AC
  Administered 2020-05-12: 500 mL via INTRAVENOUS

## 2020-05-12 MED ORDER — SENNOSIDES-DOCUSATE SODIUM 8.6-50 MG PO TABS
1.0000 | ORAL_TABLET | Freq: Two times a day (BID) | ORAL | Status: DC
  Administered 2020-05-12 – 2020-05-25 (×18): 1
  Filled 2020-05-12 (×20): qty 1

## 2020-05-12 MED ORDER — FENTANYL CITRATE (PF) 100 MCG/2ML IJ SOLN
INTRAMUSCULAR | Status: AC
  Administered 2020-05-12: 100 ug via INTRAVENOUS
  Filled 2020-05-12: qty 2

## 2020-05-12 MED ORDER — MIDAZOLAM HCL 2 MG/2ML IJ SOLN
INTRAMUSCULAR | Status: AC
  Administered 2020-05-12: 2 mg via INTRAVENOUS
  Filled 2020-05-12: qty 4

## 2020-05-12 MED ORDER — INSULIN ASPART 100 UNIT/ML ~~LOC~~ SOLN
0.0000 [IU] | SUBCUTANEOUS | Status: DC
  Administered 2020-05-12: 7 [IU] via SUBCUTANEOUS
  Administered 2020-05-12: 3 [IU] via SUBCUTANEOUS
  Administered 2020-05-13: 4 [IU] via SUBCUTANEOUS
  Administered 2020-05-13: 7 [IU] via SUBCUTANEOUS
  Administered 2020-05-13: 3 [IU] via SUBCUTANEOUS
  Administered 2020-05-13: 7 [IU] via SUBCUTANEOUS
  Administered 2020-05-14: 4 [IU] via SUBCUTANEOUS
  Administered 2020-05-14: 11 [IU] via SUBCUTANEOUS
  Administered 2020-05-14: 4 [IU] via SUBCUTANEOUS
  Administered 2020-05-14: 11 [IU] via SUBCUTANEOUS
  Administered 2020-05-14: 7 [IU] via SUBCUTANEOUS
  Administered 2020-05-14: 11 [IU] via SUBCUTANEOUS
  Administered 2020-05-14: 7 [IU] via SUBCUTANEOUS
  Administered 2020-05-15 (×2): 4 [IU] via SUBCUTANEOUS
  Administered 2020-05-15 (×2): 7 [IU] via SUBCUTANEOUS
  Administered 2020-05-15: 4 [IU] via SUBCUTANEOUS
  Administered 2020-05-16 (×2): 7 [IU] via SUBCUTANEOUS
  Administered 2020-05-16 (×2): 4 [IU] via SUBCUTANEOUS
  Administered 2020-05-16: 15 [IU] via SUBCUTANEOUS
  Administered 2020-05-16 – 2020-05-17 (×3): 4 [IU] via SUBCUTANEOUS
  Administered 2020-05-17: 7 [IU] via SUBCUTANEOUS
  Administered 2020-05-17: 4 [IU] via SUBCUTANEOUS
  Administered 2020-05-18: 7 [IU] via SUBCUTANEOUS
  Administered 2020-05-18 (×2): 4 [IU] via SUBCUTANEOUS
  Administered 2020-05-18 (×2): 3 [IU] via SUBCUTANEOUS
  Administered 2020-05-18: 11 [IU] via SUBCUTANEOUS
  Administered 2020-05-18 – 2020-05-19 (×3): 4 [IU] via SUBCUTANEOUS
  Administered 2020-05-19: 3 [IU] via SUBCUTANEOUS
  Administered 2020-05-19 – 2020-05-20 (×6): 4 [IU] via SUBCUTANEOUS
  Administered 2020-05-21: 3 [IU] via SUBCUTANEOUS
  Administered 2020-05-21: 7 [IU] via SUBCUTANEOUS
  Administered 2020-05-21: 3 [IU] via SUBCUTANEOUS
  Administered 2020-05-21: 4 [IU] via SUBCUTANEOUS
  Administered 2020-05-22 (×3): 3 [IU] via SUBCUTANEOUS
  Administered 2020-05-22 – 2020-05-23 (×5): 4 [IU] via SUBCUTANEOUS
  Administered 2020-05-24: 7 [IU] via SUBCUTANEOUS
  Administered 2020-05-24: 4 [IU] via SUBCUTANEOUS
  Administered 2020-05-24 (×2): 7 [IU] via SUBCUTANEOUS
  Administered 2020-05-24 – 2020-05-25 (×2): 3 [IU] via SUBCUTANEOUS
  Administered 2020-05-25: 4 [IU] via SUBCUTANEOUS
  Administered 2020-05-25: 3 [IU] via SUBCUTANEOUS
  Administered 2020-05-25: 4 [IU] via SUBCUTANEOUS
  Administered 2020-05-25 – 2020-05-26 (×2): 3 [IU] via SUBCUTANEOUS
  Administered 2020-05-26: 4 [IU] via SUBCUTANEOUS
  Administered 2020-05-26: 3 [IU] via SUBCUTANEOUS
  Administered 2020-05-26: 7 [IU] via SUBCUTANEOUS
  Administered 2020-05-26 (×2): 4 [IU] via SUBCUTANEOUS
  Administered 2020-05-27: 3 [IU] via SUBCUTANEOUS
  Administered 2020-05-27 (×2): 4 [IU] via SUBCUTANEOUS
  Administered 2020-05-27 (×2): 3 [IU] via SUBCUTANEOUS
  Administered 2020-05-28 – 2020-05-29 (×6): 4 [IU] via SUBCUTANEOUS
  Administered 2020-05-29: 3 [IU] via SUBCUTANEOUS
  Administered 2020-05-29 – 2020-05-30 (×2): 4 [IU] via SUBCUTANEOUS
  Administered 2020-05-30 (×2): 3 [IU] via SUBCUTANEOUS
  Administered 2020-05-30: 7 [IU] via SUBCUTANEOUS
  Administered 2020-05-31: 4 [IU] via SUBCUTANEOUS
  Administered 2020-05-31: 3 [IU] via SUBCUTANEOUS
  Administered 2020-06-01: 4 [IU] via SUBCUTANEOUS
  Administered 2020-06-01 (×2): 3 [IU] via SUBCUTANEOUS
  Administered 2020-06-01: 7 [IU] via SUBCUTANEOUS
  Administered 2020-06-01: 4 [IU] via SUBCUTANEOUS
  Administered 2020-06-01: 3 [IU] via SUBCUTANEOUS
  Administered 2020-06-02 (×2): 4 [IU] via SUBCUTANEOUS
  Administered 2020-06-02 – 2020-06-03 (×8): 7 [IU] via SUBCUTANEOUS
  Administered 2020-06-03 – 2020-06-04 (×2): 4 [IU] via SUBCUTANEOUS
  Administered 2020-06-04: 11 [IU] via SUBCUTANEOUS
  Administered 2020-06-04 (×2): 3 [IU] via SUBCUTANEOUS
  Administered 2020-06-04: 4 [IU] via SUBCUTANEOUS
  Administered 2020-06-04: 3 [IU] via SUBCUTANEOUS
  Administered 2020-06-05: 7 [IU] via SUBCUTANEOUS
  Administered 2020-06-05: 11 [IU] via SUBCUTANEOUS
  Administered 2020-06-05: 7 [IU] via SUBCUTANEOUS
  Administered 2020-06-05: 4 [IU] via SUBCUTANEOUS
  Administered 2020-06-05: 7 [IU] via SUBCUTANEOUS
  Administered 2020-06-05: 11 [IU] via SUBCUTANEOUS
  Administered 2020-06-06 (×3): 7 [IU] via SUBCUTANEOUS
  Administered 2020-06-06: 2 [IU] via SUBCUTANEOUS
  Administered 2020-06-06: 4 [IU] via SUBCUTANEOUS
  Administered 2020-06-06: 7 [IU] via SUBCUTANEOUS
  Administered 2020-06-07: 4 [IU] via SUBCUTANEOUS
  Administered 2020-06-07: 7 [IU] via SUBCUTANEOUS
  Administered 2020-06-07 (×2): 4 [IU] via SUBCUTANEOUS
  Administered 2020-06-07: 7 [IU] via SUBCUTANEOUS
  Administered 2020-06-07 – 2020-06-08 (×3): 4 [IU] via SUBCUTANEOUS
  Administered 2020-06-08: 7 [IU] via SUBCUTANEOUS
  Administered 2020-06-08: 4 [IU] via SUBCUTANEOUS

## 2020-05-12 MED ORDER — ETOMIDATE 2 MG/ML IV SOLN
INTRAVENOUS | Status: AC
  Administered 2020-05-12: 40 mg via INTRAVENOUS
  Filled 2020-05-12: qty 20

## 2020-05-12 MED ORDER — LACTATED RINGERS IV BOLUS
500.0000 mL | Freq: Once | INTRAVENOUS | Status: AC
  Administered 2020-05-12: 500 mL via INTRAVENOUS

## 2020-05-12 MED ORDER — FENTANYL BOLUS VIA INFUSION
25.0000 ug | INTRAVENOUS | Status: DC | PRN
  Filled 2020-05-12: qty 25

## 2020-05-12 MED ORDER — ROCURONIUM BROMIDE 50 MG/5ML IV SOLN
100.0000 mg | Freq: Once | INTRAVENOUS | Status: AC
  Administered 2020-05-12: 100 mg via INTRAVENOUS
  Filled 2020-05-12: qty 10

## 2020-05-12 MED ORDER — METOPROLOL TARTRATE 5 MG/5ML IV SOLN
5.0000 mg | Freq: Four times a day (QID) | INTRAVENOUS | Status: DC
  Administered 2020-05-12: 5 mg via INTRAVENOUS
  Filled 2020-05-12: qty 5

## 2020-05-12 MED ORDER — DEXMEDETOMIDINE HCL IN NACL 400 MCG/100ML IV SOLN
0.4000 ug/kg/h | INTRAVENOUS | Status: DC
  Administered 2020-05-12: 0.4 ug/kg/h via INTRAVENOUS
  Administered 2020-05-12 – 2020-05-14 (×10): 1.2 ug/kg/h via INTRAVENOUS
  Administered 2020-05-14: 0.9 ug/kg/h via INTRAVENOUS
  Administered 2020-05-14 (×2): 1.2 ug/kg/h via INTRAVENOUS
  Administered 2020-05-15: 0.8 ug/kg/h via INTRAVENOUS
  Administered 2020-05-15: 0.7 ug/kg/h via INTRAVENOUS
  Administered 2020-05-15: 1.2 ug/kg/h via INTRAVENOUS
  Administered 2020-05-15 – 2020-05-16 (×5): 1 ug/kg/h via INTRAVENOUS
  Filled 2020-05-12 (×25): qty 100

## 2020-05-12 MED ORDER — POTASSIUM CHLORIDE 20 MEQ PO PACK
20.0000 meq | PACK | Freq: Once | ORAL | Status: AC
  Administered 2020-05-12: 20 meq
  Filled 2020-05-12: qty 1

## 2020-05-12 MED ORDER — FENTANYL 2500MCG IN NS 250ML (10MCG/ML) PREMIX INFUSION
25.0000 ug/h | INTRAVENOUS | Status: DC
  Administered 2020-05-12: 100 ug/h via INTRAVENOUS
  Administered 2020-05-13: 75 ug/h via INTRAVENOUS
  Administered 2020-05-14: 50 ug/h via INTRAVENOUS
  Administered 2020-05-15 – 2020-05-16 (×2): 100 ug/h via INTRAVENOUS
  Filled 2020-05-12 (×5): qty 250

## 2020-05-12 MED ORDER — FENTANYL CITRATE (PF) 100 MCG/2ML IJ SOLN
50.0000 ug | INTRAMUSCULAR | Status: AC | PRN
  Administered 2020-05-13 – 2020-05-16 (×3): 50 ug via INTRAVENOUS

## 2020-05-12 MED ORDER — PHENYLEPHRINE HCL-NACL 10-0.9 MG/250ML-% IV SOLN
INTRAVENOUS | Status: AC
  Administered 2020-05-12: 25 ug/min via INTRAVENOUS
  Filled 2020-05-12: qty 250

## 2020-05-12 MED ORDER — OSMOLITE 1.5 CAL PO LIQD
1000.0000 mL | ORAL | Status: DC
  Administered 2020-05-12 – 2020-05-17 (×6): 1000 mL
  Filled 2020-05-12 (×3): qty 1000

## 2020-05-12 MED ORDER — THIAMINE HCL 100 MG/ML IJ SOLN
500.0000 mg | Freq: Three times a day (TID) | INTRAVENOUS | Status: AC
  Administered 2020-05-12 – 2020-05-14 (×9): 500 mg via INTRAVENOUS
  Filled 2020-05-12 (×9): qty 5

## 2020-05-12 MED ORDER — POTASSIUM CHLORIDE 20 MEQ PO PACK
40.0000 meq | PACK | Freq: Once | ORAL | Status: DC
  Filled 2020-05-12: qty 2

## 2020-05-12 MED ORDER — INSULIN ASPART 100 UNIT/ML ~~LOC~~ SOLN
4.0000 [IU] | SUBCUTANEOUS | Status: DC
  Administered 2020-05-12 – 2020-05-15 (×17): 4 [IU] via SUBCUTANEOUS

## 2020-05-12 MED ORDER — MAGNESIUM SULFATE 2 GM/50ML IV SOLN
2.0000 g | Freq: Once | INTRAVENOUS | Status: AC
  Administered 2020-05-12: 2 g via INTRAVENOUS
  Filled 2020-05-12: qty 50

## 2020-05-12 MED ORDER — DOCUSATE SODIUM 50 MG/5ML PO LIQD
100.0000 mg | Freq: Two times a day (BID) | ORAL | Status: DC
  Administered 2020-05-12: 100 mg
  Filled 2020-05-12: qty 10

## 2020-05-12 MED ORDER — PROPOFOL 1000 MG/100ML IV EMUL
5.0000 ug/kg/min | INTRAVENOUS | Status: DC
  Filled 2020-05-12: qty 100

## 2020-05-12 MED ORDER — POTASSIUM CHLORIDE 20 MEQ PO PACK: 40.0000 meq | PACK | Freq: Two times a day (BID) | ORAL | Status: DC

## 2020-05-12 MED ORDER — PROSOURCE TF PO LIQD
45.0000 mL | Freq: Three times a day (TID) | ORAL | Status: DC
  Administered 2020-05-12 – 2020-06-07 (×75): 45 mL
  Filled 2020-05-12 (×74): qty 45

## 2020-05-12 MED ORDER — MIDAZOLAM HCL 2 MG/2ML IJ SOLN: 2.0000 mg | Freq: Once | INTRAMUSCULAR | Status: AC

## 2020-05-12 MED ORDER — ETOMIDATE 2 MG/ML IV SOLN: 20.0000 mg | Freq: Once | INTRAVENOUS | Status: AC

## 2020-05-12 MED ORDER — CEFAZOLIN SODIUM-DEXTROSE 1-4 GM/50ML-% IV SOLN
1.0000 g | Freq: Three times a day (TID) | INTRAVENOUS | Status: DC
  Administered 2020-05-12 – 2020-05-17 (×15): 1 g via INTRAVENOUS
  Filled 2020-05-12 (×16): qty 50

## 2020-05-12 MED ORDER — FENTANYL CITRATE (PF) 100 MCG/2ML IJ SOLN: 100.0000 ug | Freq: Once | INTRAMUSCULAR | Status: AC

## 2020-05-12 MED ORDER — MIDAZOLAM HCL 2 MG/2ML IJ SOLN
2.0000 mg | Freq: Once | INTRAMUSCULAR | Status: AC
  Administered 2020-05-12: 2 mg via INTRAVENOUS

## 2020-05-12 MED ORDER — PHENYLEPHRINE HCL-NACL 10-0.9 MG/250ML-% IV SOLN: 0.0000 ug/min | INTRAVENOUS | Status: DC

## 2020-05-12 MED ORDER — ORAL CARE MOUTH RINSE
15.0000 mL | OROMUCOSAL | Status: DC
  Administered 2020-05-12 – 2020-05-16 (×43): 15 mL via OROMUCOSAL

## 2020-05-12 MED ORDER — CHLORHEXIDINE GLUCONATE 0.12% ORAL RINSE (MEDLINE KIT)
15.0000 mL | Freq: Two times a day (BID) | OROMUCOSAL | Status: DC
  Administered 2020-05-12 – 2020-05-20 (×17): 15 mL via OROMUCOSAL

## 2020-05-12 NOTE — Progress Notes (Signed)
PT Cancellation Note  Patient Details Name: Neils Siracusa MRN: [id number] DOB: [birth year]   Cancelled Treatment:    Reason Eval/Treat Not Completed: Active bedrest order this morning. PT will continue to follow and evaluate as appropriate.   Rolm Baptise, PT, DPT   Acute Rehabilitation Department Pager #: [phone]   Gaetana Michaelis 05/12/2020, 7:53 AM

## 2020-05-12 NOTE — Progress Notes (Signed)
OT Cancellation Note  Patient Details Name: Jason [name] MRN: [id number] DOB: Nov 01, 1950   Cancelled Treatment:    Reason Eval/Treat Not Completed: Patient not medically ready  Wynona Neat, OTR/L  Acute Rehabilitation Services Pager: [phone] Office: [phone] .  05/12/2020, 8:33 AM

## 2020-05-12 NOTE — Consult Note (Signed)
Reason for Consult: Referring Physician: Gordy Councilman, MD  HPI: Jason [name] is a 70 y.o. male with a past medical history of CAD, DM2, HTN, a-fib on Eliquis, and CVA. The patient was found to be confused this morning. He was LKW 2/23. EMS was called, but he was not initially transported to the emergency department for some reason. His mental status worsened and EMS was called again, this time transporting the patient to [facility] ED. In the ED he was noted to be febrile prompting an infectious workup. CT of the head showed ICH with intraventricular extension with concern for hydrocephalus. SBP on presentation 205. K Centra was given for Eliquis reversal. He was admitted by neurology to ICU where unfortunately his mental status continued to decline. PCCM was consulted for intubation. Neurosurgery consulted for evaluation and possible ventriculostomy placement.   Past Medical History:  Diagnosis Date  . Anxiety   . Coronary artery disease   . Depression   . Diabetes mellitus without complication (HCC)   . Dysrhythmia   . Hypertension   . Myocardial infarction (HCC)   . Stroke [facility])     Past Surgical History:  Procedure Laterality Date  . CORONARY ARTERY BYPASS GRAFT      No family history on file.  Social History:  reports that he has never smoked. He has never used smokeless tobacco. He reports previous alcohol use. He reports previous drug use.  Allergies: No Known Allergies  Medications: I have reviewed the patient's current medications.  Results for orders placed or performed during the hospital encounter of 05/11/20 (from the past 48 hour(s))  Comprehensive metabolic panel     Status: Abnormal   Collection Time: 05/11/20  7:36 PM  Result Value Ref Range   Sodium 139 135 - 145 mmol/L   Potassium 3.4 (L) 3.5 - 5.1 mmol/L   Chloride 100 98 - 111 mmol/L   CO2 28 22 - 32 mmol/L   Glucose, Bld 166 (H) 70 - 99 mg/dL    Comment: Glucose reference range applies only to samples  taken after fasting for at least 8 hours.   BUN 8 8 - 23 mg/dL   Creatinine, Ser 1.61 0.61 - 1.24 mg/dL   Calcium 9.3 8.9 - 09.6 mg/dL   Total Protein 8.1 6.5 - 8.1 g/dL   Albumin 4.4 3.5 - 5.0 g/dL   AST 29 15 - 41 U/L   ALT 30 0 - 44 U/L   Alkaline Phosphatase 144 (H) 38 - 126 U/L   Total Bilirubin 0.8 0.3 - 1.2 mg/dL   GFR, Estimated >04 >54 mL/min    Comment: (NOTE) Calculated using the CKD-EPI Creatinine Equation (2021)    Anion gap 11 5 - 15    Comment: Performed at [facility], [address], [place], [place] [address]  CBC WITH DIFFERENTIAL     Status: Abnormal   Collection Time: 05/11/20  7:36 PM  Result Value Ref Range   WBC 9.2 4.0 - 10.5 K/uL   RBC 6.04 (H) 4.22 - 5.81 MIL/uL   Hemoglobin 17.2 (H) 13.0 - 17.0 g/dL   HCT 91.4 (H) 78.2 - 95.6 %   MCV 86.3 80.0 - 100.0 fL   MCH 28.5 26.0 - 34.0 pg   MCHC 33.0 30.0 - 36.0 g/dL   RDW 21.3 08.6 - 57.8 %   Platelets 163 150 - 400 K/uL   nRBC 0.0 0.0 - 0.2 %   Neutrophils Relative % 84 %  Neutro Abs 7.8 (H) 1.7 - 7.7 K/uL   Lymphocytes Relative 10 %   Lymphs Abs 1.0 0.7 - 4.0 K/uL   Monocytes Relative 5 %   Monocytes Absolute 0.4 0.1 - 1.0 K/uL   Eosinophils Relative 0 %   Eosinophils Absolute 0.0 0.0 - 0.5 K/uL   Basophils Relative 1 %   Basophils Absolute 0.1 0.0 - 0.1 K/uL   Immature Granulocytes 0 %   Abs Immature Granulocytes 0.03 0.00 - 0.07 K/uL    Comment: Performed at [facility], [address] [place], [place] [address]  Lactic acid, plasma     Status: None   Collection Time: 05/11/20  7:36 PM  Result Value Ref Range   Lactic Acid, Venous 1.6 0.5 - 1.9 mmol/L    Comment: Performed at [facility], [address], [place], [place] [address]  Ammonia     Status: None   Collection Time: 05/11/20  7:36 PM  Result Value Ref Range   Ammonia 13 9 - 35 umol/L    Comment: Performed at [facility], [address],  [place], [place] [address]  CBG monitoring, ED     Status: Abnormal   Collection Time: 05/11/20  7:44 PM  Result Value Ref Range   Glucose-Capillary 153 (H) 70 - 99 mg/dL    Comment: Glucose reference range applies only to samples taken after fasting for at least 8 hours.  Protime-INR     Status: None   Collection Time: 05/11/20  8:18 PM  Result Value Ref Range   Prothrombin Time 13.5 11.4 - 15.2 seconds   INR 1.1 0.8 - 1.2    Comment: (NOTE) INR goal varies based on device and disease states. Performed at [facility], [address], [place], [place] [address]   APTT     Status: None   Collection Time: 05/11/20  8:18 PM  Result Value Ref Range   aPTT 30 24 - 36 seconds    Comment: Performed at [facility], [address], [place], [place] [address]  Heparin level - if patient on rivaroxaban Carlena Hurl) or apixaban Everlene Balls)     Status: Abnormal   Collection Time: 05/11/20  8:18 PM  Result Value Ref Range   Heparin Unfractionated 1.10 (H) 0.30 - 0.70 IU/mL    Comment: (NOTE) If heparin results are below expected values, and patient dosage has  been confirmed, suggest follow up testing of antithrombin III levels. Performed at [facility], [address], [place], [place] [address]   Urinalysis, Complete w Microscopic Urine, Catheterized     Status: Abnormal   Collection Time: 05/11/20  9:00 PM  Result Value Ref Range   Color, Urine YELLOW YELLOW   APPearance HAZY (A) CLEAR   Specific Gravity, Urine 1.011 1.005 - 1.030   pH 7.0 5.0 - 8.0   Glucose, UA 50 (A) NEGATIVE mg/dL   Hgb urine dipstick LARGE (A) NEGATIVE   Bilirubin Urine NEGATIVE NEGATIVE   Ketones, ur 20 (A) NEGATIVE mg/dL   Protein, ur 578 (A) NEGATIVE mg/dL   Nitrite NEGATIVE NEGATIVE   Leukocytes,Ua MODERATE (A) NEGATIVE   RBC / HPF >50 (H) 0 - 5 RBC/hpf   WBC, UA 11-20 0 - 5 WBC/hpf   Bacteria, UA RARE (A) NONE SEEN   Squamous Epithelial / LPF 0-5 0 - 5    Mucus PRESENT     Comment: Performed at [facility], [address] [place], [place] [address]  Resp Panel by RT-PCR (Flu A&B, Covid) Nasopharyngeal Swab     Status: None   Collection Time: 05/11/20  9:30 PM   Specimen: Nasopharyngeal Swab; Nasopharyngeal(NP) swabs in vial transport medium  Result Value Ref Range   SARS Coronavirus 2 by RT PCR NEGATIVE NEGATIVE    Comment: (NOTE) SARS-CoV-2 target nucleic acids are NOT DETECTED.  The SARS-CoV-2 RNA is generally detectable in upper respiratory specimens during the acute phase of infection. The lowest concentration of SARS-CoV-2 viral copies this assay can detect is 138 copies/mL. A negative result does not preclude SARS-Cov-2 infection and should not be used as the sole basis for treatment or other patient management decisions. A negative result may occur with  improper specimen collection/handling, submission of specimen other than nasopharyngeal swab, presence of viral mutation(s) within the areas targeted by this assay, and inadequate number of viral copies(<138 copies/mL). A negative result must be combined with clinical observations, patient history, and epidemiological information. The expected result is Negative.  Fact Sheet for Patients:  BloggerCourse.com  Fact Sheet for Healthcare Providers:  SeriousBroker.it  This test is no t yet approved or cleared by the [place] FDA and  has been authorized for detection and/or diagnosis of SARS-CoV-2 by FDA under an Emergency Use Authorization (EUA). This EUA will remain  in effect (meaning this test can be used) for the duration of the COVID-19 declaration under Section 564(b)(1) of the Act, 21 U.S.C.section 360bbb-3(b)(1), unless the authorization is terminated  or revoked sooner.       Influenza A by PCR NEGATIVE NEGATIVE   Influenza B by PCR NEGATIVE NEGATIVE    Comment: (NOTE) The Xpert Xpress  SARS-CoV-2/FLU/RSV plus assay is intended as an aid in the diagnosis of influenza from Nasopharyngeal swab specimens and should not be used as a sole basis for treatment. Nasal washings and aspirates are unacceptable for Xpert Xpress SARS-CoV-2/FLU/RSV testing.  Fact Sheet for Patients: BloggerCourse.com  Fact Sheet for Healthcare Providers: SeriousBroker.it  This test is not yet approved or cleared by the [place] FDA and has been authorized for detection and/or diagnosis of SARS-CoV-2 by FDA under an Emergency Use Authorization (EUA). This EUA will remain in effect (meaning this test can be used) for the duration of the COVID-19 declaration under Section 564(b)(1) of the Act, 21 U.S.C. section 360bbb-3(b)(1), unless the authorization is terminated or revoked.  Performed at [facility], [address] [place], [place] [address]   Hemoglobin A1c     Status: Abnormal   Collection Time: 05/11/20 11:40 PM  Result Value Ref Range   Hgb A1c MFr Bld 9.4 (H) 4.8 - 5.6 %    Comment: (NOTE) Pre diabetes:          5.7%-6.4%  Diabetes:              >6.4%  Glycemic control for   <7.0% adults with diabetes    Mean Plasma Glucose 223.08 mg/dL    Comment: Performed at [facility], [address] [place], [place] [address]  Lipid panel     Status: None   Collection Time: 05/11/20 11:48 PM  Result Value Ref Range   Cholesterol 141 0 - 200 mg/dL   Triglycerides 54 <793 mg/dL   HDL 46 >90 mg/dL   Total CHOL/HDL Ratio 3.1 RATIO   VLDL 11 0 - 40 mg/dL   LDL Cholesterol 84 0 - 99 mg/dL    Comment:  Total Cholesterol/HDL:CHD Risk Coronary Heart Disease Risk Table                     Men   Women  1/2 Average Risk   3.4   3.3  Average Risk       5.0   4.4  2 X Average Risk   9.6   7.1  3 X Average Risk  23.4   11.0        Use the calculated Patient Ratio above and the CHD Risk Table to determine  the patient's CHD Risk.        ATP III CLASSIFICATION (LDL):  <100     mg/dL   Optimal  [phone]  mg/dL   Near or Above                    Optimal  130-159  mg/dL   Borderline  [phone]  mg/dL   High  >956>190     mg/dL   Very High Performed at [facility], [address] [place], [place] [address]   Glucose, capillary     Status: Abnormal   Collection Time: 05/12/20 12:03 AM  Result Value Ref Range   Glucose-Capillary 240 (H) 70 - 99 mg/dL    Comment: Glucose reference range applies only to samples taken after fasting for at least 8 hours.  MRSA PCR Screening     Status: None   Collection Time: 05/12/20 12:26 AM   Specimen: Nasopharyngeal  Result Value Ref Range   MRSA by PCR NEGATIVE NEGATIVE    Comment:        The GeneXpert MRSA Assay (FDA approved for NASAL specimens only), is one component of a comprehensive MRSA colonization surveillance program. It is not intended to diagnose MRSA infection nor to guide or monitor treatment for MRSA infections. Performed at [facility], [address] [place], [place] [address]   Glucose, capillary     Status: Abnormal   Collection Time: 05/12/20  2:48 AM  Result Value Ref Range   Glucose-Capillary 241 (H) 70 - 99 mg/dL    Comment: Glucose reference range applies only to samples taken after fasting for at least 8 hours.  Glucose, capillary     Status: Abnormal   Collection Time: 05/12/20  4:03 AM  Result Value Ref Range   Glucose-Capillary 201 (H) 70 - 99 mg/dL    Comment: Glucose reference range applies only to samples taken after fasting for at least 8 hours.  I-STAT 7, (LYTES, BLD GAS, ICA, H+H)     Status: Abnormal   Collection Time: 05/12/20  4:13 AM  Result Value Ref Range   pH, Arterial 7.477 (H) 7.350 - 7.450   pCO2 arterial 34.1 32.0 - 48.0 mmHg   pO2, Arterial 113 (H) 83.0 - 108.0 mmHg   Bicarbonate 25.1 20.0 - 28.0 mmol/L   TCO2 26 22 - 32 mmol/L   O2 Saturation 99.0 %   Acid-Base Excess 2.0 0.0 - 2.0  mmol/L   Sodium 138 135 - 145 mmol/L   Potassium 3.4 (L) 3.5 - 5.1 mmol/L   Calcium, Ion 1.13 (L) 1.15 - 1.40 mmol/L   HCT 43.0 39.0 - 52.0 %   Hemoglobin 14.6 13.0 - 17.0 g/dL   Patient temperature 69.699.5 F    Collection site Radial    Drawn by RT    Sample type ARTERIAL   CBC with Differential/Platelet     Status: None  Collection Time: 05/12/20  4:15 AM  Result Value Ref Range   WBC 9.6 4.0 - 10.5 K/uL   RBC 5.39 4.22 - 5.81 MIL/uL   Hemoglobin 15.0 13.0 - 17.0 g/dL   HCT 69.6 29.5 - 28.4 %   MCV 85.9 80.0 - 100.0 fL   MCH 27.8 26.0 - 34.0 pg   MCHC 32.4 30.0 - 36.0 g/dL   RDW 13.2 44.0 - 10.2 %   Platelets 161 150 - 400 K/uL   nRBC 0.0 0.0 - 0.2 %   Neutrophils Relative % 81 %   Neutro Abs 7.7 1.7 - 7.7 K/uL   Lymphocytes Relative 11 %   Lymphs Abs 1.1 0.7 - 4.0 K/uL   Monocytes Relative 8 %   Monocytes Absolute 0.7 0.1 - 1.0 K/uL   Eosinophils Relative 0 %   Eosinophils Absolute 0.0 0.0 - 0.5 K/uL   Basophils Relative 0 %   Basophils Absolute 0.0 0.0 - 0.1 K/uL   Immature Granulocytes 0 %   Abs Immature Granulocytes 0.03 0.00 - 0.07 K/uL    Comment: Performed at [facility], [address], [place], [place] [address]  Urine rapid drug screen (hosp performed)not at [facility]     Status: None   Collection Time: 05/12/20  4:32 AM  Result Value Ref Range   Opiates NONE DETECTED NONE DETECTED   Cocaine NONE DETECTED NONE DETECTED   Benzodiazepines NONE DETECTED NONE DETECTED   Amphetamines NONE DETECTED NONE DETECTED   Tetrahydrocannabinol NONE DETECTED NONE DETECTED   Barbiturates NONE DETECTED NONE DETECTED    Comment: (NOTE) DRUG SCREEN FOR MEDICAL PURPOSES ONLY.  IF CONFIRMATION IS NEEDED FOR ANY PURPOSE, NOTIFY LAB WITHIN 5 DAYS.  LOWEST DETECTABLE LIMITS FOR URINE DRUG SCREEN Drug Class                     Cutoff (ng/mL) Amphetamine and metabolites    1000 Barbiturate and metabolites    200 Benzodiazepine                 200 Tricyclics and metabolites      300 Opiates and metabolites        300 Cocaine and metabolites        300 THC                            50 Performed at [facility], [address], [place], [place] [address]     CT HEAD WO CONTRAST  Result Date: 05/12/2020 CLINICAL DATA:  Follow-up intracranial hemorrhage EXAM: CT HEAD WITHOUT CONTRAST TECHNIQUE: Contiguous axial images were obtained from the base of the skull through the vertex without intravenous contrast. COMPARISON:  05/11/2020 FINDINGS: Brain: Medial left thalamic intraparenchymal hemorrhage appears the same, approximately 1 cm in size. Extension into the ventricular system with the amount of intraventricular blood being very similar. Filling of the fourth ventricle. Dependent blood in both occipital horns. Filling of the third ventricle. Areas of clot in both lateral ventricles. Ventriculomegaly appears the same without further dilatation. Elsewhere, chronic small-vessel ischemic changes are present throughout the white matter. Vascular: There is atherosclerotic calcification of the major vessels at the base of the brain. Skull: Negative Sinuses/Orbits: Fluid level in the sphenoid sinus consistent with rhinosinusitis. Orbits negative Other: None IMPRESSION: 1. Medial left thalamic intraparenchymal hemorrhage appears the same, approximately 1 cm in size. Extension into the ventricular system with the amount of  intraventricular blood being very similar. 2. Ventriculomegaly appears the same without further dilatation. 3. Chronic small-vessel ischemic changes of the white matter. 4. Fluid level in the sphenoid sinus consistent with rhinosinusitis. Electronically Signed   By: Paulina Fusi M.D.   On: 05/12/2020 02:26   CT HEAD WO CONTRAST  Result Date: 05/11/2020 CLINICAL DATA:  Mental status change, unknown cause EXAM: CT HEAD WITHOUT CONTRAST TECHNIQUE: Contiguous axial images were obtained from the base of the skull through the vertex without intravenous contrast.  COMPARISON:  02/26/2020 and prior FINDINGS: Brain: Acute left thalamic hemorrhage measuring approximately 1.2 x 0.8 x 0.8 cm. Intraventricular extension involving the lateral, third and fourth ventricles. No midline shift. No ventriculomegaly. Mild cerebral atrophy with ex vacuo dilatation. Chronic microvascular ischemic changes. No mass lesion. Chronic right corona radiata lacunar insult. Vascular: No hyperdense vessel or unexpected calcification. Bilateral skull base atherosclerotic calcifications. Skull: Negative for fracture or focal lesion. Sinuses/Orbits: Normal orbits. Right sphenoid sinus layering secretions. No mastoid effusion. Other: White please note image quality is degraded by motion artifact. 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 Mild cerebral atrophy and chronic microvascular ischemic changes. Chronic right corona radiata lacunar insult. These results were called by telephone at the time of interpretation on 05/11/2020 at 8:29 pm to provider [facility] , who verbally acknowledged these results. Electronically Signed   By: Stana Bunting M.D.   On: 05/11/2020 20:31   DG CHEST PORT 1 VIEW  Result Date: 05/12/2020 CLINICAL DATA:  Intubation EXAM: PORTABLE CHEST 1 VIEW COMPARISON:  Radiograph 05/11/2020 FINDINGS: Endotracheal tube tip terminates low in the trachea, 1.5 cm from the carina. Consider retracting 2 cm to the mid trachea. Transesophageal tube side port is just distal to the GE junction with the tip below the margins of imaging. Telemetry leads and external support devices overlie the chest. Prior sternotomy. Stable cardiomediastinal contours accounting for differences in technique. Calcified, tortuous aorta. Some hazy and streaky opacities in the lungs are favored to reflect atelectatic change. No pneumothorax. No effusion. No acute osseous or soft tissue abnormality. Degenerative changes are present in the imaged spine and shoulders.  IMPRESSION: 1. Endotracheal tube tip terminates low in the trachea, 1.5 cm from the carina. Consider retracting 2 cm to the mid trachea. 2. Transesophageal tube side port is just distal to the GE junction with the tip below the margins of imaging. 3. Hazy and streaky opacities in the lungs favored to reflect atelectasis. These results will be called to the ordering clinician or representative by the Radiologist Assistant, and communication documented in the PACS or [employer]. Electronically Signed   By: Kreg Shropshire M.D.   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 Cardiac silhouette is unremarkable. No airspace disease, effusion, or pneumothorax. Prior healed bilateral rib fractures. IMPRESSION: 1. No acute intrathoracic process. Electronically Signed   By: Sharlet Salina M.D.   On: 05/11/2020 20:29    ROS per HPI Blood pressure (!) 182/108, pulse (!) 117, temperature 99.5 F (37.5 C), temperature source Oral, resp. rate 17, height  (1.778 m), weight 100 kg, SpO2 99 %.  Physical Exam: Patient has been recently intubated. He is unresponsive and unable to follow commands most likely secondary to RSI drugs. He appears to have a left gaze preference.  Withdrawals to noxious stimuli.   Assessment/Plan: 70 y.o. 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 His neurological status continued to decline and was emergently intubated. EVD placed due to worsening neurological status and HCP. Maintain EVD at 10 Cm H20. Frequent neuro checks. Maintain SBP < 140.  Council Mechanic, DNP, NP-C 05/12/2020, 5:30 AM

## 2020-05-12 NOTE — Progress Notes (Signed)
STROKE TEAM PROGRESS NOTE   INTERVAL HISTORY His RN is at the bedside.  Pt still intubated with EVD which did not drain much. Pt is on sedation but able to open eyes on voice and moving all extremities and following simple commands.   Vitals:   05/12/20 1000 05/12/20 1100 05/12/20 1116 05/12/20 1200  BP: (!) 92/55 (!) 94/58  (!) 133/99  Pulse: 61 78  83  Resp: 19 12  (!) 24  Temp:      TempSrc:      SpO2: 100% 99% 100% 100%  Weight:      Height:       CBC:  Recent Labs  Lab 05/11/20 1936 05/12/20 0413 05/12/20 0415  WBC 9.2  --  9.6  NEUTROABS 7.8*  --  7.7  HGB 17.2* 14.6 15.0  HCT 52.1* 43.0 46.3  MCV 86.3  --  85.9  PLT 163  --  161   Basic Metabolic Panel:  Recent Labs  Lab 05/11/20 1936 05/12/20 0413 05/12/20 0415 05/12/20 1053  NA 139 138 138  --   K 3.4* 3.4* 3.5  --   CL 100  --  102  --   CO2 28  --  24  --   GLUCOSE 166*  --  206*  --   BUN 8  --  10  --   CREATININE 0.69  --  1.08  --   CALCIUM 9.3  --  8.8*  --   MG  --   --   --  1.9   Lipid Panel:  Recent Labs  Lab 05/11/20 2348  CHOL 141  TRIG 54  HDL 46  CHOLHDL 3.1  VLDL 11  LDLCALC 84   HgbA1c:  Recent Labs  Lab 05/11/20 2340  HGBA1C 9.4*   Urine Drug Screen:  Recent Labs  Lab 05/12/20 0432  LABOPIA NONE DETECTED  COCAINSCRNUR NONE DETECTED  LABBENZ NONE DETECTED  AMPHETMU NONE DETECTED  THCU NONE DETECTED  LABBARB NONE DETECTED    Alcohol Level No results for input(s): ETH in the last 168 hours.  IMAGING past 24 hours DG Abd 1 View  Result Date: 05/12/2020 CLINICAL DATA:  Orogastric tube placement EXAM: ABDOMEN - 1 VIEW COMPARISON:  CT abdomen and pelvis February 26, 2020 FINDINGS: Orogastric tube tip and side port in stomach. Visualized bowel appears unremarkable without bowel dilatation or air-fluid level to suggest bowel obstruction. No free air evident on supine examination. IMPRESSION: Orogastric tube tip and side port in stomach. Visualized bowel appears  unremarkable on supine examination. Electronically Signed   By: Bretta Bang III M.D.   On: 05/12/2020 09:31   CT HEAD WO CONTRAST  Result Date: 05/12/2020 CLINICAL DATA:  Follow-up intracranial hemorrhage EXAM: CT HEAD WITHOUT CONTRAST TECHNIQUE: Contiguous axial images were obtained from the base of the skull through the vertex without intravenous contrast. COMPARISON:  05/11/2020 FINDINGS: Brain: Medial left thalamic intraparenchymal hemorrhage appears the same, approximately 1 cm in size. Extension into the ventricular system with the amount of intraventricular blood being very similar. Filling of the fourth ventricle. Dependent blood in both occipital horns. Filling of the third ventricle. Areas of clot in both lateral ventricles. Ventriculomegaly appears the same without further dilatation. Elsewhere, chronic small-vessel ischemic changes are present throughout the white matter. Vascular: There is atherosclerotic calcification of the major vessels at the base of the brain. Skull: Negative Sinuses/Orbits: Fluid level in the sphenoid sinus consistent with rhinosinusitis. Orbits negative Other: None IMPRESSION: 1.  Medial left thalamic intraparenchymal hemorrhage appears the same, approximately 1 cm in size. Extension into the ventricular system with the amount of intraventricular blood being very similar. 2. Ventriculomegaly appears the same without further dilatation. 3. Chronic small-vessel ischemic changes of the white matter. 4. Fluid level in the sphenoid sinus consistent with rhinosinusitis. Electronically Signed   By: Paulina FusiMark  Shogry M.D.   On: 05/12/2020 02:26   CT HEAD WO CONTRAST  Result Date: 05/11/2020 CLINICAL DATA:  Mental status change, unknown cause EXAM: CT HEAD WITHOUT CONTRAST TECHNIQUE: Contiguous axial images were obtained from the base of the skull through the vertex without intravenous contrast. COMPARISON:  02/26/2020 and prior FINDINGS: Brain: Acute left thalamic hemorrhage  measuring approximately 1.2 x 0.8 x 0.8 cm. Intraventricular extension involving the lateral, third and fourth ventricles. No midline shift. No ventriculomegaly. Mild cerebral atrophy with ex vacuo dilatation. Chronic microvascular ischemic changes. No mass lesion. Chronic right corona radiata lacunar insult. Vascular: No hyperdense vessel or unexpected calcification. Bilateral skull base atherosclerotic calcifications. Skull: Negative for fracture or focal lesion. Sinuses/Orbits: Normal orbits. Right sphenoid sinus layering secretions. No mastoid effusion. Other: White please note image quality is degraded by motion artifact. 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 Mild cerebral atrophy and chronic microvascular ischemic changes. Chronic right corona radiata lacunar insult. These results were called by telephone at the time of interpretation on 05/11/2020 at 8:29 pm to provider [facility] , who verbally acknowledged these results. Electronically Signed   By: Stana Buntinghikanele  Emekauwa M.D.   On: 05/11/2020 20:31   DG Chest Port 1 View  Result Date: 05/12/2020 CLINICAL DATA:  Adjusted ET and OG tubes EXAM: PORTABLE CHEST 1 VIEW COMPARISON:  Radiograph 05/12/2020 FINDINGS: Endotracheal tube tip terminates in the mid trachea 4.4 cm from the carina. Transesophageal tube has been advanced now with the side port positioned beyond the GE junction and tip terminating near the gastric body. Stable postsurgical changes related to prior sternotomy and CABG. Cardiomediastinal contours are unremarkable. Some persistent streaky opacities favoring atelectasis. No new consolidative opacities or sizable pleural effusions. No acute osseous or soft tissue abnormality. Degenerative changes are present in the imaged spine and shoulders. Telemetry leads overlie the chest. IMPRESSION: 1. Retraction of the endotracheal tube, now appropriately positioned in the mid trachea. 2. Interval advancement of  transesophageal tube, tip now well positioned at the gastric body. 3. Persistent streaky opacities favoring atelectasis. Electronically Signed   By: Kreg ShropshirePrice  DeHay M.D.   On: 05/12/2020 06:45   DG CHEST PORT 1 VIEW  Result Date: 05/12/2020 CLINICAL DATA:  Intubation EXAM: PORTABLE CHEST 1 VIEW COMPARISON:  Radiograph 05/11/2020 FINDINGS: Endotracheal tube tip terminates low in the trachea, 1.5 cm from the carina. Consider retracting 2 cm to the mid trachea. Transesophageal tube side port is just distal to the GE junction with the tip below the margins of imaging. Telemetry leads and external support devices overlie the chest. Prior sternotomy. Stable cardiomediastinal contours accounting for differences in technique. Calcified, tortuous aorta. Some hazy and streaky opacities in the lungs are favored to reflect atelectatic change. No pneumothorax. No effusion. No acute osseous or soft tissue abnormality. Degenerative changes are present in the imaged spine and shoulders. IMPRESSION: 1. Endotracheal tube tip terminates low in the trachea, 1.5 cm from the carina. Consider retracting 2 cm to the mid trachea. 2. Transesophageal tube side port is just distal to the GE junction with the tip below the margins of imaging. 3. Hazy and streaky opacities in  the lungs favored to reflect atelectasis. These results will be called to the ordering clinician or representative by the Radiologist Assistant, and communication documented in the PACS or [employer]. Electronically Signed   By: Kreg Shropshire M.D.   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 Cardiac silhouette is unremarkable. No airspace disease, effusion, or pneumothorax. Prior healed bilateral rib fractures. IMPRESSION: 1. No acute intrathoracic  process. Electronically Signed   By: Sharlet Salina M.D.   On: 05/11/2020 20:29    PHYSICAL EXAM  Temp:  [98.6 F (37 C)-100.6 F (38.1 C)] 99.1 F (37.3 C) (02/25 0800) Pulse Rate:  [42-123] 83 (02/25 1200) Resp:  [12-31] 24 (02/25 1200) BP: (67-205)/(48-136) 133/99 (02/25 1200) SpO2:  [89 %-100 %] 100 % (02/25 1200) FiO2 (%):  [30 %] 30 % (02/25 1116) Weight:  [100 kg] 100 kg (02/24 1905)  General - Well nourished, well developed, intubated on sedation.  Ophthalmologic - fundi not visualized due to noncooperation.  Cardiovascular - irregularly irregular heart rate and rhythm.  Neuro - intubated on sedation, eyes open on voice, following simple commands. With eye opening, eyes in mid position, blinking to visual threat, doll's eyes present, tracking bilaterally, PERRL. Corneal reflex present, gag and cough present. Breathing over the vent.  Facial symmetry not able to test due to ET tube.  Tongue protrusion not cooperative. BUE 3/5 and symmetrical finger grip, however RLE 3-/5 and LLE 4/5. DTR 1+ and no babinski. Sensation, coordination and gait not tested.   ASSESSMENT/PLAN [name] is a 70 y.o. male with history of HTN, DM, CAD/MI, afib on eliquis presenting with AMS, garbled speech, N/V and behavior change.   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   Therapy recommendations:  pending  Disposition:  Pending   Hypertensive emergency  Home meds:  Lotensin, metoprolol  Stable . Off cleviprex . BP goal < 160 . Long-term BP goal normotensive  Afib on AC  Was on eliquis PTA  Reversed by Kcentra  Now on no antithrombotics due to ICH  Respiratory failure  Intubated   On vent  CCM on  board  Extubate as able  Hyperlipidemia  Home meds:  crestor 20  LDL 84, goal < 70  May continue statin at discharge  Diabetes type II Uncontrolled  Home meds:  insulin  HgbA1c 9.4, goal < 7.0  CBGs  SSI  Close PCP follow up  Other Stroke Risk Factors  Advanced Age >/= 40   Hx stroke/TIA  Coronary artery disease and MI  Other Active Problems  none  Hospital day # 1  This patient is critically ill due to ICH with IVH, s/p intubation and EVD, hypertensive emergency and at significant risk of neurological worsening, death form hematoma expansion, hydrocephalus, heart failure, hypertensive encephalopathy, seizure. This patient's care requires constant monitoring of vital signs, hemodynamics, respiratory and cardiac monitoring, review of multiple databases, neurological assessment, discussion with family, other specialists and medical decision making of high complexity.  I spent 35 minutes of neurocritical care time in the care of this patient. I discussed with Dr. Venetia Maxon.  Marvel Plan, MD PhD Stroke Neurology 05/12/2020 12:34 PM   To contact Stroke Continuity provider, please refer to [email or web address]. After hours, contact General Neurology

## 2020-05-12 NOTE — Progress Notes (Signed)
Transitions of Care Team following this patient for discharge planning. Patient comes from [facility]. Currently, patient not medically stable; will continue to follow as patient progresses.    Zaray Gatchel LCSW

## 2020-05-12 NOTE — Consult Note (Addendum)
NAME:  Jason [name], MRN:  [id number], DOB:  11-17-1950, LOS: 1 ADMISSION DATE:  05/11/2020, CONSULTATION DATE:  2/25 REFERRING MD:  Dr. Iver Nestle Neurology, CHIEF COMPLAINT:  AMS  Brief History:  70 year old male admitted with hemorrhagic stroke ultimately requiring intubation for airway protection and EVD placed by neurosurgery.   History of Present Illness:  Patient is encephalopathic and/or intubated. Therefore history has been obtained from chart review.   70 year old male with PMH as below, which is significant for CAD, DM, HTN, PAF on AC, and CVA. He was last known well 2/23, and 2/24 in the morning he was found to be confused "talking out of his head". EMS was apparently called, but he was not initially transported to the emergency department for some reason. His mental status worsened and EMS was called again, this time transporting the patient to [facility] ED. In the ED he was noted to be febrile prompting an infectious workup. CT of the head showed ICH with intraventricular extension with concern for hydrocephalus. SBP on presentation 205. Theodoro Parma was given for Eliquis reversal. He was admitted by neurology to ICU where unfortunately his mental status continued to decline. PCCM was consulted for intubation at the same time neurology was consulted for ventriculostomy placement.   Past Medical History:    has a past medical history of Anxiety, Coronary artery disease, Depression, Diabetes mellitus without complication (HCC), Dysrhythmia, Hypertension, Myocardial infarction [facility]), and Stroke (HCC).   Significant Hospital Events:  2/24 admitted for [facility] 2/25 Intubated, EVD placed.   Consults:  Neurosurgery  Procedures:  ETT 2/25 > EVD 2/25 >  Significant Diagnostic Tests:  CT head 2/24 > 1.2cm thalamic hemorrhage with intraventricular extension. Chronic right corona radiata lacunar insult.  CT head 2/25 > Medial left thalamic intraparenchymal hemorrhage appears the same,  approximately 1 cm in size. Extension into the ventricular system with the amount of intraventricular blood being very similar. Ventriculomegaly appears the same without further dilatation.  Micro Data:  Blood 2/24 > Urine 2/24 >  Antimicrobials:     Interim History / Subjective:  T max 100.6, WBC 9.6 HGB 14 Na 138, K 3.5, Creatinine 1.08 ETT pulled back 2 cm after intubation CXR 2/25>> reviewed by me Persistent streaky opacities favoring atelectasis ETT in good position Net + 434 ( 175 cc UO) 4 cc per IVC per I&O, but picking up this morning.  BP is soft with continued low UO. 500 cc bolus of LR 2/25 Has converted into a-flutter, rate of 60-80 Precedex 0.7  Objective   Blood pressure (!) 92/55, pulse 61, temperature 99.5 F (37.5 C), temperature source Oral, resp. rate 19, height 5\' 10"  (1.778 m), weight 100 kg, SpO2 100 %.    Vent Mode: PRVC FiO2 (%):  [30 %] 30 % Set Rate:  [12 bmp-15 bmp] 12 bmp Vt Set:  [580 mL] 580 mL PEEP:  [5 cmH20] 5 cmH20 Plateau Pressure:  [14 cmH20] 14 cmH20   Intake/Output Summary (Last 24 hours) at 05/12/2020 1024 Last data filed at 05/12/2020 0900 Gross per 24 hour  Intake 631.75 ml  Output 213 ml  Net 418.75 ml   Filed Weights   05/11/20 1905  Weight: 100 kg    Examination: General: elderly male in no acute distress, sedated and intubated HENT: /AT, no JVD Lungs: Bilateral chest excursion, Clear bilateral breath sounds, diminished per bases Cardiovascular:Irr, atrial flutter per tele, no MRG Abdomen: Soft, non-tender, non-distended, BS + Extremities: No acute deformity, cool  to touch Neuro:Sedated on precedex,  Somnolent, downward gaze preference.    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 Likely hypertensive in nature. Anticoagulated on Eliquis, KCentra given in ED.  - Neurology primary - Appreciate Neurosurgery assist  for EVD  placement.  - MRI pending.  - Keep SBP < 140mmHg - Neuro checks - Echo completed 2/25, not yet read - Carotid dopplers  Hypertensive emergency>> BP soft 2/25 - Clevidipine infusion for SBP goal < 140 - Holding home metoprolol, benazepril for now - Currently needing phenylephrine for post intubation hypotension.   Acute hypoxemic respiratory failure: secondary to IPH and failure to protect airway.  Plan Continue ventilator support with lung protective strategies  Wean PEEP and FiO2 for sats greater than 90%. Head of bed elevated 30 degrees. Plateau pressures less than 30 cm H20.  Follow intermittent chest x-ray and ABG.   SAT/SBT as tolerated, mentation preclude extubation  Ensure adequate pulmonary hygiene  Follow cultures  VAP bundle in place  PAD protocol CXR prn ABG prn RASS goal 0 to -1.   Atrial fibrillation on Eliquis Converted into atrial flutter 2/25, rate 60-80 CAD - Telemetry monitoring - Hold Eliquis - Will consult cardiology  DM2: Hgb A1C 9.4 CBG's consistently > 200 - CBG monitoring and SSI - Holding home latnus - Consult to Diabetic Co-ordinator - Increased CBG coverage and added TF coverage  Hypokalemia - supplement again 2/25 - Check mag and replete as needed - Trend  BMP  QTC prolongation - Avoid QT prolonging meds - Repeat EKG.   Best practice (evaluated daily)  Diet: NPO, with transition to Tube feeds Pain/Anxiety/Delirium protocol (if indicated): RASS goal 0 to -1 VAP protocol (if indicated): Per protocol DVT prophylaxis: SCD GI prophylaxis: PPI Glucose control: SSI Mobility: BR Disposition:FULL  Goals of Care:  Last date of multidisciplinary goals of care discussion: Family and staff present:  Summary of discussion:  Follow up goals of care discussion due: 3/4 Code Status: FULL  Labs   CBC: Recent Labs  Lab 05/11/20 1936 05/12/20 0413 05/12/20 0415  WBC 9.2  --  9.6  NEUTROABS 7.8*  --  7.7  HGB 17.2* 14.6 15.0  HCT  52.1* 43.0 46.3  MCV 86.3  --  85.9  PLT 163  --  161    Basic Metabolic Panel: Recent Labs  Lab 05/11/20 1936 05/12/20 0413 05/12/20 0415  NA 139 138 138  K 3.4* 3.4* 3.5  CL 100  --  102  CO2 28  --  24  GLUCOSE 166*  --  206*  BUN 8  --  10  CREATININE 0.69  --  1.08  CALCIUM 9.3  --  8.8*   GFR: Estimated Creatinine Clearance: 74.4 mL/min (by C-G formula based on SCr of 1.08 mg/dL). Recent Labs  Lab 05/11/20 1936 05/12/20 0415  WBC 9.2 9.6  LATICACIDVEN 1.6  --     Liver Function Tests: Recent Labs  Lab 05/11/20 1936 05/12/20 0415  AST 29 25  ALT 30 25  ALKPHOS 144* 113  BILITOT 0.8 1.1  PROT 8.1 6.3*  ALBUMIN 4.4 3.2*   No results for input(s): LIPASE, AMYLASE in the last 168 hours. Recent Labs  Lab 05/11/20 1936  AMMONIA 13    ABG    Component Value Date/Time   PHART 7.477 (H) 05/12/2020 0413   PCO2ART 34.1 05/12/2020 0413   PO2ART 113 (H) 05/12/2020 0413  HCO3 25.1 05/12/2020 0413   TCO2 26 05/12/2020 0413   ACIDBASEDEF 15.0 (H) 02/26/2020 1450   O2SAT 99.0 05/12/2020 0413     Coagulation Profile: Recent Labs  Lab 05/11/20 2018  INR 1.1    Cardiac Enzymes: No results for input(s): CKTOTAL, CKMB, CKMBINDEX, TROPONINI in the last 168 hours.  HbA1C: Hgb A1c MFr Bld  Date/Time Value Ref Range Status  05/11/2020 11:40 PM 9.4 (H) 4.8 - 5.6 % Final    Comment:    (NOTE) Pre diabetes:          5.7%-6.4%  Diabetes:              >6.4%  Glycemic control for   <7.0% adults with diabetes   02/26/2020 07:43 PM 12.8 (H) 4.8 - 5.6 % Final    Comment:    (NOTE) Pre diabetes:          5.7%-6.4%  Diabetes:              >6.4%  Glycemic control for   <7.0% adults with diabetes     CBG: Recent Labs  Lab 05/11/20 1944 05/12/20 0003 05/12/20 0248 05/12/20 0403 05/12/20 0816  GLUCAP 153* 240* 241* 201* 245*    Review of Systems:     Past Medical History:  He,  has a past medical history of Anxiety, Coronary artery disease,  Depression, Diabetes mellitus without complication (HCC), Dysrhythmia, Hypertension, Myocardial infarction (HCC), and Stroke (HCC).   Surgical History:   Past Surgical History:  Procedure Laterality Date  . CORONARY ARTERY BYPASS GRAFT       Social History:   reports that he has never smoked. He has never used smokeless tobacco. He reports previous alcohol use. He reports previous drug use.   Family History:  His family history is not on file.   Allergies No Known Allergies   Home Medications  Prior to Admission medications   Medication Sig Start Date End Date Taking? Authorizing Provider  acetaminophen (TYLENOL) 650 MG CR tablet Take 650 mg by mouth every 8 (eight) hours as needed for pain.   Yes [provider]  benazepril (LOTENSIN) 40 MG tablet Take 1 tablet (40 mg total) by mouth daily. 03/08/20 05/07/20 Yes Steffanie Rainwater, MD  buPROPion (WELLBUTRIN) 75 MG tablet Take 1 tablet (75 mg total) by mouth daily. 03/09/20 05/08/20 Yes Amponsah, Flossie Buffy, MD  docusate sodium (COLACE) 100 MG capsule Take 100 mg by mouth daily.   Yes [provider]  doxycycline (VIBRAMYCIN) 100 MG capsule Take 100 mg by mouth 2 (two) times daily. Start date: 05/09/20   Yes [provider]  ELIQUIS 5 MG TABS tablet Take 1 tablet (5 mg total) by mouth 2 (two) times daily. 03/08/20 05/07/20 Yes Amponsah, Flossie Buffy, MD  Insulin Glargine [facility] KWIKPEN) 100 UNIT/ML Inject 35 Units into the skin daily. ON Hold per [facility] Patient taking differently: Inject 20 Units into the skin at bedtime. 03/08/20  Yes Amponsah, Flossie Buffy, MD  insulin lispro (HUMALOG) 100 UNIT/ML KwikPen Inject 2-12 Units into the skin 3 (three) times daily. Sliding scale:   0-150 = 2 units 151-200 = 4 units 201-250 = 6 units 251-300 = 8 units 301-350 = 10 units 351-400 = 12 units   Yes [provider]  melatonin 3 MG TABS tablet Take 3 mg by mouth at bedtime.   Yes [provider]   metoprolol tartrate (LOPRESSOR) 50 MG tablet Take 1 tablet (50 mg total) by mouth 2 (  two) times daily. 03/08/20 04/07/20 Yes Steffanie Rainwater, MD  ondansetron (ZOFRAN) 4 MG tablet Take 4 mg by mouth every 8 (eight) hours as needed for nausea or vomiting.   Yes [provider]  polyethylene glycol (MIRALAX / GLYCOLAX) 17 g packet Take 17 g by mouth daily.   Yes [provider]  rosuvastatin (CRESTOR) 20 MG tablet Take 1 tablet (20 mg total) by mouth daily. 03/09/20 04/08/20 Yes Steffanie Rainwater, MD  aspirin EC 81 MG tablet Take 1 tablet (81 mg total) by mouth daily. Patient not taking: Reported on 05/11/2020 03/08/20   Steffanie Rainwater, MD  insulin lispro (HUMALOG) 100 UNIT/ML injection Inject 0.02-0.12 mLs (2-12 Units total) into the skin 3 (three) times daily with meals. Based on Sliding scale Patient not taking: Reported on 05/11/2020 03/08/20   Steffanie Rainwater, MD  lidocaine (LIDODERM) 5 % Place 2 patches onto the skin daily. Remove & Discard patch within 12 hours or as directed by MD Patient not taking: Reported on 05/11/2020 03/09/20   Steffanie Rainwater, MD     Critical care time: 52 mins     Bevelyn Ngo, MSN, AGACNP-BC  Pulmonary/Critical Care Medicine See Amion for personal pager PCCM on call pager [phone] See Amion for personal pager PCCM on call pager [phone] until 7pm. Please call Elink 7p-7a. [phone]  05/12/2020 10:24 AM

## 2020-05-12 NOTE — Progress Notes (Addendum)
Inpatient Diabetes Program Recommendations  AACE/ADA: New Consensus Statement on Inpatient Glycemic Control (2015)  Target Ranges:  Prepandial:   less than 140 mg/dL      Peak postprandial:   less than 180 mg/dL (1-2 hours)      Critically ill patients:  140 - 180 mg/dL   Lab Results  Component Value Date   GLUCAP 245 (H) 05/12/2020   HGBA1C 9.4 (H) 05/11/2020    Review of Glycemic Control Results for Jason [name], Jason [name] (MRN [id number]) as of 05/12/2020 10:47  Ref. Range 05/12/2020 00:03 05/12/2020 02:48 05/12/2020 04:03 05/12/2020 08:16  Glucose-Capillary Latest Ref Range: 70 - 99 mg/dL 536 (H) 144 (H) 315 (H) 245 (H)   Diabetes history: DM2  Outpatient Diabetes medications: Basaglar 20 units QHS, Humalog 2-12 units TID  Current orders for Inpatient glycemic control: Novolog 0-15 units Q4H  Inpatient Diabetes Program Recommendations:    Please consider Lantus 15 units daily   Addendum @ 1235:  Referral for hyperglycemia and starting tube feeds.  Please consider, Novolog 4 units Q4H-stop if feeds are held or discontinued.    Will continue to follow while inpatient.  Thank you, Dulce Sellar, RN, BSN Diabetes Coordinator Inpatient Diabetes Program [phone] (team pager from 8a-5p)

## 2020-05-12 NOTE — Progress Notes (Signed)
OETT retracted approximately 1.5cms in order to place at position when intubated (24 teeth, 25 lip).  OETT @ 26 during xray

## 2020-05-12 NOTE — Progress Notes (Signed)
  Echocardiogram 2D Echocardiogram has been performed.  Jason [name] 05/12/2020, 10:45 AM

## 2020-05-12 NOTE — Progress Notes (Signed)
SLP Cancellation Note  Patient Details Name: Jason [name] MRN: [id number] DOB: 1950-05-10   Cancelled treatment:         Pt intubated today for procedure. Will follow and initiate assessments when able.    Royce Macadamia 05/12/2020, 10:08 AM   Breck Coons Lonell Face.Ed Nurse, children's [phone] Office [phone]

## 2020-05-12 NOTE — Consult Note (Signed)
NAME:  Jason [name], MRN:  [id number], DOB:  06-22-1950, LOS: 1 ADMISSION DATE:  05/11/2020, CONSULTATION DATE:  2/25 REFERRING MD:  Dr. Iver Nestle Neurology, CHIEF COMPLAINT:  AMS  Brief History:  70 year old male admitted with hemorrhagic stroke ultimately requiring intubation for airway protection and EVD placed by neurosurgery.   History of Present Illness:  Patient is encephalopathic and/or intubated. Therefore history has been obtained from chart review.   70 year old male with PMH as below, which is significant for CAD, DM, HTN, PAF on AC, and CVA. He was last known well 2/23, and 2/24 in the morning he was found to be confused "talking out of his head". EMS was apparently called, but he was not initially transported to the emergency department for some reason. His mental status worsened and EMS was called again, this time transporting the patient to [facility] ED. In the ED he was noted to be febrile prompting an infectious workup. CT of the head showed ICH with intraventricular extension with concern for hydrocephalus. SBP on presentation 205. Theodoro Parma was given for Eliquis reversal. He was admitted by neurology to ICU where unfortunately his mental status continued to decline. PCCM was consulted for intubation at the same time neurology was consulted for ventriculostomy placement.   Past Medical History:    has a past medical history of Anxiety, Coronary artery disease, Depression, Diabetes mellitus without complication (HCC), Dysrhythmia, Hypertension, Myocardial infarction [facility]), and Stroke (HCC).   Significant Hospital Events:  2/24 admitted for [facility] 2/25 Intubated, EVD placed.   Consults:  Neurosurgery  Procedures:  ETT 2/25 > EVD 2/25 >  Significant Diagnostic Tests:  CT head 2/24 > 1.2cm thalamic hemorrhage with intraventricular extension. Chronic right corona radiata lacunar insult.  CT head 2/25 > Medial left thalamic intraparenchymal hemorrhage appears the same,  approximately 1 cm in size. Extension into the ventricular system with the amount of intraventricular blood being very similar. Ventriculomegaly appears the same without further dilatation.  Micro Data:  Blood 2/24 > Urine 2/24 >  Antimicrobials:     Interim History / Subjective:    Objective   Blood pressure (!) 86/52, pulse 94, temperature 98.6 F (37 C), temperature source Oral, resp. rate (!) 24, height 5\' 10"  (1.778 m), weight 100 kg, SpO2 94 %.        Intake/Output Summary (Last 24 hours) at 05/12/2020 0326 Last data filed at 05/12/2020 0000 Gross per 24 hour  Intake 204.03 ml  Output --  Net 204.03 ml   Filed Weights   05/11/20 1905  Weight: 100 kg    Examination: General: elderly male in no acute distress HENT: [place]/AT, no JVD Lungs: Clear bilateral breath sounds Cardiovascular: RRR, no MRG Abdomen: Soft, non-tender, non-distnded Extremities: No acute deformity Neuro: Somnolent, downward gaze preference.    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 Likely hypertensive in nature. Anticoagulated on Eliquis, KCentra given in ED.  - Neurology primary - Neurosurgery consulted for EVD placement.  - MRI pending.  - Keep SBP < 05/13/20 - Neuro checks - Echo - Carotid dopplers  Hypertensive emergency - Clevidipine infusion for SBP goal < 140 - Holding home metoprolol, benazepril for now - Currently needing phenylephrine for post intubation hypotension.   Acute hypoxemic respiratory failure: secondary to IPH and failure to protect airway.  - STAT intubation - Full vent support - CXR - ABG - VAP bundle - Daily SAT/SBT -  RASS goal 0 to -1.   Atrial fibrillation on Eliquis CAD - Telemetry monitoring - Hold Eliquis  DM2: Hgb A1C 9.4 - CBG monitoring and SSI - Holding home latnus  Hypokalemia - supplement - repeat BMP  QTC prolongation - Avoid QT prolonging  meds - Repeat EKG.   Best practice (evaluated daily)  Diet: NPO Pain/Anxiety/Delirium protocol (if indicated): RASS goal 0 to -1 VAP protocol (if indicated): Per protocol DVT prophylaxis: SCD GI prophylaxis: PPI Glucose control: SSI Mobility: BR Disposition:FULL  Goals of Care:  Last date of multidisciplinary goals of care discussion: Family and staff present:  Summary of discussion:  Follow up goals of care discussion due: 3/4 Code Status: FULL  Labs   CBC: Recent Labs  Lab 05/11/20 1936  WBC 9.2  NEUTROABS 7.8*  HGB 17.2*  HCT 52.1*  MCV 86.3  PLT 163    Basic Metabolic Panel: Recent Labs  Lab 05/11/20 1936  NA 139  K 3.4*  CL 100  CO2 28  GLUCOSE 166*  BUN 8  CREATININE 0.69  CALCIUM 9.3   GFR: Estimated Creatinine Clearance: 100.4 mL/min (by C-G formula based on SCr of 0.69 mg/dL). Recent Labs  Lab 05/11/20 1936  WBC 9.2  LATICACIDVEN 1.6    Liver Function Tests: Recent Labs  Lab 05/11/20 1936  AST 29  ALT 30  ALKPHOS 144*  BILITOT 0.8  PROT 8.1  ALBUMIN 4.4   No results for input(s): LIPASE, AMYLASE in the last 168 hours. Recent Labs  Lab 05/11/20 1936  AMMONIA 13    ABG    Component Value Date/Time   HCO3 13.1 (L) 02/26/2020 1450   TCO2 14 (L) 02/26/2020 1450   ACIDBASEDEF 15.0 (H) 02/26/2020 1450   O2SAT 69.0 02/26/2020 1450     Coagulation Profile: Recent Labs  Lab 05/11/20 2018  INR 1.1    Cardiac Enzymes: No results for input(s): CKTOTAL, CKMB, CKMBINDEX, TROPONINI in the last 168 hours.  HbA1C: Hgb A1c MFr Bld  Date/Time Value Ref Range Status  05/11/2020 11:40 PM 9.4 (H) 4.8 - 5.6 % Final    Comment:    (NOTE) Pre diabetes:          5.7%-6.4%  Diabetes:              >6.4%  Glycemic control for   <7.0% adults with diabetes   02/26/2020 07:43 PM 12.8 (H) 4.8 - 5.6 % Final    Comment:    (NOTE) Pre diabetes:          5.7%-6.4%  Diabetes:              >6.4%  Glycemic control for   <7.0% adults  with diabetes     CBG: Recent Labs  Lab 05/11/20 1944 05/12/20 0003 05/12/20 0248  GLUCAP 153* 240* 241*    Review of Systems:     Past Medical History:  He,  has a past medical history of Anxiety, Coronary artery disease, Depression, Diabetes mellitus without complication (HCC), Dysrhythmia, Hypertension, Myocardial infarction (HCC), and Stroke (HCC).   Surgical History:   Past Surgical History:  Procedure Laterality Date  . CORONARY ARTERY BYPASS GRAFT       Social History:   reports that he has never smoked. He has never used smokeless tobacco. He reports previous alcohol use. He reports previous drug use.   Family History:  His family history is not on file.   Allergies No Known Allergies   Home Medications  Prior to  Admission medications   Medication Sig Start Date End Date Taking? Authorizing Provider  acetaminophen (TYLENOL) 650 MG CR tablet Take 650 mg by mouth every 8 (eight) hours as needed for pain.   Yes [provider]  benazepril (LOTENSIN) 40 MG tablet Take 1 tablet (40 mg total) by mouth daily. 03/08/20 05/07/20 Yes Steffanie Rainwater, MD  buPROPion (WELLBUTRIN) 75 MG tablet Take 1 tablet (75 mg total) by mouth daily. 03/09/20 05/08/20 Yes Amponsah, Flossie Buffy, MD  docusate sodium (COLACE) 100 MG capsule Take 100 mg by mouth daily.   Yes [provider]  doxycycline (VIBRAMYCIN) 100 MG capsule Take 100 mg by mouth 2 (two) times daily. Start date: 05/09/20   Yes [provider]  ELIQUIS 5 MG TABS tablet Take 1 tablet (5 mg total) by mouth 2 (two) times daily. 03/08/20 05/07/20 Yes Amponsah, Flossie Buffy, MD  Insulin Glargine [facility] KWIKPEN) 100 UNIT/ML Inject 35 Units into the skin daily. ON Hold per [facility] Patient taking differently: Inject 20 Units into the skin at bedtime. 03/08/20  Yes Amponsah, Flossie Buffy, MD  insulin lispro (HUMALOG) 100 UNIT/ML KwikPen Inject 2-12 Units into the skin 3 (three) times daily. Sliding scale:   0-150  = 2 units 151-200 = 4 units 201-250 = 6 units 251-300 = 8 units 301-350 = 10 units 351-400 = 12 units   Yes [provider]  melatonin 3 MG TABS tablet Take 3 mg by mouth at bedtime.   Yes [provider]  metoprolol tartrate (LOPRESSOR) 50 MG tablet Take 1 tablet (50 mg total) by mouth 2 (two) times daily. 03/08/20 04/07/20 Yes Steffanie Rainwater, MD  ondansetron (ZOFRAN) 4 MG tablet Take 4 mg by mouth every 8 (eight) hours as needed for nausea or vomiting.   Yes [provider]  polyethylene glycol (MIRALAX / GLYCOLAX) 17 g packet Take 17 g by mouth daily.   Yes [provider]  rosuvastatin (CRESTOR) 20 MG tablet Take 1 tablet (20 mg total) by mouth daily. 03/09/20 04/08/20 Yes Steffanie Rainwater, MD  aspirin EC 81 MG tablet Take 1 tablet (81 mg total) by mouth daily. Patient not taking: Reported on 05/11/2020 03/08/20   Steffanie Rainwater, MD  insulin lispro (HUMALOG) 100 UNIT/ML injection Inject 0.02-0.12 mLs (2-12 Units total) into the skin 3 (three) times daily with meals. Based on Sliding scale Patient not taking: Reported on 05/11/2020 03/08/20   Steffanie Rainwater, MD  lidocaine (LIDODERM) 5 % Place 2 patches onto the skin daily. Remove & Discard patch within 12 hours or as directed by MD Patient not taking: Reported on 05/11/2020 03/09/20   Steffanie Rainwater, MD     Critical care time: 42 mins     Joneen Roach, AGACNP-BC [facility]  See Amion for personal pager PCCM on call pager [phone] until 7pm. Please call Elink 7p-7a. [phone]  05/12/2020 3:51 AM

## 2020-05-12 NOTE — Progress Notes (Signed)
Initial Nutrition Assessment  DOCUMENTATION CODES:   Not applicable  INTERVENTION:   Initiate tube feeding via OG tube: Osmolite 1.5 at 50 ml/h (1200 ml per day) Prosource TF 45 ml TID  Provides 1920 kcal, 108 gm protein, 912 ml free water daily    NUTRITION DIAGNOSIS:   Inadequate oral intake related to inability to eat as evidenced by NPO status.  GOAL:   Patient will meet greater than or equal to 90% of their needs  MONITOR:   TF tolerance  REASON FOR ASSESSMENT:   Consult,Ventilator Enteral/tube feeding initiation and management  ASSESSMENT:   Pt with PMH of anxiety, depression, DM, HTN, stroke, and MI admitted with ICH likely due to HTN.   2/25 s/p EVD and intubation   Pt discussed during ICU rounds and with RN.   Patient is currently intubated on ventilator support MV: 6.6 L/min Temp (24hrs), Avg:99 F (37.2 C), Min:97.3 F (36.3 C), Max:100.6 F (38.1 C)  Medications reviewed and include: novolog, SS, lantus, miralax, senokot-s   Precedex  Labs reviewed:  CBG's: 220-245   OG tip in stomach  EVD: 4 ml   Diet Order:   Diet Order            Diet NPO time specified  Diet effective now                 EDUCATION NEEDS:   No education needs have been identified at this time  Skin:  Skin Assessment: Reviewed RN Assessment  Last BM:  unknown  Height:   Ht Readings from Last 1 Encounters:  05/12/20 5\' 10"  (1.778 m)    Weight:   Wt Readings from Last 1 Encounters:  05/11/20 100 kg    Ideal Body Weight:     BMI:  Body mass index is 31.63 kg/m.  Estimated Nutritional Needs:   Kcal:  2000  Protein:  105-120 grams  Fluid:  >2 L/day  2001., RD, LDN, CNSC See AMiON for contact information

## 2020-05-12 NOTE — Plan of Care (Signed)
  Problem: Intracerebral Hemorrhage Tissue Perfusion: Goal: Complications of Intracerebral Hemorrhage will be minimized Outcome: Not Progressing

## 2020-05-12 NOTE — Procedures (Signed)
Intubation Procedure Note  Add Dinapoli  [id number]  Jul 24, 1950  Date:05/12/20  Time:4:51 AM   Provider Performing:Lizzete Gough B Kelwin Gibler    Procedure: Intubation (31500)  Indication(s) Respiratory Failure  Consent Unable to obtain consent due to emergent nature of procedure.   Anesthesia Etomidate, Versed, Fentanyl and Rocuronium   Time Out Verified patient identification, verified procedure, site/side was marked, verified correct patient position, special equipment/implants available, medications/allergies/relevant history reviewed, required imaging and test results available.   Sterile Technique Usual hand hygeine, masks, and gloves were used   Procedure Description Patient positioned in bed supine.  Sedation given as noted above.  Patient was intubated with endotracheal tube using Glidescope.  View was Grade 1 full glottis .  Number of attempts was 1.  Colorimetric CO2 detector was consistent with tracheal placement.   Complications/Tolerance None; patient tolerated the procedure well. Chest X-ray is ordered to verify placement.   EBL Minimal   Specimen(s) None

## 2020-05-12 NOTE — Consult Note (Signed)
Cardiology Consultation:   Patient ID: Jason [name] MRN: [id number]; DOB: 1951-02-28  Admit date: 05/11/2020 Date of Consult: 05/12/2020  PCP:  Jodi Marble, MD   [place]  Cardiologist:  No primary care provider on file. new Advanced Practice Provider:  No care team member to display Electrophysiologist:  None    Patient Profile:   Jason [name] is a 70 y.o. male with a hx of PAF since 10/2019 on eliquis , CAD with CABG, hx of stroke, Dm-2, recurrent falls, HTN   who is being seen today for the evaluation of atrial fib at the request of Dr Erlinda Hong after pt admitted 05/11/20 with confusion, BP elevation, + fever, and CT head with hemorrhage.  History of Present Illness:   Jason [name] with hx as above and PAF on eliquis with CHA2DS2VASc of 6 on lopressor 100 BID, was to follow up with EP.  No report of this.  CABG 22 years or so ago off ASA and plavix once placed on eliquis with hx of freq falls.  Other hx of anxiety depression.  In Dec 2021 pt admitted at [facility].  Admitted and Discharged in a flutter.    Pt was seen by his PCP and given ABX for unclear reason.  Then 05/11/20 pt was confused and febrile.  In ER with CT of head with  hemorrhage with intraventricular extension and possibly some mildly increased ventricular size (read as stable by radiology). He was reversed with Kcentra.  Pt underwent rt frontal IVC placement. Pt intubated on CCM is following along with Neuro and Neuro surgery.    EKG:  The EKG was personally reviewed and demonstrates:  05/11/20 atrial flutter HR 102 consider LVH, non specific T wave abnormalities follow up a flutter 122 no acute changes.. Telemetry:  Telemetry was personally reviewed and demonstrates: A-flutter with controlled V response,  HR is 75  Na 138  K+ 3.5 Cr 1.08   hgb 15 Wbc 9.6 plts 161  PCXR:  IMPRESSION: 1. Endotracheal tube tip terminates low in the trachea, 1.5 cm from the carina. Consider retracting 2 cm to the mid  trachea. 2. Transesophageal tube side port is just distal to the GE junction with the tip below the margins of imaging. 3. Hazy and streaky opacities in the lungs favored to reflect atelectasis.   Echo with EF 45-50% no RWMA RV is normal BP 119/72 P 60 R 12   Past Medical History:  Diagnosis Date  . Anxiety   . Coronary artery disease   . Depression   . Diabetes mellitus without complication ([place])   . Dysrhythmia   . Hypertension   . Myocardial infarction ([place])   . Stroke [facility])     Past Surgical History:  Procedure Laterality Date  . CORONARY ARTERY BYPASS GRAFT       Home Medications:  Prior to Admission medications   Medication Sig Start Date End Date Taking? Authorizing Provider  acetaminophen (TYLENOL) 650 MG CR tablet Take 650 mg by mouth every 8 (eight) hours as needed for pain.   Yes [provider]  benazepril (LOTENSIN) 40 MG tablet Take 1 tablet (40 mg total) by mouth daily. 03/08/20 05/07/20 Yes Lacinda Axon, MD  buPROPion (WELLBUTRIN) 75 MG tablet Take 1 tablet (75 mg total) by mouth daily. 03/09/20 05/08/20 Yes Amponsah, Charisse March, MD  docusate sodium (COLACE) 100 MG capsule Take 100 mg by mouth daily.   Yes [provider]  doxycycline (VIBRAMYCIN) 100 MG capsule  Take 100 mg by mouth 2 (two) times daily. Start date: 05/09/20   Yes [provider]  ELIQUIS 5 MG TABS tablet Take 1 tablet (5 mg total) by mouth 2 (two) times daily. 03/08/20 05/07/20 Yes Amponsah, Charisse March, MD  Insulin Glargine [facility] KWIKPEN) 100 UNIT/ML Inject 35 Units into the skin daily. ON Hold per [facility] Patient taking differently: Inject 20 Units into the skin at bedtime. 03/08/20  Yes Amponsah, Charisse March, MD  insulin lispro (HUMALOG) 100 UNIT/ML KwikPen Inject 2-12 Units into the skin 3 (three) times daily. Sliding scale:   0-150 = 2 units 151-200 = 4 units 201-250 = 6 units 251-300 = 8 units 301-350 = 10 units 351-400 = 12 units   Yes [provider]  melatonin 3 MG TABS tablet Take 3 mg by mouth at bedtime.   Yes [provider]  metoprolol tartrate (LOPRESSOR) 50 MG tablet Take 1 tablet (50 mg total) by mouth 2 (two) times daily. 03/08/20 04/07/20 Yes Lacinda Axon, MD  ondansetron (ZOFRAN) 4 MG tablet Take 4 mg by mouth every 8 (eight) hours as needed for nausea or vomiting.   Yes [provider]  polyethylene glycol (MIRALAX / GLYCOLAX) 17 g packet Take 17 g by mouth daily.   Yes [provider]  rosuvastatin (CRESTOR) 20 MG tablet Take 1 tablet (20 mg total) by mouth daily. 03/09/20 04/08/20 Yes Lacinda Axon, MD  aspirin EC 81 MG tablet Take 1 tablet (81 mg total) by mouth daily. Patient not taking: Reported on 05/11/2020 03/08/20   Lacinda Axon, MD  insulin lispro (HUMALOG) 100 UNIT/ML injection Inject 0.02-0.12 mLs (2-12 Units total) into the skin 3 (three) times daily with meals. Based on Sliding scale Patient not taking: Reported on 05/11/2020 03/08/20   Lacinda Axon, MD  lidocaine (LIDODERM) 5 % Place 2 patches onto the skin daily. Remove & Discard patch within 12 hours or as directed by MD Patient not taking: Reported on 05/11/2020 03/09/20   Lacinda Axon, MD    Inpatient Medications: Scheduled Meds: . chlorhexidine gluconate (MEDLINE KIT)  15 mL Mouth Rinse BID  . Chlorhexidine Gluconate Cloth  6 each Topical Daily  . feeding supplement (PROSource TF)  45 mL Per Tube TID  . insulin aspart  0-20 Units Subcutaneous Q4H  . insulin aspart  4 Units Subcutaneous Q4H  . insulin glargine  15 Units Subcutaneous Daily  . mouth rinse  15 mL Mouth Rinse 10 times per day  . pantoprazole (PROTONIX) IV  40 mg Intravenous QHS  . polyethylene glycol  17 g Per Tube Daily  . potassium chloride  40 mEq Per Tube Once  . senna-docusate  1 tablet Per Tube BID   Continuous Infusions: . sodium chloride 125 mL/hr at 05/12/20 0303  .  ceFAZolin (ANCEF) IV    . clevidipine Stopped  (05/12/20 1348)  . dexmedetomidine (PRECEDEX) IV infusion 1.2 mcg/kg/hr (05/12/20 1800)  . feeding supplement (OSMOLITE 1.5 CAL) 1,000 mL (05/12/20 1639)  . fentaNYL infusion INTRAVENOUS 100 mcg/hr (05/12/20 1800)  . phenylephrine (NEO-SYNEPHRINE) Adult infusion Stopped (05/12/20 0431)  . thiamine injection Stopped (05/12/20 1707)   PRN Meds: acetaminophen **OR** acetaminophen (TYLENOL) oral liquid 160 mg/5 mL **OR** acetaminophen, fentaNYL, fentaNYL (SUBLIMAZE) injection  Allergies:   No Known Allergies  Social History:   Social History   Socioeconomic History  . Marital status: Single    Spouse name: Not on file  . Number of children: 1  .  Years of education: Not on file  . Highest education level: Not on file  Occupational History  . Occupation: Retired  Tobacco Use  . Smoking status: Never Smoker  . Smokeless tobacco: Never Used  Vaping Use  . Vaping Use: Never used  Substance and Sexual Activity  . Alcohol use: Not Currently  . Drug use: Not Currently  . Sexual activity: Not Currently  Other Topics Concern  . Not on file  Social History Narrative   Patient states he lives alone and is having trouble taking care of residence.  States he has son who lives in [place] but does not know his phone number.    Social Determinants of Health   Financial Resource Strain: Not on file  Food Insecurity: Not on file  Transportation Needs: Not on file  Physical Activity: Not on file  Stress: Not on file  Social Connections: Not on file  Intimate Partner Violence: Not on file    Family History:   No family history on file.  Parents unknown medical hx  ROS:  Please see the history of present illness.  Pt on vent and sedated.  Information from H&P  General:no colds or fevers, no weight changes Skin:no rashes or ulcers HEENT:no blurred vision, no congestion CV:see HPI PUL:see HPI GI:no diarrhea constipation or melena, no indigestion GU:no hematuria, no dysuria MS:no  joint pain, no claudication Neuro:no syncope, no lightheadedness, hx stroke Endo:+ diabetes poorly controlled, no thyroid disease  All other ROS reviewed and negative.     Physical Exam/Data:   Vitals:   05/12/20 1545 05/12/20 1600 05/12/20 1700 05/12/20 1800  BP:  115/69 120/70 115/71  Pulse:  (!) 59 60 60  Resp:  $Remo'13 12 12  'OMktn$ Temp:  97.9 F (36.6 C)    TempSrc:  Axillary    SpO2: 100% 98% 99% 99%  Weight:      Height:        Intake/Output Summary (Last 24 hours) at 05/12/2020 1852 Last data filed at 05/12/2020 1800 Gross per 24 hour  Intake 1529.08 ml  Output 360 ml  Net 1169.08 ml   Last 3 Weights 05/11/2020 03/03/2020 03/02/2020  Weight (lbs) 220 lb 7.4 oz 219 lb 5.7 oz 215 lb 9.8 oz  Weight (kg) 100 kg 99.5 kg 97.8 kg     Body mass index is 31.63 kg/m.  General:  Middle age male, on a vent , intubated.  HEENT: normal Lymph: no adenopathy Neck: no JVD Endocrine:  No thryomegaly Vascular: No carotid bruits; FA pulses 2+ bilaterally without bruits  Cardiac:  RR  Lungs:   Vent  Abd: soft, nontender, no hepatomegaly  Ext: no edema Musculoskeletal:  No deformities, BUE and BLE strength normal and equal Skin: warm and dry  Neuro:  Unresponsive,  Unable to assess.  Psych:  Sedated, unable to assess     Relevant CV Studies: ECHO 05/12/20 IMPRESSIONS    1. Left ventricular ejection fraction, by estimation, is 45 to 50%. The  left ventricle has mildly decreased function. The left ventricle has no  regional wall motion abnormalities. Left ventricular diastolic parameters  are indeterminate.  2. Right ventricular systolic function is normal. The right ventricular  size is normal. There is normal pulmonary artery systolic pressure.  3. The mitral valve is normal in structure. No evidence of mitral valve  regurgitation. No evidence of mitral stenosis.  4. The aortic valve is normal in structure. There is mild calcification  of the aortic valve. There is  moderate  thickening of the aortic valve.  Aortic valve regurgitation is not visualized. Mild to moderate aortic  valve sclerosis/calcification is  present, without any evidence of aortic stenosis.  5. The inferior vena cava is normal in size with greater than 50%  respiratory variability, suggesting right atrial pressure of 3 mmHg.   Conclusion(s)/Recommendation(s): No intracardiac source of embolism  detected on this transthoracic study. A transesophageal echocardiogram is  recommended to exclude cardiac source of embolism if clinically indicated.   FINDINGS  Left Ventricle: Left ventricular ejection fraction, by estimation, is 45  to 50%. The left ventricle has mildly decreased function. The left  ventricle has no regional wall motion abnormalities. The left ventricular  internal cavity size was normal in  size. There is no left ventricular hypertrophy. Left ventricular diastolic  parameters are indeterminate.   Right Ventricle: The right ventricular size is normal. No increase in  right ventricular wall thickness. Right ventricular systolic function is  normal. There is normal pulmonary artery systolic pressure. The tricuspid  regurgitant velocity is 1.90 m/s, and  with an assumed right atrial pressure of 3 mmHg, the estimated right  ventricular systolic pressure is 84.1 mmHg.   Left Atrium: Left atrial size was normal in size.   Right Atrium: Right atrial size was normal in size.   Pericardium: There is no evidence of pericardial effusion.   Mitral Valve: The mitral valve is normal in structure. Mild mitral annular  calcification. No evidence of mitral valve regurgitation. No evidence of  mitral valve stenosis.   Tricuspid Valve: The tricuspid valve is normal in structure. Tricuspid  valve regurgitation is mild . No evidence of tricuspid stenosis.   Aortic Valve: The aortic valve is normal in structure. There is mild  calcification of the aortic valve. There is moderate  thickening of the  aortic valve. Aortic valve regurgitation is not visualized. Mild to  moderate aortic valve sclerosis/calcification  is present, without any evidence of aortic stenosis.   Pulmonic Valve: The pulmonic valve was normal in structure. Pulmonic valve  regurgitation is not visualized. No evidence of pulmonic stenosis.   Aorta: The aortic root is normal in size and structure.   Venous: The inferior vena cava is normal in size with greater than 50%  respiratory variability, suggesting right atrial pressure of 3 mmHg.   IAS/Shunts: No atrial level shunt detected by color flow Doppler.   Laboratory Data:  High Sensitivity Troponin:  No results for input(s): TROPONINIHS in the last 720 hours.   Chemistry Recent Labs  Lab 05/11/20 1936 05/12/20 0413 05/12/20 0415  NA 139 138 138  K 3.4* 3.4* 3.5  CL 100  --  102  CO2 28  --  24  GLUCOSE 166*  --  206*  BUN 8  --  10  CREATININE 0.69  --  1.08  CALCIUM 9.3  --  8.8*  GFRNONAA >60  --  >60  ANIONGAP 11  --  12    Recent Labs  Lab 05/11/20 1936 05/12/20 0415  PROT 8.1 6.3*  ALBUMIN 4.4 3.2*  AST 29 25  ALT 30 25  ALKPHOS 144* 113  BILITOT 0.8 1.1   Hematology Recent Labs  Lab 05/11/20 1936 05/12/20 0413 05/12/20 0415  WBC 9.2  --  9.6  RBC 6.04*  --  5.39  HGB 17.2* 14.6 15.0  HCT 52.1* 43.0 46.3  MCV 86.3  --  85.9  MCH 28.5  --  27.8  MCHC 33.0  --  32.4  RDW 13.7  --  13.7  PLT 163  --  161   BNPNo results for input(s): BNP, PROBNP in the last 168 hours.  DDimer No results for input(s): DDIMER in the last 168 hours.   Radiology/Studies:  DG Abd 1 View  Result Date: 05/12/2020 CLINICAL DATA:  Orogastric tube placement EXAM: ABDOMEN - 1 VIEW COMPARISON:  CT abdomen and pelvis February 26, 2020 FINDINGS: Orogastric tube tip and side port in stomach. Visualized bowel appears unremarkable without bowel dilatation or air-fluid level to suggest bowel obstruction. No free air evident on supine  examination. IMPRESSION: Orogastric tube tip and side port in stomach. Visualized bowel appears unremarkable on supine examination. Electronically Signed   By: Lowella Grip III M.D.   On: 05/12/2020 09:31   CT HEAD WO CONTRAST  Result Date: 05/12/2020 CLINICAL DATA:  Follow-up intracranial hemorrhage EXAM: CT HEAD WITHOUT CONTRAST TECHNIQUE: Contiguous axial images were obtained from the base of the skull through the vertex without intravenous contrast. COMPARISON:  05/11/2020 FINDINGS: Brain: Medial left thalamic intraparenchymal hemorrhage appears the same, approximately 1 cm in size. Extension into the ventricular system with the amount of intraventricular blood being very similar. Filling of the fourth ventricle. Dependent blood in both occipital horns. Filling of the third ventricle. Areas of clot in both lateral ventricles. Ventriculomegaly appears the same without further dilatation. Elsewhere, chronic small-vessel ischemic changes are present throughout the white matter. Vascular: There is atherosclerotic calcification of the major vessels at the base of the brain. Skull: Negative Sinuses/Orbits: Fluid level in the sphenoid sinus consistent with rhinosinusitis. Orbits negative Other: None IMPRESSION: 1. Medial left thalamic intraparenchymal hemorrhage appears the same, approximately 1 cm in size. Extension into the ventricular system with the amount of intraventricular blood being very similar. 2. Ventriculomegaly appears the same without further dilatation. 3. Chronic small-vessel ischemic changes of the white matter. 4. Fluid level in the sphenoid sinus consistent with rhinosinusitis. Electronically Signed   By: Nelson Chimes M.D.   On: 05/12/2020 02:26   CT HEAD WO CONTRAST  Result Date: 05/11/2020 CLINICAL DATA:  Mental status change, unknown cause EXAM: CT HEAD WITHOUT CONTRAST TECHNIQUE: Contiguous axial images were obtained from the base of the skull through the vertex without intravenous  contrast. COMPARISON:  02/26/2020 and prior FINDINGS: Brain: Acute left thalamic hemorrhage measuring approximately 1.2 x 0.8 x 0.8 cm. Intraventricular extension involving the lateral, third and fourth ventricles. No midline shift. No ventriculomegaly. Mild cerebral atrophy with ex vacuo dilatation. Chronic microvascular ischemic changes. No mass lesion. Chronic right corona radiata lacunar insult. Vascular: No hyperdense vessel or unexpected calcification. Bilateral skull base atherosclerotic calcifications. Skull: Negative for fracture or focal lesion. Sinuses/Orbits: Normal orbits. Right sphenoid sinus layering secretions. No mastoid effusion. Other: White please note image quality is degraded by motion artifact. 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 Mild cerebral atrophy and chronic microvascular ischemic changes. Chronic right corona radiata lacunar insult. These results were called by telephone at the time of interpretation on 05/11/2020 at 8:29 pm to provider [facility] , who verbally acknowledged these results. Electronically Signed   By: Primitivo Gauze M.D.   On: 05/11/2020 20:31   DG Chest Port 1 View  Result Date: 05/12/2020 CLINICAL DATA:  Adjusted ET and OG tubes EXAM: PORTABLE CHEST 1 VIEW COMPARISON:  Radiograph 05/12/2020 FINDINGS: Endotracheal tube tip terminates in the mid trachea 4.4 cm from the carina. Transesophageal tube has been advanced now with the side port positioned beyond  the GE junction and tip terminating near the gastric body. Stable postsurgical changes related to prior sternotomy and CABG. Cardiomediastinal contours are unremarkable. Some persistent streaky opacities favoring atelectasis. No new consolidative opacities or sizable pleural effusions. No acute osseous or soft tissue abnormality. Degenerative changes are present in the imaged spine and shoulders. Telemetry leads overlie the chest. IMPRESSION: 1. Retraction of the  endotracheal tube, now appropriately positioned in the mid trachea. 2. Interval advancement of transesophageal tube, tip now well positioned at the gastric body. 3. Persistent streaky opacities favoring atelectasis. Electronically Signed   By: Lovena Le M.D.   On: 05/12/2020 06:45   DG CHEST PORT 1 VIEW  Result Date: 05/12/2020 CLINICAL DATA:  Intubation EXAM: PORTABLE CHEST 1 VIEW COMPARISON:  Radiograph 05/11/2020 FINDINGS: Endotracheal tube tip terminates low in the trachea, 1.5 cm from the carina. Consider retracting 2 cm to the mid trachea. Transesophageal tube side port is just distal to the GE junction with the tip below the margins of imaging. Telemetry leads and external support devices overlie the chest. Prior sternotomy. Stable cardiomediastinal contours accounting for differences in technique. Calcified, tortuous aorta. Some hazy and streaky opacities in the lungs are favored to reflect atelectatic change. No pneumothorax. No effusion. No acute osseous or soft tissue abnormality. Degenerative changes are present in the imaged spine and shoulders. IMPRESSION: 1. Endotracheal tube tip terminates low in the trachea, 1.5 cm from the carina. Consider retracting 2 cm to the mid trachea. 2. Transesophageal tube side port is just distal to the GE junction with the tip below the margins of imaging. 3. Hazy and streaky opacities in the lungs favored to reflect atelectasis. These results will be called to the ordering clinician or representative by the Radiologist Assistant, and communication documented in the PACS or [employer]. Electronically Signed   By: Lovena Le M.D.   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 Cardiac silhouette is unremarkable. No airspace disease,  effusion, or pneumothorax. Prior healed bilateral rib fractures. IMPRESSION: 1. No acute intrathoracic process. Electronically Signed   By: Randa Ngo M.D.   On: 05/11/2020 20:29   ECHOCARDIOGRAM COMPLETE  Result Date: 05/12/2020    ECHOCARDIOGRAM REPORT   Patient Name:   BECKHAM CAPISTRAN Date of Exam: 05/12/2020 Medical Rec #:  [id number]  Height:       70.0 in Accession #:    [id number] Weight:       220.5 lb Date of Birth:  1951-01-20  BSA:          2.176 m Patient Age:    89 years   BP:           94/68 mmHg Patient Gender: M          HR:           61 bpm. Exam Location:  Inpatient Procedure: 2D Echo, Cardiac Doppler and Color Doppler Indications:    Stroke I63.9  History:        Patient has prior history of Echocardiogram examinations, most                 recent 02/27/2020. Previous Myocardial Infarction and CAD,                 Stroke; Risk Factors:Diabetes and Hypertension.  Sonographer:  Bernadene Person RDCS Referring Phys: [id number] [place]  1. Left ventricular ejection fraction, by estimation, is 45 to 50%. The left ventricle has mildly decreased function. The left ventricle has no regional wall motion abnormalities. Left ventricular diastolic parameters are indeterminate.  2. Right ventricular systolic function is normal. The right ventricular size is normal. There is normal pulmonary artery systolic pressure.  3. The mitral valve is normal in structure. No evidence of mitral valve regurgitation. No evidence of mitral stenosis.  4. The aortic valve is normal in structure. There is mild calcification of the aortic valve. There is moderate thickening of the aortic valve. Aortic valve regurgitation is not visualized. Mild to moderate aortic valve sclerosis/calcification is present, without any evidence of aortic stenosis.  5. The inferior vena cava is normal in size with greater than 50% respiratory variability, suggesting right atrial pressure of 3 mmHg. Conclusion(s)/Recommendation(s):  No intracardiac source of embolism detected on this transthoracic study. A transesophageal echocardiogram is recommended to exclude cardiac source of embolism if clinically indicated. FINDINGS  Left Ventricle: Left ventricular ejection fraction, by estimation, is 45 to 50%. The left ventricle has mildly decreased function. The left ventricle has no regional wall motion abnormalities. The left ventricular internal cavity size was normal in size. There is no left ventricular hypertrophy. Left ventricular diastolic parameters are indeterminate. Right Ventricle: The right ventricular size is normal. No increase in right ventricular wall thickness. Right ventricular systolic function is normal. There is normal pulmonary artery systolic pressure. The tricuspid regurgitant velocity is 1.90 m/s, and  with an assumed right atrial pressure of 3 mmHg, the estimated right ventricular systolic pressure is 62.9 mmHg. Left Atrium: Left atrial size was normal in size. Right Atrium: Right atrial size was normal in size. Pericardium: There is no evidence of pericardial effusion. Mitral Valve: The mitral valve is normal in structure. Mild mitral annular calcification. No evidence of mitral valve regurgitation. No evidence of mitral valve stenosis. Tricuspid Valve: The tricuspid valve is normal in structure. Tricuspid valve regurgitation is mild . No evidence of tricuspid stenosis. Aortic Valve: The aortic valve is normal in structure. There is mild calcification of the aortic valve. There is moderate thickening of the aortic valve. Aortic valve regurgitation is not visualized. Mild to moderate aortic valve sclerosis/calcification is present, without any evidence of aortic stenosis. Pulmonic Valve: The pulmonic valve was normal in structure. Pulmonic valve regurgitation is not visualized. No evidence of pulmonic stenosis. Aorta: The aortic root is normal in size and structure. Venous: The inferior vena cava is normal in size with  greater than 50% respiratory variability, suggesting right atrial pressure of 3 mmHg. IAS/Shunts: No atrial level shunt detected by color flow Doppler.  LEFT VENTRICLE PLAX 2D LVIDd:         4.40 cm     Diastology LVIDs:         3.40 cm     LV e' medial:    4.78 cm/s LV PW:         0.90 cm     LV E/e' medial:  17.6 LV IVS:        0.90 cm     LV e' lateral:   8.16 cm/s LVOT diam:     2.10 cm     LV E/e' lateral: 10.3 LV SV:         65 LV SV Index:   30 LVOT Area:     3.46 cm  LV Volumes (MOD) LV vol d,  MOD A2C: 65.5 ml LV vol d, MOD A4C: 81.6 ml LV vol s, MOD A2C: 31.1 ml LV vol s, MOD A4C: 40.8 ml LV SV MOD A2C:     34.4 ml LV SV MOD A4C:     81.6 ml LV SV MOD BP:      40.1 ml RIGHT VENTRICLE RV S prime:     3.49 cm/s TAPSE (M-mode): 0.7 cm LEFT ATRIUM             Index       RIGHT ATRIUM           Index LA diam:        4.50 cm 2.07 cm/m  RA Area:     20.80 cm LA Vol (A2C):   47.3 ml 21.74 ml/m RA Volume:   60.90 ml  27.99 ml/m LA Vol (A4C):   35.0 ml 16.09 ml/m LA Biplane Vol: 40.7 ml 18.71 ml/m  AORTIC VALVE LVOT Vmax:   101.00 cm/s LVOT Vmean:  65.000 cm/s LVOT VTI:    0.189 m  AORTA Ao Root diam: 3.70 cm Ao Asc diam:  3.30 cm MITRAL VALVE               TRICUSPID VALVE MV Area (PHT): 3.21 cm    TR Peak grad:   14.4 mmHg MV Decel Time: 236 msec    TR Vmax:        190.00 cm/s MV E velocity: 84.30 cm/s MV A velocity: 33.60 cm/s  SHUNTS MV E/A ratio:  2.51        Systemic VTI:  0.19 m                            Systemic Diam: 2.10 cm Candee Furbish MD Electronically signed by Candee Furbish MD Signature Date/Time: 05/12/2020/1:59:56 PM    Final      Assessment and Plan:   1. Atrial flutter :   His HR is fairly well controlled at this point .  has been in since 02/2020 at least and prob since 10/2019.  Was on Eliquis but reversed.  We do not know if this is a spontaneous bleed vs. Traumatic head trauma.   I dont think he is a candidate for chronic anticoagulation at this point . Will leave in atrial  flutter. 2. 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    3.  4. HTN:  His BP is now well controlled.   5.  6. Acute hypoxic respiratory failure per CCM - still on the vent  7.  8. DM-2 per Dr. Erlinda Hong.  9. QTC prolongation :   The patient is in chronic atrial flutter.  Looking at the EKG I do not think it is possible to accurately measure the QT interval so I do not think that the diagnosis of QT prolongation applies at this time.   Risk Assessment/Risk Scores:          CHA2DS2-VASc Score = 6  This indicates a 9.7% annual risk of stroke. The patient's score is based upon: CHF History: Yes HTN History: Yes Diabetes History: Yes Stroke History: Yes Vascular Disease History: No Age Score: 1 Gender Score: 0         For questions or updates, please contact [place] Please consult [email or web address] for contact info under    Signed, Mertie Moores, MD  05/12/2020 6:52 PM

## 2020-05-12 NOTE — Progress Notes (Signed)
eLink Physician-Brief Progress Note Patient Name: Jason [name] DOB: 1950-08-11 MRN: [id number]   Date of Service  05/12/2020  HPI/Events of Note  ET tube was 1.5 cm above the carina but has been pulled back 2 cm, NG tube side port is at the GE junction.  eICU Interventions  Bedside RN instructed to advance the NG tube 6 cm, and CXR and KUB re-ordered to verify ET and NG tube position.        Thomasene Lot Bobbie Valletta 05/12/2020, 6:12 AM

## 2020-05-12 NOTE — Progress Notes (Signed)
Pharmacy Antibiotic Note  Jason [name] is a 70 y.o. male admitted on 05/11/2020 with AMS, found to have ICH.  EVD placed and Pharmacy has been consulted for Ancef dosing for prophylaxis.  SCr 1.08, CrCL 74 ml/min, now afebrile, WBC WNL.  Plan: Ancef 1gm IV Q8H Pharmacy will sign off and follow peripherally.  Thank you for the consult!  Height: 5\' 10"  (177.8 cm) Weight: 100 kg (220 lb 7.4 oz) IBW/kg (Calculated) : 73  Temp (24hrs), Avg:99 F (37.2 C), Min:97.3 F (36.3 C), Max:100.6 F (38.1 C)  Recent Labs  Lab 05/11/20 1936 05/12/20 0415  WBC 9.2 9.6  CREATININE 0.69 1.08  LATICACIDVEN 1.6  --     Estimated Creatinine Clearance: 74.4 mL/min (by C-G formula based on SCr of 1.08 mg/dL).    No Known Allergies  Ancef 2/25 >>   2/24 UCx -  2/25 MRSA PCR - negative 2/24 BCx -   Jason Ribaudo D. 04-24-1982, PharmD, BCPS, BCCCP 05/12/2020, 2:39 PM

## 2020-05-12 NOTE — Op Note (Signed)
Preop Dx: IVH and Hydrocephaul Postop Dx: Same Procedure: Right frontal IVC placement Surgeon: Venetia Maxon Anesthesia: Lidocaine and IV sedation with Fentanyl and Versed Complications: None  Patient has HCP and IVH with obtundation.  Right frontal scalp was prepped and draped after shaving this region.  Area of planned incision was infiltrated with lidocaine with epinephrine.  A stab incision was created and trephine made. The dura was incised and IVC drain was placed with brisk return of blood tinged CSF under pressure.  The drain was tunneled, anchored and hooked to the Buretrol after incisions were closed with 3-0 Nylon sutures.  The wound was dressed with a sterile occlusive dressing.  The patient appeared to tolerate the procedure well.

## 2020-05-13 ENCOUNTER — Inpatient Hospital Stay (HOSPITAL_COMMUNITY): Payer: Medicare HMO

## 2020-05-13 DIAGNOSIS — J9601 Acute respiratory failure with hypoxia: Secondary | ICD-10-CM

## 2020-05-13 DIAGNOSIS — R29818 Other symptoms and signs involving the nervous system: Secondary | ICD-10-CM

## 2020-05-13 DIAGNOSIS — I4892 Unspecified atrial flutter: Secondary | ICD-10-CM | POA: Diagnosis not present

## 2020-05-13 DIAGNOSIS — I483 Typical atrial flutter: Secondary | ICD-10-CM

## 2020-05-13 DIAGNOSIS — I615 Nontraumatic intracerebral hemorrhage, intraventricular: Secondary | ICD-10-CM | POA: Diagnosis not present

## 2020-05-13 DIAGNOSIS — I161 Hypertensive emergency: Secondary | ICD-10-CM | POA: Diagnosis not present

## 2020-05-13 LAB — CBC WITH DIFFERENTIAL/PLATELET
Abs Immature Granulocytes: 0.03 10*3/uL (ref 0.00–0.07)
Basophils Absolute: 0 10*3/uL (ref 0.0–0.1)
Basophils Relative: 1 %
Eosinophils Absolute: 0.1 10*3/uL (ref 0.0–0.5)
Eosinophils Relative: 2 %
HCT: 43.3 % (ref 39.0–52.0)
Hemoglobin: 14.1 g/dL (ref 13.0–17.0)
Immature Granulocytes: 0 %
Lymphocytes Relative: 23 %
Lymphs Abs: 1.7 10*3/uL (ref 0.7–4.0)
MCH: 28 pg (ref 26.0–34.0)
MCHC: 32.6 g/dL (ref 30.0–36.0)
MCV: 85.9 fL (ref 80.0–100.0)
Monocytes Absolute: 0.8 10*3/uL (ref 0.1–1.0)
Monocytes Relative: 12 %
Neutro Abs: 4.6 10*3/uL (ref 1.7–7.7)
Neutrophils Relative %: 62 %
Platelets: ADEQUATE 10*3/uL (ref 150–400)
RBC: 5.04 MIL/uL (ref 4.22–5.81)
RDW: 13.8 % (ref 11.5–15.5)
WBC: 7.3 10*3/uL (ref 4.0–10.5)
nRBC: 0 % (ref 0.0–0.2)

## 2020-05-13 LAB — COMPREHENSIVE METABOLIC PANEL
ALT: 12 U/L (ref 0–44)
AST: 18 U/L (ref 15–41)
Albumin: 2.6 g/dL — ABNORMAL LOW (ref 3.5–5.0)
Alkaline Phosphatase: 94 U/L (ref 38–126)
Anion gap: 8 (ref 5–15)
BUN: 9 mg/dL (ref 8–23)
CO2: 25 mmol/L (ref 22–32)
Calcium: 8.4 mg/dL — ABNORMAL LOW (ref 8.9–10.3)
Chloride: 106 mmol/L (ref 98–111)
Creatinine, Ser: 0.82 mg/dL (ref 0.61–1.24)
GFR, Estimated: >60 mL/min (ref >60)
Glucose, Bld: 123 mg/dL — ABNORMAL HIGH (ref 70–99)
Potassium: 3.4 mmol/L — ABNORMAL LOW (ref 3.5–5.1)
Sodium: 139 mmol/L (ref 135–145)
Total Bilirubin: 1.1 mg/dL (ref 0.3–1.2)
Total Protein: 5.4 g/dL — ABNORMAL LOW (ref 6.5–8.1)

## 2020-05-13 LAB — GLUCOSE, CAPILLARY
Glucose-Capillary: 104 mg/dL — ABNORMAL HIGH (ref 70–99)
Glucose-Capillary: 124 mg/dL — ABNORMAL HIGH (ref 70–99)
Glucose-Capillary: 160 mg/dL — ABNORMAL HIGH (ref 70–99)
Glucose-Capillary: 166 mg/dL — ABNORMAL HIGH (ref 70–99)
Glucose-Capillary: 201 mg/dL — ABNORMAL HIGH (ref 70–99)
Glucose-Capillary: 234 mg/dL — ABNORMAL HIGH (ref 70–99)

## 2020-05-13 LAB — PROTIME-INR
INR: 1.1 (ref 0.8–1.2)
Prothrombin Time: 13.8 seconds (ref 11.4–15.2)

## 2020-05-13 LAB — PHOSPHORUS
Phosphorus: 3.4 mg/dL (ref 2.5–4.6)
Phosphorus: 3.2 mg/dL (ref 2.5–4.6)

## 2020-05-13 LAB — URINE CULTURE: Culture: NO GROWTH

## 2020-05-13 LAB — MAGNESIUM
Magnesium: 1.9 mg/dL (ref 1.7–2.4)
Magnesium: 2.1 mg/dL (ref 1.7–2.4)

## 2020-05-13 LAB — APTT: aPTT: 31 seconds (ref 24–36)

## 2020-05-13 MED ORDER — POTASSIUM CHLORIDE 20 MEQ PO PACK
40.0000 meq | PACK | Freq: Once | ORAL | Status: AC
  Administered 2020-05-13: 40 meq
  Filled 2020-05-13: qty 2

## 2020-05-13 MED ORDER — METOPROLOL TARTRATE 50 MG PO TABS
50.0000 mg | ORAL_TABLET | Freq: Two times a day (BID) | ORAL | Status: DC
  Administered 2020-05-13 – 2020-05-15 (×6): 50 mg
  Filled 2020-05-13 (×6): qty 1

## 2020-05-13 MED ORDER — BENAZEPRIL HCL 40 MG PO TABS
40.0000 mg | ORAL_TABLET | Freq: Every day | ORAL | Status: DC
  Administered 2020-05-13 – 2020-05-21 (×9): 40 mg
  Filled 2020-05-13 (×10): qty 1

## 2020-05-13 MED ORDER — MAGNESIUM SULFATE 2 GM/50ML IV SOLN
2.0000 g | Freq: Once | INTRAVENOUS | Status: AC
  Administered 2020-05-13: 2 g via INTRAVENOUS
  Filled 2020-05-13: qty 50

## 2020-05-13 MED ORDER — LABETALOL HCL 5 MG/ML IV SOLN
10.0000 mg | INTRAVENOUS | Status: DC | PRN
  Administered 2020-05-13 – 2020-05-24 (×12): 10 mg via INTRAVENOUS
  Filled 2020-05-13 (×14): qty 4

## 2020-05-13 NOTE — Progress Notes (Signed)
RT transported patient from 4N27 to CT and back with RN. No complications. RT will continue to monitor.  

## 2020-05-13 NOTE — Progress Notes (Signed)
PT Cancellation Note  Patient Details Name: Jason [name] MRN: [id number] DOB: 1950-12-29   Cancelled Treatment:    Reason Eval/Treat Not Completed: Medical issues which prohibited therapy. Pt with decreased responsiveness for RN this morning despite reduced sedation. Sent for repeat CT and awaiting results. PT will continue to follow and evaluate as appropriate.   Deland Pretty, DPT   Acute Rehabilitation Department Pager #: [phone]    Gaetana Michaelis 05/13/2020, 12:32 PM

## 2020-05-13 NOTE — Progress Notes (Signed)
PT Cancellation Note  Patient Details Name: Jason [name] MRN: [id number] DOB: 10-Aug-1950   Cancelled Treatment:    Reason Eval/Treat Not Completed: Active bedrest order this morning. PT will continue to follow and evaluate as appropriate.   Rolm Baptise, PT, DPT   Acute Rehabilitation Department Pager #: [phone]  Gaetana Michaelis 05/13/2020, 7:47 AM

## 2020-05-13 NOTE — Progress Notes (Signed)
Patient ID: Jason [name], male   DOB: [birth year], 70 y.o.   MRN: [id number] IVC with modest drainage between 1 and 15 cc/h Patient had episode of decreased responsivity He is being sent for CT nail At this point he opens eyes to painful stimulation he has an upward gaze He is moving his left upper extremity spontaneously We will follow CT scan Drain stays in place for now

## 2020-05-13 NOTE — Progress Notes (Signed)
OT Cancellation Note  Patient Details Name: Daivik Overley MRN: [id number] DOB: 05-04-1950   Cancelled Treatment:    Reason Eval/Treat Not Completed: Patient not medically ready;Medical issues which prohibited therapy (Pt with reduced responsiveness despite lessened sedation. Currently, awaiting results from CT s/p neurological events.)   Flora Lipps, OTR/L Acute Rehabilitation Services Pager: [phone] Office: [phone]   Flora Lipps 05/13/2020, 12:32 PM

## 2020-05-13 NOTE — Progress Notes (Signed)
Patient with change in mental status. Less responsive despite titration of fentanyl and precedex, upward gaze. Neuro rounding, STAT head CT ordered.  Prior to leaving for CT, patient became more responsive with painful stimuli, LUE with purposeful movement to sternal rub.   1300 After returning from CT, patient became very agitated, eyes open and tracking staff, attempting to pull ETT out, combative with staff, purposeful movement X 4 extremities, nodding appropriately to questions.  Unable to redirect, sedation resumed. Patient resting comfortably at this time.

## 2020-05-13 NOTE — Progress Notes (Signed)
Progress Note  Patient Name: Ashtan Girtman Date of Encounter: 05/13/2020  [facility] Cardiologist: Dr Acie Fredrickson  Subjective   Intubated and sedated.  Inpatient Medications    Scheduled Meds: . chlorhexidine gluconate (MEDLINE KIT)  15 mL Mouth Rinse BID  . Chlorhexidine Gluconate Cloth  6 each Topical Daily  . feeding supplement (PROSource TF)  45 mL Per Tube TID  . insulin aspart  0-20 Units Subcutaneous Q4H  . insulin aspart  4 Units Subcutaneous Q4H  . insulin glargine  15 Units Subcutaneous Daily  . mouth rinse  15 mL Mouth Rinse 10 times per day  . metoprolol tartrate  50 mg Per Tube BID  . pantoprazole (PROTONIX) IV  40 mg Intravenous QHS  . polyethylene glycol  17 g Per Tube Daily  . potassium chloride  40 mEq Per Tube Once  . senna-docusate  1 tablet Per Tube BID   Continuous Infusions: .  ceFAZolin (ANCEF) IV Stopped (05/13/20 0545)  . clevidipine Stopped (05/13/20 4332)  . dexmedetomidine (PRECEDEX) IV infusion Stopped (05/13/20 0641)  . feeding supplement (OSMOLITE 1.5 CAL) 1,000 mL (05/12/20 1639)  . fentaNYL infusion INTRAVENOUS 75 mcg/hr (05/13/20 0938)  . thiamine injection Stopped (05/13/20 0625)   PRN Meds: acetaminophen **OR** acetaminophen (TYLENOL) oral liquid 160 mg/5 mL **OR** acetaminophen, fentaNYL, fentaNYL (SUBLIMAZE) injection, labetalol   Vital Signs    Vitals:   05/13/20 0857 05/13/20 0930 05/13/20 0945 05/13/20 1057  BP: (!) 159/81 129/80 131/77   Pulse: 81 61 62   Resp: _0 Temp:      TempSrc:      SpO2: 100% 100% 100% 100%  Weight:      Height:        Intake/Output Summary (Last 24 hours) at 05/13/2020 1203 Last data filed at 05/13/2020 1000 Gross per 24 hour  Intake 2507.28 ml  Output 870 ml  Net 1637.28 ml   Last 3 Weights 05/13/2020 05/11/2020 03/03/2020  Weight (lbs) 206 lb 9.1 oz 220 lb 7.4 oz 219 lb 5.7 oz  Weight (kg) 93.7 kg 100 kg 99.5 kg      Telemetry    Atrial flutter, rate controlled - Personally  Reviewed   Physical Exam   GEN: intubated and sedated Neck: No JVD Cardiac: irregular Respiratory: Clear to auscultation bilaterally. GI: Soft,  non-distended  MS: No edema Neuro:  intubated and sedated  Labs    Chemistry Recent Labs  Lab 05/11/20 1936 05/12/20 0413 05/12/20 0415 05/13/20 0431  NA 139 138 138 139  K 3.4* 3.4* 3.5 3.4*  CL 100  --  102 106  CO2 28  --  24 25  GLUCOSE 166*  --  206* 123*  BUN 8  --  10 9  CREATININE 0.69  --  1.08 0.82  CALCIUM 9.3  --  8.8* 8.4*  PROT 8.1  --  6.3* 5.4*  ALBUMIN 4.4  --  3.2* 2.6*  AST 29  --  25 18  ALT 30  --  25 12  ALKPHOS 144*  --  113 94  BILITOT 0.8  --  1.1 1.1  GFRNONAA >60  --  >60 >60  ANIONGAP 11  --  12 8     Hematology Recent Labs  Lab 05/11/20 1936 05/12/20 0413 05/12/20 0415 05/13/20 0431  WBC 9.2  --  9.6 7.3  RBC 6.04*  --  5.39 5.04  HGB 17.2* 14.6 15.0 14.1  HCT 52.1* 43.0 46.3 43.3  MCV  86.3  --  85.9 85.9  MCH 28.5  --  27.8 28.0  MCHC 33.0  --  32.4 32.6  RDW 13.7  --  13.7 13.8  PLT 163  --  161 PLATELET CLUMPS NOTED ON SMEAR, COUNT APPEARS ADEQUATE    Radiology    DG Abd 1 View  Result Date: 05/12/2020 CLINICAL DATA:  Orogastric tube placement EXAM: ABDOMEN - 1 VIEW COMPARISON:  CT abdomen and pelvis February 26, 2020 FINDINGS: Orogastric tube tip and side port in stomach. Visualized bowel appears unremarkable without bowel dilatation or air-fluid level to suggest bowel obstruction. No free air evident on supine examination. IMPRESSION: Orogastric tube tip and side port in stomach. Visualized bowel appears unremarkable on supine examination. Electronically Signed   By: Lowella Grip III M.D.   On: 05/12/2020 09:31   CT HEAD WO CONTRAST  Result Date: 05/12/2020 CLINICAL DATA:  Follow-up intracranial hemorrhage EXAM: CT HEAD WITHOUT CONTRAST TECHNIQUE: Contiguous axial images were obtained from the base of the skull through the vertex without intravenous contrast.  COMPARISON:  05/11/2020 FINDINGS: Brain: Medial left thalamic intraparenchymal hemorrhage appears the same, approximately 1 cm in size. Extension into the ventricular system with the amount of intraventricular blood being very similar. Filling of the fourth ventricle. Dependent blood in both occipital horns. Filling of the third ventricle. Areas of clot in both lateral ventricles. Ventriculomegaly appears the same without further dilatation. Elsewhere, chronic small-vessel ischemic changes are present throughout the white matter. Vascular: There is atherosclerotic calcification of the major vessels at the base of the brain. Skull: Negative Sinuses/Orbits: Fluid level in the sphenoid sinus consistent with rhinosinusitis. Orbits negative Other: None IMPRESSION: 1. Medial left thalamic intraparenchymal hemorrhage appears the same, approximately 1 cm in size. Extension into the ventricular system with the amount of intraventricular blood being very similar. 2. Ventriculomegaly appears the same without further dilatation. 3. Chronic small-vessel ischemic changes of the white matter. 4. Fluid level in the sphenoid sinus consistent with rhinosinusitis. Electronically Signed   By: Nelson Chimes M.D.   On: 05/12/2020 02:26   CT HEAD WO CONTRAST  Result Date: 05/11/2020 CLINICAL DATA:  Mental status change, unknown cause EXAM: CT HEAD WITHOUT CONTRAST TECHNIQUE: Contiguous axial images were obtained from the base of the skull through the vertex without intravenous contrast. COMPARISON:  02/26/2020 and prior FINDINGS: Brain: Acute left thalamic hemorrhage measuring approximately 1.2 x 0.8 x 0.8 cm. Intraventricular extension involving the lateral, third and fourth ventricles. No midline shift. No ventriculomegaly. Mild cerebral atrophy with ex vacuo dilatation. Chronic microvascular ischemic changes. No mass lesion. Chronic right corona radiata lacunar insult. Vascular: No hyperdense vessel or unexpected calcification.  Bilateral skull base atherosclerotic calcifications. Skull: Negative for fracture or focal lesion. Sinuses/Orbits: Normal orbits. Right sphenoid sinus layering secretions. No mastoid effusion. Other: White please note image quality is degraded by motion artifact. 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 Mild cerebral atrophy and chronic microvascular ischemic changes. Chronic right corona radiata lacunar insult. These results were called by telephone at the time of interpretation on 05/11/2020 at 8:29 pm to provider [facility] , who verbally acknowledged these results. Electronically Signed   By: Primitivo Gauze M.D.   On: 05/11/2020 20:31   DG Chest Port 1 View  Result Date: 05/12/2020 CLINICAL DATA:  Adjusted ET and OG tubes EXAM: PORTABLE CHEST 1 VIEW COMPARISON:  Radiograph 05/12/2020 FINDINGS: Endotracheal tube tip terminates in the mid trachea 4.4 cm from the carina. Transesophageal tube has been  advanced now with the side port positioned beyond the GE junction and tip terminating near the gastric body. Stable postsurgical changes related to prior sternotomy and CABG. Cardiomediastinal contours are unremarkable. Some persistent streaky opacities favoring atelectasis. No new consolidative opacities or sizable pleural effusions. No acute osseous or soft tissue abnormality. Degenerative changes are present in the imaged spine and shoulders. Telemetry leads overlie the chest. IMPRESSION: 1. Retraction of the endotracheal tube, now appropriately positioned in the mid trachea. 2. Interval advancement of transesophageal tube, tip now well positioned at the gastric body. 3. Persistent streaky opacities favoring atelectasis. Electronically Signed   By: Lovena Le M.D.   On: 05/12/2020 06:45   DG CHEST PORT 1 VIEW  Result Date: 05/12/2020 CLINICAL DATA:  Intubation EXAM: PORTABLE CHEST 1 VIEW COMPARISON:  Radiograph 05/11/2020 FINDINGS: Endotracheal tube tip  terminates low in the trachea, 1.5 cm from the carina. Consider retracting 2 cm to the mid trachea. Transesophageal tube side port is just distal to the GE junction with the tip below the margins of imaging. Telemetry leads and external support devices overlie the chest. Prior sternotomy. Stable cardiomediastinal contours accounting for differences in technique. Calcified, tortuous aorta. Some hazy and streaky opacities in the lungs are favored to reflect atelectatic change. No pneumothorax. No effusion. No acute osseous or soft tissue abnormality. Degenerative changes are present in the imaged spine and shoulders. IMPRESSION: 1. Endotracheal tube tip terminates low in the trachea, 1.5 cm from the carina. Consider retracting 2 cm to the mid trachea. 2. Transesophageal tube side port is just distal to the GE junction with the tip below the margins of imaging. 3. Hazy and streaky opacities in the lungs favored to reflect atelectasis. These results will be called to the ordering clinician or representative by the Radiologist Assistant, and communication documented in the PACS or [employer]. Electronically Signed   By: Lovena Le M.D.   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 Cardiac silhouette is unremarkable. No airspace disease, effusion, or pneumothorax. Prior healed bilateral rib fractures. IMPRESSION: 1. No acute intrathoracic process. Electronically Signed   By: Randa Ngo M.D.   On: 05/11/2020 20:29   ECHOCARDIOGRAM COMPLETE  Result Date: 05/12/2020    ECHOCARDIOGRAM REPORT   Patient Name:   RAHKEEM SENFT Date of Exam: 05/12/2020 Medical Rec #:  [id number]  Height:       70.0 in Accession #:    [id number] Weight:       220.5 lb Date of Birth:  1950/11/25  BSA:          2.176 m  Patient Age:    70 years   BP:           94/68 mmHg Patient Gender: M          HR:           61 bpm. Exam Location:  Inpatient Procedure: 2D Echo, Cardiac Doppler and Color Doppler Indications:    Stroke I63.9  History:        Patient has prior history of Echocardiogram examinations, most                 recent 02/27/2020. Previous Myocardial Infarction and CAD,  Stroke; Risk Factors:Diabetes and Hypertension.  Sonographer:    Bernadene Person RDCS Referring Phys: [id number] [place]  1. Left ventricular ejection fraction, by estimation, is 45 to 50%. The left ventricle has mildly decreased function. The left ventricle has no regional wall motion abnormalities. Left ventricular diastolic parameters are indeterminate.  2. Right ventricular systolic function is normal. The right ventricular size is normal. There is normal pulmonary artery systolic pressure.  3. The mitral valve is normal in structure. No evidence of mitral valve regurgitation. No evidence of mitral stenosis.  4. The aortic valve is normal in structure. There is mild calcification of the aortic valve. There is moderate thickening of the aortic valve. Aortic valve regurgitation is not visualized. Mild to moderate aortic valve sclerosis/calcification is present, without any evidence of aortic stenosis.  5. The inferior vena cava is normal in size with greater than 50% respiratory variability, suggesting right atrial pressure of 3 mmHg. Conclusion(s)/Recommendation(s): No intracardiac source of embolism detected on this transthoracic study. A transesophageal echocardiogram is recommended to exclude cardiac source of embolism if clinically indicated. FINDINGS  Left Ventricle: Left ventricular ejection fraction, by estimation, is 45 to 50%. The left ventricle has mildly decreased function. The left ventricle has no regional wall motion abnormalities. The left ventricular internal cavity size was normal in size. There is no left  ventricular hypertrophy. Left ventricular diastolic parameters are indeterminate. Right Ventricle: The right ventricular size is normal. No increase in right ventricular wall thickness. Right ventricular systolic function is normal. There is normal pulmonary artery systolic pressure. The tricuspid regurgitant velocity is 1.90 m/s, and  with an assumed right atrial pressure of 3 mmHg, the estimated right ventricular systolic pressure is 18.4 mmHg. Left Atrium: Left atrial size was normal in size. Right Atrium: Right atrial size was normal in size. Pericardium: There is no evidence of pericardial effusion. Mitral Valve: The mitral valve is normal in structure. Mild mitral annular calcification. No evidence of mitral valve regurgitation. No evidence of mitral valve stenosis. Tricuspid Valve: The tricuspid valve is normal in structure. Tricuspid valve regurgitation is mild . No evidence of tricuspid stenosis. Aortic Valve: The aortic valve is normal in structure. There is mild calcification of the aortic valve. There is moderate thickening of the aortic valve. Aortic valve regurgitation is not visualized. Mild to moderate aortic valve sclerosis/calcification is present, without any evidence of aortic stenosis. Pulmonic Valve: The pulmonic valve was normal in structure. Pulmonic valve regurgitation is not visualized. No evidence of pulmonic stenosis. Aorta: The aortic root is normal in size and structure. Venous: The inferior vena cava is normal in size with greater than 50% respiratory variability, suggesting right atrial pressure of 3 mmHg. IAS/Shunts: No atrial level shunt detected by color flow Doppler.  LEFT VENTRICLE PLAX 2D LVIDd:         4.40 cm     Diastology LVIDs:         3.40 cm     LV e' medial:    4.78 cm/s LV PW:         0.90 cm     LV E/e' medial:  17.6 LV IVS:        0.90 cm     LV e' lateral:   8.16 cm/s LVOT diam:     2.10 cm     LV E/e' lateral: 10.3 LV SV:         65 LV SV Index:   30 LVOT Area:  3.46 cm  LV Volumes (MOD) LV vol d, MOD A2C: 65.5 ml LV vol d, MOD A4C: 81.6 ml LV vol s, MOD A2C: 31.1 ml LV vol s, MOD A4C: 40.8 ml LV SV MOD A2C:     34.4 ml LV SV MOD A4C:     81.6 ml LV SV MOD BP:      40.1 ml RIGHT VENTRICLE RV S prime:     3.49 cm/s TAPSE (M-mode): 0.7 cm LEFT ATRIUM             Index       RIGHT ATRIUM           Index LA diam:        4.50 cm 2.07 cm/m  RA Area:     20.80 cm LA Vol (A2C):   47.3 ml 21.74 ml/m RA Volume:   60.90 ml  27.99 ml/m LA Vol (A4C):   35.0 ml 16.09 ml/m LA Biplane Vol: 40.7 ml 18.71 ml/m  AORTIC VALVE LVOT Vmax:   101.00 cm/s LVOT Vmean:  65.000 cm/s LVOT VTI:    0.189 m  AORTA Ao Root diam: 3.70 cm Ao Asc diam:  3.30 cm MITRAL VALVE               TRICUSPID VALVE MV Area (PHT): 3.21 cm    TR Peak grad:   14.4 mmHg MV Decel Time: 236 msec    TR Vmax:        190.00 cm/s MV E velocity: 84.30 cm/s MV A velocity: 33.60 cm/s  SHUNTS MV E/A ratio:  2.51        Systemic VTI:  0.19 m                            Systemic Diam: 2.10 cm Candee Furbish MD Electronically signed by Candee Furbish MD Signature Date/Time: 05/12/2020/1:59:56 PM    Final     Patient Profile     70 y.o. male with past medical history of paroxysmal atrial fibrillation, coronary artery disease status post coronary artery bypass and graft, prior CVA, diabetes mellitus, hypertension, recurrent falls admitted with intracranial hemorrhage being seen for atrial flutter.  Echocardiogram shows ejection fraction 45 to 50%.  Assessment & Plan    1 atrial flutter-continue metoprolol for rate control.  He is now felt not to be a candidate for anticoagulation given intracranial hemorrhage.  2 hypertension-continue present blood pressure medications.  3 status post intracranial hemorrhage-Per neurosurgery.  4 coronary artery disease-resume statin at discharge.  No aspirin given intracranial hemorrhage.  We will see again Monday.  Please call prior to then with questions.  For questions or updates,  please contact [place] Please consult [email or web address] for contact info under        Signed, Kirk Ruths, MD  05/13/2020, 12:03 PM

## 2020-05-13 NOTE — Consult Note (Signed)
NAME:  Jason [name], MRN:  [id number], DOB:  10/24/1950, LOS: 2 ADMISSION DATE:  05/11/2020, CONSULTATION DATE:  2/25 REFERRING MD:  Dr. Iver Nestle Neurology, CHIEF COMPLAINT:  AMS  Brief History:  70 year old male admitted with hemorrhagic stroke ultimately requiring intubation for airway protection and EVD placed by neurosurgery.   History of Present Illness:  Patient is encephalopathic and/or intubated. Therefore history has been obtained from chart review.   70 year old male with PMH as below, which is significant for CAD, DM, HTN, PAF on AC, and CVA. He was last known well 2/23, and 2/24 in the morning he was found to be confused "talking out of his head". EMS was apparently called, but he was not initially transported to the emergency department for some reason. His mental status worsened and EMS was called again, this time transporting the patient to [facility] ED. In the ED he was noted to be febrile prompting an infectious workup. CT of the head showed ICH with intraventricular extension with concern for hydrocephalus. SBP on presentation 205. Theodoro Parma was given for Eliquis reversal. He was admitted by neurology to ICU where unfortunately his mental status continued to decline. PCCM was consulted for intubation at the same time neurology was consulted for ventriculostomy placement.   Past Medical History:    has a past medical history of Anxiety, Coronary artery disease, Depression, Diabetes mellitus without complication (HCC), Dysrhythmia, Hypertension, Myocardial infarction [facility]), and Stroke (HCC).   Significant Hospital Events:  2/24 admitted for [facility] 2/25 Intubated, EVD placed.   Consults:  Neurosurgery  Procedures:  ETT 2/25 > EVD 2/25 >  Significant Diagnostic Tests:  CT head 2/24 > 1.2cm thalamic hemorrhage with intraventricular extension. Chronic right corona radiata lacunar insult.  CT head 2/25 > Medial left thalamic intraparenchymal hemorrhage appears the same,  approximately 1 cm in size. Extension into the ventricular system with the amount of intraventricular blood being very similar. Ventriculomegaly appears the same without further dilatation. Echocardiogram 05/12/20  1. Left ventricular ejection fraction, by estimation, is 45 to 50%. The  left ventricle has mildly decreased function. The left ventricle has no  regional wall motion abnormalities. Left ventricular diastolic parameters  are indeterminate.  2. Right ventricular systolic function is normal. The right ventricular  size is normal. There is normal pulmonary artery systolic pressure.  3. The mitral valve is normal in structure. No evidence of mitral valve  regurgitation. No evidence of mitral stenosis.  4. The aortic valve is normal in structure. There is mild calcification  of the aortic valve. There is moderate thickening of the aortic valve.  Aortic valve regurgitation is not visualized. Mild to moderate aortic  valve sclerosis/calcification is  present, without any evidence of aortic stenosis.  5. The inferior vena cava is normal in size with greater than 50%  respiratory variability, suggesting right atrial pressure of 3 mmHg.   Micro Data:  Blood 2/24 > Urine 2/24 >  Antimicrobials:     Interim History / Subjective:  This morning rate controlled in the 60-70s. Still in atrial flutter. cleviprex at 1.5 mg/hr. On precedex at 0.6  Objective   Blood pressure (!) 155/88, pulse 62, temperature 98.4 F (36.9 C), temperature source Axillary, resp. rate 13, height 5\' 10"  (1.778 m), weight 93.7 kg, SpO2 100 %.    Vent Mode: PRVC FiO2 (%):  [30 %] 30 % Set Rate:  [12 bmp] 12 bmp Vt Set:  [580 mL] 580 mL PEEP:  [5 cmH20] 5  cmH20 Plateau Pressure:  [15 cmH20-20 cmH20] 20 cmH20   Intake/Output Summary (Last 24 hours) at 05/13/2020 1247 Last data filed at 05/13/2020 1200 Gross per 24 hour  Intake 2715.32 ml  Output 1046 ml  Net 1669.32 ml   Filed Weights   05/11/20 1905  05/13/20 0400  Weight: 100 kg 93.7 kg    Examination: General: elderly male in no acute distress, sedated and intubated HENT: [place]/AT, no JVD Lungs: ctab no wheezes or crackles Cardiovascular: RRR Abdomen: Soft, non-tender, non-distended, BS + Extremities: no edema Neuro: awake, does not follow commands consistently.    EKG this morning personally reviewed - 3:1 atrial flutter in the 80s. Normal axis.    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 Likely hypertensive in nature. Anticoagulated on Eliquis, KCentra given in ED.  - Neurology primary - Appreciate Neurosurgery assistance  for EVD placement.  - MRI pending.  - Keep SBP < 140mmHg - Neuro checks - Echo completed 2/25, not yet read - Carotid dopplers  Hypertensive emergency - Clevidipine infusion for SBP goal < 140, will try to titrate down. - resuming metoprolol, benazepril - had some  Bradycardia with higher doses of precedex.   Acute hypoxemic respiratory failure: secondary to IPH and failure to protect airway.  Plan Continue ventilator support with lung protective strategies  Wean PEEP and FiO2 for sats greater than 90%. Head of bed elevated 30 degrees. Plateau pressures less than 30 cm H20.  SAT/SBT as tolerated, mentation preclude extubation  Ensure adequate pulmonary hygiene  RASS goal 0 to -1.   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 Will continue with rate control. I have added back his home metoprolol as well - monitor fluid balance to avoid hypervolemia  DM2: Hgb A1C 9.4 - improved control - CBG monitoring - on high doses of lantus at home, resuming lower dose with 15 units daily here - SSI, tube feed coverage  Hypokalemia - supplement again 2/26 - Check mag and replete as  needed - Trend  BMP  QTC prolongation - Avoid QT prolonging meds - Repeat EKG.   Best practice (evaluated daily)  Diet: NPO, Tube feeds at goal Pain/Anxiety/Delirium protocol (if indicated): RASS goal 0 to -1 VAP protocol (if indicated): Per protocol DVT prophylaxis: SCD GI prophylaxis: PPI Glucose control: SSI Mobility: BR Disposition:FULL  Goals of Care:  Last date of multidisciplinary goals of care discussion: per primary Family and staff present:  Summary of discussion:  Follow up goals of care discussion due: 3/4 Code Status: FULL  Labs   CBC: Recent Labs  Lab 05/11/20 1936 05/12/20 0413 05/12/20 0415 05/13/20 0431  WBC 9.2  --  9.6 7.3  NEUTROABS 7.8*  --  7.7 4.6  HGB 17.2* 14.6 15.0 14.1  HCT 52.1* 43.0 46.3 43.3  MCV 86.3  --  85.9 85.9  PLT 163  --  161 PLATELET CLUMPS NOTED ON SMEAR, COUNT APPEARS ADEQUATE    Basic Metabolic Panel: Recent Labs  Lab 05/11/20 1936 05/12/20 0413 05/12/20 0415 05/12/20 1053 05/12/20 1710 05/13/20 0431  NA 139 138 138  --   --  139  K 3.4* 3.4* 3.5  --   --  3.4*  CL 100  --  102  --   --  106  CO2 28  --  24  --   --  25  GLUCOSE 166*  --  206*  --   --  123*  BUN 8  --  10  --   --  9  CREATININE 0.69  --  1.08  --   --  0.82  CALCIUM 9.3  --  8.8*  --   --  8.4*  MG  --   --   --  1.9 2.4 1.9  PHOS  --   --   --   --  3.3 3.4   GFR: Estimated Creatinine Clearance: 95 mL/min (by C-G formula based on SCr of 0.82 mg/dL). Recent Labs  Lab 05/11/20 1936 05/12/20 0415 05/13/20 0431  WBC 9.2 9.6 7.3  LATICACIDVEN 1.6  --   --     Liver Function Tests: Recent Labs  Lab 05/11/20 1936 05/12/20 0415 05/13/20 0431  AST 29 25 18   ALT 30 25 12   ALKPHOS 144* 113 94  BILITOT 0.8 1.1 1.1  PROT 8.1 6.3* 5.4*  ALBUMIN 4.4 3.2* 2.6*   No results for input(s): LIPASE, AMYLASE in the last 168 hours. Recent Labs  Lab 05/11/20 1936  AMMONIA 13    ABG    Component Value Date/Time   PHART 7.477 (H)  05/12/2020 0413   PCO2ART 34.1 05/12/2020 0413   PO2ART 113 (H) 05/12/2020 0413   HCO3 25.1 05/12/2020 0413   TCO2 26 05/12/2020 0413   ACIDBASEDEF 15.0 (H) 02/26/2020 1450   O2SAT 99.0 05/12/2020 0413     Coagulation Profile: Recent Labs  Lab 05/11/20 2018  INR 1.1    Cardiac Enzymes: No results for input(s): CKTOTAL, CKMB, CKMBINDEX, TROPONINI in the last 168 hours.  HbA1C: Hgb A1c MFr Bld  Date/Time Value Ref Range Status  05/11/2020 11:40 PM 9.4 (H) 4.8 - 5.6 % Final    Comment:    (NOTE) Pre diabetes:          5.7%-6.4%  Diabetes:              >6.4%  Glycemic control for   <7.0% adults with diabetes   02/26/2020 07:43 PM 12.8 (H) 4.8 - 5.6 % Final    Comment:    (NOTE) Pre diabetes:          5.7%-6.4%  Diabetes:              >6.4%  Glycemic control for   <7.0% adults with diabetes     CBG: Recent Labs  Lab 05/12/20 1945 05/12/20 2323 05/13/20 0328 05/13/20 0804 05/13/20 1211  GLUCAP 90 132* 124* 104* 160*    Critical care time:   The patient is critically ill due to hypertensive emergency, intracranial hemorrhage, atrial tachycardia, respiratory failure.  Critical care was necessary to treat or prevent imminent or life-threatening deterioration.  Critical care was time spent personally by me on the following activities: development of treatment plan with patient and/or surrogate as well as nursing, discussions with consultants, evaluation of patient's response to treatment, examination of patient, obtaining history from patient or surrogate, ordering and performing treatments and interventions, ordering and review of laboratory studies, ordering and review of radiographic studies, pulse oximetry, re-evaluation of patient's condition and participation in multidisciplinary rounds.   Critical Care Time devoted to patient care services described in this note is 36 minutes. This time reflects time of care of this signee 05/15/20 . This critical care  time does not reflect separately billable procedures or procedure time, teaching time or supervisory time of PA/NP/Med student/Med Resident etc  but could involve care discussion time.       Mickel Baas Pulmonary and Critical Care Medicine 05/13/2020 12:48 PM  Pager: see AMION After hours pager: [phone]  If no response to pager , please call [phone] until 7pm After 7:00 pm call Elink  [phone]

## 2020-05-13 NOTE — Progress Notes (Signed)
STROKE TEAM PROGRESS NOTE   INTERVAL HISTORY   Pt still intubated with EVD in place. Per bedside RN drain is putting out 8-155cc/hr. She reports patient was very agitated and restless, tracking and moving all 4 extremities this morning. She reports she adjusted fentanyl downward and titrated precedex. Dr. Thomasena Edis was called to bedside. Stat CT had ordered.   Vitals:   05/13/20 0855 05/13/20 0857 05/13/20 0930 05/13/20 0945  BP:  (!) 159/81 129/80 131/77  Pulse:  81 61 62  Resp: (!) 22 13 13 14   Temp:      TempSrc:      SpO2: 100% 100% 100% 100%  Weight:      Height:       CBC:  Recent Labs  Lab 05/12/20 0415 05/13/20 0431  WBC 9.6 7.3  NEUTROABS 7.7 4.6  HGB 15.0 14.1  HCT 46.3 43.3  MCV 85.9 85.9  PLT 161 PLATELET CLUMPS NOTED ON SMEAR, COUNT APPEARS ADEQUATE   Basic Metabolic Panel:  Recent Labs  Lab 05/12/20 0415 05/12/20 1053 05/12/20 1710 05/13/20 0431  NA 138  --   --  139  K 3.5  --   --  3.4*  CL 102  --   --  106  CO2 24  --   --  25  GLUCOSE 206*  --   --  123*  BUN 10  --   --  9  CREATININE 1.08  --   --  0.82  CALCIUM 8.8*  --   --  8.4*  MG  --    < > 2.4 1.9  PHOS  --   --  3.3 3.4   < > = values in this interval not displayed.   Lipid Panel:  Recent Labs  Lab 05/11/20 2348  CHOL 141  TRIG 54  HDL 46  CHOLHDL 3.1  VLDL 11  LDLCALC 84   HgbA1c:  Recent Labs  Lab 05/11/20 2340  HGBA1C 9.4*   Urine Drug Screen:  Recent Labs  Lab 05/12/20 0432  LABOPIA NONE DETECTED  COCAINSCRNUR NONE DETECTED  LABBENZ NONE DETECTED  AMPHETMU NONE DETECTED  THCU NONE DETECTED  LABBARB NONE DETECTED    Alcohol Level No results for input(s): ETH in the last 168 hours.  IMAGING past 24 hours ECHOCARDIOGRAM COMPLETE  Result Date: 05/12/2020    ECHOCARDIOGRAM REPORT   Patient Name:   Jason [name] Date of Exam: 05/12/2020 Medical Rec #:  05/14/2020  Height:       70.0 in Accession #:    [id number] Weight:       220.5 lb Date of Birth:  Sep 23, 1950  BSA:           2.176 m Patient Age:    70 years   BP:           94/68 mmHg Patient Gender: M          HR:           61 bpm. Exam Location:  Inpatient Procedure: 2D Echo, Cardiac Doppler and Color Doppler Indications:    Stroke I63.9  History:        Patient has prior history of Echocardiogram examinations, most                 recent 02/27/2020. Previous Myocardial Infarction and CAD,                 Stroke; Risk Factors:Diabetes and Hypertension.  Sonographer:    14/02/2020 RDCS  Referring Phys: [id number] SRISHTI L BHAGAT IMPRESSIONS  1. Left ventricular ejection fraction, by estimation, is 45 to 50%. The left ventricle has mildly decreased function. The left ventricle has no regional wall motion abnormalities. Left ventricular diastolic parameters are indeterminate.  2. Right ventricular systolic function is normal. The right ventricular size is normal. There is normal pulmonary artery systolic pressure.  3. The mitral valve is normal in structure. No evidence of mitral valve regurgitation. No evidence of mitral stenosis.  4. The aortic valve is normal in structure. There is mild calcification of the aortic valve. There is moderate thickening of the aortic valve. Aortic valve regurgitation is not visualized. Mild to moderate aortic valve sclerosis/calcification is present, without any evidence of aortic stenosis.  5. The inferior vena cava is normal in size with greater than 50% respiratory variability, suggesting right atrial pressure of 3 mmHg. Conclusion(s)/Recommendation(s): No intracardiac source of embolism detected on this transthoracic study. A transesophageal echocardiogram is recommended to exclude cardiac source of embolism if clinically indicated. FINDINGS  Left Ventricle: Left ventricular ejection fraction, by estimation, is 45 to 50%. The left ventricle has mildly decreased function. The left ventricle has no regional wall motion abnormalities. The left ventricular internal cavity size was normal in size.  There is no left ventricular hypertrophy. Left ventricular diastolic parameters are indeterminate. Right Ventricle: The right ventricular size is normal. No increase in right ventricular wall thickness. Right ventricular systolic function is normal. There is normal pulmonary artery systolic pressure. The tricuspid regurgitant velocity is 1.90 m/s, and  with an assumed right atrial pressure of 3 mmHg, the estimated right ventricular systolic pressure is 17.4 mmHg. Left Atrium: Left atrial size was normal in size. Right Atrium: Right atrial size was normal in size. Pericardium: There is no evidence of pericardial effusion. Mitral Valve: The mitral valve is normal in structure. Mild mitral annular calcification. No evidence of mitral valve regurgitation. No evidence of mitral valve stenosis. Tricuspid Valve: The tricuspid valve is normal in structure. Tricuspid valve regurgitation is mild . No evidence of tricuspid stenosis. Aortic Valve: The aortic valve is normal in structure. There is mild calcification of the aortic valve. There is moderate thickening of the aortic valve. Aortic valve regurgitation is not visualized. Mild to moderate aortic valve sclerosis/calcification is present, without any evidence of aortic stenosis. Pulmonic Valve: The pulmonic valve was normal in structure. Pulmonic valve regurgitation is not visualized. No evidence of pulmonic stenosis. Aorta: The aortic root is normal in size and structure. Venous: The inferior vena cava is normal in size with greater than 50% respiratory variability, suggesting right atrial pressure of 3 mmHg. IAS/Shunts: No atrial level shunt detected by color flow Doppler.  LEFT VENTRICLE PLAX 2D LVIDd:         4.40 cm     Diastology LVIDs:         3.40 cm     LV e' medial:    4.78 cm/s LV PW:         0.90 cm     LV E/e' medial:  17.6 LV IVS:        0.90 cm     LV e' lateral:   8.16 cm/s LVOT diam:     2.10 cm     LV E/e' lateral: 10.3 LV SV:         65 LV SV Index:    30 LVOT Area:     3.46 cm  LV Volumes (MOD) LV vol d, MOD A2C: 65.5  ml LV vol d, MOD A4C: 81.6 ml LV vol s, MOD A2C: 31.1 ml LV vol s, MOD A4C: 40.8 ml LV SV MOD A2C:     34.4 ml LV SV MOD A4C:     81.6 ml LV SV MOD BP:      40.1 ml RIGHT VENTRICLE RV S prime:     3.49 cm/s TAPSE (M-mode): 0.7 cm LEFT ATRIUM             Index       RIGHT ATRIUM           Index LA diam:        4.50 cm 2.07 cm/m  RA Area:     20.80 cm LA Vol (A2C):   47.3 ml 21.74 ml/m RA Volume:   60.90 ml  27.99 ml/m LA Vol (A4C):   35.0 ml 16.09 ml/m LA Biplane Vol: 40.7 ml 18.71 ml/m  AORTIC VALVE LVOT Vmax:   101.00 cm/s LVOT Vmean:  65.000 cm/s LVOT VTI:    0.189 m  AORTA Ao Root diam: 3.70 cm Ao Asc diam:  3.30 cm MITRAL VALVE               TRICUSPID VALVE MV Area (PHT): 3.21 cm    TR Peak grad:   14.4 mmHg MV Decel Time: 236 msec    TR Vmax:        190.00 cm/s MV E velocity: 84.30 cm/s MV A velocity: 33.60 cm/s  SHUNTS MV E/A ratio:  2.51        Systemic VTI:  0.19 m                            Systemic Diam: 2.10 cm Donato Schultz MD Electronically signed by Donato Schultz MD Signature Date/Time: 05/12/2020/1:59:56 PM    Final    PHYSICAL EXAM  Temp:  [97.3 F (36.3 C)-98.7 F (37.1 C)] 97.4 F (36.3 C) (02/26 0800) Pulse Rate:  [44-83] 62 (02/26 0945) Resp:  [12-24] 14 (02/26 0945) BP: (94-159)/(58-99) 131/77 (02/26 0945) SpO2:  [98 %-100 %] 100 % (02/26 0945) FiO2 (%):  [30 %] 30 % (02/26 0855) Weight:  [93.7 kg] 93.7 kg (02/26 0400)  General - Well nourished, well developed, intubated on sedation.  Ophthalmologic - fundi not visualized due to noncooperation.  Cardiovascular - irregularly irregular heart rate and rhythm.  Neuro - intubated on sedation, eyes open on voice, following simple commands. With eye opening, eyes in upward gaze, No blinking to visual threat bilaterally,  Right pupil sluggish at 84mm, left pupil not reactive. Corneal reflex present, gag and cough present. Breathing over the vent.  Facial symmetry  not able to test due to ET tube.  Tongue protrusion not cooperative. BUE 3/5 and symmetrical finger grip, however RLE 3-/5 and LLE 4/5. DTR 1+ and no babinski. Sensation, coordination and gait not tested.  ASSESSMENT/PLAN Mr. Jiovanni Heeter is a 70 y.o. male with history of HTN, DM, CAD/MI, afib on eliquis presenting with AMS, garbled speech, N/V and behavior change.   ICH - left thalamic ICH with IVH, likely due to HTN in the setting of eliquis use  CT left thalamic ICH with IVH, no hydro  Repeat CT head stable hematoma and IVH   2D Echo EF 45-50%, No shunt  Update Head CT State with decline in responsiveness. Neurosurgery aware and will follow HCT result.   LDL 84  HgbA1c 9.4  VTE prophylaxis -  SCDs  Eliquis (apixaban) daily prior to admission, now on No antithrombotic.   No anticoagulant or antiplatelet medications  Therapy recommendations:  pending  Disposition:  Pending   Hypertensive emergency  Home meds:  Lotensin, metoprolol  Stable . Off cleviprex . BP goal < 160 . Long-term BP goal normotensive  Afib on AC  Was on eliquis PTA  Reversed by Kcentra  [facility]  Cardiology following and appreciated: Rate control adequate. Patient deemed not a candidate for chronic AC. Will leave in a fluttter. QT prolongation does not apply.   Respiratory failure  Intubated   On vent  CCM on board and appreciated  Extubate as able  Hyperlipidemia  Home meds:  crestor 20  LDL 84, goal < 70  May continue statin at discharge  Diabetes type II Uncontrolled  Home meds:  insulin  HgbA1c 9.4, goal < 7.0  CBGs 114-132  SSI and Lantus 15u daily and Novolog subq q 4 hrs scheduled  Appreciate Diabetes Coordinator recommandations  Other Stroke Risk Factors  Advanced Age >/= 6265   Hx stroke/TIA  Coronary artery disease and MI  Other Active Problems  Hypokalemia: repleted per CCM  Case discussed and imaging reviewed with Dr. Thomasena Edisollins.  Plan is per his direction.   Mikhael Hendriks A Bailey-Modzik, NP-C   To contact Stroke Continuity provider, please refer to [email or web address]. After hours, contact General Neurology

## 2020-05-13 NOTE — Progress Notes (Signed)
SLP Cancellation Note  Patient Details Name: Jason [name] MRN: [id number] DOB: 1950-05-03   Cancelled treatment:       Reason Eval/Treat Not Completed: Medical issues which prohibited therapy.  Pt remains intubated at this time.  SLP will f/u as appropriate.    Shanon Rosser [place] 05/13/2020, 10:10 AM

## 2020-05-14 ENCOUNTER — Ambulatory Visit (HOSPITAL_COMMUNITY): Payer: Medicare HMO

## 2020-05-14 DIAGNOSIS — I629 Nontraumatic intracranial hemorrhage, unspecified: Secondary | ICD-10-CM | POA: Diagnosis not present

## 2020-05-14 DIAGNOSIS — R569 Unspecified convulsions: Secondary | ICD-10-CM

## 2020-05-14 DIAGNOSIS — I615 Nontraumatic intracerebral hemorrhage, intraventricular: Secondary | ICD-10-CM | POA: Diagnosis not present

## 2020-05-14 DIAGNOSIS — R4182 Altered mental status, unspecified: Secondary | ICD-10-CM | POA: Diagnosis not present

## 2020-05-14 LAB — COMPREHENSIVE METABOLIC PANEL
ALT: 12 U/L (ref 0–44)
AST: 10 U/L — ABNORMAL LOW (ref 15–41)
Albumin: 2.3 g/dL — ABNORMAL LOW (ref 3.5–5.0)
Alkaline Phosphatase: 90 U/L (ref 38–126)
Anion gap: 9 (ref 5–15)
BUN: 12 mg/dL (ref 8–23)
CO2: 25 mmol/L (ref 22–32)
Calcium: 8.3 mg/dL — ABNORMAL LOW (ref 8.9–10.3)
Chloride: 106 mmol/L (ref 98–111)
Creatinine, Ser: 0.66 mg/dL (ref 0.61–1.24)
GFR, Estimated: >60 mL/min (ref >60)
Glucose, Bld: 322 mg/dL — ABNORMAL HIGH (ref 70–99)
Potassium: 4.5 mmol/L (ref 3.5–5.1)
Sodium: 140 mmol/L (ref 135–145)
Total Bilirubin: 0.5 mg/dL (ref 0.3–1.2)
Total Protein: 5.1 g/dL — ABNORMAL LOW (ref 6.5–8.1)

## 2020-05-14 LAB — MAGNESIUM: Magnesium: 2.1 mg/dL (ref 1.7–2.4)

## 2020-05-14 LAB — GLUCOSE, CAPILLARY
Glucose-Capillary: 153 mg/dL — ABNORMAL HIGH (ref 70–99)
Glucose-Capillary: 221 mg/dL — ABNORMAL HIGH (ref 70–99)
Glucose-Capillary: 250 mg/dL — ABNORMAL HIGH (ref 70–99)
Glucose-Capillary: 266 mg/dL — ABNORMAL HIGH (ref 70–99)
Glucose-Capillary: 269 mg/dL — ABNORMAL HIGH (ref 70–99)
Glucose-Capillary: 280 mg/dL — ABNORMAL HIGH (ref 70–99)

## 2020-05-14 LAB — CBC WITH DIFFERENTIAL/PLATELET
Abs Immature Granulocytes: 0.04 10*3/uL (ref 0.00–0.07)
Basophils Absolute: 0.1 10*3/uL (ref 0.0–0.1)
Basophils Relative: 1 %
Eosinophils Absolute: 0.2 10*3/uL (ref 0.0–0.5)
Eosinophils Relative: 2 %
HCT: 42.4 % (ref 39.0–52.0)
Hemoglobin: 14 g/dL (ref 13.0–17.0)
Immature Granulocytes: 1 %
Lymphocytes Relative: 17 %
Lymphs Abs: 1.3 10*3/uL (ref 0.7–4.0)
MCH: 28.9 pg (ref 26.0–34.0)
MCHC: 33 g/dL (ref 30.0–36.0)
MCV: 87.4 fL (ref 80.0–100.0)
Monocytes Absolute: 1.1 10*3/uL — ABNORMAL HIGH (ref 0.1–1.0)
Monocytes Relative: 15 %
Neutro Abs: 4.8 10*3/uL (ref 1.7–7.7)
Neutrophils Relative %: 64 %
Platelets: 106 10*3/uL — ABNORMAL LOW (ref 150–400)
RBC: 4.85 MIL/uL (ref 4.22–5.81)
RDW: 14 % (ref 11.5–15.5)
WBC: 7.4 10*3/uL (ref 4.0–10.5)
nRBC: 0 % (ref 0.0–0.2)

## 2020-05-14 LAB — PHOSPHORUS: Phosphorus: 3.9 mg/dL (ref 2.5–4.6)

## 2020-05-14 MED ORDER — THIAMINE HCL 100 MG PO TABS
100.0000 mg | ORAL_TABLET | Freq: Every day | ORAL | Status: DC
  Administered 2020-05-15 – 2020-06-07 (×24): 100 mg
  Filled 2020-05-14 (×24): qty 1

## 2020-05-14 MED ORDER — ALTEPLASE 2 MG IJ SOLR
1.0000 mg | Freq: Once | INTRAMUSCULAR | Status: DC
  Filled 2020-05-14: qty 2

## 2020-05-14 MED ORDER — ALTEPLASE 2 MG IJ SOLR
2.0000 mg | Freq: Once | INTRAMUSCULAR | Status: AC
  Administered 2020-05-14: 2 mg

## 2020-05-14 NOTE — Procedures (Signed)
Patient Name: Jason [name]  MRN: [id number]  Epilepsy Attending: Charlsie Quest  Referring Physician/Provider: Dr Marisue Humble Date: 05/14/2020 Duration: 28.40 mins  Patient history: 70 yo M with left thalamic ICH with IVH, 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 During mouth care the patient reportedly had up-gaze eye bobbing and what may have been hand automatisms and VS changes. During the event, ~15cc of fluid output from EVD EEG to evaluate for seizure.  Level of alertness: comatose  AEDs during EEG study: None  Technical aspects: This EEG study was done with scalp electrodes positioned according to the 10-20 International system of electrode placement. Electrical activity was acquired at a sampling rate of 500Hz  and reviewed with a high frequency filter of 70Hz  and a low frequency filter of 1Hz . EEG data were recorded continuously and digitally stored.   Description: EEG showed continuous generalized 3 to 6 Hz theta-delta slowing.  Hyperventilation and photic stimulation were not performed.     ABNORMALITY -Continuous slow, generalized  IMPRESSION: This study is suggestive of moderate to evere diffuse encephalopathy, nonspecific etiology. No seizures or epileptiform discharges were seen throughout the recording.  Jason [name] 

## 2020-05-14 NOTE — Progress Notes (Signed)
Patient with ?seizure like activity. Upward gaze, upper body tremors, not appropriately responsive to painful stimuli as previously observed. EVD dumped 15cc output during episode. Dr. Thomasena Edis aware, EEG order placed.

## 2020-05-14 NOTE — Progress Notes (Addendum)
STROKE TEAM PROGRESS NOTE   INTERVAL HISTORY  Pt remains intubated, sedated with EVD in place. Per bedside RN drain did not put out some hours overnight. She reports earlier this am patient w/eyes opening spontaneously moving all 4 extremities, restless and agitated pulling at restraints. Neurosurgery instilled tPA and clamped drain x 1 hour this am.   No visitors at bedside.   Vitals:   05/14/20 0600 05/14/20 0700 05/14/20 0750 05/14/20 0800  BP: (!) 142/71 (!) 150/79 (!) 150/79 134/76  Pulse: 62 63 63 65  Resp: 13 12 12  (!) 21  Temp:    98 F (36.7 C)  TempSrc:    Axillary  SpO2: 100% 100% 100% 100%  Weight:      Height:       CBC:  Recent Labs  Lab 05/13/20 0431 05/14/20 0526  WBC 7.3 7.4  NEUTROABS 4.6 4.8  HGB 14.1 14.0  HCT 43.3 42.4  MCV 85.9 87.4  PLT PLATELET CLUMPS NOTED ON SMEAR, COUNT APPEARS ADEQUATE 106*   Basic Metabolic Panel:  Recent Labs  Lab 05/13/20 0431 05/13/20 1721 05/14/20 0526  NA 139  --  140  K 3.4*  --  4.5  CL 106  --  106  CO2 25  --  25  GLUCOSE 123*  --  322*  BUN 9  --  12  CREATININE 0.82  --  0.66  CALCIUM 8.4*  --  8.3*  MG 1.9 2.1 2.1  PHOS 3.4 3.2 3.9   Lipid Panel:  Recent Labs  Lab 05/11/20 2348  CHOL 141  TRIG 54  HDL 46  CHOLHDL 3.1  VLDL 11  LDLCALC 84   HgbA1c:  Recent Labs  Lab 05/11/20 2340  HGBA1C 9.4*   Urine Drug Screen:  Recent Labs  Lab 05/12/20 0432  LABOPIA NONE DETECTED  COCAINSCRNUR NONE DETECTED  LABBENZ NONE DETECTED  AMPHETMU NONE DETECTED  THCU NONE DETECTED  LABBARB NONE DETECTED    Alcohol Level No results for input(s): ETH in the last 168 hours.  IMAGING past 24 hours CT HEAD WO CONTRAST  Result Date: 05/13/2020 CLINICAL DATA:  Known intraparenchymal hemorrhage with episode of decreased response EXAM: CT HEAD WITHOUT CONTRAST TECHNIQUE: Contiguous axial images were obtained from the base of the skull through the vertex without intravenous contrast. COMPARISON:  Head CT  May 12, 2020 FINDINGS: Brain: Medial left thalamic intraparenchymal hemorrhage appears similar in size measuring approximately 1 cm on series 19, image 3, with increased volume of intraventricular blood extending from the area of hemorrhage. Including filling both the third ventricle with layering blood in the fourth and lateral ventricle. Degree of ventriculomegaly appears similar to prior without increased dilation. Interval placement of a right parietal approach ventriculostomy catheter with tip in the right lateral ventricle. Trace pneumocephalus. Similar extensive chronic small vessel ischemic change are present throughout the white matter. Vascular: No hyperdense vessel. Atherosclerotic calcifications of the major vessels of the base of the brain are again noted. Skull: Right parietal burr hole. Sinuses/Orbits: Layering fluid noted in the sphenoid sinuses consistent with chronic sinusitis. Other: None IMPRESSION: 1. Similar size of medial left thalamic intraparenchymal hemorrhage with increased volume of intraventricular blood extending from the area of hemorrhage. 2. Interval placement of a right parietal approach ventriculostomy catheter with tip in the right lateral ventricle. Degree of ventriculomegaly appears similar to prior without increased dilation. Electronically Signed   By: May 14, 2020 MD   On: 05/13/2020 12:14   PHYSICAL EXAM  Temp:  [  97.4 F (36.3 C)-99 F (37.2 C)] 98 F (36.7 C) (02/27 0800) Pulse Rate:  [61-81] 65 (02/27 0800) Resp:  [12-22] 21 (02/27 0800) BP: (106-185)/(62-91) 134/76 (02/27 0800) SpO2:  [97 %-100 %] 100 % (02/27 0800) FiO2 (%):  [30 %] 30 % (02/27 0750) Weight:  [94.1 kg] 94.1 kg (02/27 0500)  General - Well nourished, well developed, intubated on sedation.  Ophthalmologic - fundi not visualized due to noncooperation.  Cardiovascular - irregularly irregular heart rate and rhythm.  Neuro - intubated on sedation, brow furrowing with verbal  stimulation, not following commands.  Resists exam RUE. With eye opening, eyes in upward gaze, No blinking to visual threat bilaterally, Right pupil sluggish at 74mm, left pupil not reactive. Corneal reflex present, gag and cough present. Breathing over the vent.  Facial symmetry not able to test due to ET tube.  Tongue protrusion not cooperative. BUE 3/5 at least with 5/5 grip strength while resisting exam RUE however RLE 3-/5 and LLE 3-/5. DTR 1+ and no babinski. Sensation, coordination and gait not tested.  ASSESSMENT/PLAN Mr. Ana Liaw is a 70 y.o. male with history of HTN, DM, CAD/MI, afib on eliquis presenting with AMS, garbled speech, N/V and behavior change.   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 Extension of IVH noted. Exam improved on less fentanyl but not back to following commands.   CT left thalamic ICH with IVH, no hydro  Repeat CT head 2/25 stable hematoma and IVH   Repeat CT head 2/26 Medial left thalamic intraparenchymal hemorrhage appearssimilar in size measuring approximately 1 cm on series 19, image 3,with increased volume of intraventricular blood extending from the area of hemorrhage. Including filling both the third ventricle with layering blood in the fourth and lateral ventricle  2D Echo EF 45-50%, No shunt  LDL 84  HgbA1c 9.4  VTE prophylaxis - SCDs  Eliquis (apixaban) daily prior to admission, now on No antithrombotic.   No anticoagulant or antiplatelet medications  Therapy recommendations:  pending  Disposition:  Pending   Hypertensive emergency  Home meds:  Lotensin, metoprolol  Stable . On cleviprex for several hours yesterday and off again since 2000 last pm . BP goal 140-160 . Long-term BP goal normotensive  Afib on AC  Was on eliquis PTA  Reversed by Kcentra  No antithrombotics due to  [facility]  Cardiology following and appreciated: Rate control adequate. Patient deemed not a candidate for chronic AC. Will leave in a fluttter. QT prolongation does not apply.   Respiratory failure  Intubated   On vent  CCM on board and appreciated  Extubate as able  Hyperlipidemia  Home meds:  crestor 20  LDL 84, goal < 70  May continue statin at discharge  Diabetes type II Uncontrolled  Home meds:  insulin  HgbA1c 9.4, goal < 7.0  CBGs 114-132  SSI and Lantus 15u daily and Novolog subq q 4 hrs scheduled  Appreciate Diabetes Coordinator recommandations  Other Stroke Risk Factors  Advanced Age >/= 22   Hx stroke/TIA  Coronary artery disease and MI  Other Active Problems  Hypokalemia: repleted per CCM   Marisue Humble, MD Page: [phone]  To contact Stroke Continuity provider, please refer to [email or web address]. After hours, contact General Neurology

## 2020-05-14 NOTE — Progress Notes (Signed)
SLP Cancellation Note  Patient Details Name: Jason [name] MRN: [id number] DOB: [birth year]   Cancelled treatment:       Reason Eval/Treat Not Completed: Medical issues which prohibited therapy;Other (comment) (continues to be intubated)   Angela Nevin, MA, CCC-SLP Speech Therapy [facility] Acute Rehab

## 2020-05-14 NOTE — Progress Notes (Signed)
PT Cancellation Note  Patient Details Name: Quindon Denker MRN: [id number] DOB: 1950/06/11   Cancelled Treatment:    Reason Eval/Treat Not Completed: Active bedrest order;Medical issues which prohibited therapy. Per RN pt is intubated, sedated, and becomes agitated with any stimulation, not following commands at this time. PT will hold until the pt is better able to safely participate in skilled PT evaluation.   Arlyss Gandy 05/14/2020, 3:38 PM

## 2020-05-14 NOTE — Progress Notes (Signed)
EEG completed, result pending 

## 2020-05-14 NOTE — Progress Notes (Signed)
Patient ID: Jason [name], male   DOB: 08/31/1950, 70 y.o.   MRN: [id number] Ventriculostomy drainage has ceased in the middle of the night.  Patient's neurologic exam does not seem much different some eye-opening without word gaze not following commands.  2 mg TPA placed into the ventricular catheter with the catheter clamped for 1 hour's time.  We will see how this affects his drainage and subsequent CTs in regards to the amount of blood clot present.

## 2020-05-14 NOTE — Progress Notes (Signed)
Brief progress note  During mouth care the patient reportedly had up-gaze eye bobbing and what may have been hand automatisms and VS changes. During the event, ~15cc of fluid output from EVD. Will order routine EEG to evaluate.

## 2020-05-15 DIAGNOSIS — I615 Nontraumatic intracerebral hemorrhage, intraventricular: Secondary | ICD-10-CM | POA: Diagnosis not present

## 2020-05-15 DIAGNOSIS — R4182 Altered mental status, unspecified: Secondary | ICD-10-CM | POA: Diagnosis not present

## 2020-05-15 DIAGNOSIS — I4892 Unspecified atrial flutter: Secondary | ICD-10-CM | POA: Diagnosis not present

## 2020-05-15 DIAGNOSIS — Z978 Presence of other specified devices: Secondary | ICD-10-CM | POA: Diagnosis not present

## 2020-05-15 DIAGNOSIS — I629 Nontraumatic intracranial hemorrhage, unspecified: Secondary | ICD-10-CM | POA: Diagnosis not present

## 2020-05-15 DIAGNOSIS — I483 Typical atrial flutter: Secondary | ICD-10-CM | POA: Diagnosis not present

## 2020-05-15 DIAGNOSIS — E785 Hyperlipidemia, unspecified: Secondary | ICD-10-CM

## 2020-05-15 DIAGNOSIS — I161 Hypertensive emergency: Secondary | ICD-10-CM | POA: Diagnosis not present

## 2020-05-15 DIAGNOSIS — R400 Somnolence: Secondary | ICD-10-CM | POA: Diagnosis not present

## 2020-05-15 DIAGNOSIS — Z7901 Long term (current) use of anticoagulants: Secondary | ICD-10-CM

## 2020-05-15 DIAGNOSIS — J9601 Acute respiratory failure with hypoxia: Secondary | ICD-10-CM | POA: Diagnosis not present

## 2020-05-15 DIAGNOSIS — I611 Nontraumatic intracerebral hemorrhage in hemisphere, cortical: Secondary | ICD-10-CM | POA: Diagnosis not present

## 2020-05-15 DIAGNOSIS — I4891 Unspecified atrial fibrillation: Secondary | ICD-10-CM

## 2020-05-15 LAB — GLUCOSE, CAPILLARY
Glucose-Capillary: 168 mg/dL — ABNORMAL HIGH (ref 70–99)
Glucose-Capillary: 171 mg/dL — ABNORMAL HIGH (ref 70–99)
Glucose-Capillary: 181 mg/dL — ABNORMAL HIGH (ref 70–99)
Glucose-Capillary: 195 mg/dL — ABNORMAL HIGH (ref 70–99)
Glucose-Capillary: 231 mg/dL — ABNORMAL HIGH (ref 70–99)
Glucose-Capillary: 237 mg/dL — ABNORMAL HIGH (ref 70–99)

## 2020-05-15 LAB — CBC WITH DIFFERENTIAL/PLATELET
Abs Immature Granulocytes: 0.04 10*3/uL (ref 0.00–0.07)
Basophils Absolute: 0.1 10*3/uL (ref 0.0–0.1)
Basophils Relative: 1 %
Eosinophils Absolute: 0.1 10*3/uL (ref 0.0–0.5)
Eosinophils Relative: 1 %
HCT: 42.5 % (ref 39.0–52.0)
Hemoglobin: 13.3 g/dL (ref 13.0–17.0)
Immature Granulocytes: 1 %
Lymphocytes Relative: 13 %
Lymphs Abs: 1.1 10*3/uL (ref 0.7–4.0)
MCH: 28.1 pg (ref 26.0–34.0)
MCHC: 31.3 g/dL (ref 30.0–36.0)
MCV: 89.9 fL (ref 80.0–100.0)
Monocytes Absolute: 1.1 10*3/uL — ABNORMAL HIGH (ref 0.1–1.0)
Monocytes Relative: 13 %
Neutro Abs: 6.1 10*3/uL (ref 1.7–7.7)
Neutrophils Relative %: 71 %
Platelets: 109 10*3/uL — ABNORMAL LOW (ref 150–400)
RBC: 4.73 MIL/uL (ref 4.22–5.81)
RDW: 14.2 % (ref 11.5–15.5)
WBC: 8.5 10*3/uL (ref 4.0–10.5)
nRBC: 0 % (ref 0.0–0.2)

## 2020-05-15 LAB — COMPREHENSIVE METABOLIC PANEL
ALT: 10 U/L (ref 0–44)
AST: 10 U/L — ABNORMAL LOW (ref 15–41)
Albumin: 2.3 g/dL — ABNORMAL LOW (ref 3.5–5.0)
Alkaline Phosphatase: 79 U/L (ref 38–126)
Anion gap: 7 (ref 5–15)
BUN: 15 mg/dL (ref 8–23)
CO2: 25 mmol/L (ref 22–32)
Calcium: 8.2 mg/dL — ABNORMAL LOW (ref 8.9–10.3)
Chloride: 108 mmol/L (ref 98–111)
Creatinine, Ser: 0.88 mg/dL (ref 0.61–1.24)
GFR, Estimated: >60 mL/min (ref >60)
Glucose, Bld: 169 mg/dL — ABNORMAL HIGH (ref 70–99)
Potassium: 4.4 mmol/L (ref 3.5–5.1)
Sodium: 140 mmol/L (ref 135–145)
Total Bilirubin: 0.5 mg/dL (ref 0.3–1.2)
Total Protein: 5.2 g/dL — ABNORMAL LOW (ref 6.5–8.1)

## 2020-05-15 MED ORDER — INSULIN ASPART 100 UNIT/ML ~~LOC~~ SOLN
5.0000 [IU] | SUBCUTANEOUS | Status: DC
  Administered 2020-05-15 – 2020-05-16 (×6): 5 [IU] via SUBCUTANEOUS

## 2020-05-15 MED ORDER — ENOXAPARIN SODIUM 40 MG/0.4ML ~~LOC~~ SOLN
40.0000 mg | Freq: Every day | SUBCUTANEOUS | Status: DC
  Administered 2020-05-15 – 2020-06-02 (×19): 40 mg via SUBCUTANEOUS
  Filled 2020-05-15 (×19): qty 0.4

## 2020-05-15 MED ORDER — HYDRALAZINE HCL 20 MG/ML IJ SOLN
20.0000 mg | Freq: Four times a day (QID) | INTRAMUSCULAR | Status: DC | PRN
  Administered 2020-05-15 – 2020-05-19 (×3): 20 mg via INTRAVENOUS
  Filled 2020-05-15 (×3): qty 1

## 2020-05-15 MED ORDER — INSULIN GLARGINE 100 UNIT/ML ~~LOC~~ SOLN
20.0000 [IU] | Freq: Every day | SUBCUTANEOUS | Status: DC
  Administered 2020-05-16: 20 [IU] via SUBCUTANEOUS
  Filled 2020-05-15: qty 0.2

## 2020-05-15 MED ORDER — PANTOPRAZOLE SODIUM 40 MG PO PACK
40.0000 mg | PACK | Freq: Every day | ORAL | Status: DC
  Administered 2020-05-15: 40 mg
  Filled 2020-05-15: qty 20

## 2020-05-15 MED ORDER — FUROSEMIDE 10 MG/ML IJ SOLN
40.0000 mg | Freq: Four times a day (QID) | INTRAMUSCULAR | Status: AC
  Administered 2020-05-15 (×2): 40 mg via INTRAVENOUS
  Filled 2020-05-15 (×2): qty 4

## 2020-05-15 NOTE — Evaluation (Signed)
Occupational Therapy Evaluation Patient Details Name: Jason [name] MRN: [id number] DOB: 11/29/1950 Today's Date: 05/15/2020    History of Present Illness 70 yo male presenting with AMS. 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 Pt s/p R frontal IVC placement and intubation on 2/25. Pt with recent admission after being found down at home with DKA. Other PMH includes: DM, HTN, CVA, CABG, afib with rvr, anxiety, and depression.   Clinical Impression   Per chart review, pt from [place] and with recent admission after being found down at home. Pt currently requiring Total A for ADLs and bed mobility. Pt presenting with decreased cognition, arousal, and activity tolerance. Limited to bed level due to restlessness and elevate BP.  Pt would benefit from further acute OT to facilitate safe dc. Recommend dc to SNF for further OT to optimize safety, independence with ADLs, and return to PLOF.     Follow Up Recommendations  SNF    Equipment Recommendations  None recommended by OT    Recommendations for Other Services PT consult     Precautions / Restrictions Precautions Precautions: Fall;Other (comment) (Intubated, restlesss, sedated)      Mobility Bed Mobility Overal bed mobility: Needs Assistance             General bed mobility comments: Total A for repositioning    Transfers                 General transfer comment: Defer for safety    Balance                                           ADL either performed or assessed with clinical judgement   ADL Overall ADL's : Needs assistance/impaired                                       General ADL Comments: Total A for ADLs and bed mobility     Vision         Perception     Praxis      Pertinent Vitals/Pain Pain Assessment: Faces Faces Pain Scale: Hurts little more Pain Location: Generalized Pain Descriptors / Indicators: Grimacing Pain  Intervention(s): Monitored during session;Repositioned     Hand Dominance Right   Extremity/Trunk Assessment Upper Extremity Assessment Upper Extremity Assessment: RUE deficits/detail;LUE deficits/detail RUE Deficits / Details: WFL for strength; strong. Noting palmar grasp relex. Pt pushing against therapist or pulling. No intentional movement. RUE Coordination: decreased fine motor;decreased gross motor LUE Deficits / Details: WFL for strength; strong. Noting palmar grasp relex. Pt pushing against therapist or pulling. No intentional movement. LUE Coordination: decreased fine motor;decreased gross motor   Lower Extremity Assessment Lower Extremity Assessment: Defer to PT evaluation       Communication     Cognition Arousal/Alertness: Lethargic;Suspect due to medications Behavior During Therapy: Restless Overall Cognitive Status: Difficult to assess                                 General Comments: Not following commands. Grimacing with certain stimuli. Eyes closed and/or rolling back at times.   General Comments  VSS on vent. BP elevated to 160s/110s. Notified RN. limited mobility to bed level only    Exercises  Shoulder Instructions      Home Living Family/patient expects to be discharged to:: Skilled nursing facility                                 Additional Comments: From [place] per RN      Prior Functioning/Environment Level of Independence: Needs assistance        Comments: Per cahrt review, pt was performing BADLs at admission in 2021        OT Problem List: Decreased range of motion;Decreased activity tolerance;Impaired balance (sitting and/or standing);Decreased cognition;Decreased safety awareness;Impaired UE functional use      OT Treatment/Interventions: Self-care/ADL training;Therapeutic exercise;Energy conservation;DME and/or AE instruction;Therapeutic activities;Patient/family education    OT Goals(Current  goals can be found in the care plan section) Acute Rehab OT Goals Patient Stated Goal: Unstated OT Goal Formulation: Patient unable to participate in goal setting Time For Goal Achievement: 05/29/20 Potential to Achieve Goals: Good  OT Frequency: Min 2X/week   Barriers to D/C:            Co-evaluation PT/OT/SLP Co-Evaluation/Treatment: Yes Reason for Co-Treatment: For patient/therapist safety;To address functional/ADL transfers;Complexity of the patient's impairments (multi-system involvement);Necessary to address cognition/behavior during functional activity   OT goals addressed during session: ADL's and self-care      AM-PAC OT "6 Clicks" Daily Activity     Outcome Measure Help from another person eating meals?: Total Help from another person taking care of personal grooming?: Total Help from another person toileting, which includes using toliet, bedpan, or urinal?: Total Help from another person bathing (including washing, rinsing, drying)?: Total Help from another person to put on and taking off regular upper body clothing?: Total Help from another person to put on and taking off regular lower body clothing?: Total 6 Click Score: 6   End of Session Nurse Communication: Mobility status;Other (comment) (bed level only)  Activity Tolerance: Patient limited by lethargy Patient left: in bed;with call bell/phone within reach;with bed alarm set;with restraints reapplied;with SCD's reapplied  OT Visit Diagnosis: Unsteadiness on feet (R26.81);Other abnormalities of gait and mobility (R26.89);Muscle weakness (generalized) (M62.81)                Time: 1884-1660 OT Time Calculation (min): 19 min Charges:  OT General Charges $OT Visit: 1 Visit OT Evaluation $OT Eval Moderate Complexity: 1 Mod  Mahoganie Basher MSOT, OTR/L Acute Rehab Pager: [phone] Office: [phone]  Theodoro Grist Tomi Paddock 05/15/2020, 5:26 PM

## 2020-05-15 NOTE — Progress Notes (Signed)
STROKE TEAM PROGRESS NOTE   INTERVAL HISTORY  EVD decrease output yesterday.  TPA placed into the ventricular catheter.  EVD improved drainage afterwards.  Is ventriculostomy pop-off was raised to 15  cm H20 today and so far his neurological exam has remained stable without any changes..  Blood pressure adequately controlled     Pt remains intubated, sedated on Precedex with EVD in place.  PERRL, spontaneous movement. Loc with upper extremities, WD bilateral lowers.   No visitors at bedside.   Vitals:   05/15/20 1100 05/15/20 1131 05/15/20 1200 05/15/20 1300  BP: 103/61  108/63 (!) 127/111  Pulse: 64  64 63  Resp: 15  17 17   Temp:   98.8 F (37.1 C)   TempSrc:   Axillary   SpO2: 100% 100% 100% 100%  Weight:      Height:       CBC:  Recent Labs  Lab 05/14/20 0526 05/15/20 0251  WBC 7.4 8.5  NEUTROABS 4.8 6.1  HGB 14.0 13.3  HCT 42.4 42.5  MCV 87.4 89.9  PLT 106* 109*   Basic Metabolic Panel:  Recent Labs  Lab 05/13/20 1721 05/14/20 0526 05/15/20 0251  NA  --  140 140  K  --  4.5 4.4  CL  --  106 108  CO2  --  25 25  GLUCOSE  --  322* 169*  BUN  --  12 15  CREATININE  --  0.66 0.88  CALCIUM  --  8.3* 8.2*  MG 2.1 2.1  --   PHOS 3.2 3.9  --    Lipid Panel:  Recent Labs  Lab 05/11/20 2348  CHOL 141  TRIG 54  HDL 46  CHOLHDL 3.1  VLDL 11  LDLCALC 84   HgbA1c:  Recent Labs  Lab 05/11/20 2340  HGBA1C 9.4*   Urine Drug Screen:  Recent Labs  Lab 05/12/20 0432  LABOPIA NONE DETECTED  COCAINSCRNUR NONE DETECTED  LABBENZ NONE DETECTED  AMPHETMU NONE DETECTED  THCU NONE DETECTED  LABBARB NONE DETECTED    Alcohol Level No results for input(s): ETH in the last 168 hours.  IMAGING past 24 hours EEG adult  Result Date: 05/14/2020  IMPRESSION: This study is suggestive of moderate to evere diffuse encephalopathy, nonspecific etiology. No seizures or epileptiform discharges were seen throughout the recording. Priyanka 05/16/2020   PHYSICAL EXAM  Temp:   [98.6 F (37 C)-100 F (37.8 C)] 98.8 F (37.1 C) (02/28 1200) Pulse Rate:  [56-128] 63 (02/28 1300) Resp:  [12-38] 17 (02/28 1300) BP: (100-158)/(58-111) 127/111 (02/28 1300) SpO2:  [99 %-100 %] 100 % (02/28 1300) FiO2 (%):  [30 %] 30 % (02/28 1131) Weight:  [95.4 kg] 95.4 kg (02/28 0500)  General - Well nourished, well developed, intubated on sedation.  Ophthalmologic - fundi not visualized due to noncooperation.  Cardiovascular -A. fib flutter with irregularly irregular heart rate and rhythm.  Neuro - intubated on sedation, eyes closed responds to sternal rub with brow furrowing with verbal stimulation, not following commands.  Dysconjugate gaze, No blinking to visual threat bilaterally, pupils sluggish at 65mm. Corneal reflex present, gag and cough present. Breathing over the vent.  Facial symmetry not able to test due to ET tube.  Tongue protrusion not cooperative. BUE 4-/5 at least with 5/5 grip strength RLE 3/5 and LLE 3/5. DTR 1+ and no babinski. Sensation, coordination and gait not tested.  ASSESSMENT/PLAN [name] is a 70 y.o. male with history of HTN, DM, CAD/MI,  afib on eliquis presenting with AMS, garbled speech, N/V and behavior change.   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 Extension of IVH noted. Exam improved on less fentanyl but not back to following commands.    CT left thalamic ICH with IVH, no hydro  Repeat CT head 2/25 stable hematoma and IVH   Repeat CT head 2/26 Medial left thalamic intraparenchymal hemorrhage appearssimilar in size measuring approximately 1 cm on series 19, image 3,with increased volume of intraventricular blood extending from the area of hemorrhage. Including filling both the third ventricle with layering blood in the fourth and lateral ventricle  2D Echo EF 45-50%, No shunt  LDL  84  HgbA1c 9.4  VTE prophylaxis - SCDs, start Lovenox 40mg  daily  Eliquis (apixaban) daily prior to admission, now on No antithrombotic.   No anticoagulant or antiplatelet medications  Therapy recommendations:  pending  Disposition:  Pending   Hypertensive emergency  Home meds:  Lotensin, metoprolol  Stable  On Benazepril 40mg  daily, metoprolol 50mg  bid . Wean cleviprex to off . Change SBP goal to <160 . Add Hydralazine 20mg  q6hr prn, labetalol 10mg  q2 hr prn . Long-term BP goal normotensive  Afib on AC  Was on eliquis PTA  Reversed by Kcentra  No antithrombotics due to [facility]  Cardiology following and appreciated: Rate control adequate. Patient deemed not a candidate for chronic AC. Will leave in a fluttter. QT prolongation does not apply.   Respiratory failure  Intubated   On vent  CCM on board and appreciated  Extubate as able  Hyperlipidemia  Home meds:  crestor 20  LDL 84, goal < 70  May continue statin at discharge  Diabetes type II Uncontrolled  Home meds:  insulin  HgbA1c 9.4, goal < 7.0  CBGs 195-237  SSI and  Increase Lantus 20u daily and Novolog 5 units subq q 4 hrs scheduled  Appreciate Diabetes Coordinator recommandations  Other Stroke Risk Factors  Advanced Age >/= 85   Hx stroke/TIA  Coronary artery disease and MI  Other Active Problems  Hypokalemia: repleted per CCM  Case discussed and imaging reviewed with Dr. . Plan is per his direction.   Olivencia-Simmons, NP-C  I have personally obtained history,examined this patient, reviewed notes, independently viewed imaging studies, participated in medical decision making and plan of care.ROS completed by me personally and pertinent positives fully documented  I have made any additions or clarifications directly to the above note. Agree with note above.. Continue ventilatory support for respiratory failure and close neurological monitoring and strict blood  pressure control systolic goal below 160.  Continue ventriculostomy drainage and appreciate help from neurosurgical team.  Discussed with critical care team nurse practitioner.This patient is critically ill and at significant risk of neurological worsening, death and care requires constant monitoring of vital signs, hemodynamics,respiratory and cardiac monitoring, extensive review of multiple databases, frequent neurological assessment, discussion with family, other specialists and medical decision making of high complexity.I have made any additions or clarifications directly to the above note.This critical care time does not reflect procedure time, or teaching time or supervisory time of PA/NP/Med Resident etc but could involve care discussion time.  I spent 30 minutes of neurocritical care time  in the care of  this patient.     , MD Medical Director [facility] Pager: [phone]  05/15/2020 3:44 PM  To contact Stroke Continuity provider, please refer to [email or web address]. After hours, contact General Neurology

## 2020-05-15 NOTE — Progress Notes (Signed)
Speech Language Pathology Discharge Patient Details Name: Jason [name] MRN: [id number] DOB: 09/14/1950 Today's Date: 05/15/2020 Time:  -     Patient discharged from SLP services secondary to: continue to be intubated, sedated and ? Seizure activitiy  Please see latest therapy progress note for current level of functioning and progress toward goals.    Progress and discharge plan discussed with patient and/or caregiver: please reorder if/when appropriate       Royce Macadamia 05/15/2020, 8:17 AM   Breck Coons Lonell Face.Ed Nurse, children's [phone] Office [phone]

## 2020-05-15 NOTE — Progress Notes (Signed)
Progress Note  Patient Name: Jason [name] Date of Encounter: 05/15/2020  [facility] Cardiologist: Dr Acie Fredrickson  Subjective   Remains intubated and sedated.  Inpatient Medications    Scheduled Meds: . benazepril  40 mg Per Tube Daily  . chlorhexidine gluconate (MEDLINE KIT)  15 mL Mouth Rinse BID  . Chlorhexidine Gluconate Cloth  6 each Topical Daily  . feeding supplement (PROSource TF)  45 mL Per Tube TID  . insulin aspart  0-20 Units Subcutaneous Q4H  . insulin aspart  4 Units Subcutaneous Q4H  . insulin glargine  15 Units Subcutaneous Daily  . mouth rinse  15 mL Mouth Rinse 10 times per day  . metoprolol tartrate  50 mg Per Tube BID  . pantoprazole sodium  40 mg Per Tube QHS  . polyethylene glycol  17 g Per Tube Daily  . senna-docusate  1 tablet Per Tube BID  . thiamine  100 mg Per Tube Daily   Continuous Infusions: .  ceFAZolin (ANCEF) IV Stopped (05/15/20 0556)  . clevidipine Stopped (05/13/20 2018)  . dexmedetomidine (PRECEDEX) IV infusion 0.7 mcg/kg/hr (05/15/20 0803)  . feeding supplement (OSMOLITE 1.5 CAL) 1,000 mL (05/14/20 1507)  . fentaNYL infusion INTRAVENOUS 100 mcg/hr (05/15/20 0800)   PRN Meds: acetaminophen **OR** acetaminophen (TYLENOL) oral liquid 160 mg/5 mL **OR** acetaminophen, fentaNYL, fentaNYL (SUBLIMAZE) injection, hydrALAZINE, labetalol   Vital Signs    Vitals:   05/15/20 0600 05/15/20 0800 05/15/20 0807 05/15/20 0900  BP: 105/66   (!) 107/58  Pulse: 63   64  Resp: 16   15  Temp:  98.6 F (37 C)    TempSrc:  Axillary    SpO2: 100%  100% 100%  Weight:      Height:        Intake/Output Summary (Last 24 hours) at 05/15/2020 1013 Last data filed at 05/15/2020 0900 Gross per 24 hour  Intake 1996.71 ml  Output 1147 ml  Net 849.71 ml   Last 3 Weights 05/15/2020 05/14/2020 05/13/2020  Weight (lbs) 210 lb 5.1 oz 207 lb 7.3 oz 206 lb 9.1 oz  Weight (kg) 95.4 kg 94.1 kg 93.7 kg      Telemetry    Atrial flutter, rate controlled to mildly  decreased - Personally Reviewed   Physical Exam   GEN: intubated and sedated Neck: no lymphadenopathy noted Cardiac: irregular, no murmur Respiratory: Clear to auscultation bilaterally; no wheeze GI: Soft,  non-distended, no masses MS: No edema Neuro:  intubated and sedated  Labs    Chemistry Recent Labs  Lab 05/13/20 0431 05/14/20 0526 05/15/20 0251  NA 139 140 140  K 3.4* 4.5 4.4  CL 106 106 108  CO2 $Re'25 25 25  'jUr$ GLUCOSE 123* 322* 169*  BUN $Re'9 12 15  'hyU$ CREATININE 0.82 0.66 0.88  CALCIUM 8.4* 8.3* 8.2*  PROT 5.4* 5.1* 5.2*  ALBUMIN 2.6* 2.3* 2.3*  AST 18 10* 10*  ALT $Re'12 12 10  'oqL$ ALKPHOS 94 90 79  BILITOT 1.1 0.5 0.5  GFRNONAA >60 >60 >60  ANIONGAP $RemoveB'8 9 7     'QLFsrpBg$ Hematology Recent Labs  Lab 05/13/20 0431 05/14/20 0526 05/15/20 0251  WBC 7.3 7.4 8.5  RBC 5.04 4.85 4.73  HGB 14.1 14.0 13.3  HCT 43.3 42.4 42.5  MCV 85.9 87.4 89.9  MCH 28.0 28.9 28.1  MCHC 32.6 33.0 31.3  RDW 13.8 14.0 14.2  PLT PLATELET CLUMPS NOTED ON SMEAR, COUNT APPEARS ADEQUATE 106* 109*    Radiology    CT HEAD WO  CONTRAST  Result Date: 05/13/2020 CLINICAL DATA:  Known intraparenchymal hemorrhage with episode of decreased response EXAM: CT HEAD WITHOUT CONTRAST TECHNIQUE: Contiguous axial images were obtained from the base of the skull through the vertex without intravenous contrast. COMPARISON:  Head CT May 12, 2020 FINDINGS: Brain: Medial left thalamic intraparenchymal hemorrhage appears similar in size measuring approximately 1 cm on series 19, image 3, with increased volume of intraventricular blood extending from the area of hemorrhage. Including filling both the third ventricle with layering blood in the fourth and lateral ventricle. Degree of ventriculomegaly appears similar to prior without increased dilation. Interval placement of a right parietal approach ventriculostomy catheter with tip in the right lateral ventricle. Trace pneumocephalus. Similar extensive chronic small vessel  ischemic change are present throughout the white matter. Vascular: No hyperdense vessel. Atherosclerotic calcifications of the major vessels of the base of the brain are again noted. Skull: Right parietal burr hole. Sinuses/Orbits: Layering fluid noted in the sphenoid sinuses consistent with chronic sinusitis. Other: None IMPRESSION: 1. Similar size of medial left thalamic intraparenchymal hemorrhage with increased volume of intraventricular blood extending from the area of hemorrhage. 2. Interval placement of a right parietal approach ventriculostomy catheter with tip in the right lateral ventricle. Degree of ventriculomegaly appears similar to prior without increased dilation. Electronically Signed   By: Dahlia Bailiff MD   On: 05/13/2020 12:14   EEG adult  Result Date: 05/14/2020 Lora Havens, MD     05/14/2020  7:12 PM Patient Name: Jason [name] MRN: [id number] Epilepsy Attending: Lora Havens Referring Physician/Provider: Dr Lynnae Sandhoff Date: 05/14/2020 Duration: 28.40 mins Patient history: 70 yo M with left thalamic ICH with IVH, 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 During mouth care the patient reportedly had up-gaze eye bobbing and what may have been hand automatisms and VS changes. During the event, ~15cc of fluid output from EVD EEG to evaluate for seizure. Level of alertness: comatose AEDs during EEG study: None Technical aspects: This EEG study was done with scalp electrodes positioned according to the 10-20 International system of electrode placement. Electrical activity was acquired at a sampling rate of $Remov'500Hz'yPRVrz$  and reviewed with a high frequency filter of $RemoveB'70Hz'KezIYOab$  and a low frequency filter of $RemoveB'1Hz'lWlWaGyB$ . EEG data were recorded continuously and digitally stored. Description: EEG showed continuous generalized 3 to 6 Hz theta-delta slowing.  Hyperventilation and photic stimulation were not performed.   ABNORMALITY -Continuous slow, generalized IMPRESSION: This study is suggestive of moderate  to evere diffuse encephalopathy, nonspecific etiology. No seizures or epileptiform discharges were seen throughout the recording. Lora Havens    Patient Profile     70 y.o. male with past medical history of paroxysmal atrial fibrillation, coronary artery disease status post coronary artery bypass and graft, prior CVA, diabetes mellitus, hypertension, recurrent falls admitted with intracranial hemorrhage being seen for atrial flutter.  Echocardiogram shows ejection fraction 45 to 50%.  Assessment & Plan    1 atrial flutter-patient remains in atrial flutter.  Continue metoprolol for rate control.  Now felt not to be a candidate for anticoagulation given intracranial hemorrhage.    2 hypertension-blood pressure controlled, continue present blood pressure medications.  3 status post intracranial hemorrhage-Per neurosurgery.  4 coronary artery disease-resume statin at discharge.  No aspirin given intracranial hemorrhage.  We will follow from a distance.  For questions or updates, please contact [place] Please consult [email or web address] for contact info under        Signed, Kirk Ruths,  MD  05/15/2020, 10:13 AM

## 2020-05-15 NOTE — Evaluation (Signed)
Physical Therapy Evaluation Patient Details Name: Jason [name] MRN: [id number] DOB: 07-09-1950 Today's Date: 05/15/2020   History of Present Illness  70 yo male presenting with AMS. 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 Pt s/p R frontal IVC placement and intubation on 2/25. Pt with recent admission after being found down at home with DKA. Other PMH includes: DM, HTN, CVA, CABG, afib with rvr, anxiety, and depression.  Clinical Impression  Pt was restless when aroused, resistive to movement of his limbs and not following any commands.  I did not see that he could focus his gaze or track as we moved around the room, he did not look towards me when I said his name.  He seems strong in his 4 spontaneously moving extremities, however, difficult to say as he is not following commands.  Vent settings are low, PRVC FiO2 30% and PEEP 5, but BPs were progressively increasing with increased stimuli from PTOT, so we did not progress EOB.   PT to follow acutely for deficits listed below.    Follow Up Recommendations SNF    Equipment Recommendations  Hospital bed    Recommendations for Other Services       Precautions / Restrictions Precautions Precautions: Fall;Other (comment) (Intubated, restlesss, sedated) Precaution Comments: IVD-needs to be clamped prior to moving pt or the bed, BP<160 systolic      Mobility  Bed Mobility Overal bed mobility: Needs Assistance             General bed mobility comments: Total A for repositioning    Transfers                 General transfer comment: Defer for safety.  Pt was increasingly restless with stimlulation from PT/OT, BPs elevating and pt remained in posey, bil wrist restraints and mittens.  Ambulation/Gait                Stairs            Wheelchair Mobility    Modified Rankin (Stroke Patients Only) Modified Rankin (Stroke Patients Only) Pre-Morbid Rankin Score: Moderate  disability Modified Rankin: Severe disability     Balance                                             Pertinent Vitals/Pain Pain Assessment: Faces Faces Pain Scale: Hurts little more Pain Location: Generalized Pain Descriptors / Indicators: Grimacing Pain Intervention(s): Limited activity within patient's tolerance;Monitored during session;Repositioned    Home Living Family/patient expects to be discharged to:: Skilled nursing facility                 Additional Comments: From [place] per RN    Prior Function Level of Independence: Needs assistance         Comments: Per chart review, pt was performing BADLs at admission in 2021     Hand Dominance   Dominant Hand: Right    Extremity/Trunk Assessment   Upper Extremity Assessment Upper Extremity Assessment: Defer to OT evaluation RUE Deficits / Details: [facility] for strength; strong. Noting palmar grasp relex. Pt pushing against therapist or pulling. No intentional movement. RUE Coordination: decreased fine motor;decreased gross motor LUE Deficits / Details: WFL for strength; strong. Noting palmar grasp relex. Pt pushing against therapist or pulling. No intentional movement. LUE Coordination: decreased fine motor;decreased gross motor    Lower Extremity  Assessment Lower Extremity Assessment: RLE deficits/detail;LLE deficits/detail RLE Deficits / Details: Pt resistive to bil LE ROM, not real tone, resistance, seems strong and grossly equal bil however difficult to tell as he does not follow any commands today. LLE Deficits / Details: Pt resistive to bil LE ROM, not real tone, resistance, seems strong and grossly equal bil however difficult to tell as he does not follow any commands today.    Cervical / Trunk Assessment Cervical / Trunk Assessment: Normal  Communication   Communication: Other (comment) (intubated ETT)  Cognition Arousal/Alertness: Lethargic;Suspect due to  medications Behavior During Therapy: Restless Overall Cognitive Status: Difficult to assess                                 General Comments: Not following commands. Grimacing with certain stimuli. Eyes closed and/or rolling back at times.      General Comments General comments (skin integrity, edema, etc.): Repositioned in full chair mode to see if his BPs would decrease with increased HOB, he went from 160s to 170s systolically which was outside of his recommended parameters, so we brought him back down.  We were hopeful to get him EOB, but it does not currently appear safe to do so.  His vent settings are low at [facility] FiO2 30% and PEEP 5, but he requires a bit of sedation to remain calm.    Exercises     Assessment/Plan    PT Assessment Patient needs continued PT services  PT Problem List Decreased strength;Decreased activity tolerance;Decreased mobility;Decreased balance;Decreased cognition;Decreased knowledge of use of DME;Decreased knowledge of precautions;Decreased safety awareness;Cardiopulmonary status limiting activity       PT Treatment Interventions DME instruction;Gait training;Stair training;Functional mobility training;Therapeutic exercise;Therapeutic activities;Balance training;Neuromuscular re-education;Cognitive remediation;Patient/family education;Wheelchair mobility training    PT Goals (Current goals can be found in the Care Plan section)  Acute Rehab PT Goals Patient Stated Goal: unable to state PT Goal Formulation: Patient unable to participate in goal setting Time For Goal Achievement: 05/29/20 Potential to Achieve Goals: Good    Frequency Min 3X/week   Barriers to discharge        Co-evaluation PT/OT/SLP Co-Evaluation/Treatment: Yes Reason for Co-Treatment: Complexity of the patient's impairments (multi-system involvement);For patient/therapist safety;Necessary to address cognition/behavior during functional activity;To address  functional/ADL transfers PT goals addressed during session: Mobility/safety with mobility;Strengthening/ROM OT goals addressed during session: ADL's and self-care       AM-PAC PT "6 Clicks" Mobility  Outcome Measure Help needed turning from your back to your side while in a flat bed without using bedrails?: Total Help needed moving from lying on your back to sitting on the side of a flat bed without using bedrails?: Total Help needed moving to and from a bed to a chair (including a wheelchair)?: Total Help needed standing up from a chair using your arms (e.g., wheelchair or bedside chair)?: Total Help needed to walk in hospital room?: Total Help needed climbing 3-5 steps with a railing? : Total 6 Click Score: 6    End of Session Equipment Utilized During Treatment: Oxygen Activity Tolerance: Patient limited by fatigue;Other (comment) (limited by BP parameters, cognition) Patient left: in bed Nurse Communication: Mobility status;Other (comment) (BP) PT Visit Diagnosis: Muscle weakness (generalized) (M62.81);Difficulty in walking, not elsewhere classified (R26.2)    Time: 0347-4259 PT Time Calculation (min) (ACUTE ONLY): 19 min   Charges:   PT Evaluation $PT Eval Moderate Complexity: 1 Mod  Verdene Lennert, PT, DPT  Acute Rehabilitation [phone] pager [phone] office

## 2020-05-15 NOTE — Progress Notes (Signed)
NAME:  Jason [name], MRN:  [id number], DOB:  14-Jun-1950, LOS: 4 ADMISSION DATE:  05/11/2020, CONSULTATION DATE:  2/25 REFERRING MD:  Dr. Iver Nestle Neurology, CHIEF COMPLAINT:  AMS  Brief History:  70 year old male admitted with hemorrhagic stroke ultimately requiring intubation for airway protection and EVD placed by neurosurgery.   History of Present Illness:  Patient is encephalopathic and/or intubated. Therefore history has been obtained from chart review.   70 year old male with PMH as below, which is significant for CAD, DM, HTN, PAF on AC, and CVA. He was last known well 2/23, and 2/24 in the morning he was found to be confused "talking out of his head". EMS was apparently called, but he was not initially transported to the emergency department for some reason. His mental status worsened and EMS was called again, this time transporting the patient to [facility] ED. In the ED he was noted to be febrile prompting an infectious workup. CT of the head showed ICH with intraventricular extension with concern for hydrocephalus. SBP on presentation 205. Theodoro Parma was given for Eliquis reversal. He was admitted by neurology to ICU where unfortunately his mental status continued to decline. PCCM was consulted for intubation at the same time neurology was consulted for ventriculostomy placement.   Past Medical History:    has a past medical history of Anxiety, Coronary artery disease, Depression, Diabetes mellitus without complication (HCC), Dysrhythmia, Hypertension, Myocardial infarction [facility]), and Stroke (HCC).   Significant Hospital Events:  2/24 admitted for [facility] 2/25 Intubated, EVD placed.   Consults:  Neurosurgery  Procedures:  ETT 2/25 > EVD 2/25 >  Significant Diagnostic Tests:   CT head 2/24 > 1.2cm thalamic hemorrhage with intraventricular extension. Chronic right corona radiata lacunar insult.   CT head 2/25 > Medial left thalamic intraparenchymal hemorrhage appears the same,  approximately 1 cm in size. Extension into the ventricular system with the amount of intraventricular blood being very similar. Ventriculomegaly appears the same without further dilatation.  Echocardiogram 05/12/20 estimation, is 45 to 50%.The left ventricle has no  regional wall motion abnormalities.  Micro Data:  Blood 2/24 > Urine 2/24 >  Antimicrobials:    Interim History / Subjective:  No acute events overnight  Remains in A-fluter  Objective   Blood pressure 105/66, pulse 63, temperature 100 F (37.8 C), temperature source Axillary, resp. rate 16, height 5\' 10"  (1.778 m), weight 95.4 kg, SpO2 100 %.    Vent Mode: PRVC FiO2 (%):  [30 %] 30 % Set Rate:  [12 bmp] 12 bmp Vt Set:  [280 mL-580 mL] 580 mL PEEP:  [5 cmH20] 5 cmH20 Pressure Support:  [5 cmH20] 5 cmH20 Plateau Pressure:  [16 cmH20-21 cmH20] 17 cmH20   Intake/Output Summary (Last 24 hours) at 05/15/2020 0818 Last data filed at 05/15/2020 0600 Gross per 24 hour  Intake 2121.69 ml  Output 1139 ml  Net 982.69 ml   Filed Weights   05/13/20 0400 05/14/20 0500 05/15/20 0500  Weight: 93.7 kg 94.1 kg 95.4 kg    Examination: General: Chronically ill appearing elderly male lying in bed on mechanical ventilation, in NAD HEENT: ETT, MM pink/moist, PERRL,  Neuro: Will open eyes to verbal and physical stimuli, able to follow simple commands  CV: s1s2 regular rate and rhythm, no murmur, rubs, or gallops,  PULM:  Diminished bilaterally, tolerating SBT currently, no added breath sounds GI: soft, bowel sounds active in all 4 quadrants, non-tender, non-distended, tolerating TF Extremities: warm/dry, no edema  Skin:  no rashes or lesions  Resolved Hospital Problem list   Hypokalemia   Assessment & Plan:   Intraparenchymal hemorrhage:  -Left thalamic with intraventricular extension. Likely hypertensive in nature. Anticoagulated on Eliquis, KCentra given in ED.  -EVD placed 2/25 P: Management per neurology  Maintain  neuro protective measures; goal for eurothermia, euglycemia, eunatermia, normoxia, and PCO2 goal of 35-40 Nutrition and bowel regiment  Seizure precautions  Aspirations precautions  SBP goal <140 Frequent neuro checks   Hypertensive emergency -BP on arrival was 205/120 P: Continue Lopressor and Benazepril  PRN IV antihypertensives  Close monitoring of hemodynamics in the ICU setting   Acute hypoxemic respiratory failure:  -Secondary to IPH and failure to protect airway.  P: Currently tolerating SBT but mentation remains a barrier to extubation  Continue ventilator support with lung protective strategies  Wean PEEP and FiO2 for sats greater than 90%. Head of bed elevated 30 degrees. Plateau pressures less than 30 cm H20.  Follow intermittent chest x-ray and ABG.   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 Will continue with rate control P: Continuous telemetry  Home Eliquis remains on hold Supplement electrolytes as needed  Monitor volume status closely   DM2:  -Hgb A1C 9.4 P: Continue SSI, currently well controlled  CBG q4hr Continue Lantus  Tube feed coverage   QTC prolongation P: Avoid QTc prolonging med's   Best practice (evaluated daily)  Diet: NPO, Tube feeds at goal Pain/Anxiety/Delirium protocol (if indicated): RASS goal 0 to -1 VAP protocol (if indicated): Per protocol DVT prophylaxis: SCD GI prophylaxis: PPI Glucose control: SSI Mobility: BR Disposition:FULL  Goals of Care:  Last date of multidisciplinary goals of care discussion: per primary Family and staff present:  Summary of discussion:  Follow up goals of care discussion due: 3/4 Code Status: FULL  Labs   CBC: Recent Labs  Lab 05/11/20 1936 05/12/20 0413 05/12/20 0415 05/13/20 0431  05/14/20 0526 05/15/20 0251  WBC 9.2  --  9.6 7.3 7.4 8.5  NEUTROABS 7.8*  --  7.7 4.6 4.8 6.1  HGB 17.2* 14.6 15.0 14.1 14.0 13.3  HCT 52.1* 43.0 46.3 43.3 42.4 42.5  MCV 86.3  --  85.9 85.9 87.4 89.9  PLT 163  --  161 PLATELET CLUMPS NOTED ON SMEAR, COUNT APPEARS ADEQUATE 106* 109*    Basic Metabolic Panel: Recent Labs  Lab 05/11/20 1936 05/12/20 0413 05/12/20 0415 05/12/20 1053 05/12/20 1710 05/13/20 0431 05/13/20 1721 05/14/20 0526 05/15/20 0251  NA 139 138 138  --   --  139  --  140 140  K 3.4* 3.4* 3.5  --   --  3.4*  --  4.5 4.4  CL 100  --  102  --   --  106  --  106 108  CO2 28  --  24  --   --  25  --  25 25  GLUCOSE 166*  --  206*  --   --  123*  --  322* 169*  BUN 8  --  10  --   --  9  --  12 15  CREATININE 0.69  --  1.08  --   --  0.82  --  0.66 0.88  CALCIUM 9.3  --  8.8*  --   --  8.4*  --  8.3* 8.2*  MG  --   --   --  1.9 2.4 1.9 2.1 2.1  --   PHOS  --   --   --   --  3.3 3.4 3.2 3.9  --    GFR: Estimated Creatinine Clearance: 89.3 mL/min (by C-G formula based on SCr of 0.88 mg/dL). Recent Labs  Lab 05/11/20 1936 05/12/20 0415 05/13/20 0431 05/14/20 0526 05/15/20 0251  WBC 9.2 9.6 7.3 7.4 8.5  LATICACIDVEN 1.6  --   --   --   --     Liver Function Tests: Recent Labs  Lab 05/11/20 1936 05/12/20 0415 05/13/20 0431 05/14/20 0526 05/15/20 0251  AST 29 25 18  10* 10*  ALT 30 25 12 12 10   ALKPHOS 144* 113 94 90 79  BILITOT 0.8 1.1 1.1 0.5 0.5  PROT 8.1 6.3* 5.4* 5.1* 5.2*  ALBUMIN 4.4 3.2* 2.6* 2.3* 2.3*   No results for input(s): LIPASE, AMYLASE in the last 168 hours. Recent Labs  Lab 05/11/20 1936  AMMONIA 13    ABG    Component Value Date/Time   PHART 7.477 (H) 05/12/2020 0413   PCO2ART 34.1 05/12/2020 0413   PO2ART 113 (H) 05/12/2020 0413   HCO3 25.1 05/12/2020 0413   TCO2 26 05/12/2020 0413   ACIDBASEDEF 15.0 (H) 02/26/2020 1450   O2SAT 99.0 05/12/2020 0413     Coagulation Profile: Recent Labs  Lab 05/11/20 2018  05/13/20 1721  INR 1.1 1.1    Cardiac Enzymes: No results for input(s): CKTOTAL, CKMB, CKMBINDEX, TROPONINI in the last 168 hours.  HbA1C: Hgb A1c MFr Bld  Date/Time Value Ref Range Status  05/11/2020 11:40 PM 9.4 (H) 4.8 - 5.6 % Final    Comment:    (NOTE) Pre diabetes:          5.7%-6.4%  Diabetes:              >6.4%  Glycemic control for   <7.0% adults with diabetes   02/26/2020 07:43 PM 12.8 (H) 4.8 - 5.6 % Final    Comment:    (NOTE) Pre diabetes:          5.7%-6.4%  Diabetes:              >6.4%  Glycemic control for   <7.0% adults with diabetes     CBG: Recent Labs  Lab 05/14/20 1545 05/14/20 2010 05/14/20 2343 05/15/20 0337 05/15/20 0803  GLUCAP 269* 153* 221* 171* 237*    Critical care time:   Performed by: 05/17/20   Total critical care time: 05/17/20  Critical care time was exclusive of separately billable procedures and treating other patients.  Critical care was necessary to treat or prevent imminent or life-threatening deterioration.  Critical care was time spent personally by me on the following activities: development of treatment plan with patient and/or surrogate as well as nursing, discussions with consultants, evaluation of patient's response to treatment, examination of patient, obtaining history from patient or surrogate, ordering and performing treatments and interventions, ordering and review of laboratory studies, ordering and review of radiographic studies, pulse oximetry and re-evaluation of patient's condition.  Delfin Gant, NP-C [facility] Personal contact information can be found on Amion  If no response please page: Adult pulmonary and critical care medicine pager on Amion unitl 7pm After 7pm please call [phone] 05/15/2020, 8:45 AM

## 2020-05-15 NOTE — Progress Notes (Signed)
Inpatient Diabetes Program Recommendations  AACE/ADA: New Consensus Statement on Inpatient Glycemic Control (2015)  Target Ranges:  Prepandial:   less than 140 mg/dL      Peak postprandial:   less than 180 mg/dL (1-2 hours)      Critically ill patients:  140 - 180 mg/dL   Lab Results  Component Value Date   GLUCAP 237 (H) 05/15/2020   HGBA1C 9.4 (H) 05/11/2020    Review of Glycemic Control Results for DONN, ZANETTI (MRN [id number]) as of 05/15/2020 10:26  Ref. Range 05/15/2020 03:37 05/15/2020 08:03  Glucose-Capillary Latest Ref Range: 70 - 99 mg/dL 983 (H) 382 (H)  Diabetes history: DM2 Outpatient Diabetes medications: Basaglar 20 units QHS, Humalog 2-12 units TID Current orders for Inpatient glycemic control: Novolog 0-20 units Q4H, Novolog 4 units q 4 hours, Osmolite 55 ml/hr, Lantus 15 units daily Inpatient Diabetes Program Recommendations:   Please consider increasing Lantus 20 units daily and increase Novolog 5 units q 4 hours.   Thanks,  Beryl Meager, RN, BC-ADM Inpatient Diabetes Coordinator Pager [phone] (8a-5p)

## 2020-05-15 NOTE — Progress Notes (Signed)
Subjective: Patient reports on the vent and not following commands. He is NAD. No acute events overnight.   Objective: Vital signs in last 24 hours: Temp:  [97.4 F (36.3 C)-100 F (37.8 C)] 100 F (37.8 C) (02/28 0400) Pulse Rate:  [56-128] 63 (02/28 0600) Resp:  [12-38] 16 (02/28 0600) BP: (100-158)/(60-98) 105/66 (02/28 0600) SpO2:  [98 %-100 %] 100 % (02/28 0600) FiO2 (%):  [30 %] 30 % (02/28 0406) Weight:  [95.4 kg] 95.4 kg (02/28 0500)  Intake/Output from previous day: 02/27 0701 - 02/28 0700 In: 2263.6 [I.V.:771.6; NG/GT:1265; IV Piggyback:227] Out: 1149 [Urine:1025; Drains:124] Intake/Output this shift: No intake/output data recorded.  Physical Exam: Patient is intubated. He responds to voice. Not following commands. Pupils 23mm and sluggish B/L. MAEW. BUE 5/5, BLE 4/5. EVD is patent with CDI dressing.  Lab Results: Recent Labs    05/14/20 0526 05/15/20 0251  WBC 7.4 8.5  HGB 14.0 13.3  HCT 42.4 42.5  PLT 106* 109*   BMET Recent Labs    05/14/20 0526 05/15/20 0251  NA 140 140  K 4.5 4.4  CL 106 108  CO2 25 25  GLUCOSE 322* 169*  BUN 12 15  CREATININE 0.66 0.88  CALCIUM 8.3* 8.2*    Studies/Results: CT HEAD WO CONTRAST  Result Date: 05/13/2020 CLINICAL DATA:  Known intraparenchymal hemorrhage with episode of decreased response EXAM: CT HEAD WITHOUT CONTRAST TECHNIQUE: Contiguous axial images were obtained from the base of the skull through the vertex without intravenous contrast. COMPARISON:  Head CT May 12, 2020 FINDINGS: Brain: Medial left thalamic intraparenchymal hemorrhage appears similar in size measuring approximately 1 cm on series 19, image 3, with increased volume of intraventricular blood extending from the area of hemorrhage. Including filling both the third ventricle with layering blood in the fourth and lateral ventricle. Degree of ventriculomegaly appears similar to prior without increased dilation. Interval placement of a right parietal  approach ventriculostomy catheter with tip in the right lateral ventricle. Trace pneumocephalus. Similar extensive chronic small vessel ischemic change are present throughout the white matter. Vascular: No hyperdense vessel. Atherosclerotic calcifications of the major vessels of the base of the brain are again noted. Skull: Right parietal burr hole. Sinuses/Orbits: Layering fluid noted in the sphenoid sinuses consistent with chronic sinusitis. Other: None IMPRESSION: 1. Similar size of medial left thalamic intraparenchymal hemorrhage with increased volume of intraventricular blood extending from the area of hemorrhage. 2. Interval placement of a right parietal approach ventriculostomy catheter with tip in the right lateral ventricle. Degree of ventriculomegaly appears similar to prior without increased dilation. 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 During mouth care the patient reportedly had up-gaze eye bobbing and what may have been hand automatisms and VS changes. During the event, ~15cc of fluid output from EVD EEG to evaluate for seizure. Level of alertness: comatose AEDs during EEG study: None Technical aspects: This EEG study was done with scalp electrodes positioned according to the 10-20 International system of electrode placement. Electrical activity was acquired at a sampling rate of 500Hz  and reviewed with a high frequency filter of 70Hz   and a low frequency filter of 1Hz . EEG data were recorded continuously and digitally stored. Description: EEG showed continuous generalized 3 to 6 Hz theta-delta slowing.  Hyperventilation  and photic stimulation were not performed.   ABNORMALITY -Continuous slow, generalized IMPRESSION: This study is suggestive of moderate to evere diffuse encephalopathy, nonspecific etiology. No seizures or epileptiform discharges were seen throughout the recording. Priyanka    Assessment/Plan: The patient's neurological status appears to be stable. EVD draining well with drainage between 0 and 17 cc/h over last 24h. Will raise EVD to 15 cm H20 today. Continue supportive care. No further neurosurgical recommendations at this time. Call with any questions.   LOS: 4 days     Annabelle Harman, DNP, NP-C 05/15/2020, 7:44 AM

## 2020-05-16 DIAGNOSIS — J9601 Acute respiratory failure with hypoxia: Secondary | ICD-10-CM | POA: Diagnosis not present

## 2020-05-16 DIAGNOSIS — R400 Somnolence: Secondary | ICD-10-CM | POA: Diagnosis not present

## 2020-05-16 DIAGNOSIS — I611 Nontraumatic intracerebral hemorrhage in hemisphere, cortical: Secondary | ICD-10-CM | POA: Diagnosis not present

## 2020-05-16 LAB — CBC WITH DIFFERENTIAL/PLATELET
Abs Immature Granulocytes: 0.06 10*3/uL (ref 0.00–0.07)
Basophils Absolute: 0 10*3/uL (ref 0.0–0.1)
Basophils Relative: 0 %
Eosinophils Absolute: 0 10*3/uL (ref 0.0–0.5)
Eosinophils Relative: 0 %
HCT: 41.6 % (ref 39.0–52.0)
Hemoglobin: 13.4 g/dL (ref 13.0–17.0)
Immature Granulocytes: 1 %
Lymphocytes Relative: 12 %
Lymphs Abs: 1.1 10*3/uL (ref 0.7–4.0)
MCH: 28.4 pg (ref 26.0–34.0)
MCHC: 32.2 g/dL (ref 30.0–36.0)
MCV: 88.1 fL (ref 80.0–100.0)
Monocytes Absolute: 1.1 10*3/uL — ABNORMAL HIGH (ref 0.1–1.0)
Monocytes Relative: 13 %
Neutro Abs: 6.6 10*3/uL (ref 1.7–7.7)
Neutrophils Relative %: 74 %
Platelets: 119 10*3/uL — ABNORMAL LOW (ref 150–400)
RBC: 4.72 MIL/uL (ref 4.22–5.81)
RDW: 14.2 % (ref 11.5–15.5)
WBC: 8.9 10*3/uL (ref 4.0–10.5)
nRBC: 0 % (ref 0.0–0.2)

## 2020-05-16 LAB — CULTURE, BLOOD (ROUTINE X 2)
Culture: NO GROWTH
Culture: NO GROWTH
Special Requests: ADEQUATE

## 2020-05-16 LAB — COMPREHENSIVE METABOLIC PANEL
ALT: 9 U/L (ref 0–44)
AST: 14 U/L — ABNORMAL LOW (ref 15–41)
Albumin: 2.3 g/dL — ABNORMAL LOW (ref 3.5–5.0)
Alkaline Phosphatase: 70 U/L (ref 38–126)
Anion gap: 9 (ref 5–15)
BUN: 32 mg/dL — ABNORMAL HIGH (ref 8–23)
CO2: 27 mmol/L (ref 22–32)
Calcium: 8.2 mg/dL — ABNORMAL LOW (ref 8.9–10.3)
Chloride: 102 mmol/L (ref 98–111)
Creatinine, Ser: 1.25 mg/dL — ABNORMAL HIGH (ref 0.61–1.24)
GFR, Estimated: >60 mL/min (ref >60)
Glucose, Bld: 326 mg/dL — ABNORMAL HIGH (ref 70–99)
Potassium: 4.5 mmol/L (ref 3.5–5.1)
Sodium: 138 mmol/L (ref 135–145)
Total Bilirubin: 0.6 mg/dL (ref 0.3–1.2)
Total Protein: 5.5 g/dL — ABNORMAL LOW (ref 6.5–8.1)

## 2020-05-16 LAB — GLUCOSE, CAPILLARY
Glucose-Capillary: 164 mg/dL — ABNORMAL HIGH (ref 70–99)
Glucose-Capillary: 166 mg/dL — ABNORMAL HIGH (ref 70–99)
Glucose-Capillary: 183 mg/dL — ABNORMAL HIGH (ref 70–99)
Glucose-Capillary: 222 mg/dL — ABNORMAL HIGH (ref 70–99)
Glucose-Capillary: 229 mg/dL — ABNORMAL HIGH (ref 70–99)
Glucose-Capillary: 317 mg/dL — ABNORMAL HIGH (ref 70–99)

## 2020-05-16 MED ORDER — ONDANSETRON HCL 4 MG/2ML IJ SOLN
INTRAMUSCULAR | Status: AC
  Filled 2020-05-16: qty 2

## 2020-05-16 MED ORDER — DEXMEDETOMIDINE HCL IN NACL 400 MCG/100ML IV SOLN
0.4000 ug/kg/h | INTRAVENOUS | Status: DC
  Administered 2020-05-16: 0.7 ug/kg/h via INTRAVENOUS
  Administered 2020-05-16: 0.5 ug/kg/h via INTRAVENOUS
  Administered 2020-05-16: 0.6 ug/kg/h via INTRAVENOUS
  Administered 2020-05-17: 0.8 ug/kg/h via INTRAVENOUS
  Filled 2020-05-16 (×4): qty 100

## 2020-05-16 MED ORDER — DIGOXIN 0.25 MG/ML IJ SOLN
0.1250 mg | Freq: Every day | INTRAMUSCULAR | Status: DC
  Administered 2020-05-17 – 2020-05-18 (×2): 0.125 mg via INTRAVENOUS
  Filled 2020-05-16 (×2): qty 2

## 2020-05-16 MED ORDER — ONDANSETRON HCL 4 MG/2ML IJ SOLN
4.0000 mg | Freq: Four times a day (QID) | INTRAMUSCULAR | Status: DC | PRN
  Administered 2020-05-16 – 2020-06-03 (×3): 4 mg via INTRAVENOUS
  Filled 2020-05-16 (×2): qty 2

## 2020-05-16 MED ORDER — INSULIN GLARGINE 100 UNIT/ML ~~LOC~~ SOLN: 25.0000 [IU] | Freq: Every day | SUBCUTANEOUS | Status: DC

## 2020-05-16 MED ORDER — ATORVASTATIN CALCIUM 40 MG PO TABS
40.0000 mg | ORAL_TABLET | Freq: Every day | ORAL | Status: DC
  Administered 2020-05-17 – 2020-06-01 (×16): 40 mg via NASOGASTRIC
  Filled 2020-05-16 (×17): qty 1

## 2020-05-16 MED ORDER — DIGOXIN 0.25 MG/ML IJ SOLN
0.5000 mg | INTRAMUSCULAR | Status: AC
  Administered 2020-05-16: 0.5 mg via INTRAVENOUS
  Filled 2020-05-16: qty 2

## 2020-05-16 MED ORDER — ORAL CARE MOUTH RINSE
15.0000 mL | Freq: Four times a day (QID) | OROMUCOSAL | Status: DC
  Administered 2020-05-16 – 2020-05-20 (×14): 15 mL via OROMUCOSAL

## 2020-05-16 MED ORDER — DILTIAZEM LOAD VIA INFUSION
10.0000 mg | Freq: Once | INTRAVENOUS | Status: AC
  Administered 2020-05-16: 10 mg via INTRAVENOUS
  Filled 2020-05-16: qty 10

## 2020-05-16 MED ORDER — DILTIAZEM HCL-DEXTROSE 125-5 MG/125ML-% IV SOLN (PREMIX)
5.0000 mg/h | INTRAVENOUS | Status: DC
  Administered 2020-05-16: 15 mg/h via INTRAVENOUS
  Administered 2020-05-16: 5 mg/h via INTRAVENOUS
  Administered 2020-05-17 – 2020-05-18 (×3): 15 mg/h via INTRAVENOUS
  Filled 2020-05-16 (×8): qty 125

## 2020-05-16 MED ORDER — SODIUM CHLORIDE 0.9 % IV BOLUS
500.0000 mL | Freq: Once | INTRAVENOUS | Status: AC
  Administered 2020-05-16: 500 mL via INTRAVENOUS

## 2020-05-16 MED ORDER — DIGOXIN 0.25 MG/ML IJ SOLN
0.2500 mg | Freq: Once | INTRAMUSCULAR | Status: AC
  Administered 2020-05-16: 0.25 mg via INTRAVENOUS
  Filled 2020-05-16: qty 2

## 2020-05-16 MED ORDER — SODIUM CHLORIDE 0.9 % IV SOLN: INTRAVENOUS | Status: DC

## 2020-05-16 MED ORDER — METOPROLOL TARTRATE 50 MG PO TABS
50.0000 mg | ORAL_TABLET | Freq: Three times a day (TID) | ORAL | Status: DC
  Administered 2020-05-17 – 2020-05-18 (×6): 50 mg
  Filled 2020-05-16 (×7): qty 1

## 2020-05-16 MED ORDER — INSULIN GLARGINE 100 UNIT/ML ~~LOC~~ SOLN
20.0000 [IU] | Freq: Every day | SUBCUTANEOUS | Status: DC
  Administered 2020-05-17 – 2020-05-18 (×2): 20 [IU] via SUBCUTANEOUS
  Filled 2020-05-16 (×2): qty 0.2

## 2020-05-16 MED ORDER — INSULIN ASPART 100 UNIT/ML ~~LOC~~ SOLN
7.0000 [IU] | SUBCUTANEOUS | Status: DC
  Administered 2020-05-17 – 2020-05-20 (×19): 7 [IU] via SUBCUTANEOUS

## 2020-05-16 NOTE — Progress Notes (Signed)
Subjective: Patient reports that he is on the vent in NAD, and not following commands. No acute events overnight.   Objective: Vital signs in last 24 hours: Temp:  [98.3 F (36.8 C)-102.5 F (39.2 C)] 99.5 F (37.5 C) (03/01 0400) Pulse Rate:  [63-160] 64 (03/01 0700) Resp:  [12-22] 12 (03/01 0700) BP: (102-186)/(45-170) 103/45 (03/01 0700) SpO2:  [97 %-100 %] 99 % (03/01 0700) FiO2 (%):  [30 %] 30 % (03/01 0403) Weight:  [100 kg] 100 kg (03/01 0402)  Intake/Output from previous day: 02/28 0701 - 03/01 0700 In: 1634.8 [I.V.:819.7; NG/GT:715; IV Piggyback:100] Out: 1711 [Urine:1575; Drains:136] Intake/Output this shift: No intake/output data recorded.   Physical Exam: Patient is intubated. He is alert but not following commands or making eye contact. Pupils 77mm and sluggish B/L. MAEW. BUE 5/5, BLE 4/5. EVD is patent with CDI dressing.    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 During mouth care the patient reportedly had up-gaze eye bobbing and what Jason have been hand automatisms and VS changes. During the event, ~15cc of fluid output from EVD EEG to evaluate for seizure. Level of alertness: comatose AEDs during EEG study: None Technical aspects: This EEG study was done with scalp electrodes positioned according to the 10-20 International system of electrode  placement. Electrical activity was acquired at a sampling rate of 500Hz  and reviewed with a high frequency filter of 70Hz  and a low frequency filter of 1Hz . EEG data were recorded continuously and digitally stored. Description: EEG showed continuous generalized 3 to 6 Hz theta-delta slowing.  Hyperventilation and photic stimulation were not performed.   ABNORMALITY -Continuous slow, generalized IMPRESSION: This study is suggestive of moderate to evere diffuse encephalopathy, nonspecific etiology. No seizures or epileptiform discharges were seen throughout the recording. Priyanka    Assessment/Plan: The patient's neurologicalstatuscontinues to bestable. EVD draining well with drainage between 0 and 20 cc/h over last 24h. Will raise EVD to 20 cm H20 today. If patient tolerates rasing of the EVD today, we will plan to clamp it tomorrow. Continue supportive care.    LOS: 5 days     , DNP, NP-C 05/16/2020, 7:32 AM

## 2020-05-16 NOTE — Progress Notes (Signed)
STROKE TEAM PROGRESS NOTE   INTERVAL HISTORY  EVD continue to drain well after TPA placed into the ventricular catheter yesterday.  Ventriculostomy pop-off was raised to 20 cm H20 today and so far his neurological exam has remained stable without any changes.  Blood pressure adequately controlled. Afebrile currently. Plan for extubation today.    Pt remains intubated, agitated. Remains on Precedex with EVD in place.  PERRL, opens eyes to voice, blinks to threat, sticks out tongue, moves all extremities to command.   He remains intermittently agitated.  He tolerated brief.  Of spontaneous breathing trial. No visitors at bedside.   Vitals:   05/16/20 1143 05/16/20 1200 05/16/20 1300 05/16/20 1400  BP: (!) 151/105 104/88 (!) 146/73 (!) 149/100  Pulse: 87 (!) 124 (!) 118 (!) 117  Resp: 18 (!) 24 (!) 43 (!) 33  Temp:  98.9 F (37.2 C)    TempSrc:  Axillary    SpO2: 100% 100% 99% 100%  Weight:      Height:       CBC:  Recent Labs  Lab 05/15/20 0251 05/16/20 0331  WBC 8.5 8.9  NEUTROABS 6.1 6.6  HGB 13.3 13.4  HCT 42.5 41.6  MCV 89.9 88.1  PLT 109* 119*   Basic Metabolic Panel:  Recent Labs  Lab 05/13/20 1721 05/14/20 0526 05/15/20 0251 05/16/20 0331  NA  --  140 140 138  K  --  4.5 4.4 4.5  CL  --  106 108 102  CO2  --  25 25 27   GLUCOSE  --  322* 169* 326*  BUN  --  12 15 32*  CREATININE  --  0.66 0.88 1.25*  CALCIUM  --  8.3* 8.2* 8.2*  MG 2.1 2.1  --   --   PHOS 3.2 3.9  --   --    Lipid Panel:  Recent Labs  Lab 05/11/20 2348  CHOL 141  TRIG 54  HDL 46  CHOLHDL 3.1  VLDL 11  LDLCALC 84   HgbA1c:  Recent Labs  Lab 05/11/20 2340  HGBA1C 9.4*   Urine Drug Screen:  Recent Labs  Lab 05/12/20 0432  LABOPIA NONE DETECTED  COCAINSCRNUR NONE DETECTED  LABBENZ NONE DETECTED  AMPHETMU NONE DETECTED  THCU NONE DETECTED  LABBARB NONE DETECTED    Alcohol Level No results for input(s): ETH in the last 168 hours.  IMAGING past 24 hours EEG  adult  Result Date: 05/14/2020  IMPRESSION: This study is suggestive of moderate to evere diffuse encephalopathy, nonspecific etiology. No seizures or epileptiform discharges were seen throughout the recording.  PHYSICAL EXAM  Temp:  [98.3 F (36.8 C)-102.5 F (39.2 C)] 98.9 F (37.2 C) (03/01 1200) Pulse Rate:  [64-160] 117 (03/01 1400) Resp:  [12-43] 33 (03/01 1400) BP: (102-186)/(45-170) 149/100 (03/01 1400) SpO2:  [97 %-100 %] 100 % (03/01 1400) FiO2 (%):  [30 %] 30 % (03/01 0802) Weight:  [100 kg] 100 kg (03/01 0402)  General - Well nourished, well developed, intubated on sedation.  Ophthalmologic - fundi not visualized due to noncooperation.  Cardiovascular -A. fib flutter with irregularly irregular heart rate and rhythm.  Neuro -  Patient is intubated and sedated but is awake.  Follows simple commands.  Left gaze preference unable to look to the right of midline.  Blinks to threat on the left but not on the right.  Right facial weakness.  Tongue midline.  Is able to move all 4 extremities against gravity left side slightly more  than right.  No focal weakness.  ASSESSMENT/PLAN [name] is a 70 y.o. male with history of HTN, DM, CAD/MI, afib on eliquis presenting with AMS, garbled speech, N/V and behavior change.   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 Extension of IVH noted. Exam improved on less fentanyl but not back to following commands.    CT left thalamic ICH with IVH, no hydro  Repeat CT head 2/25 stable hematoma and IVH   Repeat CT head 2/26 Medial left thalamic intraparenchymal hemorrhage appearssimilar in size measuring approximately 1 cm on series 19, image 3,with increased volume of intraventricular blood extending from the area of hemorrhage. Including filling both the  third ventricle with layering blood in the fourth and lateral ventricle  2D Echo EF 45-50%, No shunt  LDL 84  HgbA1c 9.4  VTE prophylaxis - SCDs, Lovenox 40mg  daily  Eliquis (apixaban) daily prior to admission, now on No antithrombotic.   No anticoagulant or antiplatelet medications  Therapy recommendations:  PT pending, OT SNF  Disposition:  Pending   Hypertensive emergency  Home meds:  Lotensin, metoprolol  Stable  On Benazepril 40mg  daily, metoprolol 50mg  bid . Wean cleviprex to off . SBP goal to <160 . Hydralazine 20mg  q6hr prn, labetalol 10mg  q2 hr prn . 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 Patient deemed not a candidate for chronic AC. Will leave in a fluttter. QT prolongation does not apply.   Respiratory failure  Trial of extubation today  CCM on board and appreciated  Extubate as able  Hyperlipidemia  Home meds:  crestor 20  LDL 84, goal < 70  May continue statin at discharge  Diabetes type II Uncontrolled  Home meds:  insulin  HgbA1c 9.4, goal < 7.0  CBGs 169-326  SSI , increase Lantus to 25u daily and Novolog 7 units subq q 4 hrs scheduled  Appreciate Diabetes Coordinator recommendations  Elevated Creatinine  Creatinine up to 1.25  Lasix given for diuresis yesterday  Adequate uop  Normal saline bolus  NS 10ml/hr   Other Stroke Risk Factors  Advanced Age >/= 70   Hx stroke/TIA  Coronary artery disease and MI  Other Active Problems    I have personally obtained history,examined this patient, reviewed notes, independently viewed imaging studies, participated in medical decision making and plan of care.ROS completed by me personally and pertinent positives fully documented  I have made any additions or clarifications directly  to the above note. Agree with note above.. Continue spontaneous breathing trials and extubate if possible.  Minimize sedation.  Continue ventriculostomy for CSF drainage as per neurosurgery.  Continue strict control of blood pressure.  Discussed with critical care team This patient is critically ill and at significant risk of neurological worsening, death and care requires constant monitoring of vital signs, hemodynamics,respiratory and cardiac monitoring, extensive review of multiple databases, frequent neurological assessment, discussion with family, other specialists and medical decision making of high complexity.I have made any additions or clarifications directly to the above note.This critical care time does not reflect procedure time, or teaching time or supervisory time of PA/NP/Med Resident etc but  could involve care discussion time.  I spent 30 minutes of neurocritical care time  in the care of  this patient.     Delia Heady, MD Medical Director [facility] Pager: [phone] 05/16/2020 3:32 PM    To contact Stroke Continuity provider, please refer to [email or web address]. After hours, contact General Neurology

## 2020-05-16 NOTE — Procedures (Signed)
Extubation Procedure Note  Patient Details:   Name: Jason [name] DOB: 05/17/1950 MRN: [id number]   Airway Documentation:    Vent end date: 05/16/20 Vent end time: 1050   Evaluation  O2 sats: stable throughout Complications: No apparent complications Patient did tolerate procedure well. Bilateral Breath Sounds: Rhonchi   Patient extubated per MD order & placed on 4L [place]. Patient able to cough and speak post extubation. That's a big change from this morning as patient wasn't following commands at all & fighting to get out of bed & biting ET tube. Patient bit down so hard on bite block in place that a piece of his tooth chipped off.  Jacqulynn Cadet 05/16/2020, 10:58 AM

## 2020-05-16 NOTE — Progress Notes (Signed)
NAME:  Jason [name], MRN:  [id number], DOB:  01-May-1950, LOS: 5 ADMISSION DATE:  05/11/2020, CONSULTATION DATE:  2/25 REFERRING MD:  Dr. Iver Nestle Neurology, CHIEF COMPLAINT:  AMS  Brief History:  70 year old male admitted with hemorrhagic stroke ultimately requiring intubation for airway protection and EVD placed by neurosurgery.   History of Present Illness:  Patient is encephalopathic and/or intubated. Therefore history has been obtained from chart review.   70 year old male with PMH as below, which is significant for CAD, DM, HTN, PAF on AC, and CVA. He was last known well 2/23, and 2/24 in the morning he was found to be confused "talking out of his head". EMS was apparently called, but he was not initially transported to the emergency department for some reason. His mental status worsened and EMS was called again, this time transporting the patient to [facility] ED. In the ED he was noted to be febrile prompting an infectious workup. CT of the head showed ICH with intraventricular extension with concern for hydrocephalus. SBP on presentation 205. Theodoro Parma was given for Eliquis reversal. He was admitted by neurology to ICU where unfortunately his mental status continued to decline. PCCM was consulted for intubation at the same time neurology was consulted for ventriculostomy placement.   Past Medical History:    has a past medical history of Anxiety, Coronary artery disease, Depression, Diabetes mellitus without complication (HCC), Dysrhythmia, Hypertension, Myocardial infarction [facility]), and Stroke (HCC).   Significant Hospital Events:  2/24 admitted for [facility] 2/25 Intubated, EVD placed.   Consults:  Neurosurgery  Procedures:  ETT 2/25 > EVD 2/25 >  Significant Diagnostic Tests:   CT head 2/24 > 1.2cm thalamic hemorrhage with intraventricular extension. Chronic right corona radiata lacunar insult.   CT head 2/25 > Medial left thalamic intraparenchymal hemorrhage appears the same,  approximately 1 cm in size. Extension into the ventricular system with the amount of intraventricular blood being very similar. Ventriculomegaly appears the same without further dilatation.  Echocardiogram 05/12/20 estimation, is 45 to 50%.The left ventricle has no  regional wall motion abnormalities.  Micro Data:  Blood 2/24 > Urine 2/24 >  Antimicrobials:    Interim History / Subjective:  Periods of severe agitation today.  Able to follow commands.  Tolerated brief trial of SBT.  Objective   Blood pressure (!) 173/73, pulse 84, temperature 98.6 F (37 C), temperature source Axillary, resp. rate (!) 29, height 5\' 10"  (1.778 m), weight 100 kg, SpO2 100 %.    Vent Mode: PRVC FiO2 (%):  [30 %] 30 % Set Rate:  [12 bmp] 12 bmp Vt Set:  [580 mL] 580 mL PEEP:  [5 cmH20] 5 cmH20 Plateau Pressure:  [14 cmH20-21 cmH20] 19 cmH20   Intake/Output Summary (Last 24 hours) at 05/16/2020 1140 Last data filed at 05/16/2020 1100 Gross per 24 hour  Intake 2314.26 ml  Output 1722 ml  Net 592.26 ml   Filed Weights   05/14/20 0500 05/15/20 0500 05/16/20 0402  Weight: 94.1 kg 95.4 kg 100 kg    Examination: General: Chronically ill appearing elderly male lying in bed on mechanical ventilation, awake moving all limbs.  Agitated HEENT: ETT, MM pink/moist, PERRL,  Neuro: Will open eyes to verbal and physical stimuli, able to follow simple commands  CV: s1s2 regular rate and rhythm, no murmur, rubs, or gallops,  PULM:  Diminished bilaterally, tolerating SBT currently, no added breath sounds.  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 Likely hypertensive in nature. Anticoagulated on Eliquis, KCentra given in ED. Hypertensive  emergency Acute hypoxemic respiratory failure: -Secondary to IPH and failure to protect airway.  Atrial fibrillation on Eliquis Converted into atrial flutter 2/25, rate 60-80 CAD HFrEF EF 45% DM2: Poorly controlled-Hgb A1C 9.4 QTC prolongation  Plan:  -Trial of extubation.  Wean oxygen as tolerated -Continue EVD drainage -Hold anticoagulation for atrial fibrillation for a month. -Continue metoprolol for rate control.  Best practice (evaluated daily)  Diet: NPO, Tube feeds at goal Pain/Anxiety/Delirium protocol (if indicated): Stop all sedation now that extubated VAP protocol (if indicated): Not extubate DVT prophylaxis: SCD-start prophylactic dose heparin GI prophylaxis: Discontinue now that extubated Glucose control: SSI Mobility: PT OT, SLP Disposition:FULL  Goals of Care:  Last date of multidisciplinary goals of care discussion: per primary Family and staff present:  Summary of discussion:  Follow up goals of care discussion due: 3/4 Code Status: FULL  Labs   CBC: Recent Labs  Lab 05/12/20 0415 05/13/20 0431 05/14/20 0526 05/15/20 0251 05/16/20 0331  WBC 9.6 7.3 7.4 8.5 8.9  NEUTROABS 7.7 4.6 4.8 6.1 6.6  HGB 15.0 14.1 14.0 13.3 13.4  HCT 46.3 43.3 42.4 42.5 41.6  MCV 85.9 85.9 87.4 89.9 88.1  PLT 161 PLATELET CLUMPS NOTED ON SMEAR, COUNT APPEARS ADEQUATE 106* 109* 119*    Basic Metabolic Panel: Recent Labs  Lab 05/12/20 0415 05/12/20 1053 05/12/20 1710 05/13/20 0431 05/13/20 1721 05/14/20 0526 05/15/20 0251 05/16/20 0331  NA 138  --   --  139  --  140 140 138  K 3.5  --   --  3.4*  --  4.5 4.4 4.5  CL 102  --   --  106  --  106 108 102  CO2 24  --   --  25  --  25 25 27   GLUCOSE 206*  --   --  123*  --  322* 169* 326*  BUN 10  --   --  9  --  12 15 32*  CREATININE 1.08  --   --  0.82  --  0.66 0.88 1.25*  CALCIUM 8.8*  --   --  8.4*  --  8.3* 8.2* 8.2*  MG  --  1.9 2.4 1.9 2.1 2.1  --   --   PHOS  --   --  3.3 3.4 3.2 3.9  --   --     GFR: Estimated Creatinine Clearance: 64.2 mL/min (A) (by C-G formula based on SCr of 1.25 mg/dL (H)). Recent Labs  Lab 05/11/20 1936 05/12/20 0415 05/13/20 0431 05/14/20 0526 05/15/20 0251 05/16/20 0331  WBC 9.2   < > 7.3 7.4 8.5 8.9  LATICACIDVEN 1.6  --   --   --   --   --    < > = values in this interval not displayed.    Liver Function Tests: Recent Labs  Lab 05/12/20 0415 05/13/20 0431 05/14/20 0526 05/15/20 0251 05/16/20 0331  AST 25 18 10* 10* 14*  ALT 25 12 12 10 9   ALKPHOS 113 94 90 79 70  BILITOT 1.1 1.1 0.5 0.5 0.6  PROT 6.3* 5.4* 5.1* 5.2* 5.5*  ALBUMIN 3.2* 2.6* 2.3* 2.3* 2.3*   No results for input(s): LIPASE, AMYLASE in the last  168 hours. Recent Labs  Lab 05/11/20 1936  AMMONIA 13    ABG    Component Value Date/Time   PHART 7.477 (H) 05/12/2020 0413   PCO2ART 34.1 05/12/2020 0413   PO2ART 113 (H) 05/12/2020 0413   HCO3 25.1 05/12/2020 0413   TCO2 26 05/12/2020 0413   ACIDBASEDEF 15.0 (H) 02/26/2020 1450   O2SAT 99.0 05/12/2020 0413     Coagulation Profile: Recent Labs  Lab 05/11/20 2018 05/13/20 1721  INR 1.1 1.1    Cardiac Enzymes: No results for input(s): CKTOTAL, CKMB, CKMBINDEX, TROPONINI in the last 168 hours.  HbA1C: Hgb A1c MFr Bld  Date/Time Value Ref Range Status  05/11/2020 11:40 PM 9.4 (H) 4.8 - 5.6 % Final    Comment:    (NOTE) Pre diabetes:          5.7%-6.4%  Diabetes:              >6.4%  Glycemic control for   <7.0% adults with diabetes   02/26/2020 07:43 PM 12.8 (H) 4.8 - 5.6 % Final    Comment:    (NOTE) Pre diabetes:          5.7%-6.4%  Diabetes:              >6.4%  Glycemic control for   <7.0% adults with diabetes     CBG: Recent Labs  Lab 05/15/20 1553 05/15/20 1944 05/15/20 2334 05/16/20 0343 05/16/20 0741  GLUCAP 231* 168* 181* 317* 222*    CRITICAL CARE Performed by: Lynnell Catalan   Total critical care time: 45 minutes  Critical care time was exclusive of separately  billable procedures and treating other patients.  Critical care was necessary to treat or prevent imminent or life-threatening deterioration.  Critical care was time spent personally by me on the following activities: development of treatment plan with patient and/or surrogate as well as nursing, discussions with consultants, evaluation of patient's response to treatment, examination of patient, obtaining history from patient or surrogate, ordering and performing treatments and interventions, ordering and review of laboratory studies, ordering and review of radiographic studies, pulse oximetry, re-evaluation of patient's condition and participation in multidisciplinary rounds.  Lynnell Catalan, MD [facility] ICU Physician [facility] [place] [facility]  Pager: [phone] Mobile: [phone] After hours: [phone].   05/16/2020, 11:40 AM

## 2020-05-16 NOTE — Progress Notes (Signed)
Inpatient Diabetes Program Recommendations  AACE/ADA: New Consensus Statement on Inpatient Glycemic Control (2015)  Target Ranges:  Prepandial:   less than 140 mg/dL      Peak postprandial:   less than 180 mg/dL (1-2 hours)      Critically ill patients:  140 - 180 mg/dL   Lab Results  Component Value Date   GLUCAP 222 (H) 05/16/2020   HGBA1C 9.4 (H) 05/11/2020    Review of Glycemic Control Results for Jason [name], Jason [name] (MRN [id number]) as of 05/16/2020 10:26  Ref. Range 05/15/2020 15:53 05/15/2020 19:44 05/15/2020 23:34 05/16/2020 03:43 05/16/2020 07:41  Glucose-Capillary Latest Ref Range: 70 - 99 mg/dL 947 (H) 096 (H) 283 (H) 317 (H) 222 (H)  Diabetes history:DM2 Outpatient Diabetes medications:Basaglar 20 units QHS, Humalog 2-12 units TID Current orders for Inpatient glycemic control:Novolog 0-20 units Q4H, Novolog 5 units q 4 hours, Osmolite 55 ml/hr, Lantus 20 units daily Inpatient Diabetes Program Recommendations:    Consider increasing Lantus to 25 units daily.  Consider increasing Novolog 7 units q 4 hours for tube feed coverage.   Thanks,  Beryl Meager, RN, BC-ADM Inpatient Diabetes Coordinator Pager [phone] (8a-5p)

## 2020-05-16 NOTE — Progress Notes (Signed)
At 1400, noticed CSF coming from port on the pole.  The red cap had fallen off.  With the help of Francoise Schaumann, RN, we cleaned the port with alcohol and put a new sterile cap in place.  Docia Barrier, NP was notified and will be coming to see the patient.

## 2020-05-16 NOTE — Progress Notes (Signed)
Appropriate Use Committee Chart Review  Chart reviewed by the physician advisor with input from [facility] and the attending MD as needed for review of the appropriateness for SNF referral.  TOC notes, PT/OT/ST notes, nursing notes and physician notes reviewed for medical necessity to determine if the patient's needs are appropriate for short-term rehab to return to a prior level of function versus the likely need for custodial care.  From SNF. At this time, SNF placement is appropriate.   Hillery Aldo, MD Chief Physician Advisor  05/16/2020 9:11 AM

## 2020-05-16 NOTE — Plan of Care (Signed)
PT is still intubated and sedated. VS signs stable and EVD is still draining. Will continue to monitor.

## 2020-05-17 ENCOUNTER — Inpatient Hospital Stay (HOSPITAL_COMMUNITY): Payer: Medicare HMO

## 2020-05-17 DIAGNOSIS — I615 Nontraumatic intracerebral hemorrhage, intraventricular: Secondary | ICD-10-CM | POA: Diagnosis not present

## 2020-05-17 LAB — MAGNESIUM: Magnesium: 2.2 mg/dL (ref 1.7–2.4)

## 2020-05-17 LAB — CBC
HCT: 47.8 % (ref 39.0–52.0)
Hemoglobin: 15 g/dL (ref 13.0–17.0)
MCH: 28.1 pg (ref 26.0–34.0)
MCHC: 31.4 g/dL (ref 30.0–36.0)
MCV: 89.5 fL (ref 80.0–100.0)
Platelets: 129 10*3/uL — ABNORMAL LOW (ref 150–400)
RBC: 5.34 MIL/uL (ref 4.22–5.81)
RDW: 14.1 % (ref 11.5–15.5)
WBC: 11.2 10*3/uL — ABNORMAL HIGH (ref 4.0–10.5)
nRBC: 0 % (ref 0.0–0.2)

## 2020-05-17 LAB — BASIC METABOLIC PANEL
Anion gap: 11 (ref 5–15)
BUN: 18 mg/dL (ref 8–23)
CO2: 26 mmol/L (ref 22–32)
Calcium: 8.5 mg/dL — ABNORMAL LOW (ref 8.9–10.3)
Chloride: 105 mmol/L (ref 98–111)
Creatinine, Ser: 0.65 mg/dL (ref 0.61–1.24)
GFR, Estimated: >60 mL/min (ref >60)
Glucose, Bld: 229 mg/dL — ABNORMAL HIGH (ref 70–99)
Potassium: 4.6 mmol/L (ref 3.5–5.1)
Sodium: 142 mmol/L (ref 135–145)

## 2020-05-17 LAB — GLUCOSE, CAPILLARY
Glucose-Capillary: 222 mg/dL — ABNORMAL HIGH (ref 70–99)
Glucose-Capillary: 152 mg/dL — ABNORMAL HIGH (ref 70–99)
Glucose-Capillary: 111 mg/dL — ABNORMAL HIGH (ref 70–99)
Glucose-Capillary: 111 mg/dL — ABNORMAL HIGH (ref 70–99)
Glucose-Capillary: 196 mg/dL — ABNORMAL HIGH (ref 70–99)
Glucose-Capillary: 257 mg/dL — ABNORMAL HIGH (ref 70–99)

## 2020-05-17 LAB — BRAIN NATRIURETIC PEPTIDE: B Natriuretic Peptide: 662.1 pg/mL — ABNORMAL HIGH (ref 0.0–100.0)

## 2020-05-17 LAB — PHOSPHORUS: Phosphorus: 3.1 mg/dL (ref 2.5–4.6)

## 2020-05-17 MED ORDER — FUROSEMIDE 10 MG/ML IJ SOLN
40.0000 mg | Freq: Once | INTRAMUSCULAR | Status: AC
  Administered 2020-05-17: 40 mg via INTRAVENOUS
  Filled 2020-05-17: qty 4

## 2020-05-17 MED ORDER — SODIUM CHLORIDE 0.9 % IV SOLN
1.0000 g | INTRAVENOUS | Status: DC
  Administered 2020-05-17 – 2020-05-18 (×2): 1 g via INTRAVENOUS
  Filled 2020-05-17 (×2): qty 1

## 2020-05-17 NOTE — Progress Notes (Signed)
Alerted NS provider Mcdaniels and Dr. Denese Killings that the patient was more lethargic since the drain was clamped this morning.  Dr. Denese Killings unclamped the EVD and dropped it to 12cm of H2O.  NS was notified and is in agreement with the plan.  Will continue to monitor and assess the patient accordingly.

## 2020-05-17 NOTE — Progress Notes (Addendum)
Subjective: Patient reports that he is oriented to self and is following simple commands. He was extubated yesterday. He is in NAD. No acute events overnight.   Objective: Vital signs in last 24 hours: Temp:  [98.4 F (36.9 C)-99 F (37.2 C)] 98.4 F (36.9 C) (03/02 0400) Pulse Rate:  [58-133] 67 (03/02 0700) Resp:  [12-43] 30 (03/02 0700) BP: (104-173)/(61-117) 153/66 (03/02 0700) SpO2:  [90 %-100 %] 90 % (03/02 0700) FiO2 (%):  [30 %] 30 % (03/01 0802) Weight:  [96.4 kg] 96.4 kg (03/02 0454)  Intake/Output from previous day: 03/01 0701 - 03/02 0700 In: 2643.2 [I.V.:1777.7; NG/GT:220; IV Piggyback:145.5] Out: 2065 [Urine:1900; Drains:165] Intake/Output this shift: No intake/output data recorded.   Physical Exam:Patient is intubated. He is alert but not following commands or making eye contact. Pupils 52mm and sluggish B/L. MAEW. BUE 5/5, BLE 4/5. EVD is patent with CDI dressing.   Lab Results: Recent Labs    05/16/20 0331 05/17/20 0241  WBC 8.9 11.2*  HGB 13.4 15.0  HCT 41.6 47.8  PLT 119* 129*   BMET Recent Labs    05/16/20 0331 05/17/20 0241  NA 138 142  K 4.5 4.6  CL 102 105  CO2 27 26  GLUCOSE 326* 229*  BUN 32* 18  CREATININE 1.25* 0.65  CALCIUM 8.2* 8.5*    Studies/Results: No results found.  Assessment/Plan: The patient's neurologicalstatus is stable to slightly improved as compared to yesterday. He is more alert and interactive today, although still confused. EVD draining well with drainage between 0 and 26 cc/h over last 24h. Will clamp EVD today. If patient tolerates clamping of the EVD today, we will plan to do a repeat [facility] tomorrow morning and potentially remove the EVD tomorrow afternoon. Continue supportive care.    LOS: 6 days     Council Mechanic, DNP, NP-C 05/17/2020, 7:38 AM  Head CT in AM.  Will D/C IVC if stable.

## 2020-05-17 NOTE — Evaluation (Signed)
Clinical/Bedside Swallow Evaluation Patient Details  Name: Jason [name] MRN: [id number] Date of Birth: 10-Dec-1950  Today's Date: 05/17/2020 Time: SLP Start Time (ACUTE ONLY): 0945 SLP Stop Time (ACUTE ONLY): 1020 SLP Time Calculation (min) (ACUTE ONLY): 35 min  Past Medical History:  Past Medical History:  Diagnosis Date  . Anxiety   . Coronary artery disease   . Depression   . Diabetes mellitus without complication (HCC)   . Dysrhythmia   . Hypertension   . Myocardial infarction (HCC)   . Stroke [facility])    Past Surgical History:  Past Surgical History:  Procedure Laterality Date  . CORONARY ARTERY BYPASS GRAFT     HPI:  70yo male admitted 05/11/20 with AMS. PMH: HTN, DM, CVA, CAD, MI, CABG, AFib, anxiety/depression, recent fall (02/2020) with DKA/HHS, memory impairment.  CTHead - hemorrhage with intraventricular extension and possibly mildly increased ventricular size. Intubated 2/25 - 3/1.   Assessment / Plan / Recommendation Clinical Impression  Pt seen at bedside for evaluation of swallow function and safety. Pt was sleepy initially, but cooperative with evaluation. CN exam reveals generalized oral motor weakness but no gross asymmetry. Voice quality is low in volume, "thick sounding", and intermittently wet. Pt exhibits poor/missing dentition. Oral cavity was noted to be significantly dry with dried secretions observed on hard and soft palate.   Pt was cooperative with oral care using suction, with successful removal of a moderate amount of thick mucus. Pt voice quality was significantly improved following oral care. Hard palate appeared moist, pink and healthy. Following oral care, pt accepted trials of individual ice chips x3. Extended oral prep noted, and suspect delay of swallow reflex. Delayed productive cough noted following trials of ice chips with removal of additional thick mucus. Moisturizer placed on lips following evaluation.   Recommend humidification of O2 to maximize  moisture, especially if nasal cannula is placed in pt mouth due to mouth breathing. RN and MD informed of results and recommendaitons. ST will continue to follow for readiness for PO intake.   SLP Visit Diagnosis: Dysphagia, unspecified (R13.10)    Aspiration Risk  Severe aspiration risk    Diet Recommendation Alternative means - temporary;NPO   Medication Administration: Via alternative means    Other  Recommendations Oral Care Recommendations: Oral care QID;Staff/trained caregiver to provide oral care Other Recommendations: Have oral suction available   Follow up Recommendations Other (comment) (tbd)      Frequency and Duration min 1 x/week  1 week;2 weeks       Prognosis Prognosis for Safe Diet Advancement: Good      Swallow Study   General Date of Onset: 05/11/20 HPI: 70yo male admitted 05/11/20 with AMS. PMH: HTN, DM, CVA, CAD, MI, CABG, AFib, anxiety/depression, recent fall (02/2020) with DKA/HHS, memory impairment.  CTHead - hemorrhage with intraventricular extension and possibly mildly increased ventricular size. Intubated 2/25 - 3/1. Type of Study: Bedside Swallow Evaluation Previous Swallow Assessment: BSE 03/07/20 - Dys3/thin Diet Prior to this Study: NPO Temperature Spikes Noted: No Respiratory Status: Nasal cannula History of Recent Intubation: Yes Length of Intubations (days): 4 days Date extubated: 05/16/20 Behavior/Cognition: Lethargic/Drowsy;Requires cueing;Doesn't follow directions;Cooperative Oral Cavity Assessment: Dry;Dried secretions Oral Care Completed by SLP: Yes Oral Cavity - Dentition: Poor condition;Missing dentition Vision: Functional for self-feeding Self-Feeding Abilities: Total assist Patient Positioning: Upright in bed Baseline Vocal Quality: Low vocal intensity;Wet;Hoarse Volitional Cough: Weak;Congested Volitional Swallow: Unable to elicit    Oral/Motor/Sensory Function Overall Oral Motor/Sensory Function: Generalized oral weakness    Ice  Chips Ice chips: Impaired Presentation: Spoon Oral Phase Functional Implications: Prolonged oral transit Pharyngeal Phase Impairments: Suspected delayed Swallow;Decreased hyoid-laryngeal movement;Cough - Delayed    Ashlin Hidalgo B. Murvin Natal, [facility], CCC-SLP Speech Language Pathologist Office: [phone] Pager: [phone]  Leigh Aurora 05/17/2020,10:24 AM

## 2020-05-17 NOTE — Care Management (Signed)
Transitions of Care Team following this patient for discharge planning.  Currently, patient not medically stable; will continue to follow as patient progresses.    Julie W. Amerson, RN, BSN  Trauma/Neuro ICU Case Manager [phone] 

## 2020-05-17 NOTE — Progress Notes (Signed)
NAME:  Jason [name], MRN:  [id number], DOB:  May 05, 1950, LOS: 6 ADMISSION DATE:  05/11/2020, CONSULTATION DATE:  2/25 REFERRING MD:  Dr. Iver Nestle Neurology, CHIEF COMPLAINT:  AMS  Brief History:  70 year old male admitted with hemorrhagic stroke ultimately requiring intubation for airway protection and EVD placed by neurosurgery.   History of Present Illness:  Patient is encephalopathic and/or intubated. Therefore history has been obtained from chart review.   70 year old male with PMH as below, which is significant for CAD, DM, HTN, PAF on AC, and CVA. He was last known well 2/23, and 2/24 in the morning he was found to be confused "talking out of his head". EMS was apparently called, but he was not initially transported to the emergency department for some reason. His mental status worsened and EMS was called again, this time transporting the patient to [facility] ED. In the ED he was noted to be febrile prompting an infectious workup. CT of the head showed ICH with intraventricular extension with concern for hydrocephalus. SBP on presentation 205. Theodoro Parma was given for Eliquis reversal. He was admitted by neurology to ICU where unfortunately his mental status continued to decline. PCCM was consulted for intubation at the same time neurology was consulted for ventriculostomy placement.   Past Medical History:    has a past medical history of Anxiety, Coronary artery disease, Depression, Diabetes mellitus without complication (HCC), Dysrhythmia, Hypertension, Myocardial infarction [facility]), and Stroke (HCC).   Significant Hospital Events:  2/24 admitted for [facility] 2/25 Intubated, EVD placed.  3/1 extubated 3/2 EVD clamped, however worsening mental status so reopened  Consults:  Neurosurgery  Procedures:  ETT 2/25 >3/1 EVD 2/25 >  Significant Diagnostic Tests:   CT head 2/24 > 1.2cm thalamic hemorrhage with intraventricular extension. Chronic right corona radiata lacunar insult.   CT head  2/25 > Medial left thalamic intraparenchymal hemorrhage appears the same, approximately 1 cm in size. Extension into the ventricular system with the amount of intraventricular blood being very similar. Ventriculomegaly appears the same without further dilatation.  Echocardiogram 05/12/20 estimation, is 45 to 50%.The left ventricle has no  regional wall motion abnormalities.  Micro Data:  Blood 2/24 > no growth final Urine 2/24 > no  growth final  Antimicrobials:  Rocephin 3/2-  Interim History / Subjective:  Remains on Precedex, answering questions and interactive Low-grade fever, rhonchi throughout CXR with likely worsening infiltrates so started Rocephin EVD was clamped however patient had increasing somnolence so reopened and head CT ordered by neurosurgery  Objective   Blood pressure (!) 154/72, pulse 70, temperature 99.8 F (37.7 C), temperature source Axillary, resp. rate (!) 38, height 5\' 10"  (1.778 m), weight 96.4 kg, SpO2 91 %.        Intake/Output Summary (Last 24 hours) at 05/17/2020 0858 Last data filed at 05/17/2020 0700 Gross per 24 hour  Intake 2643.23 ml  Output 2065 ml  Net 578.23 ml   Filed Weights   05/15/20 0500 05/16/20 0402 05/17/20 0454  Weight: 95.4 kg 100 kg 96.4 kg    General: Elderly, chronically ill appearing resting in bed with eyes closed, will open eyes and follow commands, no distress HEENT: MM pink/moist, pupils equal and reactive Neuro: Slightly somnolent, on Precedex but wake up and state his name, moving all extremities CV: s1s2 RRR, no m/r/g PULM: Rhonchi throughout, no hypoxia or accessory muscle use GI: soft, bsx4 active  Extremities: warm/dry, no edema  Skin: no rashes or lesions   Resolved Hospital  Problem list   Hypokalemia   Assessment & Plan:   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 Likely hypertensive in nature. Anticoagulated on Eliquis, KCentra given in ED. Hypertensive emergency Acute  hypoxemic respiratory failure: -Secondary to IPH and failure to protect airway.  Atrial fibrillation on Eliquis Converted into atrial flutter 2/25, rate 60-80 CAD HFrEF EF 45% DM2: Poorly controlled-Hgb A1C 9.4 QTC prolongation Concern for aspiration pneumonia  Plan: -Extubated yesterday, stable on nasal cannula, however with rising white count rhonchi and low-grade fever today.  Chest x-ray with airspace disease, send respiratory culture and start ceftriaxone -Lasix 40 mg x 1, Monitor urine output, creatinine 0.65 today, check BNP -Follow results of repeat head CT and continue close monitoring of neurologic status -Continue oral antihypertensives and beta-blocker with SBP goal less than 160, continue telemetry monitoring -Speech to reevaluate today, place core track  Best practice (evaluated daily)  Diet: NPO, Tube feeds at goal Pain/Anxiety/Delirium protocol (if indicated): Stop all sedation now that extubated VAP protocol (if indicated): Not extubate DVT prophylaxis: SCD-start prophylactic dose heparin GI prophylaxis: Discontinue now that extubated Glucose control: SSI Mobility: PT OT, SLP Disposition:FULL  Goals of Care:  Last date of multidisciplinary goals of care discussion: per primary Family and staff present:  Summary of discussion:  Follow up goals of care discussion due: 3/4 Code Status: FULL  Labs   CBC: Recent Labs  Lab 05/12/20 0415 05/13/20 0431 05/14/20 0526 05/15/20 0251 05/16/20 0331 05/17/20 0241  WBC 9.6 7.3 7.4 8.5 8.9 11.2*  NEUTROABS 7.7 4.6 4.8 6.1 6.6  --   HGB 15.0 14.1 14.0 13.3 13.4 15.0  HCT 46.3 43.3 42.4 42.5 41.6 47.8  MCV 85.9 85.9 87.4 89.9 88.1 89.5  PLT 161 PLATELET CLUMPS NOTED ON SMEAR, COUNT APPEARS ADEQUATE 106* 109* 119* 129*    Basic Metabolic Panel: Recent Labs  Lab 05/12/20 1710 05/13/20 0431 05/13/20 1721 05/14/20 0526 05/15/20 0251 05/16/20 0331 05/17/20 0241  NA  --  139  --  140 140 138 142  K  --  3.4*  --   4.5 4.4 4.5 4.6  CL  --  106  --  106 108 102 105  CO2  --  25  --  25 25 27 26   GLUCOSE  --  123*  --  322* 169* 326* 229*  BUN  --  9  --  12 15 32* 18  CREATININE  --  0.82  --  0.66 0.88 1.25* 0.65  CALCIUM  --  8.4*  --  8.3* 8.2* 8.2* 8.5*  MG 2.4 1.9 2.1 2.1  --   --  2.2  PHOS 3.3 3.4 3.2 3.9  --   --  3.1   GFR: Estimated Creatinine Clearance: 98.7 mL/min (by C-G formula based on SCr of 0.65 mg/dL). Recent Labs  Lab 05/11/20 1936 05/12/20 0415 05/14/20 0526 05/15/20 0251 05/16/20 0331 05/17/20 0241  WBC 9.2   < > 7.4 8.5 8.9 11.2*  LATICACIDVEN 1.6  --   --   --   --   --    < > = values in this interval not displayed.    Liver Function Tests: Recent Labs  Lab 05/12/20 0415 05/13/20 0431 05/14/20 0526 05/15/20 0251 05/16/20 0331  AST 25 18 10* 10* 14*  ALT 25 12 12 10 9   ALKPHOS 113 94 90 79 70  BILITOT 1.1 1.1 0.5 0.5 0.6  PROT 6.3* 5.4* 5.1* 5.2* 5.5*  ALBUMIN 3.2* 2.6* 2.3* 2.3* 2.3*  No results for input(s): LIPASE, AMYLASE in the last 168 hours. Recent Labs  Lab 05/11/20 1936  AMMONIA 13    ABG    Component Value Date/Time   PHART 7.477 (H) 05/12/2020 0413   PCO2ART 34.1 05/12/2020 0413   PO2ART 113 (H) 05/12/2020 0413   HCO3 25.1 05/12/2020 0413   TCO2 26 05/12/2020 0413   ACIDBASEDEF 15.0 (H) 02/26/2020 1450   O2SAT 99.0 05/12/2020 0413     Coagulation Profile: Recent Labs  Lab 05/11/20 2018 05/13/20 1721  INR 1.1 1.1    Cardiac Enzymes: No results for input(s): CKTOTAL, CKMB, CKMBINDEX, TROPONINI in the last 168 hours.  HbA1C: Hgb A1c MFr Bld  Date/Time Value Ref Range Status  05/11/2020 11:40 PM 9.4 (H) 4.8 - 5.6 % Final    Comment:    (NOTE) Pre diabetes:          5.7%-6.4%  Diabetes:              >6.4%  Glycemic control for   <7.0% adults with diabetes   02/26/2020 07:43 PM 12.8 (H) 4.8 - 5.6 % Final    Comment:    (NOTE) Pre diabetes:          5.7%-6.4%  Diabetes:              >6.4%  Glycemic control for    <7.0% adults with diabetes     CBG: Recent Labs  Lab 05/16/20 1543 05/16/20 1945 05/16/20 2331 05/17/20 0349 05/17/20 0818  GLUCAP 166* 183* 229* 222* 152*    CRITICAL CARE Performed by: Darcella Gasman Tessy Pawelski   Total critical care time: 40 minutes  Critical care time was exclusive of separately billable procedures and treating other patients.  Critical care was necessary to treat or prevent imminent or life-threatening deterioration.  Critical care was time spent personally by me on the following activities: development of treatment plan with patient and/or surrogate as well as nursing, discussions with consultants, evaluation of patient's response to treatment, examination of patient, obtaining history from patient or surrogate, ordering and performing treatments and interventions, ordering and review of laboratory studies, ordering and review of radiographic studies, pulse oximetry and re-evaluation of patient's condition.    Darcella Gasman Clarence Dunsmore, PA-C [facility] See Amion for pager If no response to pager , please call [phone] until 7pm After 7:00 pm call Elink  [phone]

## 2020-05-17 NOTE — Progress Notes (Signed)
Wasted fentanyl from bag into sink with Sherral Hammers, RN

## 2020-05-17 NOTE — Progress Notes (Signed)
STROKE TEAM PROGRESS NOTE   INTERVAL HISTORY  EVD clamped today.  Patient became increasingly more somnolent.  Failed clamping trial.  Ventric drain open at 12cm.  Blood pressure adequately controlled. Tmax 37.8. Extubated yesterday.  Neurological exam mostly unchanged opens eyes to voice, blinks to threat, sticks out tongue, moves all extremities to command.    Failed swallow eval today.   No visitors at bedside.   Vitals:   05/17/20 1200 05/17/20 1300 05/17/20 1400 05/17/20 1500  BP: (!) 136/57 (!) 156/60 (!) 148/65 139/66  Pulse: (!) 138 81 67 68  Resp: (!) 40 (!) 31 (!) 42 (!) 24  Temp: 100.1 F (37.8 C)     TempSrc: Axillary     SpO2: 93% 95% 100% 99%  Weight:      Height:       CBC:  Recent Labs  Lab 05/15/20 0251 05/16/20 0331 05/17/20 0241  WBC 8.5 8.9 11.2*  NEUTROABS 6.1 6.6  --   HGB 13.3 13.4 15.0  HCT 42.5 41.6 47.8  MCV 89.9 88.1 89.5  PLT 109* 119* 129*   Basic Metabolic Panel:  Recent Labs  Lab 05/14/20 0526 05/15/20 0251 05/16/20 0331 05/17/20 0241  NA 140   < > 138 142  K 4.5   < > 4.5 4.6  CL 106   < > 102 105  CO2 25   < > 27 26  GLUCOSE 322*   < > 326* 229*  BUN 12   < > 32* 18  CREATININE 0.66   < > 1.25* 0.65  CALCIUM 8.3*   < > 8.2* 8.5*  MG 2.1  --   --  2.2  PHOS 3.9  --   --  3.1   < > = values in this interval not displayed.   Lipid Panel:  Recent Labs  Lab 05/11/20 2348  CHOL 141  TRIG 54  HDL 46  CHOLHDL 3.1  VLDL 11  LDLCALC 84   HgbA1c:  Recent Labs  Lab 05/11/20 2340  HGBA1C 9.4*   Urine Drug Screen:  Recent Labs  Lab 05/12/20 0432  LABOPIA NONE DETECTED  COCAINSCRNUR NONE DETECTED  LABBENZ NONE DETECTED  AMPHETMU NONE DETECTED  THCU NONE DETECTED  LABBARB NONE DETECTED    Alcohol Level No results for input(s): ETH in the last 168 hours.  IMAGING past 24 hours EEG adult  Result Date: 05/14/2020  IMPRESSION: This study is suggestive of moderate to evere diffuse encephalopathy, nonspecific etiology.  No seizures or epileptiform discharges were seen throughout the recording.  PHYSICAL EXAM  Temp:  [98.4 F (36.9 C)-100.1 F (37.8 C)] 100.1 F (37.8 C) (03/02 1200) Pulse Rate:  [58-138] 68 (03/02 1500) Resp:  [23-42] 24 (03/02 1500) BP: (125-160)/(52-117) 139/66 (03/02 1500) SpO2:  [90 %-100 %] 99 % (03/02 1500) Weight:  [96.4 kg] 96.4 kg (03/02 0454)  General - Well nourished, well developed elderly male not in distress  Ophthalmologic - fundi not visualized due to noncooperation.  Cardiovascular -A. fib flutter with irregularly irregular heart rate and rhythm.  Neuro -  Patient is drowsy but can be aroused..  Follows simple commands.  Left gaze preference unable to look to the right of midline.  Blinks to threat on the left but not on the right.  Right facial weakness.  Tongue midline.  Is able to move all 4 extremities against gravity left side slightly more than right.  No focal weakness.  ASSESSMENT/PLAN [name] is a 70 y.o.  male with history of HTN, DM, CAD/MI, afib on eliquis presenting with AMS, garbled speech, N/V and behavior change.   ICH - left thalamic ICH with IVH, likely due to HTN in the setting of eliquis use  3/2 failed clamping trial 3/1 EVD weaned to 20 cm H20, ICPs stable 2/27 Neurosurgery instilled tPA into EVD 2/26 Updated Head CT State with decline in responsiveness. Extension of IVH noted. Exam improved on less fentanyl but not back to following commands.    CT left thalamic ICH with IVH, no hydrocephalus  Repeat CT head 2/25 stable hematoma and IVH   Repeat CT head 2/26 Medial left thalamic intraparenchymal hemorrhage appearssimilar in size measuring approximately 1 cm on series 19, image 3,with increased volume of intraventricular blood extending from the area of hemorrhage. Including filling both the third ventricle with layering blood in the fourth and lateral ventricle  2D Echo EF 45-50%, No shunt  LDL 84  HgbA1c 9.4  VTE  prophylaxis - SCDs, Lovenox 40mg  daily  Eliquis (apixaban) daily prior to admission, now on No antithrombotic.   No anticoagulant or antiplatelet medications  Therapy recommendations:  PT pending, OT SNF  Disposition:  Pending   Hypertensive emergency  Home meds:  Lotensin, metoprolol  Stable  On Benazepril 40mg  daily, metoprolol 50mg  bid . SBP goal to <160 . Hydralazine 20mg  q6hr prn, labetalol 10mg  q2 hr prn . 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 Patient deemed not a candidate for chronic AC. Will leave in a fluttter. QT prolongation does not apply.   Respiratory failure  Extubated  CCM on board and appreciated  Extubate as able  Dysphagia  Likely due to hemorrhagic stroke  Failed swallow eval  cortrac for feeding and meds Hyperlipidemia  Home meds:  crestor 20  LDL 84, goal < 70  May continue statin at discharge  Diabetes type II Uncontrolled  Home meds:  insulin  HgbA1c 9.4, goal < 7.0  CBGs improving 111-222  SSI , Lantus to 20u daily and Novolog 7 units subq q 4 hrs scheduled  Appreciate Diabetes Coordinator recommendations  Elevated Creatinine  Creatinine 1.25 -> 0.65   Lasix given for diuresis   Adequate uop  NS 29ml/hr   Other Stroke Risk Factors  Advanced Age >/= 37   Hx stroke/TIA  Coronary artery disease and MI  Leukocytosis  WBC 8.9 -> 11.2  tmax 37.8  Continue to monitor for s/s of infection  Patient failed trial of clamping ventriculostomy today due to depressed mental status.  Continue ventriculostomy drainage as per neurosurgery.  Continue strict control of hypertension.  Discussed with Dr. neurosurgery This patient is critically ill and at significant risk of neurological worsening, death and care requires constant  monitoring of vital signs, hemodynamics,respiratory and cardiac monitoring, extensive review of multiple databases, frequent neurological assessment, discussion with family, other specialists and medical decision making of high complexity.I have made any additions or clarifications directly to the above note.This critical care time does not reflect procedure time, or teaching time or supervisory time of PA/NP/Med Resident etc but could involve care discussion time.  I spent 30 minutes of neurocritical care time  in the care of  this patient.    , MD  To contact Stroke Continuity provider, please refer to . After hours, contact General Neurology

## 2020-05-17 NOTE — Progress Notes (Signed)
Physical Therapy Treatment Patient Details Name: Jason [name] MRN: [id number] DOB: 19-Jan-1951 Today's Date: 05/17/2020    History of Present Illness 70 yo male presenting with AMS. 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 Pt s/p R frontal IVC placement, ETT 2/25-3/1. Pt with recent admission after being found down at home with DKA. Other PMH includes: DM, HTN, CVA, CABG, afib with rvr, anxiety, and depression.    PT Comments    Pt drowsy but awake upon PT arrival to room. RN clamped EVD prior to mobility, pt not complaining of any headache-type pain for duration of session. Pt tolerated EOB sitting tasks x10 minutes, pt demonstrating poor truncal control and strength as well as tachypnea with exertion. Pt following commands ~50% of the time at this time, no focal weakness observed but formal assessment is difficult given pt cognition. PT continuing to recommend SNF level of care post-acutely, will continue to follow.     Follow Up Recommendations  SNF     Equipment Recommendations  Hospital bed    Recommendations for Other Services       Precautions / Restrictions Precautions Precautions: Fall;Other (comment) (Intubated, restlesss, sedated) Precaution Comments: IVD-needs to be clamped prior to moving pt or the bed, BP<160 systolic Restrictions Weight Bearing Restrictions: No    Mobility  Bed Mobility Overal bed mobility: Needs Assistance Bed Mobility: Rolling;Supine to Sit;Sit to Supine Rolling: Total assist;+2 for physical assistance   Supine to sit: Total assist;+2 for physical assistance Sit to supine: +2 for physical assistance;Total assist   General bed mobility comments: total +2 for supine<>sit for trunk and LE management, scooting to and from EOB, and boost up in bed upon return to supine. Very increased time, performed step-wise and frequent cuing needed throughout.    Transfers                 General transfer comment:  unable, pt fatiguing from EOB sitting  Ambulation/Gait             General Gait Details: nt   Social research officer, government Rankin (Stroke Patients Only) Modified Rankin (Stroke Patients Only) Pre-Morbid Rankin Score: Moderate disability Modified Rankin: Severe disability     Balance Overall balance assessment: Needs assistance Sitting-balance support: Bilateral upper extremity supported;Feet supported Sitting balance-Leahy Scale: Poor Sitting balance - Comments: posterior and R lateral leaning with fatigue, requires min-mod assist posteriorly throughout. EOB sitting x10 minutes. Seated interventions include righting reactions with decreased posterior support, lateral elbow propping>return to upright bilaterally x2 each way. Postural control: Posterior lean;Right lateral lean     Standing balance comment: nt                            Cognition Arousal/Alertness: Awake/alert (drowsy) Behavior During Therapy: Flat affect Overall Cognitive Status: Impaired/Different from baseline Area of Impairment: Orientation;Attention;Following commands;Safety/judgement;Problem solving;Awareness                 Orientation Level: Disoriented to;Place;Time;Situation Current Attention Level: Focused   Following Commands: Follows one step commands inconsistently Safety/Judgement: Decreased awareness of deficits;Decreased awareness of safety Awareness: Intellectual Problem Solving: Slow processing;Decreased initiation;Difficulty sequencing;Requires verbal cues;Requires tactile cues General Comments: Pt states "I am in the basement of a hospital, it's 1990", and unable to state procedure. Pt follows one-step command inconsistently, ~50% of the time. Pt is drowsy for a majority of session, but  awake for duration.      Exercises      General Comments General comments (skin integrity, edema, etc.): SpO2 >92% throughout on 6LO2, RRmax 43  breaths/min during mobility. SBP 148 when assessed during EOB sitting.      Pertinent Vitals/Pain Pain Assessment: Faces Faces Pain Scale: Hurts little more Pain Location: Generalized, during mobility Pain Descriptors / Indicators: Grimacing Pain Intervention(s): Limited activity within patient's tolerance;Monitored during session;Repositioned    Home Living                      Prior Function            PT Goals (current goals can now be found in the care plan section) Acute Rehab PT Goals Patient Stated Goal: none stated PT Goal Formulation: With patient Time For Goal Achievement: 05/29/20 Potential to Achieve Goals: Good Progress towards PT goals: Progressing toward goals    Frequency    Min 3X/week      PT Plan Current plan remains appropriate    Co-evaluation              AM-PAC PT "6 Clicks" Mobility   Outcome Measure  Help needed turning from your back to your side while in a flat bed without using bedrails?: Total Help needed moving from lying on your back to sitting on the side of a flat bed without using bedrails?: Total Help needed moving to and from a bed to a chair (including a wheelchair)?: Total Help needed standing up from a chair using your arms (e.g., wheelchair or bedside chair)?: Total Help needed to walk in hospital room?: Total Help needed climbing 3-5 steps with a railing? : Total 6 Click Score: 6    End of Session Equipment Utilized During Treatment: Oxygen Activity Tolerance: Patient limited by fatigue Patient left: in bed;with call bell/phone within reach;with nursing/sitter in room (RN preparing to give pt a bath, alarm and wrist restraints left off for this reason, RN aware) Nurse Communication: Mobility status;Other (comment) (BP) PT Visit Diagnosis: Muscle weakness (generalized) (M62.81);Difficulty in walking, not elsewhere classified (R26.2)     Time: 1410-1434 PT Time Calculation (min) (ACUTE ONLY): 24  min  Charges:  $Therapeutic Activity: 8-22 mins $Neuromuscular Re-education: 8-22 mins                     Marye Round, PT Acute Rehabilitation Services Pager [phone]  Office [phone]   Tyrone Apple E Christain [place] 05/17/2020, 4:14 PM

## 2020-05-18 ENCOUNTER — Inpatient Hospital Stay (HOSPITAL_COMMUNITY): Payer: Medicare HMO

## 2020-05-18 DIAGNOSIS — E870 Hyperosmolality and hypernatremia: Secondary | ICD-10-CM | POA: Diagnosis not present

## 2020-05-18 DIAGNOSIS — G9340 Encephalopathy, unspecified: Secondary | ICD-10-CM

## 2020-05-18 DIAGNOSIS — R400 Somnolence: Secondary | ICD-10-CM | POA: Diagnosis not present

## 2020-05-18 DIAGNOSIS — J9601 Acute respiratory failure with hypoxia: Secondary | ICD-10-CM | POA: Diagnosis not present

## 2020-05-18 LAB — COMPREHENSIVE METABOLIC PANEL
ALT: 16 U/L (ref 0–44)
AST: 40 U/L (ref 15–41)
Albumin: 2.2 g/dL — ABNORMAL LOW (ref 3.5–5.0)
Alkaline Phosphatase: 70 U/L (ref 38–126)
Anion gap: 10 (ref 5–15)
BUN: 16 mg/dL (ref 8–23)
CO2: 26 mmol/L (ref 22–32)
Calcium: 8.2 mg/dL — ABNORMAL LOW (ref 8.9–10.3)
Chloride: 108 mmol/L (ref 98–111)
Creatinine, Ser: 0.72 mg/dL (ref 0.61–1.24)
GFR, Estimated: >60 mL/min (ref >60)
Glucose, Bld: 184 mg/dL — ABNORMAL HIGH (ref 70–99)
Potassium: 3.4 mmol/L — ABNORMAL LOW (ref 3.5–5.1)
Sodium: 144 mmol/L (ref 135–145)
Total Bilirubin: 0.6 mg/dL (ref 0.3–1.2)
Total Protein: 5.9 g/dL — ABNORMAL LOW (ref 6.5–8.1)

## 2020-05-18 LAB — EXPECTORATED SPUTUM ASSESSMENT W GRAM STAIN, RFLX TO RESP C

## 2020-05-18 LAB — GLUCOSE, CAPILLARY
Glucose-Capillary: 130 mg/dL — ABNORMAL HIGH (ref 70–99)
Glucose-Capillary: 141 mg/dL — ABNORMAL HIGH (ref 70–99)
Glucose-Capillary: 163 mg/dL — ABNORMAL HIGH (ref 70–99)
Glucose-Capillary: 192 mg/dL — ABNORMAL HIGH (ref 70–99)
Glucose-Capillary: 193 mg/dL — ABNORMAL HIGH (ref 70–99)
Glucose-Capillary: 234 mg/dL — ABNORMAL HIGH (ref 70–99)

## 2020-05-18 LAB — CBC WITH DIFFERENTIAL/PLATELET
Abs Immature Granulocytes: 0.04 10*3/uL (ref 0.00–0.07)
Basophils Absolute: 0.1 10*3/uL (ref 0.0–0.1)
Basophils Relative: 1 %
Eosinophils Absolute: 0 10*3/uL (ref 0.0–0.5)
Eosinophils Relative: 0 %
HCT: 46.1 % (ref 39.0–52.0)
Hemoglobin: 14.7 g/dL (ref 13.0–17.0)
Immature Granulocytes: 1 %
Lymphocytes Relative: 12 %
Lymphs Abs: 1 10*3/uL (ref 0.7–4.0)
MCH: 27.9 pg (ref 26.0–34.0)
MCHC: 31.9 g/dL (ref 30.0–36.0)
MCV: 87.6 fL (ref 80.0–100.0)
Monocytes Absolute: 1.3 10*3/uL — ABNORMAL HIGH (ref 0.1–1.0)
Monocytes Relative: 15 %
Neutro Abs: 6.1 10*3/uL (ref 1.7–7.7)
Neutrophils Relative %: 71 %
Platelets: 166 10*3/uL (ref 150–400)
RBC: 5.26 MIL/uL (ref 4.22–5.81)
RDW: 13.9 % (ref 11.5–15.5)
WBC: 8.4 10*3/uL (ref 4.0–10.5)
nRBC: 0 % (ref 0.0–0.2)

## 2020-05-18 MED ORDER — FUROSEMIDE 10 MG/ML IJ SOLN
40.0000 mg | Freq: Four times a day (QID) | INTRAMUSCULAR | Status: AC
  Administered 2020-05-18 (×2): 40 mg via INTRAVENOUS
  Filled 2020-05-18 (×2): qty 4

## 2020-05-18 MED ORDER — SODIUM CHLORIDE 0.9 % IV SOLN: 2.0000 g | INTRAVENOUS | Status: DC

## 2020-05-18 MED ORDER — INSULIN GLARGINE 100 UNIT/ML ~~LOC~~ SOLN
20.0000 [IU] | Freq: Two times a day (BID) | SUBCUTANEOUS | Status: DC
  Administered 2020-05-18 – 2020-05-19 (×2): 20 [IU] via SUBCUTANEOUS
  Filled 2020-05-18 (×3): qty 0.2

## 2020-05-18 MED ORDER — DILTIAZEM HCL 60 MG PO TABS
60.0000 mg | ORAL_TABLET | Freq: Four times a day (QID) | ORAL | Status: DC
  Administered 2020-05-18 – 2020-05-22 (×17): 60 mg
  Filled 2020-05-18 (×18): qty 1

## 2020-05-18 MED ORDER — DILTIAZEM HCL 60 MG PO TABS
60.0000 mg | ORAL_TABLET | Freq: Four times a day (QID) | ORAL | Status: DC
  Filled 2020-05-18: qty 1

## 2020-05-18 MED ORDER — JEVITY 1.5 CAL/FIBER PO LIQD
1000.0000 mL | ORAL | Status: DC
  Administered 2020-05-18 – 2020-06-03 (×14): 1000 mL
  Filled 2020-05-18 (×26): qty 1000

## 2020-05-18 MED ORDER — PIPERACILLIN-TAZOBACTAM 3.375 G IVPB
3.3750 g | Freq: Three times a day (TID) | INTRAVENOUS | Status: DC
  Administered 2020-05-18 – 2020-05-23 (×15): 3.375 g via INTRAVENOUS
  Filled 2020-05-18 (×16): qty 50

## 2020-05-18 MED ORDER — POTASSIUM CHLORIDE 20 MEQ PO PACK
40.0000 meq | PACK | Freq: Once | ORAL | Status: AC
  Administered 2020-05-18: 40 meq
  Filled 2020-05-18: qty 2

## 2020-05-18 MED ORDER — DILTIAZEM HCL 60 MG PO TABS
60.0000 mg | ORAL_TABLET | Freq: Four times a day (QID) | ORAL | Status: DC
  Filled 2020-05-18 (×2): qty 1

## 2020-05-18 NOTE — Progress Notes (Signed)
STROKE TEAM PROGRESS NOTE   INTERVAL HISTORY  Febrile to 101. CXR with worsening bilateral airspace opacities left greater than right EVD clamping trial not tolerated yesterday.  But today neurosurgery has reduced the pop-off to 10 Tolerating extubation with some tachypnea. On 4LNC.  Mental status slightly improved, More alert and following commands more reliably.  No visitors at bedside.   Vitals:   05/18/20 0900 05/18/20 1000 05/18/20 1100 05/18/20 1200  BP: (!) 155/64 (!) 156/69 (!) 156/82 (!) 159/95  Pulse: 95 88 70 88  Resp: (!) 32 (!) 47 (!) 24 (!) 39  Temp:    97.6 F (36.4 C)  TempSrc:    Oral  SpO2: 100% 98% 96% 97%  Weight:      Height:       CBC:  Recent Labs  Lab 05/16/20 0331 05/17/20 0241 05/18/20 0920  WBC 8.9 11.2* 8.4  NEUTROABS 6.6  --  6.1  HGB 13.4 15.0 14.7  HCT 41.6 47.8 46.1  MCV 88.1 89.5 87.6  PLT 119* 129* 166   Basic Metabolic Panel:  Recent Labs  Lab 05/14/20 0526 05/15/20 0251 05/17/20 0241 05/18/20 0920  NA 140   < > 142 144  K 4.5   < > 4.6 3.4*  CL 106   < > 105 108  CO2 25   < > 26 26  GLUCOSE 322*   < > 229* 184*  BUN 12   < > 18 16  CREATININE 0.66   < > 0.65 0.72  CALCIUM 8.3*   < > 8.5* 8.2*  MG 2.1  --  2.2  --   PHOS 3.9  --  3.1  --    < > = values in this interval not displayed.   Lipid Panel:  Recent Labs  Lab 05/11/20 2348  CHOL 141  TRIG 54  HDL 46  CHOLHDL 3.1  VLDL 11  LDLCALC 84   HgbA1c:  Recent Labs  Lab 05/11/20 2340  HGBA1C 9.4*   Urine Drug Screen:  Recent Labs  Lab 05/12/20 0432  LABOPIA NONE DETECTED  COCAINSCRNUR NONE DETECTED  LABBENZ NONE DETECTED  AMPHETMU NONE DETECTED  THCU NONE DETECTED  LABBARB NONE DETECTED    Alcohol Level No results for input(s): ETH in the last 168 hours.  IMAGING past 24 hours EEG adult  Result Date: 05/14/2020  IMPRESSION: This study is suggestive of moderate to evere diffuse encephalopathy, nonspecific etiology. No seizures or epileptiform  discharges were seen throughout the recording.  PHYSICAL EXAM  Temp:  [97.6 F (36.4 C)-101.1 F (38.4 C)] 97.6 F (36.4 C) (03/03 1200) Pulse Rate:  [44-102] 88 (03/03 1200) Resp:  [24-61] 39 (03/03 1200) BP: (122-176)/(64-112) 159/95 (03/03 1200) SpO2:  [91 %-100 %] 97 % (03/03 1200) Weight:  [96.5 kg] 96.5 kg (03/03 0437)  General - Well nourished, well developed elderly male not in distress  Ophthalmologic - fundi not visualized due to noncooperation.  Cardiovascular -A. fib flutter with irregularly irregular heart rate and rhythm.  Neuro -  Patient is drowsy but can be aroused..  Follows simple commands.  Left gaze preference unable to look to the right of midline.  Blinks to threat on the left but not on the right.  Right facial weakness.  Tongue midline.  Is able to move all 4 extremities against gravity left side slightly more than right.  No focal weakness.  ASSESSMENT/PLAN [name] is a 70 y.o. male with history of HTN, DM, CAD/MI, afib  on eliquis presenting with AMS, garbled speech, N/V and behavior change.   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 Extension of IVH noted. Exam improved on less fentanyl but not back to following commands.    CT left thalamic ICH with IVH, no hydrocephalus  Repeat CT head 2/25 stable hematoma and IVH   Repeat CT head 2/26 Medial left thalamic intraparenchymal hemorrhage appearssimilar in size measuring approximately 1 cm on series 19, image 3,with increased volume of intraventricular blood extending from the area of hemorrhage. Including filling both the third ventricle with layering blood in the fourth and lateral  ventricle  2D Echo EF 45-50%, No shunt  LDL 84  HgbA1c 9.4  VTE prophylaxis - SCDs, Lovenox 40mg  daily  Eliquis (apixaban) daily prior to admission, now on No antithrombotic.   No anticoagulant or antiplatelet medications  Therapy recommendations:  PT pending, OT SNF  Disposition:  Pending   Hypertensive emergency  Home meds:  Lotensin, metoprolol  Stable  On Benazepril 40mg  daily, metoprolol 50mg  bid . SBP goal to <160 . Hydralazine 20mg  q6hr prn, labetalol 10mg  q2 hr prn . Long-term BP goal normotensive  Afib/Aflutter on AC  Was on eliquis PTA  Reversed by Kcentra  No antithrombotics due to ICH  Continue metoprolol for rate control  Cardiology consult appreciated: Rate control adequate. Patient deemed not a candidate for chronic AC at this time. Will leave in A fluttter. QT prolongation does not apply.   Respiratory failure and LLL Pneumonia  Extubated 3/1, vomited post extubation  CCM on board and appreciated  Empiric antibiotics Zosyn, MRSA swab to assess if MRSA coverage as needed per CCM  Dysphagia  Likely due to hemorrhagic stroke  Failed swallow eval  cortrac for feeding and meds  Hyperlipidemia  Home meds:  crestor 20  LDL 84, goal < 70  May continue statin at discharge  Diabetes type II Uncontrolled  Home meds:  insulin  HgbA1c 9.4, goal < 7.0  CBGs improving 111-222  SSI , Lantus to 20u BID and SSI  Appreciate Diabetes Coordinator recommendations  Elevated Creatinine  Creatinine 1.25 -> 0.65   Lasix given for diuresis   Adequate uop  NS 43ml/hr  Other Stroke Risk Factors  Advanced Age >/= 48   Hx stroke/TIA  Coronary artery disease and MI  Leukocytosis  WBC 8.9 -> 11.2->8.4  tmax 100   Continue to monitor for s/s of infection  Continue management of respiratory status as per critical care team.  Neurosurgery managing ventriculostomy.  Continue strict blood pressure control.  No neurological changes.   Discussed with Dr. critical care medicine This patient is critically ill and at significant risk of neurological worsening, death and care requires constant monitoring of vital signs, hemodynamics,respiratory and cardiac monitoring, extensive review of multiple databases, frequent neurological assessment, discussion with family, other specialists and medical decision making of high complexity.I have made any additions or clarifications directly to the above note.This critical care time does not reflect procedure time, or teaching time or supervisory time of PA/NP/Med Resident etc but could involve care discussion time.  I spent 30 minutes of neurocritical care time  in the care of  this  patient.  Delia Heady, MD  To contact Stroke Continuity provider, please refer to [email or web address]. After hours, contact General Neurology

## 2020-05-18 NOTE — Progress Notes (Incomplete)
Nutrition Follow-up  DOCUMENTATION CODES:   Not applicable  INTERVENTION:   Tube feeding via Cortrak tube: Osmolite 1.5 at 50 ml/h (1200 ml per day) Prosource TF 45 ml TID  Provides 1920 kcal, 108 gm protein, 912 ml free water daily    NUTRITION DIAGNOSIS:   Inadequate oral intake related to inability to eat as evidenced by NPO status. Ongoing.   GOAL:   Patient will meet greater than or equal to 90% of their needs Met with TF.   MONITOR:   TF tolerance  REASON FOR ASSESSMENT:   Consult,Ventilator Enteral/tube feeding initiation and management  ASSESSMENT:   Pt with PMH of anxiety, depression, DM, HTN, stroke, and MI admitted with ICH likely due to HTN.    Pt discussed during ICU rounds and with RN.  Resumed TF after cortrak placement   2/25 s/p EVD and intubation  3/1 extubated  Pt discussed during ICU rounds and with RN.   Medications reviewed and include: novolog, SS, lantus, miralax, senokot-s   Precedex  Labs reviewed:  CBG's: 800-349   OG tip in stomach  EVD: 4 ml   Diet Order:   Diet Order            Diet NPO time specified  Diet effective now                 EDUCATION NEEDS:   No education needs have been identified at this time  Skin:  Skin Assessment: Reviewed RN Assessment  Last BM:  unknown  Height:   Ht Readings from Last 1 Encounters:  05/12/20 $RemoveB'5\' 10"'zNUhkutZ$  (1.778 m)    Weight:   Wt Readings from Last 1 Encounters:  05/18/20 96.5 kg    Ideal Body Weight:     BMI:  Body mass index is 30.53 kg/m.  Estimated Nutritional Needs:   Kcal:  2000  Protein:  105-120 grams  Fluid:  >2 L/day  Lockie Pares., RD, LDN, CNSC See AMiON for contact information

## 2020-05-18 NOTE — Procedures (Signed)
Cortrak  Tube Type:  Cortrak - 43 inches Tube Location:  Left nare Initial Placement:  Stomach Secured by: Bridle Technique Used to Measure Tube Placement:  Documented cm marking at nare/ corner of mouth Cortrak Secured At:  69 cm    Cortrak Tube Team Note:  Consult received to place a Cortrak feeding tube.   X-ray is required, abdominal x-ray has been ordered by the Cortrak team. Please confirm tube placement before using the Cortrak tube.   If the tube becomes dislodged please keep the tube and contact the Cortrak team at [email or web address] (password [email or web address]) for replacement.  If after hours and replacement cannot be delayed, place a NG tube and confirm placement with an abdominal x-ray.   Betsey Holiday MS, RD, LDN Please refer to [facility] for RD and/or RD on-call/weekend/after hours pager

## 2020-05-18 NOTE — Progress Notes (Signed)
Subjective: Patient is oriented to self and following simple commands. He was extubated yesterday. He is in NAD. No acute events overnight.   Objective: Vital signs in last 24 hours: Temp:  [99.2 F (37.3 C)-101.1 F (38.4 C)] 101.1 F (38.4 C) (03/03 0800) Pulse Rate:  [44-138] 88 (03/03 1000) Resp:  [24-61] 47 (03/03 1000) BP: (122-176)/(57-112) 156/69 (03/03 1000) SpO2:  [91 %-100 %] 98 % (03/03 1000) Weight:  [96.5 kg] 96.5 kg (03/03 0437)  Intake/Output from previous day: 03/02 0701 - 03/03 0700 In: 2978.8 [I.V.:2163.8; NG/GT:715; IV Piggyback:100] Out: 1608 [Urine:1500; Drains:108] Intake/Output this shift: Total I/O In: 90 [I.V.:90] Out: 14 [Drains:14]  Physical Exam:Patient is extubated. Heis alert and following simple commands . Pupils 72mm and sluggish B/L. MAEW. BUE 4/5, BLE 4/5. EVD is patent with CDI dressing.   Lab Results: Recent Labs    05/17/20 0241 05/18/20 0920  WBC 11.2* 8.4  HGB 15.0 14.7  HCT 47.8 46.1  PLT 129* 166   BMET Recent Labs    05/16/20 0331 05/17/20 0241  NA 138 142  K 4.5 4.6  CL 102 105  CO2 27 26  GLUCOSE 326* 229*  BUN 32* 18  CREATININE 1.25* 0.65  CALCIUM 8.2* 8.5*    Studies/Results: CT HEAD WO CONTRAST  Result Date: 05/18/2020 CLINICAL DATA:  Intracranial hemorrhage, follow-up examination EXAM: CT HEAD WITHOUT CONTRAST TECHNIQUE: Contiguous axial images were obtained from the base of the skull through the vertex without intravenous contrast. COMPARISON:  05/13/2020 FINDINGS: Brain: Intraventricular clot within the third ventricle as well as intraparenchymal hematoma within the medial left thalamus have resolved. Layering intraventricular hemorrhage is again seen with the occipital horns of the a lateral ventricles bilaterally. Ventricular size is stable. Right frontal ventriculostomy is again identified with its tip in the region of the foramina [place]. No interval hemorrhage. Parenchymal volume loss is commensurate  with the patient's age. Moderate periventricular white matter changes are present. Remote lacunar infarct noted within the right corona radiata and left subinsular white matter. No extra-axial fluid collection identified. Cerebellum is unremarkable. Vascular: No asymmetric hyperdense vasculature at the skull base. Skull: Intact Sinuses/Orbits: There is opacification of the right sphenoid sinus. Remaining paranasal sinuses are clear. Orbits are unremarkable. Other: Mastoid air cells and middle ear cavities are clear. IMPRESSION: Interval resolution of intraventricular clot within the third ventricle and intraparenchymal hematoma within the left thalamus. Stable volume intraventricular hemorrhage within the occipital horns bilaterally. Right ventriculostomy in place.  Stable ventricular size. Stable senescent changes with bilateral remote infarcts as outlined above. Electronically Signed   By: Helyn Numbers MD   On: 05/18/2020 04:53   DG CHEST PORT 1 VIEW  Result Date: 05/17/2020 CLINICAL DATA:  Aspiration into airway EXAM: PORTABLE CHEST 1 VIEW COMPARISON:  05/12/2020 FINDINGS: Prior CABG. Cardiomegaly. Bilateral airspace opacities are noted, left greater than right. These have increased since prior study. No visible effusions or pneumothorax. Interval extubation. IMPRESSION: Worsening bilateral airspace disease, left greater than right concerning for pneumonia. Mild cardiomegaly. Electronically Signed   By: Charlett Nose M.D.   On: 05/17/2020 10:48   DG Abd Portable 1V  Result Date: 05/17/2020 CLINICAL DATA:  Feeding tube placement. EXAM: PORTABLE ABDOMEN - 1 VIEW COMPARISON:  May 12, 2020. FINDINGS: The bowel gas pattern is normal. Distal tip of feeding tube is seen within the stomach. No radio-opaque calculi or other significant radiographic abnormality are seen. IMPRESSION: Distal tip of feeding tube seen within the stomach. No evidence of bowel obstruction  or ileus. Electronically Signed   By: Lupita Raider M.D.   On: 05/17/2020 12:03    Assessment/Plan: The patient is more somnolent today but the rest of his neurological examination remains unchanged. is unchanged. EVD draining well with drainage between 0 and 11cc/h over last 24h. Will maintain EVD at 10 CmH20 today with plan to raise tomorrow. If patient tolerates raising of the EVD tomorrow, we will plan to clamp on Saturday and obtain a follow up CTH.Continue supportive care.   LOS: 7 days     Council Mechanic, DNP, NP-C 05/18/2020, 10:43 AM

## 2020-05-18 NOTE — Progress Notes (Signed)
Occupational Therapy Treatment Patient Details Name: Jason [name] MRN: [id number] DOB: 1950/04/16 Today's Date: 05/18/2020    History of present illness 70 yo male presenting with AMS. 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 Pt s/p R frontal IVC placement, ETT 2/25-3/1. Pt with recent admission after being found down at home with DKA. Other PMH includes: DM, HTN, CVA, CABG, afib with rvr, anxiety, and depression.   OT comments  Upon arrival, pt in and out of sleep and supine in bed with RN at bed side. Pt responsive to name and making eye contact. Pt following simple commands to participate in oral care task. Pt requiring Max A for hand over hand to coordinate movement and complete oral care. Pt requiring cues throughout to prevent reaching for lines. Continue to recommend dc to SNF and will continue to follow acutely as admitted.    Follow Up Recommendations  SNF    Equipment Recommendations  None recommended by OT    Recommendations for Other Services PT consult    Precautions / Restrictions Precautions Precautions: Fall;Other (comment) restlesss Precaution Comments: IVD-needs to be clamped prior to moving pt or the bed, BP<160 systolic       Mobility Bed Mobility Overal bed mobility: Needs Assistance             General bed mobility comments: Max A for reposition    Transfers                 General transfer comment: unable, pt fatiguing from EOB sitting    Balance                                           ADL either performed or assessed with clinical judgement   ADL Overall ADL's : Needs assistance/impaired     Grooming: Maximal assistance;Oral care;Bed level Grooming Details (indicate cue type and reason): optimizing upright posture. Havign pt reach and grasp yankauer suction with oral care attachment. Requiring assistance for targetted reach. Pt abel to bring yankauer to mouth with support at elbow  and then requiring hand over hand for back and forth motion.                               General ADL Comments: Focused on cognition with oral care at bed level     Vision       Perception     Praxis      Cognition Arousal/Alertness: Awake/alert (drowsy) Behavior During Therapy: Flat affect Overall Cognitive Status: Impaired/Different from baseline Area of Impairment: Orientation;Attention;Following commands;Safety/judgement;Problem solving;Awareness                 Orientation Level: Disoriented to;Time;Situation Current Attention Level: Focused;Sustained   Following Commands: Follows one step commands inconsistently Safety/Judgement: Decreased awareness of deficits;Decreased awareness of safety Awareness: Intellectual Problem Solving: Slow processing;Decreased initiation;Difficulty sequencing;Requires verbal cues;Requires tactile cues General Comments: Pt able to state he is at [facility]. Pt with short sustained attention to grooming tasks (~30 sec). Requiring increased time and cues. Pt restless at times and requiring cues to prevent reaching for lines.        Exercises     Shoulder Instructions       General Comments VSS    Pertinent Vitals/ Pain       Pain Assessment: Faces Faces  Pain Scale: Hurts little more Pain Location: Generalized, during mobility Pain Descriptors / Indicators: Grimacing Pain Intervention(s): Monitored during session;Limited activity within patient's tolerance;Repositioned  Home Living                                          Prior Functioning/Environment              Frequency  Min 2X/week        Progress Toward Goals  OT Goals(current goals can now be found in the care plan section)  Progress towards OT goals: Progressing toward goals  Acute Rehab OT Goals Patient Stated Goal: none stated OT Goal Formulation: Patient unable to participate in goal setting Time For Goal  Achievement: 05/29/20 Potential to Achieve Goals: Good ADL Goals Pt Will Perform Grooming: bed level;sitting;with mod assist Additional ADL Goal #1: Pt will follow one step commands during ADLs with Mod cues Additional ADL Goal #2: Pt will demonstrate sustained attention during ADLs with Mod cues  Plan Discharge plan remains appropriate    Co-evaluation                 AM-PAC OT "6 Clicks" Daily Activity     Outcome Measure   Help from another person eating meals?: Total Help from another person taking care of personal grooming?: Total Help from another person toileting, which includes using toliet, bedpan, or urinal?: Total Help from another person bathing (including washing, rinsing, drying)?: Total Help from another person to put on and taking off regular upper body clothing?: Total Help from another person to put on and taking off regular lower body clothing?: Total 6 Click Score: 6    End of Session    OT Visit Diagnosis: Unsteadiness on feet (R26.81);Other abnormalities of gait and mobility (R26.89);Muscle weakness (generalized) (M62.81)   Activity Tolerance Patient limited by fatigue   Patient Left in bed;with call bell/phone within reach;with nursing/sitter in room   Nurse Communication Mobility status        Time: 8546-2703 OT Time Calculation (min): 17 min  Charges: OT General Charges $OT Visit: 1 Visit OT Treatments $Self Care/Home Management : 8-22 mins  Colsen Modi MSOT, OTR/L Acute Rehab Pager: [phone] Office: [phone]   Theodoro Grist Nancy Arvin 05/18/2020, 6:14 PM

## 2020-05-18 NOTE — Progress Notes (Signed)
Nutrition Follow-up  DOCUMENTATION CODES:   Not applicable  INTERVENTION:   Tube feeding via Cortrak tube: Change to Jevity 1.5 at 55 ml/h (1320 ml per day) Prosource TF 45 ml TID  Provides 2100 kcal, 117 gm protein, 1003 ml free water daily   NUTRITION DIAGNOSIS:   Inadequate oral intake related to inability to eat as evidenced by NPO status. Ongoing.   GOAL:   Patient will meet greater than or equal to 90% of their needs Met with TF.   MONITOR:   TF tolerance  REASON FOR ASSESSMENT:   Consult,Ventilator Enteral/tube feeding initiation and management  ASSESSMENT:   Pt with PMH of anxiety, depression, DM, HTN, stroke, and MI admitted with ICH likely due to HTN.    Pt discussed during ICU rounds and with RN.   2/25 s/p EVD and intubation  3/1 extubated; vomited post extubation concern for aspiration PNA 3/2 failed CVD clamping; cortrak placed with tip gastric per xray    Medications reviewed and include: lasix,  novolog, SS, lantus, miralax, senokot-s, thiamine   Precedex  Labs reviewed:  CBG's: 163-234   EVD: 108 ml   Current TF:  Osmolite 1.5 at 50 ml/h and Prosource TF 45 ml TID Provides 1920 kcal, 108 gm protein  Diet Order:   Diet Order            Diet NPO time specified  Diet effective now                 EDUCATION NEEDS:   No education needs have been identified at this time  Skin:  Skin Assessment: Reviewed RN Assessment  Last BM:  unknown  Height:   Ht Readings from Last 1 Encounters:  05/12/20 $RemoveB'5\' 10"'oetYdbHP$  (1.778 m)    Weight:   Wt Readings from Last 1 Encounters:  05/18/20 96.5 kg    Ideal Body Weight:     BMI:  Body mass index is 30.53 kg/m.  Estimated Nutritional Needs:   Kcal:  2100-2300  Protein:  115-130 grams  Fluid:  >2 L/day  Lockie Pares., RD, LDN, CNSC See AMiON for contact information

## 2020-05-18 NOTE — Progress Notes (Signed)
NAME:  Jason [name], MRN:  [id number], DOB:  June 23, 1950, LOS: 7 ADMISSION DATE:  05/11/2020, CONSULTATION DATE:  2/25 REFERRING MD:  Dr. Iver Nestle Neurology, CHIEF COMPLAINT:  AMS  Brief History:  71 year old male admitted with hemorrhagic stroke ultimately requiring intubation for airway protection and EVD placed by neurosurgery.   History of Present Illness:  Patient is encephalopathic and/or intubated. Therefore history has been obtained from chart review.   70 year old male with PMH as below, which is significant for CAD, DM, HTN, PAF on AC, and CVA. He was last known well 2/23, and 2/24 in the morning he was found to be confused "talking out of his head". EMS was apparently called, but he was not initially transported to the emergency department for some reason. His mental status worsened and EMS was called again, this time transporting the patient to [facility] ED. In the ED he was noted to be febrile prompting an infectious workup. CT of the head showed ICH with intraventricular extension with concern for hydrocephalus. SBP on presentation 205. Theodoro Parma was given for Eliquis reversal. He was admitted by neurology to ICU where unfortunately his mental status continued to decline. PCCM was consulted for intubation at the same time neurology was consulted for ventriculostomy placement.   Past Medical History:    has a past medical history of Anxiety, Coronary artery disease, Depression, Diabetes mellitus without complication (HCC), Dysrhythmia, Hypertension, Myocardial infarction [facility]), and Stroke (HCC).   Significant Hospital Events:  2/24 admitted for [facility] 2/25 Intubated, EVD placed.  3/1 extubated 3/2 EVD clamped, however worsening mental status so reopened  Consults:  Neurosurgery  Procedures:  ETT 2/25 >3/1 EVD 2/25 >  Significant Diagnostic Tests:   CT head 2/24 > 1.2cm thalamic hemorrhage with intraventricular extension. Chronic right corona radiata lacunar insult.   CT head  2/25 > Medial left thalamic intraparenchymal hemorrhage appears the same, approximately 1 cm in size. Extension into the ventricular system with the amount of intraventricular blood being very similar. Ventriculomegaly appears the same without further dilatation.  Echocardiogram 05/12/20 estimation, is 45 to 50%.The left ventricle has no  regional wall motion abnormalities.  Micro Data:  Blood 2/24 > no growth final Urine 2/24 > no  growth final  Antimicrobials:  Rocephin 3/2-  Interim History / Subjective:  Precedex remains off  No acte issues overnight  T-max 101.1 EVD remains in place   Objective   Blood pressure 137/68, pulse 93, temperature (!) 101.1 F (38.4 C), temperature source Axillary, resp. rate (!) 39, height 5\' 10"  (1.778 m), weight 96.5 kg, SpO2 99 %.        Intake/Output Summary (Last 24 hours) at 05/18/2020 0949 Last data filed at 05/18/2020 0900 Gross per 24 hour  Intake 2865.09 ml  Output 1613 ml  Net 1252.09 ml   Filed Weights   05/16/20 0402 05/17/20 0454 05/18/20 0437  Weight: 100 kg 96.4 kg 96.5 kg   Physical exam General: Acute on chronic ill appearing elderly male lying in bed in NAD HEENT: [place]/AT, MM pink/moist, PERRL,  Neuro: Alert and interactive, non-focal, slightly sleepy CV: s1s2 regular rate and rhythm, no murmur, rubs, or gallops,  PULM:  Diminished bases, productive cough, faint rhonchi, oxygen sats appropriate on RA  GI: soft, bowel sounds active in all 4 quadrants, non-tender, non-distended, tolerating TF Extremities: warm/dry, no edema  Skin: no rashes or lesions  Resolved Hospital Problem list   Hypokalemia   Assessment & Plan:   Intraparenchymal hemorrhage  -  Likely hypertensive in nature. Anticoagulated on Eliquis, KCentra given in ED. -Left thalamic with intraventricular extension. Likely hypertensive in nature. -Anticoagulated on Eliquis, KCentra given in ED.  -EVD placed 2/25 P: Management per neurology  Maintain neuro  protective measures; goal for eurothermia, euglycemia, eunatermia, normoxia, and PCO2 goal of 35-40 Nutrition and bowel regiment  Seizure precautions  Aspirations precautions EVDper neurosurgery, did not tolerated clamping 3/2 Frequent neuro checks   Hypertensive emergency -BP on arrival was 205/120 P: Continue Lopressor and Benazepril  PRN IV antihypertensives   Concern for aspiration pneumonia  -Extubated 3/1 with a few episodes of vomiting post extubation -T-max 101.1 overnight  P: Empiric ceftriaxone started 3/2, increased to 2gm 3/3 Follow sputum culture  Head of bed elevated 30 degrees. Follow intermittent chest x-ray and ABG.   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 Will continue with rate control P: Continuous telemetry  Home eliquis remains on hold  Optimize electrolytes  Monitor volume status   DM2:  -Hgb A1C 9.4 P: SSI CBG q4hrs Continue Lantus  Tube feed coverage   QTC prolongation P: Avoid QTc prolonging med's   Best practice (evaluated daily)  Diet: NPO, Tube feeds at goal Pain/Anxiety/Delirium protocol (if indicated): Stop all sedation now that extubated VAP protocol (if indicated): Not extubate DVT prophylaxis: SCD-start prophylactic dose heparin GI prophylaxis: Discontinue now that extubated Glucose control: SSI Mobility: PT OT, SLP Disposition:FULL  Goals of Care:  Last date of multidisciplinary goals of care discussion: per primary Family and staff present:  Summary of discussion:  Follow up goals of care discussion due: 3/4 Code Status: FULL  Labs   CBC: Recent Labs  Lab 05/12/20 0415 05/13/20 0431 05/14/20 0526 05/15/20 0251 05/16/20 0331 05/17/20 0241  WBC 9.6 7.3 7.4 8.5 8.9 11.2*  NEUTROABS 7.7 4.6 4.8 6.1 6.6  --   HGB 15.0 14.1 14.0 13.3 13.4 15.0  HCT 46.3 43.3  42.4 42.5 41.6 47.8  MCV 85.9 85.9 87.4 89.9 88.1 89.5  PLT 161 PLATELET CLUMPS NOTED ON SMEAR, COUNT APPEARS ADEQUATE 106* 109* 119* 129*    Basic Metabolic Panel: Recent Labs  Lab 05/12/20 1710 05/13/20 0431 05/13/20 1721 05/14/20 0526 05/15/20 0251 05/16/20 0331 05/17/20 0241  NA  --  139  --  140 140 138 142  K  --  3.4*  --  4.5 4.4 4.5 4.6  CL  --  106  --  106 108 102 105  CO2  --  25  --  25 25 27 26   GLUCOSE  --  123*  --  322* 169* 326* 229*  BUN  --  9  --  12 15 32* 18  CREATININE  --  0.82  --  0.66 0.88 1.25* 0.65  CALCIUM  --  8.4*  --  8.3* 8.2* 8.2* 8.5*  MG 2.4 1.9 2.1 2.1  --   --  2.2  PHOS 3.3 3.4 3.2 3.9  --   --  3.1   GFR: Estimated Creatinine Clearance: 98.7 mL/min (by C-G formula based on SCr of 0.65 mg/dL). Recent Labs  Lab 05/11/20 1936 05/12/20 0415 05/14/20 0526 05/15/20 0251 05/16/20 0331 05/17/20 0241  WBC 9.2   < > 7.4 8.5 8.9 11.2*  LATICACIDVEN 1.6  --   --   --   --   --    < > = values in this  interval not displayed.    Liver Function Tests: Recent Labs  Lab 05/12/20 0415 05/13/20 0431 05/14/20 0526 05/15/20 0251 05/16/20 0331  AST 25 18 10* 10* 14*  ALT 25 12 12 10 9   ALKPHOS 113 94 90 79 70  BILITOT 1.1 1.1 0.5 0.5 0.6  PROT 6.3* 5.4* 5.1* 5.2* 5.5*  ALBUMIN 3.2* 2.6* 2.3* 2.3* 2.3*   No results for input(s): LIPASE, AMYLASE in the last 168 hours. Recent Labs  Lab 05/11/20 1936  AMMONIA 13    ABG    Component Value Date/Time   PHART 7.477 (H) 05/12/2020 0413   PCO2ART 34.1 05/12/2020 0413   PO2ART 113 (H) 05/12/2020 0413   HCO3 25.1 05/12/2020 0413   TCO2 26 05/12/2020 0413   ACIDBASEDEF 15.0 (H) 02/26/2020 1450   O2SAT 99.0 05/12/2020 0413     Coagulation Profile: Recent Labs  Lab 05/11/20 2018 05/13/20 1721  INR 1.1 1.1    Cardiac Enzymes: No results for input(s): CKTOTAL, CKMB, CKMBINDEX, TROPONINI in the last 168 hours.  HbA1C: Hgb A1c MFr Bld  Date/Time Value Ref Range Status   05/11/2020 11:40 PM 9.4 (H) 4.8 - 5.6 % Final    Comment:    (NOTE) Pre diabetes:          5.7%-6.4%  Diabetes:              >6.4%  Glycemic control for   <7.0% adults with diabetes   02/26/2020 07:43 PM 12.8 (H) 4.8 - 5.6 % Final    Comment:    (NOTE) Pre diabetes:          5.7%-6.4%  Diabetes:              >6.4%  Glycemic control for   <7.0% adults with diabetes     CBG: Recent Labs  Lab 05/17/20 1534 05/17/20 1953 05/17/20 2323 05/18/20 0337 05/18/20 0717  GLUCAP 111* 196* 257* 234* 163*    CRITICAL CARE Performed by: 07/18/20  Total critical care time: 37 minutes  Critical care time was exclusive of separately billable procedures and treating other patients.  Critical care was necessary to treat or prevent imminent or life-threatening deterioration.  Critical care was time spent personally by me on the following activities: development of treatment plan with patient and/or surrogate as well as nursing, discussions with consultants, evaluation of patient's response to treatment, examination of patient, obtaining history from patient or surrogate, ordering and performing treatments and interventions, ordering and review of laboratory studies, ordering and review of radiographic studies, pulse oximetry and re-evaluation of patient's condition.  Delfin Gant, NP-C [facility] Personal contact information can be found on Amion  If no response please page: Adult pulmonary and critical care medicine pager on Amion unitl 7pm After 7pm please call [phone] 05/18/2020, 10:09 AM

## 2020-05-19 ENCOUNTER — Inpatient Hospital Stay (HOSPITAL_COMMUNITY): Payer: Medicare HMO

## 2020-05-19 DIAGNOSIS — I480 Paroxysmal atrial fibrillation: Secondary | ICD-10-CM | POA: Diagnosis not present

## 2020-05-19 DIAGNOSIS — I629 Nontraumatic intracranial hemorrhage, unspecified: Secondary | ICD-10-CM | POA: Diagnosis not present

## 2020-05-19 DIAGNOSIS — I615 Nontraumatic intracerebral hemorrhage, intraventricular: Secondary | ICD-10-CM | POA: Diagnosis not present

## 2020-05-19 DIAGNOSIS — J9601 Acute respiratory failure with hypoxia: Secondary | ICD-10-CM | POA: Diagnosis not present

## 2020-05-19 DIAGNOSIS — I4892 Unspecified atrial flutter: Secondary | ICD-10-CM | POA: Diagnosis not present

## 2020-05-19 DIAGNOSIS — R400 Somnolence: Secondary | ICD-10-CM | POA: Diagnosis not present

## 2020-05-19 DIAGNOSIS — R4182 Altered mental status, unspecified: Secondary | ICD-10-CM | POA: Diagnosis not present

## 2020-05-19 DIAGNOSIS — Z978 Presence of other specified devices: Secondary | ICD-10-CM | POA: Diagnosis not present

## 2020-05-19 DIAGNOSIS — J690 Pneumonitis due to inhalation of food and vomit: Secondary | ICD-10-CM | POA: Diagnosis not present

## 2020-05-19 LAB — GLUCOSE, CAPILLARY
Glucose-Capillary: 130 mg/dL — ABNORMAL HIGH (ref 70–99)
Glucose-Capillary: 158 mg/dL — ABNORMAL HIGH (ref 70–99)
Glucose-Capillary: 162 mg/dL — ABNORMAL HIGH (ref 70–99)
Glucose-Capillary: 176 mg/dL — ABNORMAL HIGH (ref 70–99)
Glucose-Capillary: 177 mg/dL — ABNORMAL HIGH (ref 70–99)
Glucose-Capillary: 188 mg/dL — ABNORMAL HIGH (ref 70–99)

## 2020-05-19 MED ORDER — POTASSIUM CHLORIDE 20 MEQ PO PACK
40.0000 meq | PACK | Freq: Once | ORAL | Status: AC
  Administered 2020-05-19: 40 meq
  Filled 2020-05-19: qty 2

## 2020-05-19 MED ORDER — POTASSIUM CHLORIDE 20 MEQ PO PACK: 40.0000 meq | PACK | Freq: Once | ORAL | Status: DC

## 2020-05-19 MED ORDER — FUROSEMIDE 10 MG/ML IJ SOLN
40.0000 mg | Freq: Once | INTRAMUSCULAR | Status: AC
  Administered 2020-05-19: 40 mg via INTRAVENOUS
  Filled 2020-05-19: qty 4

## 2020-05-19 MED ORDER — METOPROLOL TARTRATE 100 MG PO TABS
100.0000 mg | ORAL_TABLET | Freq: Three times a day (TID) | ORAL | Status: DC
  Administered 2020-05-19 – 2020-05-30 (×35): 100 mg
  Filled 2020-05-19: qty 2
  Filled 2020-05-19: qty 1
  Filled 2020-05-19 (×4): qty 2
  Filled 2020-05-19: qty 1
  Filled 2020-05-19 (×3): qty 2
  Filled 2020-05-19: qty 1
  Filled 2020-05-19 (×10): qty 2
  Filled 2020-05-19 (×2): qty 1
  Filled 2020-05-19 (×2): qty 2
  Filled 2020-05-19 (×2): qty 1
  Filled 2020-05-19 (×3): qty 2
  Filled 2020-05-19 (×3): qty 1
  Filled 2020-05-19: qty 4
  Filled 2020-05-19: qty 1
  Filled 2020-05-19 (×3): qty 2

## 2020-05-19 MED ORDER — INSULIN GLARGINE 100 UNIT/ML ~~LOC~~ SOLN
25.0000 [IU] | Freq: Two times a day (BID) | SUBCUTANEOUS | Status: DC
  Administered 2020-05-19 – 2020-05-22 (×6): 25 [IU] via SUBCUTANEOUS
  Filled 2020-05-19 (×9): qty 0.25

## 2020-05-19 NOTE — Progress Notes (Signed)
  Speech Language Pathology Treatment: Dysphagia  Patient Details Name: Faheem Ziemann MRN: [id number] DOB: [birth year] Today's Date: 05/19/2020 Time: 3532-9924 SLP Time Calculation (min) (ACUTE ONLY): 14 min  Assessment / Plan / Recommendation Clinical Impression  Pt fully alert, able to take 8 oz of water without coughing. 3 oz consumed consecutively. Vocal quality clear. Clearing dried oral secretions. Continue Cortrak given potential for decreased metnation but allow full liquids when alert and upright.   HPI HPI: 70yo male admitted 05/11/20 with AMS. PMH: HTN, DM, CVA, CAD, MI, CABG, AFib, anxiety/depression, recent fall (02/2020) with DKA/HHS, memory impairment.  CTHead - hemorrhage with intraventricular extension and possibly mildly increased ventricular size. Intubated 2/25 - 3/1.      SLP Plan  Continue with current plan of care       Recommendations  Diet recommendations: Thin liquid Liquids provided via: Cup;Straw Medication Administration: Via alternative means                General recommendations: Rehab consult Follow up Recommendations: Inpatient Rehab Plan: Continue with current plan of care       GO               Harlon Ditty, MA CCC-SLP  Acute Rehabilitation Services Pager [phone] Office [phone]  Claudine Mouton 05/19/2020, 3:24 PM

## 2020-05-19 NOTE — Progress Notes (Signed)
eLink Physician-Brief Progress Note Patient Name: Jason [name] DOB: Jan 19, 1951 MRN: [id number]   Date of Service  05/19/2020  HPI/Events of Note  Fever to 102.8 F - CXR 05/17/2020 c/w corsening bilateral airspace disease, left greater than right concerning for pneumonia. Sputum Gram Stain - FEW WBC PRESENT, PREDOMINANTLY PMN  FEW GRAM NEGATIVE RODS  FEW GRAM POSITIVE COCCI IN PAIRS IN CLUSTERS  RARE GRAM POSITIVE RODS  Patient currently on Zosyn IV.   eICU Interventions  Plan: 1. Blood cultures X 2 now.  2. Sputum for Gram stain and  Reflex culture now.      Intervention Category Major Interventions: Infection - evaluation and management  Jayli Fogleman [place] 05/19/2020, 12:01 AM

## 2020-05-19 NOTE — Progress Notes (Signed)
Subjective: Patient is alert and oriented to self and place. He is following simple commands. He is in NAD. No acute events overnight.   Objective: Vital signs in last 24 hours: Temp:  [97.6 F (36.4 C)-102.8 F (39.3 C)] 100.6 F (38.1 C) (03/04 0400) Pulse Rate:  [47-143] 93 (03/04 0600) Resp:  [16-48] 32 (03/04 0600) BP: (121-191)/(64-106) 156/82 (03/04 0600) SpO2:  [96 %-100 %] 99 % (03/04 0600)  Intake/Output from previous day: 03/03 0701 - 03/04 0700 In: 2458.9 [I.V.:1148.8; NG/GT:1210; IV Piggyback:100.2] Out: 2806 [Urine:2700; Drains:106] Intake/Output this shift: No intake/output data recorded.  Physical Exam: Patient is alert and following simple commands. Pupils 109mm and sluggish B/L. MAEW. BUE 4/5, BLE 4/5. EVD is patent with CDI dressing.   Lab Results: Recent Labs    05/17/20 0241 05/18/20 0920  WBC 11.2* 8.4  HGB 15.0 14.7  HCT 47.8 46.1  PLT 129* 166   BMET Recent Labs    05/17/20 0241 05/18/20 0920  NA 142 144  K 4.6 3.4*  CL 105 108  CO2 26 26  GLUCOSE 229* 184*  BUN 18 16  CREATININE 0.65 0.72  CALCIUM 8.5* 8.2*    Studies/Results: CT HEAD WO CONTRAST  Result Date: 05/19/2020 CLINICAL DATA:  A history of recent stroke with intraventricular hemorrhage and left thalamic hemorrhage EXAM: CT HEAD WITHOUT CONTRAST TECHNIQUE: Contiguous axial images were obtained from the base of the skull through the vertex without intravenous contrast. COMPARISON:  Multiple previous exams the most recent of which is dated 05/18/2020 FINDINGS: Brain: There again noted changes of intraventricular hemorrhage particularly within the occipital horns of the lateral ventricles bilaterally. Ventriculostomy catheter is again noted on the right and stable. The degree of third ventricular hemorrhage and left thalamic parenchymal hemorrhage has resolved similar to that seen on the prior day. No new hemorrhage is seen. Persistent ventricular dilatation is noted. Mild atrophic  changes and chronic white matter ischemic changes are seen. No acute infarct is noted. Vascular: No hyperdense vessel or unexpected calcification. Skull: Normal. Negative for fracture or focal lesion. Sinuses/Orbits: Persistent opacification of the right sphenoid sinus. Other: None IMPRESSION: Persistent intraventricular hemorrhage within the lateral ventricles posteriorly. Ventriculostomy catheter is again identified on the right and stable. No new focal hemorrhage is seen. Chronic atrophic and ischemic changes. Electronically Signed   By: Alcide Clever M.D.   On: 05/19/2020 05:45   CT HEAD WO CONTRAST  Result Date: 05/18/2020 CLINICAL DATA:  Intracranial hemorrhage, follow-up examination EXAM: CT HEAD WITHOUT CONTRAST TECHNIQUE: Contiguous axial images were obtained from the base of the skull through the vertex without intravenous contrast. COMPARISON:  05/13/2020 FINDINGS: Brain: Intraventricular clot within the third ventricle as well as intraparenchymal hematoma within the medial left thalamus have resolved. Layering intraventricular hemorrhage is again seen with the occipital horns of the a lateral ventricles bilaterally. Ventricular size is stable. Right frontal ventriculostomy is again identified with its tip in the region of the foramina [place]. No interval hemorrhage. Parenchymal volume loss is commensurate with the patient's age. Moderate periventricular white matter changes are present. Remote lacunar infarct noted within the right corona radiata and left subinsular white matter. No extra-axial fluid collection identified. Cerebellum is unremarkable. Vascular: No asymmetric hyperdense vasculature at the skull base. Skull: Intact Sinuses/Orbits: There is opacification of the right sphenoid sinus. Remaining paranasal sinuses are clear. Orbits are unremarkable. Other: Mastoid air cells and middle ear cavities are clear. IMPRESSION: Interval resolution of intraventricular clot within the third ventricle  and intraparenchymal  hematoma within the left thalamus. Stable volume intraventricular hemorrhage within the occipital horns bilaterally. Right ventriculostomy in place.  Stable ventricular size. Stable senescent changes with bilateral remote infarcts as outlined above. Electronically Signed   By: Helyn Numbers MD   On: 05/18/2020 04:53   DG CHEST PORT 1 VIEW  Result Date: 05/17/2020 CLINICAL DATA:  Aspiration into airway EXAM: PORTABLE CHEST 1 VIEW COMPARISON:  05/12/2020 FINDINGS: Prior CABG. Cardiomegaly. Bilateral airspace opacities are noted, left greater than right. These have increased since prior study. No visible effusions or pneumothorax. Interval extubation. IMPRESSION: Worsening bilateral airspace disease, left greater than right concerning for pneumonia. Mild cardiomegaly. Electronically Signed   By: Charlett Nose M.D.   On: 05/17/2020 10:48   DG Abd Portable 1V  Result Date: 05/17/2020 CLINICAL DATA:  Feeding tube placement. EXAM: PORTABLE ABDOMEN - 1 VIEW COMPARISON:  May 12, 2020. FINDINGS: The bowel gas pattern is normal. Distal tip of feeding tube is seen within the stomach. No radio-opaque calculi or other significant radiographic abnormality are seen. IMPRESSION: Distal tip of feeding tube seen within the stomach. No evidence of bowel obstruction or ileus. Electronically Signed   By: Lupita Raider M.D.   On: 05/17/2020 12:03    Assessment/Plan: The patient is less somnolent today. He is following commands. EVD draining well with drainage between 0 and 15 cc/h over last 24h. Will raise EVD at 15 CmH20 today with plan to clamp tomorrow. If patient tolerates raising of the EVD tomorrow, we will plan to clamp on Saturday and obtain a follow up CTH. Continue supportive care.    LOS: 8 days     Council Mechanic, DNP, NP-C 05/19/2020, 8:27 AM

## 2020-05-19 NOTE — Progress Notes (Signed)
STROKE TEAM PROGRESS NOTE   INTERVAL HISTORY  Febrile to 102.8. Blood cultures and sputum sent.  HCT repeated with no new findings. EVD management per Neurosurgery He is following commands and speaking more clearly today. Asks for water during our exam. No family at bedside.   Vitals:   05/19/20 0300 05/19/20 0400 05/19/20 0500 05/19/20 0600  BP: (!) 153/70 (!) 153/73 (!) 157/71 (!) 156/82  Pulse: 96 97 96 93  Resp: 20 (!) 48 (!) 43 (!) 32  Temp:  (!) 100.6 F (38.1 C)    TempSrc:  Oral    SpO2: 100% 100% 100% 99%  Weight:      Height:       CBC:  Recent Labs  Lab 05/16/20 0331 05/17/20 0241 05/18/20 0920  WBC 8.9 11.2* 8.4  NEUTROABS 6.6  --  6.1  HGB 13.4 15.0 14.7  HCT 41.6 47.8 46.1  MCV 88.1 89.5 87.6  PLT 119* 129* 166   Basic Metabolic Panel:  Recent Labs  Lab 05/14/20 0526 05/15/20 0251 05/17/20 0241 05/18/20 0920  NA 140   < > 142 144  K 4.5   < > 4.6 3.4*  CL 106   < > 105 108  CO2 25   < > 26 26  GLUCOSE 322*   < > 229* 184*  BUN 12   < > 18 16  CREATININE 0.66   < > 0.65 0.72  CALCIUM 8.3*   < > 8.5* 8.2*  MG 2.1  --  2.2  --   PHOS 3.9  --  3.1  --    < > = values in this interval not displayed.   Lipid Panel:  No results for input(s): CHOL, TRIG, HDL, CHOLHDL, VLDL, LDLCALC in the last 168 hours. HgbA1c:  No results for input(s): HGBA1C in the last 168 hours. Urine Drug Screen:  No results for input(s): LABOPIA, COCAINSCRNUR, LABBENZ, AMPHETMU, THCU, LABBARB in the last 168 hours.  Alcohol Level No results for input(s): ETH in the last 168 hours.  IMAGING past 24 hours EEG adult  Result Date: 05/14/2020  IMPRESSION: This study is suggestive of moderate to evere diffuse encephalopathy, nonspecific etiology. No seizures or epileptiform discharges were seen throughout the recording.  PHYSICAL EXAM  Temp:  [97.6 F (36.4 C)-102.8 F (39.3 C)] 100.6 F (38.1 C) (03/04 0400) Pulse Rate:  [47-143] 93 (03/04 0600) Resp:  [16-48] 32  (03/04 0600) BP: (121-191)/(64-106) 156/82 (03/04 0600) SpO2:  [96 %-100 %] 99 % (03/04 0600)  General - Well nourished, well developed elderly male not in distress  Ophthalmologic - fundi not visualized due to noncooperation.  Cardiovascular -A. fib flutter with irregularly irregular heart rate and rhythm.  Neuro -  Patient is drowsy but can be aroused..  Follows simple commands.  Left gaze preference unable to look to the right of midline.  Blinks to threat on the left but not on the right.  Right facial weakness.  Tongue midline.  Is able to move all 4 extremities against gravity left side slightly more than right.  No focal weakness.  ASSESSMENT/PLAN Jason [name] is a 70 y.o. male with history of HTN, DM, CAD/MI, afib on eliquis presenting with AMS, garbled speech, N/V and behavior change.   ICH - left thalamic ICH with IVH, likely due to HTN in the setting of eliquis use 3.4 3/3 fever to 101 overnight, Abx regimen changed to Zosyn 3/2 failed clamping trial, ceftriaxone started for PNA 3/1 EVD weaned  to 20 cm H20, ICPs stable, Extubated  2/27 Neurosurgery instilled tPA into EVD 2/26 Updated Head CT State with decline in responsiveness. Extension of IVH noted. Exam improved on less fentanyl but not back to following commands.    CT left thalamic ICH with IVH, no hydrocephalus  Repeat CT head 2/25 stable hematoma and IVH   Repeat CT head 2/26 Medial left thalamic intraparenchymal hemorrhage appearssimilar in size measuring approximately 1 cm on series 19, image 3,with increased volume of intraventricular blood extending from the area of hemorrhage. Including filling both the third ventricle with layering blood in the fourth and lateral ventricle  2D Echo EF 45-50%, No shunt  LDL 84  HgbA1c 9.4  VTE prophylaxis - SCDs, Lovenox 40mg  daily  Eliquis (apixaban) daily prior to admission, now on No antithrombotic.   No anticoagulant or antiplatelet medications  Therapy  recommendations:  PT pending, OT SNF  Disposition:  Pending   Hypertensive emergency  Home meds:  Lotensin, metoprolol  Stable  On Benazepril 40mg  daily, metoprolol 50mg  bid . SBP goal to <160 . Hydralazine 20mg  q6hr prn, labetalol 10mg  q2 hr prn . Long-term BP goal normotensive  Afib/Aflutter on [facility] with HF   On eliquis PTA  Reversed by Kcentra  No antithrombotics due to ICH  Continue po cardizem and metoprolol for rate  control  Cardiology consult appreciated: Rate control adequate.   Continue tele monitoring   EF 45%   Respiratory failure and LLL Pneumonia  Extubated 3/1, vomited post extubation  CCM on board and appreciated  Empiric antibiotics Zosyn,   WBC 8.9 -> 11.2->8.4  tmax 102.8 3/4  Blood cultures obtained 3/4 pending  Sputum culture is pending  On Zosyn   Vomited post extubation 3/1  CCM management is appreciated   Dysphagia  Likely due to hemorrhagic stroke  Failed swallow eval  cortrac for feeding and meds  Hyperlipidemia  Home meds:  crestor 20  LDL 84, goal < 70  May continue statin at discharge  Diabetes type II Uncontrolled  Home meds:  insulin  HgbA1c 9.4, goal < 7.0  CBGs improving 130-177  SSI , Lantus to 20u BID and SSI  Appreciate Diabetes Coordinator recommendations  Elevated Creatinine  Creatinine 1.25 -> 0.65    Lasix given for diuresis   Adequate uop  NS 67ml/hr  Other Stroke Risk Factors  Advanced Age >/= 40   Hx stroke/TIA  Coronary artery disease and MI     To contact Stroke Continuity provider, please refer to . After hours, contact General Neurology

## 2020-05-19 NOTE — Progress Notes (Deleted)
Removed patients right AC IV due to bleeding.  Held pressure for approximately 45 mins and patient continued bleeding. Ulnar pulse palpated but no radial pulse via doppler.  Notified dr Arsenio Loader and found a palmar arch artery with the doppler and cap refill was good.  At this point the patient had stopped bleeding.  VSS throughout and no neurological changes.  Will continue to monitor for bleeding and pulses in right arm.

## 2020-05-19 NOTE — Progress Notes (Signed)
Physical Therapy Treatment Patient Details Name: Jason [name] MRN: [id number] DOB: 1951/01/12 Today's Date: 05/19/2020    History of Present Illness 70 yo male presenting with AMS. 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 Pt s/p R frontal IVC placement, ETT 2/25-3/1. Pt with recent admission after being found down at home with DKA. Other PMH includes: DM, HTN, CVA, CABG, afib with rvr, anxiety, and depression.    PT Comments    Pt making slow progress due to his lethargy, needing repeated multi-modal simple cues throughout session this date to maintain safety. Focused session on finding and maintaining midline orientation in sitting. Pt able to maintain static sitting balance upright for brief periods of only min guard but otherwise needed min-maxA to sit EOB for > 10 min. Pt able to maintain R lateral lean onto elbow with only min guard though. Pt tends to lean to R though. Will continue to follow acutely. Current recommendations remain appropriate.    Follow Up Recommendations  SNF     Equipment Recommendations  Hospital bed    Recommendations for Other Services       Precautions / Restrictions Precautions Precautions: Fall Precaution Comments: IVD-needs to be clamped prior to moving pt or the bed, BP<160 systolic Restrictions Weight Bearing Restrictions: No    Mobility  Bed Mobility Overal bed mobility: Needs Assistance Bed Mobility: Supine to Sit;Sit to Supine     Supine to sit: Total assist;+2 for physical assistance Sit to supine: +2 for physical assistance;Total assist   General bed mobility comments: Cues provided for pt to manage legs off/on bed and reach UEs for rails, TAx2 to complete.    Transfers                 General transfer comment: unable, pt lethargic  Ambulation/Gait             General Gait Details: Pt very lethargic, unsafe to attempt at this time.   Stairs             Wheelchair Mobility     Modified Rankin (Stroke Patients Only) Modified Rankin (Stroke Patients Only) Pre-Morbid Rankin Score: Moderate disability Modified Rankin: Severe disability     Balance Overall balance assessment: Needs assistance Sitting-balance support: Bilateral upper extremity supported;Feet supported Sitting balance-Leahy Scale: Poor Sitting balance - Comments: posterior and R lateral leaning with fatigue, able to maintain static sitting balance with min guard for brief periods and when leaning laterally onto R elbow, otherwise needing min-max assist posteriorly throughout. EOB sitting >10 minutes. Postural control: Posterior lean;Right lateral lean     Standing balance comment: nt                            Cognition Arousal/Alertness: Lethargic Behavior During Therapy: Flat affect Overall Cognitive Status: Impaired/Different from baseline Area of Impairment: Attention;Following commands;Safety/judgement;Problem solving;Awareness;Memory                   Current Attention Level: Selective Memory: Decreased recall of precautions;Decreased short-term memory Following Commands: Follows one step commands inconsistently;Follows one step commands with increased time Safety/Judgement: Decreased awareness of deficits;Decreased awareness of safety Awareness: Intellectual Problem Solving: Slow processing;Decreased initiation;Difficulty sequencing;Requires verbal cues;Requires tactile cues General Comments: Pt lethargic with cues to open eyes and sustain gaze at times. Repeated simple multi-modal cues throughout, with increased time to process, sequence, and respond.      Exercises      General Comments General comments (  skin integrity, edema, etc.): RR up to 40s prior to session at times, but as low as 10s once sitting up      Pertinent Vitals/Pain Pain Assessment: Faces Faces Pain Scale: Hurts little more Pain Location: Generalized, during mobility Pain Descriptors /  Indicators: Grimacing Pain Intervention(s): Limited activity within patient's tolerance;Monitored during session;Repositioned    Home Living                      Prior Function            PT Goals (current goals can now be found in the care plan section) Acute Rehab PT Goals Patient Stated Goal: none stated PT Goal Formulation: With patient Time For Goal Achievement: 05/29/20 Potential to Achieve Goals: Good Progress towards PT goals: Progressing toward goals (slowly)    Frequency    Min 3X/week      PT Plan Current plan remains appropriate    Co-evaluation              AM-PAC PT "6 Clicks" Mobility   Outcome Measure  Help needed turning from your back to your side while in a flat bed without using bedrails?: Total Help needed moving from lying on your back to sitting on the side of a flat bed without using bedrails?: Total Help needed moving to and from a bed to a chair (including a wheelchair)?: Total Help needed standing up from a chair using your arms (e.g., wheelchair or bedside chair)?: Total Help needed to walk in hospital room?: Total Help needed climbing 3-5 steps with a railing? : Total 6 Click Score: 6    End of Session Equipment Utilized During Treatment: Oxygen Activity Tolerance: Patient limited by fatigue;Patient limited by lethargy Patient left: in bed;with call bell/phone within reach;with bed alarm set;with restraints reapplied;with SCD's reapplied Nurse Communication: Mobility status PT Visit Diagnosis: Muscle weakness (generalized) (M62.81);Difficulty in walking, not elsewhere classified (R26.2)     Time: 5732-2025 PT Time Calculation (min) (ACUTE ONLY): 22 min  Charges:  $Neuromuscular Re-education: 8-22 mins                     Raymond Gurney, PT, DPT Acute Rehabilitation Services  Pager: [phone] Office: [phone]    Jewel Baize 05/19/2020, 3:47 PM

## 2020-05-19 NOTE — Progress Notes (Signed)
NAME:  Jason [name], MRN:  [id number], DOB:  01-May-1950, LOS: 8 ADMISSION DATE:  05/11/2020, CONSULTATION DATE:  2/25 REFERRING MD:  Dr. Iver Nestle Neurology, CHIEF COMPLAINT:  AMS  Brief History:  70 year old male admitted with hemorrhagic stroke ultimately requiring intubation for airway protection and EVD placed by neurosurgery.   History of Present Illness:  Patient is encephalopathic and/or intubated. Therefore history has been obtained from chart review.   70 year old male with PMH as below, which is significant for CAD, DM, HTN, PAF on AC, and CVA. He was last known well 2/23, and 2/24 in the morning he was found to be confused "talking out of his head". EMS was apparently called, but he was not initially transported to the emergency department for some reason. His mental status worsened and EMS was called again, this time transporting the patient to [facility] ED. In the ED he was noted to be febrile prompting an infectious workup. CT of the head showed ICH with intraventricular extension with concern for hydrocephalus. SBP on presentation 205. Theodoro Parma was given for Eliquis reversal. He was admitted by neurology to ICU where unfortunately his mental status continued to decline. PCCM was consulted for intubation at the same time neurology was consulted for ventriculostomy placement.   Past Medical History:    has a past medical history of Anxiety, Coronary artery disease, Depression, Diabetes mellitus without complication (HCC), Dysrhythmia, Hypertension, Myocardial infarction [facility]), and Stroke (HCC).   Significant Hospital Events:  2/24 admitted for [facility] 2/25 Intubated, EVD placed.  3/1 extubated 3/2 EVD clamped, however worsening mental status so reopened  Consults:  Neurosurgery  Procedures:  ETT 2/25 >3/1 EVD 2/25 >  Significant Diagnostic Tests:   CT head 2/24 > 1.2cm thalamic hemorrhage with intraventricular extension. Chronic right corona radiata lacunar insult.   CT head  2/25 > Medial left thalamic intraparenchymal hemorrhage appears the same, approximately 1 cm in size. Extension into the ventricular system with the amount of intraventricular blood being very similar. Ventriculomegaly appears the same without further dilatation.  Echocardiogram 05/12/20 estimation, is 45 to 50%.The left ventricle has no  regional wall motion abnormalities.  Micro Data:  Blood 2/24 > no growth final Urine 2/24 > no  growth final  Antimicrobials:  Rocephin 3/2-  Interim History / Subjective:  Lying in bed in with complaints of ABD pain  No acute events overnight  ECD remains in place   Objective   Blood pressure (!) 156/82, pulse 93, temperature 99.1 F (37.3 C), temperature source Axillary, resp. rate (!) 32, height 5\' 10"  (1.778 m), weight 96.5 kg, SpO2 99 %.        Intake/Output Summary (Last 24 hours) at 05/19/2020 07/19/2020 Last data filed at 05/19/2020 0600 Gross per 24 hour  Intake 2368.92 ml  Output 2794 ml  Net -425.08 ml   Filed Weights   05/16/20 0402 05/17/20 0454 05/18/20 0437  Weight: 100 kg 96.4 kg 96.5 kg   Physical exam General: Chronically ill appearing elderly male lying in bed in NAD HEENT: [place]/AT, MM pink/moist, PERRL,  Neuro: Alert and oriented to self, able to follow simple commands  CV: s1s2 regular rate and rhythm, no murmur, rubs, or gallops,  PULM: Diminished in bases, slight rhonchi bilateral bases, oxygen saturations appropriate on 4L [place] GI: soft, bowel sounds active in all 4 quadrants, non-tender, non-distended, tolerating TF Extremities: warm/dry, no edema  Skin: no rashes or lesions  Resolved Hospital Problem list   Hypokalemia  Assessment & Plan:   Intraparenchymal hemorrhage  -Likely hypertensive in nature. Anticoagulated on Eliquis, KCentra given in ED. -Left thalamic with intraventricular extension. Likely hypertensive in nature. -Anticoagulated on Eliquis, KCentra given in ED.  -EVD placed 2/25 P: Management per  neurology/neurosurgery  EVD per neurosurgery  Maintain neuro protective measures; goal for eurothermia, euglycemia, eunatermia, normoxia, and PCO2 goal of 35-40 Nutrition and bowel regiment  Seizure precautions   Aspirations precautions  Frequent neuro checks   Hypertensive emergency -BP on arrival was 205/120 P: Continue PO Lopressor and Benazepril PRN IV antihypertensives   Likely aspiration pneumonia  -Extubated 3/1 with a few episodes of vomiting post extubation -T-max 101.1 overnight with left lower lobe infiltrate  P: Antibiotics broadened to Zosyn 3/3 Chest PT Encourage pulmonary hygiene  Elevated HOB Follow cultures   Atrial fibrillation on Eliquis -Converted into atrial flutter 2/25, rate 60-80 CAD 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 Will continue with rate control P: Cardiology following, appreciate assistance  Not a candidate for further anticoagulation given bleed  Continuous telemetry  Monitor volume status  Optimize electrolytes  Continue PO Cardizem and lopressor   DM2:  -Hgb A1C 9.4 P: SSI CBG q4hrs Continue Lantus  Tube feed coverage   QTC prolongation P: Avoid QTc prolonging meds  Best practice (evaluated daily)  Diet: NPO, Tube feeds at goal Pain/Anxiety/Delirium protocol (if indicated): Stop all sedation now that extubated VAP protocol (if indicated): Not extubate DVT prophylaxis: SCD-start prophylactic dose heparin GI prophylaxis: Discontinue now that extubated Glucose control: SSI Mobility: PT OT, SLP Disposition:FULL  Goals of Care:  Last date of multidisciplinary goals of care discussion: per primary Family and staff present:  Summary of discussion:  Follow up goals of care discussion due: 3/4 Code Status: FULL  Labs   CBC: Recent Labs  Lab 05/13/20 0431 05/14/20 0526 05/15/20 0251 05/16/20 0331 05/17/20 0241 05/18/20 0920  WBC 7.3 7.4 8.5 8.9 11.2* 8.4  NEUTROABS 4.6 4.8 6.1 6.6  --  6.1  HGB  14.1 14.0 13.3 13.4 15.0 14.7  HCT 43.3 42.4 42.5 41.6 47.8 46.1  MCV 85.9 87.4 89.9 88.1 89.5 87.6  PLT PLATELET CLUMPS NOTED ON SMEAR, COUNT APPEARS ADEQUATE 106* 109* 119* 129* 166    Basic Metabolic Panel: Recent Labs  Lab 05/12/20 1710 05/13/20 0431 05/13/20 0431 05/13/20 1721 05/14/20 0526 05/15/20 0251 05/16/20 0331 05/17/20 0241 05/18/20 0920  NA  --  139   < >  --  140 140 138 142 144  K  --  3.4*   < >  --  4.5 4.4 4.5 4.6 3.4*  CL  --  106   < >  --  106 108 102 105 108  CO2  --  25   < >  --  25 25 27 26 26   GLUCOSE  --  123*   < >  --  322* 169* 326* 229* 184*  BUN  --  9   < >  --  12 15 32* 18 16  CREATININE  --  0.82   < >  --  0.66 0.88 1.25* 0.65 0.72  CALCIUM  --  8.4*   < >  --  8.3* 8.2* 8.2* 8.5* 8.2*  MG 2.4 1.9  --  2.1 2.1  --   --  2.2  --   PHOS 3.3 3.4  --  3.2 3.9  --   --  3.1  --    < > = values in this  interval not displayed.   GFR: Estimated Creatinine Clearance: 98.7 mL/min (by C-G formula based on SCr of 0.72 mg/dL). Recent Labs  Lab 05/15/20 0251 05/16/20 0331 05/17/20 0241 05/18/20 0920  WBC 8.5 8.9 11.2* 8.4    Liver Function Tests: Recent Labs  Lab 05/13/20 0431 05/14/20 0526 05/15/20 0251 05/16/20 0331 05/18/20 0920  AST 18 10* 10* 14* 40  ALT 12 12 10 9 16   ALKPHOS 94 90 79 70 70  BILITOT 1.1 0.5 0.5 0.6 0.6  PROT 5.4* 5.1* 5.2* 5.5* 5.9*  ALBUMIN 2.6* 2.3* 2.3* 2.3* 2.2*   No results for input(s): LIPASE, AMYLASE in the last 168 hours. No results for input(s): AMMONIA in the last 168 hours.  ABG    Component Value Date/Time   PHART 7.477 (H) 05/12/2020 0413   PCO2ART 34.1 05/12/2020 0413   PO2ART 113 (H) 05/12/2020 0413   HCO3 25.1 05/12/2020 0413   TCO2 26 05/12/2020 0413   ACIDBASEDEF 15.0 (H) 02/26/2020 1450   O2SAT 99.0 05/12/2020 0413     Coagulation Profile: Recent Labs  Lab 05/13/20 1721  INR 1.1    Cardiac Enzymes: No results for input(s): CKTOTAL, CKMB, CKMBINDEX, TROPONINI in the last  168 hours.  HbA1C: Hgb A1c MFr Bld  Date/Time Value Ref Range Status  05/11/2020 11:40 PM 9.4 (H) 4.8 - 5.6 % Final    Comment:    (NOTE) Pre diabetes:          5.7%-6.4%  Diabetes:              >6.4%  Glycemic control for   <7.0% adults with diabetes   02/26/2020 07:43 PM 12.8 (H) 4.8 - 5.6 % Final    Comment:    (NOTE) Pre diabetes:          5.7%-6.4%  Diabetes:              >6.4%  Glycemic control for   <7.0% adults with diabetes     CBG: Recent Labs  Lab 05/18/20 1551 05/18/20 1937 05/18/20 2340 05/19/20 0346 05/19/20 0800  GLUCAP 192* 141* 130* 162* 188*    CRITICAL CARE Performed by: 07/19/20  Total critical care time: Delfin Gant  Critical care time was exclusive of separately billable procedures and treating other patients.  Critical care was necessary to treat or prevent imminent or life-threatening deterioration.  Critical care was time spent personally by me on the following activities: development of treatment plan with patient and/or surrogate as well as nursing, discussions with consultants, evaluation of patient's response to treatment, examination of patient, obtaining history from patient or surrogate, ordering and performing treatments and interventions, ordering and review of laboratory studies, ordering and review of radiographic studies, pulse oximetry and re-evaluation of patient's condition.  , NP-C [facility] Personal contact information can be found on Amion  If no response please page: Adult pulmonary and critical care medicine pager on Amion unitl 7pm After 7pm please call [phone] 05/19/2020, 9:28 AM

## 2020-05-19 NOTE — Progress Notes (Addendum)
Notified the patient had significant acute neurologic changes and STAT NCT head ordered.  Addendum: CT head reviewed which did not show new focal hemorrhage.

## 2020-05-19 NOTE — Progress Notes (Signed)
INTERVAL HISTORY  Febrile to 102.8. Blood cultures and sputum sent.  HeadCT repeated with no new findings. EVD management per Neurosurgery He is following commands and speaking more clearly today. Asks for water during our exam. No family at bedside.  Neuro exam mostly unchanged Vitals:   05/19/20 0300 05/19/20 0400 05/19/20 0500 05/19/20 0600  BP: (!) 153/70 (!) 153/73 (!) 157/71 (!) 156/82  Pulse: 96 97 96 93  Resp: 20 (!) 48 (!) 43 (!) 32  Temp:  (!) 100.6 F (38.1 C)    TempSrc:  Oral    SpO2: 100% 100% 100% 99%  Weight:      Height:       CBC:  Recent Labs  Lab 05/16/20 0331 05/17/20 0241 05/18/20 0920  WBC 8.9 11.2* 8.4  NEUTROABS 6.6  --  6.1  HGB 13.4 15.0 14.7  HCT 41.6 47.8 46.1  MCV 88.1 89.5 87.6  PLT 119* 129* 166   Basic Metabolic Panel:  Recent Labs  Lab 05/14/20 0526 05/15/20 0251 05/17/20 0241 05/18/20 0920  NA 140   < > 142 144  K 4.5   < > 4.6 3.4*  CL 106   < > 105 108  CO2 25   < > 26 26  GLUCOSE 322*   < > 229* 184*  BUN 12   < > 18 16  CREATININE 0.66   < > 0.65 0.72  CALCIUM 8.3*   < > 8.5* 8.2*  MG 2.1  --  2.2  --   PHOS 3.9  --  3.1  --    < > = values in this interval not displayed.   Lipid Panel:  No results for input(s): CHOL, TRIG, HDL, CHOLHDL, VLDL, LDLCALC in the last 168 hours. HgbA1c:  No results for input(s): HGBA1C in the last 168 hours. Urine Drug Screen:  No results for input(s): LABOPIA, COCAINSCRNUR, LABBENZ, AMPHETMU, THCU, LABBARB in the last 168 hours.  Alcohol Level No results for input(s): ETH in the last 168 hours.  IMAGING past 24 hours EEG adult  Result Date: 05/14/2020  IMPRESSION: This study is suggestive of moderate to evere diffuse encephalopathy, nonspecific etiology. No seizures or epileptiform discharges were seen throughout the recording.  PHYSICAL EXAM  Temp:  [97.6 F (36.4 C)-102.8 F (39.3 C)] 100.6 F (38.1 C) (03/04 0400) Pulse Rate:  [47-143] 93 (03/04 0600) Resp:  [16-48] 32  (03/04 0600) BP: (121-191)/(64-106) 156/82 (03/04 0600) SpO2:  [96 %-100 %] 99 % (03/04 0600)  General - Well nourished, well developed elderly male not in distress  Ophthalmologic - fundi not visualized due to noncooperation.  Cardiovascular -A. fib flutter with irregularly irregular heart rate and rhythm.  Neuro -  Patient is drowsy but can be aroused..  Follows simple commands.  Left gaze preference unable to look to the right of midline.  Blinks to threat on the left but not on the right.  Right facial weakness.  Tongue midline.  Is able to move all 4 extremities against gravity left side slightly more than right.  No focal weakness.  ASSESSMENT/PLAN Mr. Hansford Hirt is a 70 y.o. male with history of HTN, DM, CAD/MI, afib on eliquis presenting with AMS, garbled speech, N/V and behavior change.   ICH - left thalamic ICH with IVH, likely due to HTN in the setting of eliquis use 3.4 3/3 fever to 101 overnight, Abx regimen changed to Zosyn 3/2 failed clamping trial, ceftriaxone started for PNA 3/1 EVD weaned to 20 cm  H20, ICPs stable, Extubated  2/27 Neurosurgery instilled tPA into EVD 2/26 Updated Head CT State with decline in responsiveness. Extension of IVH noted. Exam improved on less fentanyl but not back to following commands.    CT left thalamic ICH with IVH, no hydrocephalus  Repeat CT head 2/25 stable hematoma and IVH   Repeat CT head 2/26 Medial left thalamic intraparenchymal hemorrhage appearssimilar in size measuring approximately 1 cm on series 19, image 3,with increased volume of intraventricular blood extending from the area of hemorrhage. Including filling both the third ventricle with layering blood in the fourth and lateral ventricle  2D Echo EF 45-50%, No shunt  LDL 84  HgbA1c 9.4  VTE prophylaxis - SCDs, Lovenox 40mg  daily  Eliquis (apixaban) daily prior to admission, now on No antithrombotic.   No anticoagulant or antiplatelet medications  Therapy  recommendations:  PT pending, OT SNF  Disposition:  Pending   Hypertensive emergency  Home meds:  Lotensin, metoprolol  Stable  On Benazepril 40mg  daily, metoprolol 50mg  bid . SBP goal to <160 . Hydralazine 20mg  q6hr prn, labetalol 10mg  q2 hr prn . Long-term BP goal normotensive  Afib/Aflutter on [facility] with HF   On eliquis PTA  Reversed by Kcentra  No antithrombotics due to ICH  Continue po cardizem and metoprolol for rate  control  Cardiology consult appreciated: Rate control adequate.   Continue tele monitoring   EF 45%   Respiratory failure and LLL Pneumonia  Extubated 3/1, vomited post extubation  CCM on board and appreciated  Empiric antibiotics Zosyn,   WBC 8.9 -> 11.2->8.4  tmax 102.8 3/4  Blood cultures obtained 3/4 pending  Sputum culture is pending  On Zosyn   Vomited post extubation 3/1  CCM management is appreciated   Dysphagia  Likely due to hemorrhagic stroke  Failed swallow eval  cortrac for feeding and meds  Hyperlipidemia  Home meds:  crestor 20  LDL 84, goal < 70  May continue statin at discharge  Diabetes type II Uncontrolled  Home meds:  insulin  HgbA1c 9.4, goal < 7.0  CBGs improving 130-177  SSI , Lantus to 20u BID and SSI  Appreciate Diabetes Coordinator recommendations  Elevated Creatinine  Creatinine 1.25 -> 0.65    Lasix given for diuresis   Adequate uop  NS 12ml/hr  Other Stroke Risk Factors  Advanced Age >/= 65   Hx stroke/TIA  Coronary artery disease and MI   Continue EVD management a sper neuro surgery and hopefully clamp tomorrow and Dc over weekend.Management of aspiration pneumonia as aper PCCM.This patient is critically ill and at significant risk of neurological worsening, death and care requires constant monitoring of vital signs, hemodynamics,respiratory and cardiac monitoring, extensive review of multiple databases, frequent neurological assessment, discussion with family, other  specialists and medical decision making of high complexity.I have made any additions or clarifications directly to the above note.This critical care time does not reflect procedure time, or teaching time or supervisory time of PA/NP/Med Resident etc but could involve care discussion time.  I spent 30 minutes of neurocritical care time  in the care of  this patient.   , MD   To contact Stroke Continuity provider, please refer to . After hours, contact General Neurology

## 2020-05-19 NOTE — Progress Notes (Signed)
Progress Note  Patient Name: Jason [name] Date of Encounter: 05/19/2020  [facility] Cardiologist: Dr Acie Fredrickson  Subjective   Mildly responsive; doesn't respond to questions concerning dyspnea or CP  Inpatient Medications    Scheduled Meds: . atorvastatin  40 mg Per NG tube q1800  . benazepril  40 mg Per Tube Daily  . chlorhexidine gluconate (MEDLINE KIT)  15 mL Mouth Rinse BID  . Chlorhexidine Gluconate Cloth  6 each Topical Daily  . digoxin  0.125 mg Intravenous Daily  . diltiazem  60 mg Per Tube Q6H  . enoxaparin (LOVENOX) injection  40 mg Subcutaneous Daily  . feeding supplement (PROSource TF)  45 mL Per Tube TID  . insulin aspart  0-20 Units Subcutaneous Q4H  . insulin aspart  7 Units Subcutaneous Q4H  . insulin glargine  20 Units Subcutaneous BID  . mouth rinse  15 mL Mouth Rinse QID  . metoprolol tartrate  50 mg Per Tube TID  . polyethylene glycol  17 g Per Tube Daily  . senna-docusate  1 tablet Per Tube BID  . thiamine  100 mg Per Tube Daily   Continuous Infusions: . sodium chloride 75 mL/hr at 05/19/20 0600  . diltiazem (CARDIZEM) infusion Stopped (05/18/20 1527)  . feeding supplement (JEVITY 1.5 CAL/FIBER) 1,000 mL (05/18/20 1637)  . piperacillin-tazobactam (ZOSYN)  IV 3.375 g (05/19/20 0604)   PRN Meds: acetaminophen **OR** acetaminophen (TYLENOL) oral liquid 160 mg/5 mL **OR** acetaminophen, hydrALAZINE, labetalol, ondansetron (ZOFRAN) IV   Vital Signs    Vitals:   05/19/20 0300 05/19/20 0400 05/19/20 0500 05/19/20 0600  BP: (!) 153/70 (!) 153/73 (!) 157/71 (!) 156/82  Pulse: 96 97 96 93  Resp: 20 (!) 48 (!) 43 (!) 32  Temp:  (!) 100.6 F (38.1 C)    TempSrc:  Oral    SpO2: 100% 100% 100% 99%  Weight:      Height:        Intake/Output Summary (Last 24 hours) at 05/19/2020 0837 Last data filed at 05/19/2020 0600 Gross per 24 hour  Intake 2368.92 ml  Output 2801 ml  Net -432.08 ml   Last 3 Weights 05/18/2020 05/17/2020 05/16/2020  Weight (lbs) 212 lb  11.9 oz 212 lb 8.4 oz 220 lb 7.4 oz  Weight (kg) 96.5 kg 96.4 kg 100 kg      Telemetry    Atrial flutter, rate controlled- Personally Reviewed   Physical Exam   GEN: NAD Neck: supple Cardiac: irregular Respiratory: CTA anteriorly GI: Soft,  ND MS: No edema Neuro:  lethargic  Labs    Chemistry Recent Labs  Lab 05/15/20 0251 05/16/20 0331 05/17/20 0241 05/18/20 0920  NA 140 138 142 144  K 4.4 4.5 4.6 3.4*  CL 108 102 105 108  CO2 _0 GLUCOSE 169* 326* 229* 184*  BUN 15 32* 18 16  CREATININE 0.88 1.25* 0.65 0.72  CALCIUM 8.2* 8.2* 8.5* 8.2*  PROT 5.2* 5.5*  --  5.9*  ALBUMIN 2.3* 2.3*  --  2.2*  AST 10* 14*  --  40  ALT 10 9  --  16  ALKPHOS 79 70  --  70  BILITOT 0.5 0.6  --  0.6  GFRNONAA >60 >60 >60 >60  ANIONGAP _1 Hematology Recent Labs  Lab 05/16/20 0331 05/17/20 0241 05/18/20 0920  WBC 8.9 11.2* 8.4  RBC 4.72 5.34 5.26  HGB 13.4 15.0 14.7  HCT 41.6 47.8 46.1  MCV 88.1 89.5 87.6  MCH 28.4 28.1 27.9  MCHC 32.2 31.4 31.9  RDW 14.2 14.1 13.9  PLT 119* 129* 166    Radiology    CT HEAD WO CONTRAST  Result Date: 05/19/2020 CLINICAL DATA:  A history of recent stroke with intraventricular hemorrhage and left thalamic hemorrhage EXAM: CT HEAD WITHOUT CONTRAST TECHNIQUE: Contiguous axial images were obtained from the base of the skull through the vertex without intravenous contrast. COMPARISON:  Multiple previous exams the most recent of which is dated 05/18/2020 FINDINGS: Brain: There again noted changes of intraventricular hemorrhage particularly within the occipital horns of the lateral ventricles bilaterally. Ventriculostomy catheter is again noted on the right and stable. The degree of third ventricular hemorrhage and left thalamic parenchymal hemorrhage has resolved similar to that seen on the prior day. No new hemorrhage is seen. Persistent ventricular dilatation is noted. Mild atrophic changes and chronic white matter ischemic  changes are seen. No acute infarct is noted. Vascular: No hyperdense vessel or unexpected calcification. Skull: Normal. Negative for fracture or focal lesion. Sinuses/Orbits: Persistent opacification of the right sphenoid sinus. Other: None IMPRESSION: Persistent intraventricular hemorrhage within the lateral ventricles posteriorly. Ventriculostomy catheter is again identified on the right and stable. No new focal hemorrhage is seen. Chronic atrophic and ischemic changes. Electronically Signed   By: Inez Catalina M.D.   On: 05/19/2020 05:45   CT HEAD WO CONTRAST  Result Date: 05/18/2020 CLINICAL DATA:  Intracranial hemorrhage, follow-up examination EXAM: CT HEAD WITHOUT CONTRAST TECHNIQUE: Contiguous axial images were obtained from the base of the skull through the vertex without intravenous contrast. COMPARISON:  05/13/2020 FINDINGS: Brain: Intraventricular clot within the third ventricle as well as intraparenchymal hematoma within the medial left thalamus have resolved. Layering intraventricular hemorrhage is again seen with the occipital horns of the a lateral ventricles bilaterally. Ventricular size is stable. Right frontal ventriculostomy is again identified with its tip in the region of the foramina [place]. No interval hemorrhage. Parenchymal volume loss is commensurate with the patient's age. Moderate periventricular white matter changes are present. Remote lacunar infarct noted within the right corona radiata and left subinsular white matter. No extra-axial fluid collection identified. Cerebellum is unremarkable. Vascular: No asymmetric hyperdense vasculature at the skull base. Skull: Intact Sinuses/Orbits: There is opacification of the right sphenoid sinus. Remaining paranasal sinuses are clear. Orbits are unremarkable. Other: Mastoid air cells and middle ear cavities are clear. IMPRESSION: Interval resolution of intraventricular clot within the third ventricle and intraparenchymal hematoma within the  left thalamus. Stable volume intraventricular hemorrhage within the occipital horns bilaterally. Right ventriculostomy in place.  Stable ventricular size. Stable senescent changes with bilateral remote infarcts as outlined above. Electronically Signed   By: Fidela Salisbury MD   On: 05/18/2020 04:53   DG CHEST PORT 1 VIEW  Result Date: 05/17/2020 CLINICAL DATA:  Aspiration into airway EXAM: PORTABLE CHEST 1 VIEW COMPARISON:  05/12/2020 FINDINGS: Prior CABG. Cardiomegaly. Bilateral airspace opacities are noted, left greater than right. These have increased since prior study. No visible effusions or pneumothorax. Interval extubation. IMPRESSION: Worsening bilateral airspace disease, left greater than right concerning for pneumonia. Mild cardiomegaly. Electronically Signed   By: Rolm Baptise M.D.   On: 05/17/2020 10:48   DG Abd Portable 1V  Result Date: 05/17/2020 CLINICAL DATA:  Feeding tube placement. EXAM: PORTABLE ABDOMEN - 1 VIEW COMPARISON:  May 12, 2020. FINDINGS: The bowel gas pattern is normal. Distal tip of feeding tube is seen within the stomach. No radio-opaque calculi or  other significant radiographic abnormality are seen. IMPRESSION: Distal tip of feeding tube seen within the stomach. No evidence of bowel obstruction or ileus. Electronically Signed   By: Marijo Conception M.D.   On: 05/17/2020 12:03    Patient Profile     70 y.o. male with past medical history of paroxysmal atrial fibrillation, coronary artery disease status post coronary artery bypass and graft, prior CVA, diabetes mellitus, hypertension, recurrent falls admitted with intracranial hemorrhage being seen for atrial flutter.  Echocardiogram shows ejection fraction 45 to 50%.  Assessment & Plan    1 atrial flutter-patient remains in atrial flutter.  Heart rate is reasonably well controlled.  We will continue Cardizem and metoprolol.  Discontinue digoxin.  If heart rate increases could advance Cardizem or metoprolol as  tolerated.  He is not a candidate for anticoagulation given recent intracranial hemorrhage.  2 hypertension-blood pressure increasing.  As outlined above we are discontinuing digoxin.  Can increase Cardizem or metoprolol as needed.  3 status post intracranial hemorrhage-Per neurosurgery.  4 coronary artery disease-resume statin at discharge.  No aspirin given intracranial hemorrhage.    For questions or updates, please contact [place] Please consult [email or web address] for contact info under        Signed, Kirk Ruths, MD  05/19/2020, 8:37 AM

## 2020-05-20 ENCOUNTER — Inpatient Hospital Stay (HOSPITAL_COMMUNITY): Payer: Medicare HMO

## 2020-05-20 DIAGNOSIS — Z9911 Dependence on respirator [ventilator] status: Secondary | ICD-10-CM

## 2020-05-20 DIAGNOSIS — Z978 Presence of other specified devices: Secondary | ICD-10-CM

## 2020-05-20 DIAGNOSIS — I4892 Unspecified atrial flutter: Secondary | ICD-10-CM | POA: Diagnosis not present

## 2020-05-20 DIAGNOSIS — J9602 Acute respiratory failure with hypercapnia: Secondary | ICD-10-CM

## 2020-05-20 DIAGNOSIS — I483 Typical atrial flutter: Secondary | ICD-10-CM | POA: Diagnosis not present

## 2020-05-20 DIAGNOSIS — R4182 Altered mental status, unspecified: Secondary | ICD-10-CM | POA: Diagnosis not present

## 2020-05-20 DIAGNOSIS — D649 Anemia, unspecified: Secondary | ICD-10-CM

## 2020-05-20 DIAGNOSIS — J9601 Acute respiratory failure with hypoxia: Secondary | ICD-10-CM | POA: Diagnosis not present

## 2020-05-20 DIAGNOSIS — I629 Nontraumatic intracranial hemorrhage, unspecified: Secondary | ICD-10-CM | POA: Diagnosis not present

## 2020-05-20 LAB — POCT I-STAT 7, (LYTES, BLD GAS, ICA,H+H)
Acid-Base Excess: 10 mmol/L — ABNORMAL HIGH (ref 0.0–2.0)
Acid-Base Excess: 14 mmol/L — ABNORMAL HIGH (ref 0.0–2.0)
Bicarbonate: 34.1 mmol/L — ABNORMAL HIGH (ref 20.0–28.0)
Bicarbonate: 34.5 mmol/L — ABNORMAL HIGH (ref 20.0–28.0)
Calcium, Ion: 1.17 mmol/L (ref 1.15–1.40)
Calcium, Ion: 1.1 mmol/L — ABNORMAL LOW (ref 1.15–1.40)
HCT: 45 % (ref 39.0–52.0)
HCT: 42 % (ref 39.0–52.0)
Hemoglobin: 15.3 g/dL (ref 13.0–17.0)
Hemoglobin: 14.3 g/dL (ref 13.0–17.0)
O2 Saturation: 98 %
O2 Saturation: 100 %
Patient temperature: 101.5
Patient temperature: 102.1
Potassium: 3.8 mmol/L (ref 3.5–5.1)
Potassium: 3.6 mmol/L (ref 3.5–5.1)
Sodium: 143 mmol/L (ref 135–145)
Sodium: 140 mmol/L (ref 135–145)
TCO2: 35 mmol/L — ABNORMAL HIGH (ref 22–32)
TCO2: 35 mmol/L — ABNORMAL HIGH (ref 22–32)
pCO2 arterial: 46.9 mmHg (ref 32.0–48.0)
pCO2 arterial: 30.8 mmHg — ABNORMAL LOW (ref 32.0–48.0)
pH, Arterial: 7.476 — ABNORMAL HIGH (ref 7.350–7.450)
pH, Arterial: 7.663 (ref 7.350–7.450)
pO2, Arterial: 108 mmHg (ref 83.0–108.0)
pO2, Arterial: 370 mmHg — ABNORMAL HIGH (ref 83.0–108.0)

## 2020-05-20 LAB — CBC
HCT: 53.4 % — ABNORMAL HIGH (ref 39.0–52.0)
Hemoglobin: 16.7 g/dL (ref 13.0–17.0)
MCH: 27.6 pg (ref 26.0–34.0)
MCHC: 31.3 g/dL (ref 30.0–36.0)
MCV: 88.4 fL (ref 80.0–100.0)
Platelets: 196 10*3/uL (ref 150–400)
RBC: 6.04 MIL/uL — ABNORMAL HIGH (ref 4.22–5.81)
RDW: 14.2 % (ref 11.5–15.5)
WBC: 9.2 10*3/uL (ref 4.0–10.5)
nRBC: 0 % (ref 0.0–0.2)

## 2020-05-20 LAB — BASIC METABOLIC PANEL
Anion gap: 11 (ref 5–15)
BUN: 22 mg/dL (ref 8–23)
CO2: 27 mmol/L (ref 22–32)
Calcium: 8.7 mg/dL — ABNORMAL LOW (ref 8.9–10.3)
Chloride: 104 mmol/L (ref 98–111)
Creatinine, Ser: 0.83 mg/dL (ref 0.61–1.24)
GFR, Estimated: >60 mL/min (ref >60)
Glucose, Bld: 151 mg/dL — ABNORMAL HIGH (ref 70–99)
Potassium: 4.7 mmol/L (ref 3.5–5.1)
Sodium: 142 mmol/L (ref 135–145)

## 2020-05-20 LAB — CULTURE, RESPIRATORY W GRAM STAIN: Culture: NORMAL

## 2020-05-20 LAB — GLUCOSE, CAPILLARY
Glucose-Capillary: 107 mg/dL — ABNORMAL HIGH (ref 70–99)
Glucose-Capillary: 115 mg/dL — ABNORMAL HIGH (ref 70–99)
Glucose-Capillary: 151 mg/dL — ABNORMAL HIGH (ref 70–99)
Glucose-Capillary: 153 mg/dL — ABNORMAL HIGH (ref 70–99)
Glucose-Capillary: 163 mg/dL — ABNORMAL HIGH (ref 70–99)
Glucose-Capillary: 87 mg/dL (ref 70–99)

## 2020-05-20 MED ORDER — VANCOMYCIN HCL 2000 MG/400ML IV SOLN
2000.0000 mg | Freq: Once | INTRAVENOUS | Status: AC
  Administered 2020-05-20: 2000 mg via INTRAVENOUS
  Filled 2020-05-20: qty 400

## 2020-05-20 MED ORDER — SODIUM CHLORIDE 0.9 % IV BOLUS
1000.0000 mL | Freq: Once | INTRAVENOUS | Status: AC
  Administered 2020-05-20: 1000 mL via INTRAVENOUS

## 2020-05-20 MED ORDER — CHLORHEXIDINE GLUCONATE 0.12% ORAL RINSE (MEDLINE KIT)
15.0000 mL | Freq: Two times a day (BID) | OROMUCOSAL | Status: DC
  Administered 2020-05-20 – 2020-06-08 (×36): 15 mL via OROMUCOSAL

## 2020-05-20 MED ORDER — FENTANYL CITRATE (PF) 100 MCG/2ML IJ SOLN
INTRAMUSCULAR | Status: AC
  Administered 2020-05-20: 100 ug
  Filled 2020-05-20: qty 2

## 2020-05-20 MED ORDER — DOCUSATE SODIUM 50 MG/5ML PO LIQD: 100.0000 mg | Freq: Two times a day (BID) | ORAL | Status: DC

## 2020-05-20 MED ORDER — FENTANYL CITRATE (PF) 100 MCG/2ML IJ SOLN
25.0000 ug | INTRAMUSCULAR | Status: DC | PRN
Start: 2020-05-20 — End: 2020-05-25
  Administered 2020-05-23: 100 ug via INTRAVENOUS
  Filled 2020-05-20: qty 2

## 2020-05-20 MED ORDER — FENTANYL CITRATE (PF) 100 MCG/2ML IJ SOLN: 25.0000 ug | INTRAMUSCULAR | Status: DC | PRN

## 2020-05-20 MED ORDER — PROPOFOL 1000 MG/100ML IV EMUL
INTRAVENOUS | Status: AC
  Filled 2020-05-20: qty 100

## 2020-05-20 MED ORDER — MIDAZOLAM HCL 2 MG/2ML IJ SOLN
INTRAMUSCULAR | Status: AC
  Filled 2020-05-20: qty 4

## 2020-05-20 MED ORDER — INSULIN ASPART 100 UNIT/ML ~~LOC~~ SOLN
5.0000 [IU] | SUBCUTANEOUS | Status: DC
  Administered 2020-05-20 – 2020-05-23 (×16): 5 [IU] via SUBCUTANEOUS

## 2020-05-20 MED ORDER — VANCOMYCIN HCL 1750 MG/350ML IV SOLN
1750.0000 mg | INTRAVENOUS | Status: DC
  Administered 2020-05-21 – 2020-05-23 (×3): 1750 mg via INTRAVENOUS
  Filled 2020-05-20 (×4): qty 350

## 2020-05-20 MED ORDER — NOREPINEPHRINE 4 MG/250ML-% IV SOLN: 2.0000 ug/min | INTRAVENOUS | Status: DC

## 2020-05-20 MED ORDER — PANTOPRAZOLE SODIUM 40 MG PO PACK
40.0000 mg | PACK | Freq: Every day | ORAL | Status: DC
  Administered 2020-05-20 – 2020-06-07 (×18): 40 mg
  Filled 2020-05-20 (×20): qty 20

## 2020-05-20 MED ORDER — SODIUM CHLORIDE 0.9 % IV SOLN: 250.0000 mL | INTRAVENOUS | Status: DC

## 2020-05-20 MED ORDER — POLYETHYLENE GLYCOL 3350 17 G PO PACK: 17.0000 g | PACK | Freq: Every day | ORAL | Status: DC

## 2020-05-20 MED ORDER — PROPOFOL 1000 MG/100ML IV EMUL
0.0000 ug/kg/min | INTRAVENOUS | Status: DC
  Administered 2020-05-20: 10 ug/kg/min via INTRAVENOUS
  Administered 2020-05-20: 30 ug/kg/min via INTRAVENOUS
  Administered 2020-05-21: 20 ug/kg/min via INTRAVENOUS
  Filled 2020-05-20 (×3): qty 100

## 2020-05-20 MED ORDER — FENTANYL CITRATE (PF) 100 MCG/2ML IJ SOLN
100.0000 ug | Freq: Once | INTRAMUSCULAR | Status: DC
Start: 2020-05-20 — End: 2020-05-25

## 2020-05-20 MED ORDER — ROCURONIUM BROMIDE 10 MG/ML (PF) SYRINGE
PREFILLED_SYRINGE | INTRAVENOUS | Status: AC
  Administered 2020-05-20: 100 mg
  Filled 2020-05-20: qty 10

## 2020-05-20 MED ORDER — ORAL CARE MOUTH RINSE
15.0000 mL | OROMUCOSAL | Status: DC
  Administered 2020-05-20 – 2020-05-23 (×31): 15 mL via OROMUCOSAL

## 2020-05-20 NOTE — Progress Notes (Signed)
Jason [name] continued to have sonorous respirations and tachypnea with shallow breathing despite attempted interventions. Attempted to call son [name], no answer. Left a message for him to call back for an update.  Steffanie Dunn, DO 05/20/20 11:29 AM [facility]  From 7AM- 7PM if no response to pager, please call [phone]. After hours, 7PM- 7AM, please call Elink  [phone].

## 2020-05-20 NOTE — Progress Notes (Signed)
NAME:  Jason [name], MRN:  [id number], DOB:  [birth year], LOS: 9 ADMISSION DATE:  05/11/2020, CONSULTATION DATE:  2/25 REFERRING MD:  Dr. Iver Nestle Neurology, CHIEF COMPLAINT:  AMS  Brief History:  70 year old male admitted with hemorrhagic stroke ultimately requiring intubation for airway protection and EVD placed by neurosurgery.   History of Present Illness:  Patient is encephalopathic and/or intubated. Therefore history has been obtained from chart review.   70 year old male with PMH as below, which is significant for CAD, DM, HTN, PAF on AC, and CVA. He was last known well 2/23, and 2/24 in the morning he was found to be confused "talking out of his head". EMS was apparently called, but he was not initially transported to the emergency department for some reason. His mental status worsened and EMS was called again, this time transporting the patient to [facility] ED. In the ED he was noted to be febrile prompting an infectious workup. CT of the head showed ICH with intraventricular extension with concern for hydrocephalus. SBP on presentation 205. Theodoro Parma was given for Eliquis reversal. He was admitted by neurology to ICU where unfortunately his mental status continued to decline. PCCM was consulted for intubation at the same time neurology was consulted for ventriculostomy placement.   Past Medical History:    has a past medical history of Anxiety, Coronary artery disease, Depression, Diabetes mellitus without complication (HCC), Dysrhythmia, Hypertension, Myocardial infarction [facility]), and Stroke (HCC).   Significant Hospital Events:  2/24 admitted for [facility] 2/25 Intubated, EVD placed.  3/1 extubated 3/2 EVD clamped, however worsening mental status so reopened  Consults:  Neurosurgery  Procedures:  ETT 2/25 >3/1 EVD 2/25 >  Significant Diagnostic Tests:   CT head 2/24 > 1.2cm thalamic hemorrhage with intraventricular extension. Chronic right corona radiata lacunar insult.   CT head  2/25 > Medial left thalamic intraparenchymal hemorrhage appears the same, approximately 1 cm in size. Extension into the ventricular system with the amount of intraventricular blood being very similar. Ventriculomegaly appears the same without further dilatation.  Echocardiogram 05/12/20 estimation, is 45 to 50%.The left ventricle has no  regional wall motion abnormalities.  Micro Data:  Blood 2/24 > no growth final Urine 2/24 > no  growth final  Antimicrobials:  Cefazolin 2/24> 3/1 Rocephin 3/1-3/2 Zosyn 3/3>  Interim History / Subjective:  Febrile. He has been tachypneic overnight, but remains alert and denies dyspnea.  Objective   Blood pressure (!) 145/82, pulse (!) 101, temperature (!) 101.5 F (38.6 C), resp. rate (!) 56, height 5\' 10"  (1.778 m), weight 96.5 kg, SpO2 100 %.        Intake/Output Summary (Last 24 hours) at 05/20/2020 0926 Last data filed at 05/20/2020 0800 Gross per 24 hour  Intake 1346.75 ml  Output 2248 ml  Net -901.25 ml   Filed Weights   05/16/20 0402 05/17/20 0454 05/18/20 0437  Weight: 100 kg 96.4 kg 96.5 kg   Physical exam General: critically ill appearing man laying in bed in NAD HEENT: EVD in place, no erythema around site. Not draining fluid currently. Neuro: Alert, tracking, moving right side to follow commands. Answering yes and no questions, giving short verbal responses.  CV: S1S2, RRR PULM: tachypnea, reduced in bilateral bases, no rhales or wheezing. No rhonchi.  GI: soft, bowel sounds active in all 4 quadrants, non-tender, non-distended, tolerating TF Extremities: warm/dry, no edema  Skin: no rashes or lesions  Resolved Hospital Problem list   Hypokalemia   Assessment &  Plan:   Intraparenchymal hemorrhage  -Likely hypertensive in nature. Anticoagulated on Eliquis, KCentra given in ED. -Left thalamic with intraventricular extension. Likely hypertensive in nature. -EVD placed 2/25 P: -Management per neurology/ neurosurgery  -EVD  management per neurosurgery  -Neuro protective measures- avoid fevers, HOB >30 degrees, normoglycemia -enteral nutrition until swallowing improves.  Hypertensive emergency -BP on arrival was 205/120 P: -Continue enteral metoprolol, diltiazem, and benazepril -PRN IV antihypertensives  -goal SBP <160  Acute respiratory failure with hypoxia Likely aspiration pneumonia  -Extubated 3/1 with a few episodes of vomiting post extubation -T-max 101.1 overnight with left lower lobe infiltrate  P: -zosyn x 7 days; con't to follow respiratory culture -supplemental O2 as required to maintain O2 >90%; remains high risk for reintubation due to tachypnea -Chest PT -Encourage pulmonary hygiene; NTS PRN -follow culture until finalized  -given ongoing fevers, need to recheck MRSA nares and may need to add vancomycin  Atrial fibrillation on Eliquis -Converted into atrial flutter 2/25, rate 60-80 CAD Chronic HFrEF, EF 45% - appreciate cardiology consultation. Will continue with rate control P: -Cardiology following, appreciate their assistance - stopped digoxin. Con't diltiazem and metoprolol. -Not a candidate for anticoagulation currently given bleed  -Continuous telemetry  -maintain euvolemia  DM2 with controlled hyperglycemia  -Hgb A1C 9.4 P: -con't lantus 25 units BID -decrease TF coverage to 5 units Q4h -SSI PRN -goal BG 140-180  QTc prolongation P: -Avoid QTc prolonging meds  Best practice (evaluated daily)  Diet: NPO, Tube feeds at goal Pain/Anxiety/Delirium protocol (if indicated): n/a VAP protocol (if indicated): n/a DVT prophylaxis: lovenox GI prophylaxis: n/a Glucose control: SSI Mobility: PT OT, SLP Disposition: ICU- high risk for reintubation  Goals of Care:  Last date of multidisciplinary goals of care discussion: per primary Family and staff present:  Summary of discussion:  Follow up goals of care discussion due: 3/4 Code Status: FULL  Labs    CBC: Recent Labs  Lab 05/14/20 0526 05/15/20 0251 05/16/20 0331 05/17/20 0241 05/18/20 0920 05/20/20 0318 05/20/20 0803  WBC 7.4 8.5 8.9 11.2* 8.4 9.2  --   NEUTROABS 4.8 6.1 6.6  --  6.1  --   --   HGB 14.0 13.3 13.4 15.0 14.7 16.7 15.3  HCT 42.4 42.5 41.6 47.8 46.1 53.4* 45.0  MCV 87.4 89.9 88.1 89.5 87.6 88.4  --   PLT 106* 109* 119* 129* 166 196  --     Basic Metabolic Panel: Recent Labs  Lab 05/13/20 1721 05/14/20 0526 05/14/20 0526 05/15/20 0251 05/16/20 0331 05/17/20 0241 05/18/20 0920 05/20/20 0318 05/20/20 0803  NA  --  140   < > 140 138 142 144 142 143  K  --  4.5   < > 4.4 4.5 4.6 3.4* 4.7 3.8  CL  --  106   < > 108 102 105 108 104  --   CO2  --  25   < > 25 27 26 26 27   --   GLUCOSE  --  322*   < > 169* 326* 229* 184* 151*  --   BUN  --  12   < > 15 32* 18 16 22   --   CREATININE  --  0.66   < > 0.88 1.25* 0.65 0.72 0.83  --   CALCIUM  --  8.3*   < > 8.2* 8.2* 8.5* 8.2* 8.7*  --   MG 2.1 2.1  --   --   --  2.2  --   --   --  PHOS 3.2 3.9  --   --   --  3.1  --   --   --    < > = values in this interval not displayed.   GFR: Estimated Creatinine Clearance: 95.1 mL/min (by C-G formula based on SCr of 0.83 mg/dL). Recent Labs  Lab 05/16/20 0331 05/17/20 0241 05/18/20 0920 05/20/20 0318  WBC 8.9 11.2* 8.4 9.2    Liver Function Tests: Recent Labs  Lab 05/14/20 0526 05/15/20 0251 05/16/20 0331 05/18/20 0920  AST 10* 10* 14* 40  ALT 12 10 9 16   ALKPHOS 90 79 70 70  BILITOT 0.5 0.5 0.6 0.6  PROT 5.1* 5.2* 5.5* 5.9*  ALBUMIN 2.3* 2.3* 2.3* 2.2*   No results for input(s): LIPASE, AMYLASE in the last 168 hours. No results for input(s): AMMONIA in the last 168 hours.  ABG    Component Value Date/Time   PHART 7.476 (H) 05/20/2020 0803   PCO2ART 46.9 05/20/2020 0803   PO2ART 108 05/20/2020 0803   HCO3 34.1 (H) 05/20/2020 0803   TCO2 35 (H) 05/20/2020 0803   ACIDBASEDEF 15.0 (H) 02/26/2020 1450   O2SAT 98.0 05/20/2020 0803      Coagulation Profile: Recent Labs  Lab 05/13/20 1721  INR 1.1    Cardiac Enzymes: No results for input(s): CKTOTAL, CKMB, CKMBINDEX, TROPONINI in the last 168 hours.  HbA1C: Hgb A1c MFr Bld  Date/Time Value Ref Range Status  05/11/2020 11:40 PM 9.4 (H) 4.8 - 5.6 % Final    Comment:    (NOTE) Pre diabetes:          5.7%-6.4%  Diabetes:              >6.4%  Glycemic control for   <7.0% adults with diabetes   02/26/2020 07:43 PM 12.8 (H) 4.8 - 5.6 % Final    Comment:    (NOTE) Pre diabetes:          5.7%-6.4%  Diabetes:              >6.4%  Glycemic control for   <7.0% adults with diabetes     CBG: Recent Labs  Lab 05/19/20 1548 05/19/20 1937 05/19/20 2336 05/20/20 0321 05/20/20 0758  GLUCAP 130* 158* 176* 153* 87    This patient is critically ill with multiple organ system failure which requires frequent high complexity decision making, assessment, support, evaluation, and titration of therapies. This was completed through the application of advanced monitoring technologies and extensive interpretation of multiple databases. During this encounter critical care time was devoted to patient care services described in this note for 41 minutes.  07/20/20, DO 05/20/20 10:06 AM [facility]  From 7AM- 7PM if no response to pager, please call [phone]. After hours, 7PM- 7AM, please call Elink  [phone].

## 2020-05-20 NOTE — Progress Notes (Signed)
Pharmacy Antibiotic Note  Jason [name] is a 70 y.o. male admitted on 05/11/2020 with ICU. Patient continuing on antibiotics while EVD in place, escalated to Zosyn on 3/3. Possible aspiration on 3/1. Pharmacy has been consulted for vancomycin dosing due to continued fevers. SCr stable at 0.83.  Plan: Vancomycin 2g IV x 1; then 1750mg  IV q24h. Goal AUC 400-550. Expected AUC: 488 SCr used: 0.83 Zosyn 3.375g IV q8h (4h infusion) Monitor clinical progress, c/s, renal function F/u de-escalation plan/LOT, vancomycin levels as indicated   Height: 5\' 10"  (177.8 cm) Weight: 96.5 kg (212 lb 11.9 oz) IBW/kg (Calculated) : 73  Temp (24hrs), Avg:100.1 F (37.8 C), Min:97.8 F (36.6 C), Max:102.1 F (38.9 C)  Recent Labs  Lab 05/15/20 0251 05/16/20 0331 05/17/20 0241 05/18/20 0920 05/20/20 0318  WBC 8.5 8.9 11.2* 8.4 9.2  CREATININE 0.88 1.25* 0.65 0.72 0.83    Estimated Creatinine Clearance: 95.1 mL/min (by C-G formula based on SCr of 0.83 mg/dL).    No Known Allergies   07/18/20, PharmD, BCPS Please check AMION for all [facility] contact numbers Clinical Pharmacist 05/20/2020 9:50 AM

## 2020-05-20 NOTE — Progress Notes (Signed)
STROKE TEAM PROGRESS NOTE   SUBJECTIVE (INTERVAL HISTORY) Still febrile 101.5, and EVD accidentally dislodged. NSG has stitched the EVD site. Continue to have tachypnea and CCM on board, plan to intubate pt.     Vitals:   05/19/20 0300 05/19/20 0400 05/19/20 0500 05/19/20 0600  BP: (!) 153/70 (!) 153/73 (!) 157/71 (!) 156/82  Pulse: 96 97 96 93  Resp: 20 (!) 48 (!) 43 (!) 32  Temp:  (!) 100.6 F (38.1 C)    TempSrc:  Oral    SpO2: 100% 100% 100% 99%  Weight:      Height:       CBC:  Recent Labs  Lab 05/16/20 0331 05/17/20 0241 05/18/20 0920  WBC 8.9 11.2* 8.4  NEUTROABS 6.6  --  6.1  HGB 13.4 15.0 14.7  HCT 41.6 47.8 46.1  MCV 88.1 89.5 87.6  PLT 119* 129* 166   Basic Metabolic Panel:  Recent Labs  Lab 05/14/20 0526 05/15/20 0251 05/17/20 0241 05/18/20 0920  NA 140   < > 142 144  K 4.5   < > 4.6 3.4*  CL 106   < > 105 108  CO2 25   < > 26 26  GLUCOSE 322*   < > 229* 184*  BUN 12   < > 18 16  CREATININE 0.66   < > 0.65 0.72  CALCIUM 8.3*   < > 8.5* 8.2*  MG 2.1  --  2.2  --   PHOS 3.9  --  3.1  --    < > = values in this interval not displayed.   Lipid Panel:  No results for input(s): CHOL, TRIG, HDL, CHOLHDL, VLDL, LDLCALC in the last 168 hours. HgbA1c:  No results for input(s): HGBA1C in the last 168 hours. Urine Drug Screen:  No results for input(s): LABOPIA, COCAINSCRNUR, LABBENZ, AMPHETMU, THCU, LABBARB in the last 168 hours.  Alcohol Level No results for input(s): ETH in the last 168 hours.  IMAGING past 24 hours EEG adult  Result Date: 05/14/2020  IMPRESSION: This study is suggestive of moderate to evere diffuse encephalopathy, nonspecific etiology. No seizures or epileptiform discharges were seen throughout the recording.  PHYSICAL EXAM   Temp:  [97.8 F (36.6 C)-102.1 F (38.9 C)] 101.5 F (38.6 C) (03/05 0800) Pulse Rate:  [47-101] 101 (03/05 0800) Resp:  [14-56] 56 (03/05 0800) BP: (134-187)/(59-136) 145/82 (03/05 0800) SpO2:  [98  %-100 %] 100 % (03/05 0800)  General - Well nourished, well developed elderly male, tachypnea and respiratory distress  Ophthalmologic - fundi not visualized due to noncooperation.  Cardiovascular - A. flutter with regular heart rate.  Neuro - Patient is lethargic but eyes open.  Follows simple midline commands but not peripheral commands, nonverbal, not name or repeat.  Able to track bilaterally, b/l incomplete gaze.  Blinks to threat bilaterally.  Right facial mild weakness.  Tongue midline.  Is able to move BUE extremities against gravity, finger grip symmetrical 3+/5. BLE proximal withdraw 2+/5 on pain stimulation. DTR 1+ and no babinski. Sensation not cooperative but brisk withdraw with moaning on pain stimulation.    ASSESSMENT/PLAN [name] is a 70 y.o. male with history of HTN, DM, CAD/MI, afib on eliquis presenting with AMS, garbled speech, N/V and behavior change.   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 Including filling both the third ventricle with layering blood in the fourth and lateral ventricle  2D Echo EF 45-50%, No shunt  LDL 84  HgbA1c 9.4  VTE prophylaxis - SCDs, Lovenox 40mg  daily  Eliquis (apixaban) daily prior to admission, now on No antithrombotic  Therapy recommendations:  SNF  Disposition:  Pending   IVH  S/p EVD  2/27 Neurosurgery instilled tPA into EVD  3/1 EVD weaned to 20 cm H20, ICPs stable  3/2 failed clamping trial  3/5 EVD accidentally dislodged  NSG following  CT head pending in am  Hypertensive emergency  Home meds:  Lotensin, metoprolol  Stable  On  Benazepril 40mg  daily, metoprolol 50mg  bid, cardizem 60 Q6 . Long-term BP goal normotensive  Afib/Aflutter on [facility] with HF   On eliquis PTA  Reversed by Kcentra  No antithrombotics due to ICH  Continue po cardizem and metoprolol for rate  control  Cardiology consult appreciated: Rate control adequate.   Continue tele monitoring   EF 45%   Respiratory failure and LLL Pneumonia  Extubated 3/1, vomited post extubation  Tachypnea re-intubated 3/5  CCM on board and appreciated  Empiric antibiotics Zosyn and vanco   WBC 8.9 -> 11.2->8.4->9.2  Tmax 102.8->afebrile  Blood cultures 3/4 pending  Sputum culture pending   Dysphagia  Likely due to hemorrhagic stroke  Failed swallow eval  On TF  Speech on board  Hyperlipidemia  Home meds:  crestor 20  LDL 84, goal < 70  May continue statin at discharge  Diabetes type II Uncontrolled  Home meds:  insulin  HgbA1c 9.4, goal < 7.0  CBGs  SSI  On Lantus to 20->25u BID  Appreciate Diabetes Coordinator recommendations  Other Stroke Risk Factors  Advanced Age >/= 41   Hx stroke/TIA  Coronary artery disease and MI   Hospital day #9  This patient is critically ill due to respiratory failure, left ICH with IVH, hydrocephalus, Afib, hyperglycemia, fever and at significant risk of neurological worsening, death form hematoma expansion, obstructive hydrocephalus, sepsis. This patient's care requires constant monitoring of vital signs, hemodynamics, respiratory and cardiac monitoring, review of multiple databases, neurological assessment, discussion with family, other specialists and medical decision making of high complexity. I spent 40 minutes of neurocritical care time in the care of this patient.  , MD PhD Stroke Neurology 05/20/2020 11:42 PM  To contact Stroke Continuity provider, please refer to 76. After hours, contact General Neurology

## 2020-05-20 NOTE — Progress Notes (Signed)
eLink Physician-Brief Progress Note Patient Name: Jason [name] DOB: Oct 28, 1950 MRN: [id number]   Date of Service  05/20/2020  HPI/Events of Note  Patient is a bit more tachypneic than his baseline over the past few days, he appears generally debilitated, and possibly atelectatic.  eICU Interventions  Stat ABG ordered to assess respiratory status. Bedside instructed to notify MD of results as soon as they are available.         Thomasene Lot Ogan 05/20/2020, 6:32 AM

## 2020-05-20 NOTE — Progress Notes (Addendum)
Progress Note  Patient Name: Jason [name] Date of Encounter: 05/20/2020  [facility] Cardiologist: No primary care provider on file.   Subjective   Intubated now. Was with increased work of breathing prior  Inpatient Medications    Scheduled Meds: . midazolam      . atorvastatin  40 mg Per NG tube q1800  . benazepril  40 mg Per Tube Daily  . chlorhexidine gluconate (MEDLINE KIT)  15 mL Mouth Rinse BID  . Chlorhexidine Gluconate Cloth  6 each Topical Daily  . diltiazem  60 mg Per Tube Q6H  . enoxaparin (LOVENOX) injection  40 mg Subcutaneous Daily  . feeding supplement (PROSource TF)  45 mL Per Tube TID  . insulin aspart  0-20 Units Subcutaneous Q4H  . insulin aspart  5 Units Subcutaneous Q4H  . insulin glargine  25 Units Subcutaneous BID  . mouth rinse  15 mL Mouth Rinse QID  . metoprolol tartrate  100 mg Per Tube TID  . polyethylene glycol  17 g Per Tube Daily  . senna-docusate  1 tablet Per Tube BID  . thiamine  100 mg Per Tube Daily   Continuous Infusions: . feeding supplement (JEVITY 1.5 CAL/FIBER) 1,000 mL (05/19/20 1608)  . piperacillin-tazobactam (ZOSYN)  IV 12.5 mL/hr at 05/20/20 0600  . [START ON 05/21/2020] vancomycin    . vancomycin     PRN Meds: acetaminophen **OR** acetaminophen (TYLENOL) oral liquid 160 mg/5 mL **OR** acetaminophen, hydrALAZINE, labetalol, ondansetron (ZOFRAN) IV   Vital Signs    Vitals:   05/20/20 0500 05/20/20 0600 05/20/20 0700 05/20/20 0800  BP: (!) 149/83 (!) 165/82 134/75 (!) 145/82  Pulse: 100 100 99 (!) 101  Resp: (!) 42 (!) 43 (!) 46 (!) 56  Temp:    (!) 101.5 F (38.6 C)  TempSrc:      SpO2: 100% 99% 100% 100%  Weight:      Height:        Intake/Output Summary (Last 24 hours) at 05/20/2020 1123 Last data filed at 05/20/2020 0900 Gross per 24 hour  Intake 1222.82 ml  Output 2153 ml  Net -930.18 ml   Last 3 Weights 05/18/2020 05/17/2020 05/16/2020  Weight (lbs) 212 lb 11.9 oz 212 lb 8.4 oz 220 lb 7.4 oz  Weight (kg) 96.5 kg  96.4 kg 100 kg      Telemetry    Atrial flutter, rate controlled- Personally Reviewed  ECG    Atrial flutter- Personally Reviewed  Physical Exam   GEN:  Ill-appearing ET tube in place Neck: No JVD Cardiac: RRR/atrial flutter regular, no murmurs, rubs, or gallops.  Respiratory: Clear to auscultation bilaterally. GI: Soft, nontender, non-distended  MS: No edema; No deformity. Neuro:   Unable Psych: Unable  Labs    High Sensitivity Troponin:  No results for input(s): TROPONINIHS in the last 720 hours.    Chemistry Recent Labs  Lab 05/15/20 0251 05/16/20 0331 05/17/20 0241 05/18/20 0920 05/20/20 0318 05/20/20 0803  NA 140 138 142 144 142 143  K 4.4 4.5 4.6 3.4* 4.7 3.8  CL 108 102 105 108 104  --   CO2 _0 --   GLUCOSE 169* 326* 229* 184* 151*  --   BUN 15 32* _1 --   CREATININE 0.88 1.25* 0.65 0.72 0.83  --   CALCIUM 8.2* 8.2* 8.5* 8.2* 8.7*  --   PROT 5.2* 5.5*  --  5.9*  --   --   ALBUMIN  2.3* 2.3*  --  2.2*  --   --   AST 10* 14*  --  40  --   --   ALT 10 9  --  16  --   --   ALKPHOS 79 70  --  70  --   --   BILITOT 0.5 0.6  --  0.6  --   --   GFRNONAA >60 >60 >60 >60 >60  --   ANIONGAP _0 --      Hematology Recent Labs  Lab 05/17/20 0241 05/18/20 0920 05/20/20 0318 05/20/20 0803  WBC 11.2* 8.4 9.2  --   RBC 5.34 5.26 6.04*  --   HGB 15.0 14.7 16.7 15.3  HCT 47.8 46.1 53.4* 45.0  MCV 89.5 87.6 88.4  --   MCH 28.1 27.9 27.6  --   MCHC 31.4 31.9 31.3  --   RDW 14.1 13.9 14.2  --   PLT 129* 166 196  --     BNP Recent Labs  Lab 05/17/20 1222  BNP 662.1*     DDimer No results for input(s): DDIMER in the last 168 hours.   Radiology    CT HEAD WO CONTRAST  Result Date: 05/19/2020 CLINICAL DATA:  A history of recent stroke with intraventricular hemorrhage and left thalamic hemorrhage EXAM: CT HEAD WITHOUT CONTRAST TECHNIQUE: Contiguous axial images were obtained from the base of the skull through the vertex  without intravenous contrast. COMPARISON:  Multiple previous exams the most recent of which is dated 05/18/2020 FINDINGS: Brain: There again noted changes of intraventricular hemorrhage particularly within the occipital horns of the lateral ventricles bilaterally. Ventriculostomy catheter is again noted on the right and stable. The degree of third ventricular hemorrhage and left thalamic parenchymal hemorrhage has resolved similar to that seen on the prior day. No new hemorrhage is seen. Persistent ventricular dilatation is noted. Mild atrophic changes and chronic white matter ischemic changes are seen. No acute infarct is noted. Vascular: No hyperdense vessel or unexpected calcification. Skull: Normal. Negative for fracture or focal lesion. Sinuses/Orbits: Persistent opacification of the right sphenoid sinus. Other: None IMPRESSION: Persistent intraventricular hemorrhage within the lateral ventricles posteriorly. Ventriculostomy catheter is again identified on the right and stable. No new focal hemorrhage is seen. Chronic atrophic and ischemic changes. Electronically Signed   By: Inez Catalina M.D.   On: 05/19/2020 05:45    Cardiac Studies   Echocardiogram 05/12/2020: 1. Left ventricular ejection fraction, by estimation, is 45 to 50%. The  left ventricle has mildly decreased function. The left ventricle has no  regional wall motion abnormalities. Left ventricular diastolic parameters  are indeterminate.  2. Right ventricular systolic function is normal. The right ventricular  size is normal. There is normal pulmonary artery systolic pressure.  3. The mitral valve is normal in structure. No evidence of mitral valve  regurgitation. No evidence of mitral stenosis.  4. The aortic valve is normal in structure. There is mild calcification  of the aortic valve. There is moderate thickening of the aortic valve.  Aortic valve regurgitation is not visualized. Mild to moderate aortic  valve  sclerosis/calcification is  present, without any evidence of aortic stenosis.  5. The inferior vena cava is normal in size with greater than 50%  respiratory variability, suggesting right atrial pressure of 3 mmHg.   Patient Profile     70 y.o. male here with intracranial hemorrhage, atrial flutter.  CAD post CABG, recent fall  Assessment &  Plan    Atrial fibrillation/flutter -Currently in flutter.  Heart rate reasonably controlled.  On Cardizem and metoprolol.  Digoxin has been discontinued.  If heart rate becomes an issue, can always increase either the Cardizem or metoprolol. -No anticoagulation secondary to recent intracranial hemorrhage.  Essential hypertension -Can always increase Cardizem or metoprolol if necessary.  Intracranial hemorrhage -Neurosurgery following.  Coronary artery disease post CABG -Resume statin at discharge.  No aspirin.      For questions or updates, please contact [place] Please consult [email or web address] for contact info under        Signed, Candee Furbish, MD  05/20/2020, 11:23 AM

## 2020-05-20 NOTE — Progress Notes (Signed)
   Providing Compassionate, Quality Care - Together   Subjective: Nurse reports patient's drain was just pulled out. Patient with sonorous respirations and tachypnea.  Objective: Vital signs in last 24 hours: Temp:  [97.8 F (36.6 C)-102.1 F (38.9 C)] 101.5 F (38.6 C) (03/05 0800) Pulse Rate:  [47-101] 101 (03/05 0800) Resp:  [14-56] 56 (03/05 0800) BP: (134-187)/(59-136) 145/82 (03/05 0800) SpO2:  [98 %-100 %] 100 % (03/05 0800)  Intake/Output from previous day: 03/04 0701 - 03/05 0700 In: 1481.3 [I.V.:68.6; NG/GT:1265; IV Piggyback:147.7] Out: 2755 [Urine:2700; Drains:55] Intake/Output this shift: Total I/O In: -  Out: 13 [Drains:13]  Somnolent, responds to voice PERRLA Tachypnea, presently maintaining 100 % SpO2 on RA; unable to speak during assessment MAE, Follows commands EVD site draining serous fluid   Lab Results: Recent Labs    05/18/20 0920 05/20/20 0318 05/20/20 0803  WBC 8.4 9.2  --   HGB 14.7 16.7 15.3  HCT 46.1 53.4* 45.0  PLT 166 196  --    BMET Recent Labs    05/18/20 0920 05/20/20 0318 05/20/20 0803  NA 144 142 143  K 3.4* 4.7 3.8  CL 108 104  --   CO2 26 27  --   GLUCOSE 184* 151*  --   BUN 16 22  --   CREATININE 0.72 0.83  --   CALCIUM 8.2* 8.7*  --     Studies/Results: CT HEAD WO CONTRAST  Result Date: 05/19/2020 CLINICAL DATA:  A history of recent stroke with intraventricular hemorrhage and left thalamic hemorrhage EXAM: CT HEAD WITHOUT CONTRAST TECHNIQUE: Contiguous axial images were obtained from the base of the skull through the vertex without intravenous contrast. COMPARISON:  Multiple previous exams the most recent of which is dated 05/18/2020 FINDINGS: Brain: There again noted changes of intraventricular hemorrhage particularly within the occipital horns of the lateral ventricles bilaterally. Ventriculostomy catheter is again noted on the right and stable. The degree of third ventricular hemorrhage and left thalamic  parenchymal hemorrhage has resolved similar to that seen on the prior day. No new hemorrhage is seen. Persistent ventricular dilatation is noted. Mild atrophic changes and chronic white matter ischemic changes are seen. No acute infarct is noted. Vascular: No hyperdense vessel or unexpected calcification. Skull: Normal. Negative for fracture or focal lesion. Sinuses/Orbits: Persistent opacification of the right sphenoid sinus. Other: None IMPRESSION: Persistent intraventricular hemorrhage within the lateral ventricles posteriorly. Ventriculostomy catheter is again identified on the right and stable. No new focal hemorrhage is seen. Chronic atrophic and ischemic changes. Electronically Signed   By: Alcide Clever M.D.   On: 05/19/2020 05:45    Assessment/Plan: Patient presented to the ED on 05/11/2020 due to an ICH with intraventricular extension. He was hypertensive and febrile. He was on Eliquis for atrial fibrillation. Theodoro Parma was given. EVD placed by Dr. Venetia Maxon on 05/12/2020. He was intubated in the ED and extubated on 4N on 05/16/2020. The plan was to clamp the EVD today and obtain follow up imaging, but EVD was accidentally pulled this morning (05/20/2020).   LOS: 9 days    -CCM notified of patient's worsening respiratory status. They will reassess. -Two staples were placed at EVD site. This appears to have stopped the drainage. -Follow up CT scan tomorrow AM.   Val Eagle, DNP, AGNP-C Nurse Practitioner  [facility] [address], [place], [place] [address] P: [phone]    F: [phone]  05/20/2020, 10:30 AM

## 2020-05-20 NOTE — Procedures (Signed)
Intubation Procedure Note  Jason [name]  [id number]  1950/11/03  Date:05/20/20  Time:11:26 AM   Provider Performing:Maguire Killmer P [facility]    Procedure: Intubation (31500)  Indication(s) Respiratory Failure  Consent Risks of the procedure as well as the alternatives and risks of each were explained to the patient and/or caregiver.  Consent for the procedure was obtained and is signed in the bedside chart   Anesthesia Etomidate and Rocuronium   Time Out Verified patient identification, verified procedure, site/side was marked, verified correct patient position, special equipment/implants available, medications/allergies/relevant history reviewed, required imaging and test results available.   Sterile Technique Usual hand hygeine, masks, and gloves were used   Procedure Description Patient positioned in bed supine.  Sedation given as noted above.  Patient was intubated with endotracheal tube using Glidescope.  View was Grade 1 full glottis .  Number of attempts was 1.  Colorimetric CO2 detector was consistent with tracheal placement.   Complications/Tolerance None; patient tolerated the procedure well. Chest X-ray is ordered to verify placement.   EBL Minimal   Specimen(s) None  Steffanie Dunn, DO 05/20/20 11:26 AM [facility]  From 7AM- 7PM if no response to pager, please call [phone]. After hours, 7PM- 7AM, please call [place]  [phone].

## 2020-05-21 ENCOUNTER — Inpatient Hospital Stay (HOSPITAL_COMMUNITY): Payer: Medicare HMO

## 2020-05-21 DIAGNOSIS — R739 Hyperglycemia, unspecified: Secondary | ICD-10-CM

## 2020-05-21 DIAGNOSIS — Z9911 Dependence on respirator [ventilator] status: Secondary | ICD-10-CM | POA: Diagnosis not present

## 2020-05-21 DIAGNOSIS — J9601 Acute respiratory failure with hypoxia: Secondary | ICD-10-CM | POA: Diagnosis not present

## 2020-05-21 DIAGNOSIS — J9600 Acute respiratory failure, unspecified whether with hypoxia or hypercapnia: Secondary | ICD-10-CM

## 2020-05-21 DIAGNOSIS — T17908A Unspecified foreign body in respiratory tract, part unspecified causing other injury, initial encounter: Secondary | ICD-10-CM | POA: Diagnosis not present

## 2020-05-21 DIAGNOSIS — I629 Nontraumatic intracranial hemorrhage, unspecified: Secondary | ICD-10-CM | POA: Diagnosis not present

## 2020-05-21 DIAGNOSIS — I483 Typical atrial flutter: Secondary | ICD-10-CM | POA: Diagnosis not present

## 2020-05-21 DIAGNOSIS — R4182 Altered mental status, unspecified: Secondary | ICD-10-CM | POA: Diagnosis not present

## 2020-05-21 DIAGNOSIS — I161 Hypertensive emergency: Secondary | ICD-10-CM | POA: Diagnosis not present

## 2020-05-21 LAB — TRIGLYCERIDES: Triglycerides: 92 mg/dL (ref <150)

## 2020-05-21 LAB — GLUCOSE, CAPILLARY
Glucose-Capillary: 104 mg/dL — ABNORMAL HIGH (ref 70–99)
Glucose-Capillary: 123 mg/dL — ABNORMAL HIGH (ref 70–99)
Glucose-Capillary: 144 mg/dL — ABNORMAL HIGH (ref 70–99)
Glucose-Capillary: 198 mg/dL — ABNORMAL HIGH (ref 70–99)
Glucose-Capillary: 204 mg/dL — ABNORMAL HIGH (ref 70–99)
Glucose-Capillary: 72 mg/dL (ref 70–99)

## 2020-05-21 LAB — CULTURE, RESPIRATORY W GRAM STAIN
Culture: NORMAL
Special Requests: NORMAL

## 2020-05-21 MED ORDER — IOHEXOL 350 MG/ML SOLN
75.0000 mL | Freq: Once | INTRAVENOUS | Status: AC | PRN
  Administered 2020-05-21: 75 mL via INTRAVENOUS

## 2020-05-21 MED ORDER — SODIUM CHLORIDE 0.9 % IV SOLN
750.0000 mg | Freq: Two times a day (BID) | INTRAVENOUS | Status: DC
  Administered 2020-05-22 – 2020-05-24 (×5): 750 mg via INTRAVENOUS
  Filled 2020-05-21 (×6): qty 7.5

## 2020-05-21 MED ORDER — LEVETIRACETAM IN NACL 1500 MG/100ML IV SOLN
1500.0000 mg | Freq: Once | INTRAVENOUS | Status: AC
  Administered 2020-05-21: 1500 mg via INTRAVENOUS
  Filled 2020-05-21: qty 100

## 2020-05-21 MED ORDER — LORAZEPAM 2 MG/ML IJ SOLN
2.0000 mg | INTRAMUSCULAR | Status: AC
  Administered 2020-05-21: 2 mg via INTRAVENOUS
  Filled 2020-05-21: qty 1

## 2020-05-21 MED ORDER — DEXMEDETOMIDINE HCL IN NACL 400 MCG/100ML IV SOLN
0.0000 ug/kg/h | INTRAVENOUS | Status: DC
  Administered 2020-05-21: 0.4 ug/kg/h via INTRAVENOUS
  Administered 2020-05-21: 0.8 ug/kg/h via INTRAVENOUS
  Filled 2020-05-21 (×2): qty 100

## 2020-05-21 NOTE — Progress Notes (Signed)
STAT EEG completed; results pending. Dr Yadav notified. 

## 2020-05-21 NOTE — Progress Notes (Signed)
STROKE TEAM PROGRESS NOTE   SUBJECTIVE (INTERVAL HISTORY) No family at the bedside. Pt was intubated yesterday for tachypnea and respiratory failure. Still febrile 101.6 today, on zosyn and vanco. EVD accidentally dislodged yesterday and CT this am stable, NSG signed off. NSG has stitched the EVD site.    Vitals:   05/19/20 0300 05/19/20 0400 05/19/20 0500 05/19/20 0600  BP: (!) 153/70 (!) 153/73 (!) 157/71 (!) 156/82  Pulse: 96 97 96 93  Resp: 20 (!) 48 (!) 43 (!) 32  Temp:  (!) 100.6 F (38.1 C)    TempSrc:  Oral    SpO2: 100% 100% 100% 99%  Weight:      Height:       CBC:  Recent Labs  Lab 05/16/20 0331 05/17/20 0241 05/18/20 0920  WBC 8.9 11.2* 8.4  NEUTROABS 6.6  --  6.1  HGB 13.4 15.0 14.7  HCT 41.6 47.8 46.1  MCV 88.1 89.5 87.6  PLT 119* 129* 166   Basic Metabolic Panel:  Recent Labs  Lab 05/14/20 0526 05/15/20 0251 05/17/20 0241 05/18/20 0920  NA 140   < > 142 144  K 4.5   < > 4.6 3.4*  CL 106   < > 105 108  CO2 25   < > 26 26  GLUCOSE 322*   < > 229* 184*  BUN 12   < > 18 16  CREATININE 0.66   < > 0.65 0.72  CALCIUM 8.3*   < > 8.5* 8.2*  MG 2.1  --  2.2  --   PHOS 3.9  --  3.1  --    < > = values in this interval not displayed.   Lipid Panel:  No results for input(s): CHOL, TRIG, HDL, CHOLHDL, VLDL, LDLCALC in the last 168 hours. HgbA1c:  No results for input(s): HGBA1C in the last 168 hours. Urine Drug Screen:  No results for input(s): LABOPIA, COCAINSCRNUR, LABBENZ, AMPHETMU, THCU, LABBARB in the last 168 hours.  Alcohol Level No results for input(s): ETH in the last 168 hours.  IMAGING past 24 hours EEG adult  Result Date: 05/14/2020  IMPRESSION: This study is suggestive of moderate to evere diffuse encephalopathy, nonspecific etiology. No seizures or epileptiform discharges were seen throughout the recording.  PHYSICAL EXAM   Temp:  [100 F (37.8 C)-102.3 F (39.1 C)] 100 F (37.8 C) (03/06 1200) Pulse Rate:  [48-103] 73 (03/06  1200) Resp:  [0-28] 18 (03/06 1200) BP: (94-156)/(54-77) 94/58 (03/06 1200) SpO2:  [100 %] 100 % (03/06 1200) FiO2 (%):  [30 %-40 %] 30 % (03/06 1200)  General - Well nourished, well developed elderly male, intubated on propofol  Ophthalmologic - fundi not visualized due to noncooperation.  Cardiovascular - A. flutter with regular ventricular rate on tele.  Neuro - intubated on sedation, eyes half way open, not consistently following commands. With eye opening, eyes in mid position, with attempted tracking, bilateral horizontal gaze incomplete with gaze induced nystagmus, blinking to visual threat on the right more consistent but on the left inconsistent, doll's eyes present, PERRL. Corneal reflex present, gag and cough present. Breathing over the vent.  Facial symmetry not able to test due to ET tube.  Tongue protrusion not cooperative. On pain stimulation, mild withdraw in all extremities, seems equal. Sensation, coordination and gait not tested.   ASSESSMENT/PLAN [name] is a 70 y.o. male with history of HTN, DM, CAD/MI, afib on eliquis presenting with AMS, garbled speech, N/V and behavior change.  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 Including filling both the third ventricle with layering blood in the fourth and lateral ventricle  2D Echo EF 45-50%, No shunt  LDL 84  HgbA1c 9.4  VTE prophylaxis - SCDs, Lovenox 40mg  daily  Eliquis (apixaban) daily prior to admission, now on No antithrombotic  Therapy recommendations:  SNF  Disposition:  Pending   IVH  S/p EVD  2/27 Neurosurgery instilled tPA into  EVD  3/1 EVD weaned to 20 cm H20, ICPs stable  3/2 failed clamping trial  3/5 EVD accidentally dislodged  CT head repeat 3/6 stable  NSG signed off  Hypertensive emergency  Home meds:  Lotensin, metoprolol  Stable on the low end  On Benazepril 40mg  daily, metoprolol 50mg  bid, cardizem 60 Q6 . Long-term BP goal normotensive  Afib/Aflutter on [facility] with HF   On eliquis PTA  Reversed by Kcentra  No antithrombotics due to ICH  Continue po cardizem and metoprolol for rate control  Cardiology consult appreciated: Rate control adequate.   Continue tele monitoring   EF 45%   Respiratory failure and LLL Pneumonia Fever   Extubated 3/1, vomited post extubation  Tachypnea re-intubated 3/5  CCM on board and appreciated  Empiric antibiotics Zosyn and vanco   WBC 8.9 -> 11.2->8.4->9.2  Tmax 102.8->101.6  Blood cultures 3/4 pending  Sputum culture pending  Dysphagia  Likely due to hemorrhagic stroke  Failed swallow eval  On TF @ 55  Speech on board  Hyperlipidemia  Home meds:  crestor 20  LDL 84, goal < 70  May continue statin at discharge  Diabetes type II Uncontrolled  Home meds:  insulin  HgbA1c 9.4, goal < 7.0  CBGs  SSI  On Lantus to 20->25u BID  Appreciate Diabetes Coordinator recommendations  Other Stroke Risk Factors  Advanced Age >/= 66   Hx stroke/TIA  Coronary artery disease and MI   Hospital day #10  This patient is critically ill due to ICH with IVH, respiratory failure, hydrocephalus s/p EVD, afib, fever, pneumonia and at significant risk of neurological worsening, death form hematoma expansion, obstructive hydrocephalus, heart failure, seizure, sepsis. This patient's care requires constant monitoring of vital signs, hemodynamics, respiratory and cardiac monitoring, review of multiple databases, neurological assessment, discussion with family, other specialists and medical decision making of high complexity. I spent 35  minutes of neurocritical care time in the care of this patient.  , MD PhD Stroke Neurology 05/21/2020 1:46 PM  To contact Stroke Continuity provider, please refer to 76. After hours, contact General Neurology

## 2020-05-21 NOTE — Progress Notes (Signed)
No new issues or problems.  Patient had a follow-up head CT scan today which demonstrates stable if not improved appearance of his lateral ventricles.  I see no evidence of progressive hydrocephalus and do not feel that any replacement of a ventricular drain is necessary or appropriate.  At this point I have no further recommendations with regard to neurosurgical care.  Please call if there are any new problems.

## 2020-05-21 NOTE — Progress Notes (Signed)
NAME:  Jason [name], MRN:  [id number], DOB:  01/21/1951, LOS: 10 ADMISSION DATE:  05/11/2020, CONSULTATION DATE:  2/25 REFERRING MD:  Dr. Iver Nestle Neurology, CHIEF COMPLAINT:  AMS  Brief History:  70 year old male admitted with hemorrhagic stroke ultimately requiring intubation for airway protection and EVD placed by neurosurgery.   History of Present Illness:  Patient is encephalopathic and/or intubated. Therefore history has been obtained from chart review.   70 year old male with PMH as below, which is significant for CAD, DM, HTN, PAF on AC, and CVA. He was last known well 2/23, and 2/24 in the morning he was found to be confused "talking out of his head". EMS was apparently called, but he was not initially transported to the emergency department for some reason. His mental status worsened and EMS was called again, this time transporting the patient to [facility] ED. In the ED he was noted to be febrile prompting an infectious workup. CT of the head showed ICH with intraventricular extension with concern for hydrocephalus. SBP on presentation 205. Theodoro Parma was given for Eliquis reversal. He was admitted by neurology to ICU where unfortunately his mental status continued to decline. PCCM was consulted for intubation at the same time neurology was consulted for ventriculostomy placement.   Past Medical History:    has a past medical history of Anxiety, Coronary artery disease, Depression, Diabetes mellitus without complication (HCC), Dysrhythmia, Hypertension, Myocardial infarction [facility]), and Stroke (HCC).   Significant Hospital Events:  2/24 admitted for [facility] 2/25 Intubated, EVD placed.  3/1 extubated 3/2 EVD clamped, however worsening mental status so reopened  Consults:  Neurosurgery  Procedures:  ETT 2/25 >3/1 EVD 2/25 >  Significant Diagnostic Tests:   CT head 2/24 > 1.2cm thalamic hemorrhage with intraventricular extension. Chronic right corona radiata lacunar insult.   CT head  2/25 > Medial left thalamic intraparenchymal hemorrhage appears the same, approximately 1 cm in size. Extension into the ventricular system with the amount of intraventricular blood being very similar. Ventriculomegaly appears the same without further dilatation.  Echocardiogram 05/12/20 estimation, is 45 to 50%.The left ventricle has no  regional wall motion abnormalities.  Micro Data:  Blood 2/24 > no growth final Urine 2/24 > no  growth final resp cx 3/2> normal flora Blood 3/4>> Sputum 3/4> few GPC, rare GVR>  Antimicrobials:  Cefazolin 2/24> 3/1 Rocephin 3/1-3/2 Zosyn 3/3> vanc 3/5>  Interim History / Subjective:  Tmax 102.3.   Objective   Blood pressure 117/66, pulse (!) 103, temperature (!) 101.6 F (38.7 C), temperature source Axillary, resp. rate 18, height 5\' 10"  (1.778 m), weight 96.5 kg, SpO2 100 %.    Vent Mode: PRVC FiO2 (%):  [30 %-100 %] 30 % Set Rate:  [18 bmp-24 bmp] 18 bmp Vt Set:  [580 mL] 580 mL PEEP:  [8 cmH20] 8 cmH20 Plateau Pressure:  [18 cmH20-20 cmH20] 19 cmH20   Intake/Output Summary (Last 24 hours) at 05/21/2020 0747 Last data filed at 05/21/2020 0600 Gross per 24 hour  Intake 1002.73 ml  Output 463 ml  Net 539.73 ml   Filed Weights   05/16/20 0402 05/17/20 0454 05/18/20 0437  Weight: 100 kg 96.4 kg 96.5 kg   Physical exam General: critically ill appearing man laying in bed in NAD HEENT: Staples in place, ETT in place, oral mucosa moist. Neuro: Awake, tracks with eyes, but no following commands with extremities. Intact cough. CV: S1S2, irreg rhythm, Aflutter on tele. PULM: rhonchi cleared with suctioning. Clear ETT  secretions, minimal  GI: soft, NT, ND Extremities: warm, dry, no edema. Cool hands  Skin: pallor, no rashes  Resolved Hospital Problem list   Hypokalemia   Assessment & Plan:   Intraparenchymal hemorrhage  -Likely hypertensive in nature. Anticoagulated on Eliquis, KCentra given in ED. -Left thalamic with intraventricular  extension. Likely hypertensive in nature. -EVD placed 2/25 P: -Appreciate management per neurology/ neurosurgery -EVD removed 3/5 -Neuro protective measures- avoid fevers, HOB >30 degrees, normoglycemia -con't enteral nutrition  Hypertensive emergency -BP on arrival was 205/120 P: -Continue enteral metoprolol, diltiazem, and benazepril -PRN IV antihypertensives  -goal SBP <160  Acute respiratory failure with hypoxia requiring MV Possibly aspiration pneumonia at admission -Extubated 3/1 with a few episodes of vomiting post extubation -T-max 101.1 overnight with left lower lobe infiltrate  P: -follow respiratory culture. Hopefully can deescalate vanc soon. -LTVV 4-8cc/kg IBW with goal Pplat <30 and DP<15 -wean FiO2 as able to maintain SpO2 >90% -Daily SAT & SBT-- Limited by severe tachypnea with shallow breathing, even in PRVC; question if he has a neuro-induced breathing pattern limiting him  Sphenoid sinusitis on CT scan -con't pip-tazo & vanc, needs 10 day course  Atrial fibrillation alternating with A- flutter on Eliquis -Converted into atrial flutter 2/25, rate 60-80 CAD Chronic HFrEF, EF 45% - appreciate cardiology consultation. Will continue with rate control. P: -Cardiology following, appreciate their assistance - stopped digoxin. Con't diltiazem and metoprolol. -Not a candidate for anticoagulation currently given bleed. Cardiology recommending against aspirin. Resume statin at discharge. -Continuous telemetry  -maintain euvolemia  DM2 with controlled hyperglycemia  -Hgb A1C 9.4 P: -con't lantus 25 units BID -con't TF coverage to 5 units Q4h -SSI PRN -goal BG 140-180  QTc prolongation P: -Avoid QTc prolonging meds  Best practice (evaluated daily)  Diet: NPO, Tube feeds at goal Pain/Anxiety/Delirium protocol (if indicated): n/a VAP protocol (if indicated): n/a DVT prophylaxis: lovenox GI prophylaxis: n/a Glucose control: SSI Mobility: PT OT,  SLP Disposition: ICU- high risk for reintubation  Goals of Care:  Last date of multidisciplinary goals of care discussion: per primary Family and staff present:  Summary of discussion:  Follow up goals of care discussion due:  Code Status: FULL  Labs   CBC: Recent Labs  Lab 05/15/20 0251 05/16/20 0331 05/17/20 0241 05/18/20 0920 05/20/20 0318 05/20/20 0803 05/20/20 1547  WBC 8.5 8.9 11.2* 8.4 9.2  --   --   NEUTROABS 6.1 6.6  --  6.1  --   --   --   HGB 13.3 13.4 15.0 14.7 16.7 15.3 14.3  HCT 42.5 41.6 47.8 46.1 53.4* 45.0 42.0  MCV 89.9 88.1 89.5 87.6 88.4  --   --   PLT 109* 119* 129* 166 196  --   --     Basic Metabolic Panel: Recent Labs  Lab 05/15/20 0251 05/16/20 0331 05/17/20 0241 05/18/20 0920 05/20/20 0318 05/20/20 0803 05/20/20 1547  NA 140 138 142 144 142 143 140  K 4.4 4.5 4.6 3.4* 4.7 3.8 3.6  CL 108 102 105 108 104  --   --   CO2 25 27 26 26 27   --   --   GLUCOSE 169* 326* 229* 184* 151*  --   --   BUN 15 32* 18 16 22   --   --   CREATININE 0.88 1.25* 0.65 0.72 0.83  --   --   CALCIUM 8.2* 8.2* 8.5* 8.2* 8.7*  --   --   MG  --   --  2.2  --   --   --   --   PHOS  --   --  3.1  --   --   --   --    GFR: Estimated Creatinine Clearance: 95.1 mL/min (by C-G formula based on SCr of 0.83 mg/dL). Recent Labs  Lab 05/16/20 0331 05/17/20 0241 05/18/20 0920 05/20/20 0318  WBC 8.9 11.2* 8.4 9.2    Liver Function Tests: Recent Labs  Lab 05/15/20 0251 05/16/20 0331 05/18/20 0920  AST 10* 14* 40  ALT 10 9 16   ALKPHOS 79 70 70  BILITOT 0.5 0.6 0.6  PROT 5.2* 5.5* 5.9*  ALBUMIN 2.3* 2.3* 2.2*   No results for input(s): LIPASE, AMYLASE in the last 168 hours. No results for input(s): AMMONIA in the last 168 hours.  ABG    Component Value Date/Time   PHART 7.663 (HH) 05/20/2020 1547   PCO2ART 30.8 (L) 05/20/2020 1547   PO2ART 370 (H) 05/20/2020 1547   HCO3 34.5 (H) 05/20/2020 1547   TCO2 35 (H) 05/20/2020 1547   ACIDBASEDEF 15.0 (H)  02/26/2020 1450   O2SAT 100.0 05/20/2020 1547     Coagulation Profile: No results for input(s): INR, PROTIME in the last 168 hours.  Cardiac Enzymes: No results for input(s): CKTOTAL, CKMB, CKMBINDEX, TROPONINI in the last 168 hours.  HbA1C: Hgb A1c MFr Bld  Date/Time Value Ref Range Status  05/11/2020 11:40 PM 9.4 (H) 4.8 - 5.6 % Final    Comment:    (NOTE) Pre diabetes:          5.7%-6.4%  Diabetes:              >6.4%  Glycemic control for   <7.0% adults with diabetes   02/26/2020 07:43 PM 12.8 (H) 4.8 - 5.6 % Final    Comment:    (NOTE) Pre diabetes:          5.7%-6.4%  Diabetes:              >6.4%  Glycemic control for   <7.0% adults with diabetes     CBG: Recent Labs  Lab 05/20/20 1207 05/20/20 1557 05/20/20 2043 05/20/20 2344 05/21/20 0355  GLUCAP 107* 151* 163* 115* 198*    This patient is critically ill with multiple organ system failure which requires frequent high complexity decision making, assessment, support, evaluation, and titration of therapies. This was completed through the application of advanced monitoring technologies and extensive interpretation of multiple databases. During this encounter critical care time was devoted to patient care services described in this note for 45 minutes.  07/21/20, DO 05/21/20 12:33 PM [facility]  From 7AM- 7PM if no response to pager, please call [phone]. After hours, 7PM- 7AM, please call Elink  [phone].

## 2020-05-21 NOTE — Progress Notes (Signed)
History of right thalamic hemorrhage with intraventricular extension now with right gaze deviation  Eyes open with right gaze deviation with nonfatiguing nystagmus. Eyes to midline on right head turn without change nystagmus characteristics. Pupils ~23mm equal, round and reactive. Not following commands No voluntary movements.  STAT lorazepam 2mg  IV - On follow up examination, gaze deviation resolved eyes closed and opens to stimulus and moving spontaneously. STAT CTA head and neck to rule out large vessel occlusion. Load levetiracetam 1,500mg  IV. Will need EEG.

## 2020-05-22 DIAGNOSIS — J9601 Acute respiratory failure with hypoxia: Secondary | ICD-10-CM | POA: Diagnosis not present

## 2020-05-22 DIAGNOSIS — I629 Nontraumatic intracranial hemorrhage, unspecified: Secondary | ICD-10-CM | POA: Diagnosis not present

## 2020-05-22 DIAGNOSIS — I615 Nontraumatic intracerebral hemorrhage, intraventricular: Secondary | ICD-10-CM | POA: Diagnosis not present

## 2020-05-22 DIAGNOSIS — T17908A Unspecified foreign body in respiratory tract, part unspecified causing other injury, initial encounter: Secondary | ICD-10-CM | POA: Diagnosis not present

## 2020-05-22 DIAGNOSIS — J9600 Acute respiratory failure, unspecified whether with hypoxia or hypercapnia: Secondary | ICD-10-CM | POA: Diagnosis not present

## 2020-05-22 LAB — CBC
HCT: 44.6 % (ref 39.0–52.0)
Hemoglobin: 13.9 g/dL (ref 13.0–17.0)
MCH: 27.9 pg (ref 26.0–34.0)
MCHC: 31.2 g/dL (ref 30.0–36.0)
MCV: 89.4 fL (ref 80.0–100.0)
Platelets: 194 10*3/uL (ref 150–400)
RBC: 4.99 MIL/uL (ref 4.22–5.81)
RDW: 14.8 % (ref 11.5–15.5)
WBC: 12.4 10*3/uL — ABNORMAL HIGH (ref 4.0–10.5)
nRBC: 0 % (ref 0.0–0.2)

## 2020-05-22 LAB — BASIC METABOLIC PANEL
Anion gap: 12 (ref 5–15)
BUN: 39 mg/dL — ABNORMAL HIGH (ref 8–23)
CO2: 23 mmol/L (ref 22–32)
Calcium: 7.9 mg/dL — ABNORMAL LOW (ref 8.9–10.3)
Chloride: 107 mmol/L (ref 98–111)
Creatinine, Ser: 1.1 mg/dL (ref 0.61–1.24)
GFR, Estimated: >60 mL/min (ref >60)
Glucose, Bld: 186 mg/dL — ABNORMAL HIGH (ref 70–99)
Potassium: 3.7 mmol/L (ref 3.5–5.1)
Sodium: 142 mmol/L (ref 135–145)

## 2020-05-22 LAB — MAGNESIUM: Magnesium: 2.4 mg/dL (ref 1.7–2.4)

## 2020-05-22 LAB — EXPECTORATED SPUTUM ASSESSMENT W GRAM STAIN, RFLX TO RESP C: Special Requests: NORMAL

## 2020-05-22 LAB — GLUCOSE, CAPILLARY
Glucose-Capillary: 115 mg/dL — ABNORMAL HIGH (ref 70–99)
Glucose-Capillary: 122 mg/dL — ABNORMAL HIGH (ref 70–99)
Glucose-Capillary: 125 mg/dL — ABNORMAL HIGH (ref 70–99)
Glucose-Capillary: 137 mg/dL — ABNORMAL HIGH (ref 70–99)
Glucose-Capillary: 180 mg/dL — ABNORMAL HIGH (ref 70–99)
Glucose-Capillary: 54 mg/dL — ABNORMAL LOW (ref 70–99)
Glucose-Capillary: 61 mg/dL — ABNORMAL LOW (ref 70–99)
Glucose-Capillary: 84 mg/dL (ref 70–99)

## 2020-05-22 LAB — VITAMIN B1: Vitamin B1 (Thiamine): 202.5 nmol/L — ABNORMAL HIGH (ref 66.5–200.0)

## 2020-05-22 MED ORDER — DEXTROSE 50 % IV SOLN: 12.5000 g | INTRAVENOUS | Status: AC

## 2020-05-22 MED ORDER — DILTIAZEM HCL 60 MG PO TABS
90.0000 mg | ORAL_TABLET | Freq: Four times a day (QID) | ORAL | Status: DC
  Administered 2020-05-22 – 2020-05-28 (×25): 90 mg
  Filled 2020-05-22 (×3): qty 1
  Filled 2020-05-22: qty 2
  Filled 2020-05-22: qty 1
  Filled 2020-05-22: qty 2
  Filled 2020-05-22: qty 1
  Filled 2020-05-22: qty 2
  Filled 2020-05-22 (×10): qty 1
  Filled 2020-05-22: qty 2
  Filled 2020-05-22 (×3): qty 1
  Filled 2020-05-22: qty 2
  Filled 2020-05-22 (×3): qty 1

## 2020-05-22 MED ORDER — DEXTROSE 50 % IV SOLN
INTRAVENOUS | Status: AC
  Administered 2020-05-22: 12.5 g via INTRAVENOUS
  Filled 2020-05-22: qty 50

## 2020-05-22 NOTE — Progress Notes (Signed)
Physical Therapy Treatment Patient Details Name: Jason [name] MRN: [id number] DOB: [birth year] Today's Date: 05/22/2020    History of Present Illness 70 yo male presenting with AMS. 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 Pt s/p R frontal IVC placement, ETT 2/25-3/1. 3/5 pt reintubated and EVD removed. Pt with recent admission after being found down at home with DKA. Other PMH includes: DM, HTN, CVA, CABG, afib with rvr, anxiety, and depression.    PT Comments    Pt re-intubated, FiO2 30%, 5 PEEP. Session focused on PROM to all four extremities and unsupported sitting with bed in chair position for truncal activation. Pt with right gaze deviation; able to reach midline with cueing. Will continue to progress as tolerated.    Follow Up Recommendations  SNF     Equipment Recommendations  Hospital bed    Recommendations for Other Services       Precautions / Restrictions Precautions Precautions: Fall;Other (comment) Precaution Comments: ETT Restrictions Weight Bearing Restrictions: No    Mobility  Bed Mobility Overal bed mobility: Needs Assistance Bed Mobility: Rolling Rolling: Max assist;+2 for physical assistance         General bed mobility comments: MaxA + 2 to roll towards left side    Transfers                    Ambulation/Gait                 Stairs             Wheelchair Mobility    Modified Rankin (Stroke Patients Only)       Balance Overall balance assessment: Needs assistance Sitting-balance support: Bilateral upper extremity supported;Feet supported Sitting balance-Leahy Scale: Poor Sitting balance - Comments: Requiring maxA in trunk unsupported position with bed in chair position                                    Cognition Arousal/Alertness: Lethargic Behavior During Therapy: Flat affect Overall Cognitive Status: Difficult to assess Area of Impairment: Following  commands                       Following Commands: Follows one step commands inconsistently       General Comments: Pt alert for first half of session, follows 10% of 1 step commands      Exercises General Exercises - Upper Extremity Shoulder Flexion: PROM;Both;10 reps;Supine General Exercises - Lower Extremity Ankle Circles/Pumps: PROM;Both;10 reps;Supine Heel Slides: PROM;Both;10 reps;Supine    General Comments        Pertinent Vitals/Pain Pain Assessment: Faces Faces Pain Scale: Hurts even more Pain Location: grimacing with R sided PROM Pain Descriptors / Indicators: Grimacing Pain Intervention(s): Limited activity within patient's tolerance;Monitored during session    Home Living                      Prior Function            PT Goals (current goals can now be found in the care plan section) Acute Rehab PT Goals PT Goal Formulation: Patient unable to participate in goal setting Time For Goal Achievement: 05/29/20 Potential to Achieve Goals: Fair    Frequency    Min 2X/week      PT Plan Frequency needs to be updated    Co-evaluation  AM-PAC PT "6 Clicks" Mobility   Outcome Measure  Help needed turning from your back to your side while in a flat bed without using bedrails?: Total Help needed moving from lying on your back to sitting on the side of a flat bed without using bedrails?: Total Help needed moving to and from a bed to a chair (including a wheelchair)?: Total Help needed standing up from a chair using your arms (e.g., wheelchair or bedside chair)?: Total Help needed to walk in hospital room?: Total Help needed climbing 3-5 steps with a railing? : Total 6 Click Score: 6    End of Session   Activity Tolerance: Patient limited by lethargy Patient left: in bed;with call bell/phone within reach;with bed alarm set;with restraints reapplied;with SCD's reapplied Nurse Communication: Mobility status PT Visit  Diagnosis: Muscle weakness (generalized) (M62.81);Difficulty in walking, not elsewhere classified (R26.2)     Time: 9024-0973 PT Time Calculation (min) (ACUTE ONLY): 29 min  Charges:  $Therapeutic Exercise: 8-22 mins $Therapeutic Activity: 8-22 mins                     Lillia Pauls, PT, DPT Acute Rehabilitation Services Pager [phone] Office [phone]    Norval Morton 05/22/2020, 12:42 PM

## 2020-05-22 NOTE — Progress Notes (Signed)
NAME:  Jason [name], MRN:  [id number], DOB:  [birth year], LOS: 11 ADMISSION DATE:  05/11/2020, CONSULTATION DATE:  2/25 REFERRING MD:  Dr. Iver Nestle (Neurology) CHIEF COMPLAINT:  AMS  Brief History:  70 year old male admitted with ICH (s/p KCentra in the setting of Eliquis) and concern for hydrocephalus; ultimately requiring intubation for airway protection. EVD placed by Neurosurgery 2/25 (out 3/5).  Past Medical History:  CAD, T2DM, HTN, PAF (on Eliquis), CVA  Significant Hospital Events:  2/24 Admitted for ICH, KCentra given in the setting of Eliquis 2/25 Intubated, EVD placed 3/1 Extubated 3/2 EVD clamped, however worsening mental status so reopened 3/5 Reintubated, EVD removed  Consults:  Neurosurgery, PCCM  Procedures:  ETT 2/25 >> 3/1, 3/5 >> EVD 2/25 >> 3/5  Significant Diagnostic Tests:   CT head 2/24 > 1.2cm thalamic hemorrhage with intraventricular extension. Chronic right corona radiata lacunar insult.   CT head 2/25 > Medial left thalamic intraparenchymal hemorrhage appears the same, approximately 1 cm in size. Extension into the ventricular system with the amount of intraventricular blood being very similar. Ventriculomegaly appears the same without further dilatation.  Echocardiogram 05/12/20 estimation, is 45 to 50%.The left ventricle has no  regional wall motion abnormalities.  Micro Data:  Blood 2/24 >> no growth final Urine 2/24 >> no  growth final Resp Cx 3/2 >> normal flora Blood Cx 3/4 >> Sputum 3/4 >> Few GPC, rare GVR >>  Antimicrobials:  Cefazolin 2/24 >> 3/1 Rocephin 3/1 >> 3/2 Zosyn 3/3 >> Vanc 3/5 >>  Interim History / Subjective:  Intubated, lightly sedated. Low grade fever (Tmax 100.2), rising WBC Remains on Zosyn/Vanc Weaning as tolerated on vent  Objective   Blood pressure (!) 110/57, pulse 95, temperature 99 F (37.2 C), temperature source Axillary, resp. rate 18, height 5\' 10"  (1.778 m), weight 96.5 kg, SpO2 100 %.    Vent Mode:  PRVC FiO2 (%):  [30 %] 30 % Set Rate:  [18 bmp] 18 bmp Vt Set:  [580 mL] 580 mL PEEP:  [5 cmH20] 5 cmH20 Plateau Pressure:  [14 cmH20-18 cmH20] 14 cmH20   Intake/Output Summary (Last 24 hours) at 05/22/2020 0832 Last data filed at 05/22/2020 0600 Gross per 24 hour  Intake 1985.53 ml  Output 1600 ml  Net 385.53 ml   Filed Weights   05/16/20 0402 05/17/20 0454 05/18/20 0437  Weight: 100 kg 96.4 kg 96.5 kg   Physical Exam: General: Critically ill-appearing man, laying in bed, in NAD. HEENT: Anicteric sclera, PERRL, rightward gaze. Moist mucous membranes. ETT in place. Prior EVD site with intact suture. Neuro: Sedated, will open eyes to voice/track slightly with eyes. Rightward gaze deviation as above, mild nystagmus. Intermittently following commands. Withdraws to painful stimuli. CV: Irregularly irregular rhythm, Aflutter on tele. PULM: Breathing even and unlabored on vent (PEEP 5, FiO2 30%), lung fields coarse bilaterally. GI: Soft, nontender, nondistended. Extremities: No LE edema noted. S/p R great toe amputation. Skin: Warm/dry, no rashes noted.  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 Likely hypertensive in nature. Anticoagulated on Eliquis, KCentra given in ED. S/p EVD placement 2/25 (removed 3/5). EEG 3/6 demonstrating generalized slowing c/f moderate to severe diffuse encephalopathy. - Appreciate Neuro/Neurosurgery management - Neuroprotective measures: HOB > 30 degrees, normoglycemia - S/p Keppra load, EEG 3/6 (as above)  Hypertensive emergency SBP on arrival to [facility] ED 205/120. - Goal SBP < 160 - Continue metoprolol, diltiazem, benazepril -  Hydralazine/Labetalol PRN  Acute respiratory failure with hypoxia requiring MV Possibly aspiration pneumonia at admission Initially extubated 3/1 with a few episodes of vomiting post extubation,  unfortunately reintubated 3/5PM. Remains intermittently febrile with left lower lobe infiltrate. - Continue full vent support (4-8cc/kg IBW) - Wean FiO2 for goal O2 sat > 90% - Daily WUA/SBT - F/u finalized respiratory culture  Sphenoid sinusitis on CT scan - Continue Zosyn/Vanc (10 day course, end 3/13)  Atrial fibrillation alternating with A- flutter on Eliquis CAD Chronic HFrEF, EF 45% - Cardiology following, appreciate assistance - Not a candidate for AC at present, given ICH - Resume statin at discharge - Continue telemetry - Maintain euvolemia - Keep K > 4, Mg > 2  DM2 with controlled hyperglycemia  Recent Hgb A1C 9.4. - Continue Lantus 25U BID - Continue TF coverage 5U Q4H - SSI PRN - Goal CBG 140-180  QTc prolongation - Avoid QTc prolonging meds as able  Best practice (evaluated daily)  Diet: NPO, TFs Pain/Anxiety/Delirium protocol (if indicated): Per protocl VAP protocol (if indicated): In place DVT prophylaxis: Lovenox GI prophylaxis: PPI Glucose control: SSI Mobility: Bedrest while intubated Disposition: ICU  Goals of Care:  Last date of multidisciplinary goals of care discussion: Per primary Family and staff present:  Summary of discussion:  Follow up goals of care discussion due: Code Status: FULL  Critical care time: 35 minutes    Tim Lair, PA-C [facility] 05/22/20 8:32 AM  Please see [email or web address] for pager details.

## 2020-05-22 NOTE — Progress Notes (Signed)
SLP Cancellation Note  Patient Details Name: Jason [name] MRN: [id number] DOB: 1951/03/14   Cancelled treatment:       Reason Eval/Treat Not Completed: Medical issues which prohibited therapy. Re-intubated. Will sign off at this time.    Aasiyah Auerbach, Riley Nearing 05/22/2020, 8:14 AM

## 2020-05-22 NOTE — Progress Notes (Signed)
Nutrition Follow-up  DOCUMENTATION CODES:   Not applicable  INTERVENTION:   Tube Feeding via Cortrak:  Continue Jevity 1.5 at 55 ml/h (1320 ml per day) Prosource TF 45 ml TID  Provides 2100 kcal, 117 gm protein, 1003 ml free water daily   NUTRITION DIAGNOSIS:   Inadequate oral intake related to inability to eat as evidenced by NPO status.  Being addressed via TF   GOAL:   Patient will meet greater than or equal to 90% of their needs  Progressing  MONITOR:   TF tolerance  REASON FOR ASSESSMENT:   Consult,Ventilator Enteral/tube feeding initiation and management  ASSESSMENT:   Pt with PMH of anxiety, depression, DM, HTN, stroke, and MI admitted with ICH likely due to HTN.  2/25 s/p EVD and intubation  3/01 extubated; vomited post extubation concern for aspiration PNA 3/02 failed CVD clamping; cortrak placed with tip gastric per xray  3/03 Cortrak placed 3/05 EVD removed, Re-intubated  Pt remains sedated on vent post re-intubation  Tolerating Jevity 1.5 at 55 ml/hr with Pro-source TF 45 ML TID via Cortrak  Current wt 96.4 kg; admit weight 94 kg. Net +7 L per I/o flow sheet  Rectal tube with liquid stool  Labs: reviewed Meds: ss novolog, novolog q 4 hours, lantus, miralax, thiamine  Diet Order:   Diet Order    None      EDUCATION NEEDS:   No education needs have been identified at this time  Skin:  Skin Assessment: Reviewed RN Assessment  Last BM:  3/7  Height:   Ht Readings from Last 1 Encounters:  05/12/20 5\' 10"  (1.778 m)    Weight:   Wt Readings from Last 1 Encounters:  05/18/20 96.5 kg    BMI:  Body mass index is 30.53 kg/m.  Estimated Nutritional Needs:   Kcal:  2100-2300  Protein:  115-130 grams  Fluid:  >2 L/day   07/18/20 MS, RDN, LDN, CNSC Registered Dietitian III Clinical Nutrition RD Pager and On-Call Pager Number Located in [place]

## 2020-05-22 NOTE — Progress Notes (Signed)
STROKE TEAM PROGRESS NOTE   SUBJECTIVE (INTERVAL HISTORY) No family at the bedside. Pt was intubated 2 days ago for tachypnea and respiratory failure.  T-max 100.6. Overnight, concern for seizure-like activity-with right gaze deviation, nonfatiguing nystagmus, not following any commands. Given stat lorazepam 2 mg IV-on repeat exam by the overnight neurologist, gaze deviation resolved, eyes closed and open to stimulation and was moving spontaneously. Stat CTA head and neck to rule out LVO was performed-stable head CT with no acute intracranial abnormality. Residual subacute IVH with stable ventriculomegaly and sequela of prior right frontal ventriculostomy and underlying atrophy and chronic microvascular ischemic disease. The angiography of head and neck did not show any large vessel occlusion. Showed extensive atheromatous plaque throughout the carotid siphons and associated moderate multifocal narrowing. See detailed report below. . EEG negative for seizures. Severe diffuse encephalopathy of nonspecific etiology.  Vitals:   05/19/20 0300 05/19/20 0400 05/19/20 0500 05/19/20 0600  BP: (!) 153/70 (!) 153/73 (!) 157/71 (!) 156/82  Pulse: 96 97 96 93  Resp: 20 (!) 48 (!) 43 (!) 32  Temp:  (!) 100.6 F (38.1 C)    TempSrc:  Oral    SpO2: 100% 100% 100% 99%  Weight:      Height:       CBC:  Recent Labs  Lab 05/16/20 0331 05/17/20 0241 05/18/20 0920  WBC 8.9 11.2* 8.4  NEUTROABS 6.6  --  6.1  HGB 13.4 15.0 14.7  HCT 41.6 47.8 46.1  MCV 88.1 89.5 87.6  PLT 119* 129* 654   Basic Metabolic Panel:  Recent Labs  Lab 05/14/20 0526 05/15/20 0251 05/17/20 0241 05/18/20 0920  NA 140   < > 142 144  K 4.5   < > 4.6 3.4*  CL 106   < > 105 108  CO2 25   < > 26 26  GLUCOSE 322*   < > 229* 184*  BUN 12   < > 18 16  CREATININE 0.66   < > 0.65 0.72  CALCIUM 8.3*   < > 8.5* 8.2*  MG 2.1  --  2.2  --   PHOS 3.9  --  3.1  --    < > = values in this interval not displayed.   Lipid Panel:   No results for input(s): CHOL, TRIG, HDL, CHOLHDL, VLDL, LDLCALC in the last 168 hours. HgbA1c:  No results for input(s): HGBA1C in the last 168 hours. Urine Drug Screen:  No results for input(s): LABOPIA, COCAINSCRNUR, LABBENZ, AMPHETMU, THCU, LABBARB in the last 168 hours.  Alcohol Level No results for input(s): ETH in the last 168 hours.  IMAGING past 24 hours EEG adult  Result Date: 05/14/2020  IMPRESSION: This study is suggestive of moderate to evere diffuse encephalopathy, nonspecific etiology. No seizures or epileptiform discharges were seen throughout the recording.  PHYSICAL EXAM Temp:  [98.7 F (37.1 C)-100.2 F (37.9 C)] 100.2 F (37.9 C) (03/07 0800) Pulse Rate:  [72-105] 102 (03/07 0726) Resp:  [9-25] 19 (03/07 0726) BP: (74-141)/(48-106) 110/57 (03/07 0726) SpO2:  [97 %-100 %] 100 % (03/07 0844) FiO2 (%):  [30 %] 30 % (03/07 0726) General - Well nourished, well developed elderly male, intubated on propofol Ophthalmologic - fundi not visualized due to noncooperation. Cardiovascular - A. flutter with regular ventricular rate on tele. Neuro - intubated on sedation, eyes half way open, opens to command.  Follows commands-was able to give thumbs up on both arms and was able to wiggle toes to command.  With eye opening, eyes in mid position, with attempted tracking, bilateral horizontal gaze incomplete with gaze induced nystagmus, blinking to visual threat on the right more consistent but on the left inconsistent, doll's eyes present, PERRL. Corneal reflex present, gag and cough present. Breathing over the vent.  Facial symmetry not able to test due to ET tube.  Tongue protrusion not cooperative.  Grimaces equally to noxious stimulation on both sides  MED LIST  Current Facility-Administered Medications:  .  0.9 %  sodium chloride infusion, 250 mL, Intravenous, Continuous, Clark, Laura P, DO .  acetaminophen (TYLENOL) tablet 650 mg, 650 mg, Oral, Q4H PRN **OR** acetaminophen  (TYLENOL) 160 MG/5ML solution 650 mg, 650 mg, Per Tube, Q4H PRN, 650 mg at 05/21/20 1215 **OR** acetaminophen (TYLENOL) suppository 650 mg, 650 mg, Rectal, Q4H PRN, Bhagat, Srishti L, MD, 650 mg at 05/17/20 1441 .  atorvastatin (LIPITOR) tablet 40 mg, 40 mg, Per NG tube, q1800, Olivencia-Simmons, Ivelisse, NP, 40 mg at 05/21/20 1730 .  chlorhexidine gluconate (MEDLINE KIT) (PERIDEX) 0.12 % solution 15 mL, 15 mL, Mouth Rinse, BID, Clark, Laura P, DO, 15 mL at 05/22/20 0800 .  Chlorhexidine Gluconate Cloth 2 % PADS 6 each, 6 each, Topical, Daily, Bhagat, Srishti L, MD, 6 each at 05/22/20 1000 .  dexmedetomidine (PRECEDEX) 400 MCG/100ML (4 mcg/mL) infusion, 0-1.2 mcg/kg/hr, Intravenous, Continuous, Steffanie Dunn, DO, Stopped at 05/21/20 2137 .  diltiazem (CARDIZEM) tablet 60 mg, 60 mg, Per Tube, Q6H, Legrand Pitts, RPH, 60 mg at 05/22/20 1100 .  docusate (COLACE) 50 MG/5ML liquid 100 mg, 100 mg, Per Tube, BID, Clark, Laura P, DO .  enoxaparin (LOVENOX) injection 40 mg, 40 mg, Subcutaneous, Daily, Micki Riley, MD, 40 mg at 05/22/20 1054 .  feeding supplement (JEVITY 1.5 CAL/FIBER) liquid 1,000 mL, 1,000 mL, Per Tube, Continuous, Micki Riley, MD, Last Rate: 55 mL/hr at 05/21/20 1545, 1,000 mL at 05/21/20 1545 .  feeding supplement (PROSource TF) liquid 45 mL, 45 mL, Per Tube, TID, Charlott Holler, MD, 45 mL at 05/22/20 1052 .  fentaNYL (SUBLIMAZE) injection 100 mcg, 100 mcg, Intravenous, Once, Steffanie Dunn, DO .  fentaNYL (SUBLIMAZE) injection 25 mcg, 25 mcg, Intravenous, Q15 min PRN, Karie Fetch P, DO .  fentaNYL (SUBLIMAZE) injection 25-100 mcg, 25-100 mcg, Intravenous, Q30 min PRN, Karie Fetch P, DO .  hydrALAZINE (APRESOLINE) injection 20 mg, 20 mg, Intravenous, Q6H PRN, Micki Riley, MD, 20 mg at 05/19/20 1220 .  insulin aspart (novoLOG) injection 0-20 Units, 0-20 Units, Subcutaneous, Q4H, Bevelyn Ngo, NP, 3 Units at 05/22/20 [phone] .  insulin aspart (novoLOG) injection 5 Units, 5  Units, Subcutaneous, Q4H, Steffanie Dunn, DO, 5 Units at 05/22/20 0830 .  insulin glargine (LANTUS) injection 25 Units, 25 Units, Subcutaneous, BID, Raymon Mutton F, NP, 25 Units at 05/22/20 1053 .  labetalol (NORMODYNE) injection 10 mg, 10 mg, Intravenous, Q2H PRN, Micki Riley, MD, 10 mg at 05/20/20 4707 .  levETIRAcetam (KEPPRA) 750 mg in sodium chloride 0.9 % 100 mL IVPB, 750 mg, Intravenous, Q12H, Rica Mote, MD, Last Rate: 430 mL/hr at 05/22/20 1051, 750 mg at 05/22/20 1051 .  MEDLINE mouth rinse, 15 mL, Mouth Rinse, 10 times per day, Karie Fetch P, DO, 15 mL at 05/22/20 1000 .  metoprolol tartrate (LOPRESSOR) tablet 100 mg, 100 mg, Per Tube, TID, Raymon Mutton F, NP, 100 mg at 05/22/20 1052 .  ondansetron (ZOFRAN) injection 4 mg, 4 mg, Intravenous, Q6H PRN, Lynnell Catalan, MD,  4 mg at 05/19/20 1339 .  pantoprazole sodium (PROTONIX) 40 mg/20 mL oral suspension 40 mg, 40 mg, Per Tube, Daily, Noemi Chapel P, DO, 40 mg at 05/22/20 1052 .  piperacillin-tazobactam (ZOSYN) IVPB 3.375 g, 3.375 g, Intravenous, Q8H, Garvin Fila, MD, Last Rate: 12.5 mL/hr at 05/22/20 0600, Infusion Verify at 05/22/20 0600 .  polyethylene glycol (MIRALAX / GLYCOLAX) packet 17 g, 17 g, Per Tube, Daily, Corey Harold, NP, 17 g at 05/20/20 0939 .  propofol (DIPRIVAN) 1000 MG/100ML infusion, 0-50 mcg/kg/min, Intravenous, Continuous, Julian Hy, DO, Stopped at 05/21/20 [phone] .  senna-docusate (Senokot-S) tablet 1 tablet, 1 tablet, Per Tube, BID, Rosalin Hawking, MD, 1 tablet at 05/20/20 [phone]  thiamine tablet 100 mg, 100 mg, Per Tube, Daily, Bailey-Modzik, Delila A, NP, 100 mg at 05/22/20 1052 .  vancomycin (VANCOREADY) IVPB 1750 mg/350 mL, 1,750 mg, Intravenous, Q24H, von Caren Griffins, RPH, Stopped at 05/21/20 1243  ASSESSMENT/PLAN [name] is a 70 y.o. male with history of HTN, DM, CAD/MI, afib on eliquis presenting with AMS, garbled speech, N/V and behavior change.   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 Including filling both the third ventricle with layering blood in the fourth and lateral ventricle  Repeat CT head 05/21/2020-done for concern for rightward gaze-with no new changes. CTA head and neck with no LVO.  2D Echo EF 45-50%, No shunt  LDL 84  HgbA1c 9.4  VTE prophylaxis - SCDs, Lovenox $RemoveBefor'40mg'fPIGWWTehHkk$  daily  Eliquis (apixaban) daily prior to admission, now on No antithrombotic  Therapy recommendations:  SNF  Disposition:  Pending   IVH  S/p EVD  2/27 Neurosurgery instilled tPA into EVD  3/1 EVD weaned to 20 cm H20, ICPs stable  3/2 failed clamping trial  3/5 EVD accidentally dislodged  CT head repeat 3/6 stable, repeat at night due to concern for gaze deviation also stable.  NSG signed off  Hypertensive emergency  Home meds:  Lotensin, metoprolol  Stable on the low end  On Benazepril $RemoveBefor'40mg'tpuiGOqcSnqm$  daily, metoprolol $RemoveBeforeDEI'50mg'YuFlaCkKbJcYZpim$  bid, cardizem 60 Q6 . 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   Continue tele monitoring   EF 45%   Concern for seizure overnight EEG with no evidence of seizures.  Severe diffuse encephalopathy of nonspecific etiology. Continue Keppra 750 twice daily    Respiratory failure and LLL Pneumonia Fever   Extubated 3/1, vomited post extubation  Tachypnea re-intubated 3/5  CCM on board and appreciated  Empiric antibiotics Zosyn and vanco   WBC 8.9 ->  11.2->8.4->9.2  Tmax 102.8->101.6  Blood cultures 3/4 pending  Sputum culture pending  Appreciate CCM assistance with management  Dysphagia  Likely due to hemorrhagic stroke  Failed swallow eval  On TF @ 18  Speech on board  Hyperlipidemia  Home meds:  crestor 20  LDL 84, goal < 70  May continue statin at discharge  Diabetes type II Uncontrolled  Home meds:  insulin  HgbA1c 9.4, goal < 7.0  CBGs  SSI  On Lantus to 20->25u BID  Appreciate Diabetes Coordinator recommendations  Other Stroke Risk Factors  Advanced Age >/= 41   Hx stroke/TIA  Coronary artery disease and MI   Hospital day #11  This patient is critically ill due to [place] with IVH, respiratory failure, hydrocephalus s/p EVD, afib, fever, pneumonia and at significant risk of neurological worsening, death form hematoma expansion, obstructive hydrocephalus, heart failure, seizure, sepsis. This patient's care requires constant monitoring of vital signs, hemodynamics, respiratory and cardiac monitoring, review of multiple databases, neurological assessment, discussion with family, other specialists and medical decision making of high complexity. I spent 45 minutes of neurocritical care time in the care of this patient.  -- Amie Portland, MD Neurologist [facility] Pager: [phone]   To contact Stroke Continuity provider, please refer to [email or web address] After hours, contact General Neurology

## 2020-05-22 NOTE — Procedures (Signed)
Patient Name: Jason [name]  MRN: [id number]  Epilepsy Attending: Charlsie Quest  Referring Physician/Provider: Dr Marisue Humble Date: 05/21/2020 Duration: 24.01 mins  Patient history: 70 year old male with right thalamic hemorrhage with intraventricular extension noted to have right gaze deviation.  EEG to evaluate for seizures.  Level of alertness: comatose  AEDs during EEG study: Keppra  Technical aspects: This EEG study was done with scalp electrodes positioned according to the 10-20 International system of electrode placement. Electrical activity was acquired at a sampling rate of 500Hz  and reviewed with a high frequency filter of 70Hz  and a low frequency filter of 1Hz . EEG data were recorded continuously and digitally stored.   Description: No clear posterior dominant rhythm was seen.  EEG showed continuous generalized polymorphic 3 to 6 Hz theta-delta slowing. Patient was noted to multiple episodes where he was biting down on the tube. Concomitant EEG before, during and after the event did not show any EEG changes suggest seizure.  Hyperventilation and photic stimulation were not performed.     ABNORMALITY -Continuous slow, generalized  IMPRESSION: This study is suggestive of moderate to severe diffuse encephalopathy, nonspecific etiology.  No seizures or epileptiform discharges were seen throughout the recording.   Marguriete Wootan 

## 2020-05-23 DIAGNOSIS — I483 Typical atrial flutter: Secondary | ICD-10-CM | POA: Diagnosis not present

## 2020-05-23 DIAGNOSIS — I615 Nontraumatic intracerebral hemorrhage, intraventricular: Secondary | ICD-10-CM | POA: Diagnosis not present

## 2020-05-23 DIAGNOSIS — I161 Hypertensive emergency: Secondary | ICD-10-CM | POA: Diagnosis not present

## 2020-05-23 DIAGNOSIS — I629 Nontraumatic intracranial hemorrhage, unspecified: Secondary | ICD-10-CM | POA: Diagnosis not present

## 2020-05-23 LAB — BASIC METABOLIC PANEL
Anion gap: 12 (ref 5–15)
BUN: 28 mg/dL — ABNORMAL HIGH (ref 8–23)
CO2: 26 mmol/L (ref 22–32)
Calcium: 8.2 mg/dL — ABNORMAL LOW (ref 8.9–10.3)
Chloride: 110 mmol/L (ref 98–111)
Creatinine, Ser: 0.88 mg/dL (ref 0.61–1.24)
GFR, Estimated: >60 mL/min (ref >60)
Glucose, Bld: 71 mg/dL (ref 70–99)
Potassium: 3.4 mmol/L — ABNORMAL LOW (ref 3.5–5.1)
Sodium: 148 mmol/L — ABNORMAL HIGH (ref 135–145)

## 2020-05-23 LAB — GLUCOSE, CAPILLARY
Glucose-Capillary: 51 mg/dL — ABNORMAL LOW (ref 70–99)
Glucose-Capillary: 88 mg/dL (ref 70–99)
Glucose-Capillary: 106 mg/dL — ABNORMAL HIGH (ref 70–99)
Glucose-Capillary: 169 mg/dL — ABNORMAL HIGH (ref 70–99)
Glucose-Capillary: 189 mg/dL — ABNORMAL HIGH (ref 70–99)
Glucose-Capillary: 173 mg/dL — ABNORMAL HIGH (ref 70–99)
Glucose-Capillary: 167 mg/dL — ABNORMAL HIGH (ref 70–99)

## 2020-05-23 LAB — CBC
HCT: 44.4 % (ref 39.0–52.0)
Hemoglobin: 13.7 g/dL (ref 13.0–17.0)
MCH: 27.9 pg (ref 26.0–34.0)
MCHC: 30.9 g/dL (ref 30.0–36.0)
MCV: 90.4 fL (ref 80.0–100.0)
Platelets: 188 10*3/uL (ref 150–400)
RBC: 4.91 MIL/uL (ref 4.22–5.81)
RDW: 14.7 % (ref 11.5–15.5)
WBC: 12 10*3/uL — ABNORMAL HIGH (ref 4.0–10.5)
nRBC: 0 % (ref 0.0–0.2)

## 2020-05-23 MED ORDER — MIDAZOLAM HCL 2 MG/2ML IJ SOLN
INTRAMUSCULAR | Status: AC
  Filled 2020-05-23: qty 4

## 2020-05-23 MED ORDER — INSULIN ASPART 100 UNIT/ML ~~LOC~~ SOLN
3.0000 [IU] | SUBCUTANEOUS | Status: DC
  Administered 2020-05-23 – 2020-05-28 (×30): 3 [IU] via SUBCUTANEOUS

## 2020-05-23 MED ORDER — ORAL CARE MOUTH RINSE
15.0000 mL | Freq: Two times a day (BID) | OROMUCOSAL | Status: DC
  Administered 2020-05-24 – 2020-06-10 (×36): 15 mL via OROMUCOSAL

## 2020-05-23 MED ORDER — DEXTROSE 50 % IV SOLN
12.5000 g | INTRAVENOUS | Status: AC
  Administered 2020-05-23: 12.5 g via INTRAVENOUS
  Filled 2020-05-23: qty 50

## 2020-05-23 MED ORDER — INSULIN GLARGINE 100 UNIT/ML ~~LOC~~ SOLN
20.0000 [IU] | Freq: Two times a day (BID) | SUBCUTANEOUS | Status: DC
  Administered 2020-05-23 – 2020-05-28 (×11): 20 [IU] via SUBCUTANEOUS
  Filled 2020-05-23 (×12): qty 0.2

## 2020-05-23 MED ORDER — ETOMIDATE 2 MG/ML IV SOLN
INTRAVENOUS | Status: AC
  Filled 2020-05-23: qty 20

## 2020-05-23 MED ORDER — ROCURONIUM BROMIDE 10 MG/ML (PF) SYRINGE
PREFILLED_SYRINGE | INTRAVENOUS | Status: AC
  Filled 2020-05-23: qty 10

## 2020-05-23 MED ORDER — FENTANYL CITRATE (PF) 100 MCG/2ML IJ SOLN
INTRAMUSCULAR | Status: AC
  Filled 2020-05-23: qty 2

## 2020-05-23 MED ORDER — POTASSIUM CHLORIDE 20 MEQ PO PACK
40.0000 meq | PACK | Freq: Once | ORAL | Status: AC
  Administered 2020-05-23: 40 meq
  Filled 2020-05-23: qty 2

## 2020-05-23 NOTE — Progress Notes (Signed)
05/23/20 3:36 PM   Attempted to call patient's son, Roark Rufo at [phone]. Left voicemail with brief update and callback number. Will re-attempt to update tomorrow, 3/9.  Tim Lair, PA-C [facility] 05/23/20 3:39 PM  Please see [email or web address] for pager details.

## 2020-05-23 NOTE — Progress Notes (Signed)
eLink Physician-Brief Progress Note Patient Name: Jason [name] DOB: 05/05/1950 MRN: [id number]   Date of Service  05/23/2020  HPI/Events of Note  K+ 3.4  eICU Interventions  KCL 40 meq via NG tube x 1.        Thomasene Lot Ogan 05/23/2020, 4:37 AM

## 2020-05-23 NOTE — Progress Notes (Signed)
STROKE TEAM PROGRESS NOTE   SUBJECTIVE (INTERVAL HISTORY) No family at the bedside. Pt was intubated 2 days ago for tachypnea and respiratory failure.  T-max 99.7 No more seizure-like activity  Vitals:   05/19/20 0300 05/19/20 0400 05/19/20 0500 05/19/20 0600  BP: (!) 153/70 (!) 153/73 (!) 157/71 (!) 156/82  Pulse: 96 97 96 93  Resp: 20 (!) 48 (!) 43 (!) 32  Temp:  (!) 100.6 F (38.1 C)    TempSrc:  Oral    SpO2: 100% 100% 100% 99%  Weight:      Height:       CBC    Component Value Date/Time   WBC 12.0 (H) 05/23/2020 0333   RBC 4.91 05/23/2020 0333   HGB 13.7 05/23/2020 0333   HCT 44.4 05/23/2020 0333   PLT 188 05/23/2020 0333   MCV 90.4 05/23/2020 0333   MCH 27.9 05/23/2020 0333   MCHC 30.9 05/23/2020 0333   RDW 14.7 05/23/2020 0333   LYMPHSABS 1.0 05/18/2020 0920   MONOABS 1.3 (H) 05/18/2020 0920   EOSABS 0.0 05/18/2020 0920   BASOSABS 0.1 05/18/2020 0920   BMP Latest Ref Rng & Units 05/23/2020 05/22/2020 05/20/2020  Glucose 70 - 99 mg/dL 71 186(H) -  BUN 8 - 23 mg/dL 28(H) 39(H) -  Creatinine 0.61 - 1.24 mg/dL 0.88 1.10 -  Sodium 135 - 145 mmol/L 148(H) 142 140  Potassium 3.5 - 5.1 mmol/L 3.4(L) 3.7 3.6  Chloride 98 - 111 mmol/L 110 107 -  CO2 22 - 32 mmol/L 26 23 -  Calcium 8.9 - 10.3 mg/dL 8.2(L) 7.9(L) -    IMAGING past 24 hours EEG adult  Result Date: 05/14/2020  IMPRESSION: This study is suggestive of moderate to evere diffuse encephalopathy, nonspecific etiology. No seizures or epileptiform discharges were seen throughout the recording.  PHYSICAL EXAM Temp:  [97.9 F (36.6 C)-99.7 F (37.6 C)] 98.4 F (36.9 C) (03/08 0800) Pulse Rate:  [49-118] 71 (03/08 0900) Resp:  [7-28] 25 (03/08 0900) BP: (122-151)/(65-78) 139/75 (03/08 0900) SpO2:  [99 %-100 %] 100 % (03/08 0900) FiO2 (%):  [30 %] 30 % (03/08 0732) Weight:  [96.1 kg] 96.1 kg (03/08 0443) General - Well nourished, well developed elderly male, intubated on propofol Ophthalmologic - fundi not  visualized due to noncooperation. Cardiovascular - A. flutter with regular ventricular rate on tele. Neuro - intubated on sedation, eyes half way open, opens to command.  Follows commands-was able to give thumbs up on both arms and was able to wiggle toes to command.  With eye opening, eyes in mid position, with attempted tracking, bilateral horizontal gaze incomplete with gaze induced nystagmus, blinking to visual threat on the right more consistent but on the left inconsistent, doll's eyes present, PERRL. Corneal reflex present, gag and cough present. Breathing over the vent.  Facial symmetry not able to test due to ET tube.  Grimaces equally to noxious stimulation on both sides  MED LIST  Current Facility-Administered Medications:  .  0.9 %  sodium chloride infusion, 250 mL, Intravenous, Continuous, Clark, Laura P, DO .  acetaminophen (TYLENOL) tablet 650 mg, 650 mg, Oral, Q4H PRN **OR** acetaminophen (TYLENOL) 160 MG/5ML solution 650 mg, 650 mg, Per Tube, Q4H PRN, 650 mg at 05/21/20 1215 **OR** acetaminophen (TYLENOL) suppository 650 mg, 650 mg, Rectal, Q4H PRN, Bhagat, Srishti L, MD, 650 mg at 05/17/20 1441 .  atorvastatin (LIPITOR) tablet 40 mg, 40 mg, Per NG tube, q1800, Olivencia-Simmons, Ivelisse, NP, 40 mg at 05/23/20 1751 .  chlorhexidine gluconate (MEDLINE KIT) (PERIDEX) 0.12 % solution 15 mL, 15 mL, Mouth Rinse, BID, Clark, Laura P, DO, 15 mL at 05/23/20 0800 .  Chlorhexidine Gluconate Cloth 2 % PADS 6 each, 6 each, Topical, Daily, Bhagat, Srishti L, MD, 6 each at 05/23/20 1633 .  diltiazem (CARDIZEM) tablet 90 mg, 90 mg, Per Tube, Q6H, Lestine Mount, PA-C, 90 mg at 05/23/20 1752 .  enoxaparin (LOVENOX) injection 40 mg, 40 mg, Subcutaneous, Daily, Garvin Fila, MD, 40 mg at 05/23/20 0820 .  feeding supplement (JEVITY 1.5 CAL/FIBER) liquid 1,000 mL, 1,000 mL, Per Tube, Continuous, Garvin Fila, MD, Last Rate: 55 mL/hr at 05/23/20 1324, 1,000 mL at 05/23/20 1324 .  feeding  supplement (PROSource TF) liquid 45 mL, 45 mL, Per Tube, TID, Spero Geralds, MD, 45 mL at 05/23/20 1632 .  fentaNYL (SUBLIMAZE) injection 100 mcg, 100 mcg, Intravenous, Once, Julian Hy, DO .  fentaNYL (SUBLIMAZE) injection 25 mcg, 25 mcg, Intravenous, Q15 min PRN, Noemi Chapel P, DO .  fentaNYL (SUBLIMAZE) injection 25-100 mcg, 25-100 mcg, Intravenous, Q30 min PRN, Julian Hy, DO, 100 mcg at 05/23/20 0216 .  hydrALAZINE (APRESOLINE) injection 20 mg, 20 mg, Intravenous, Q6H PRN, Garvin Fila, MD, 20 mg at 05/19/20 1220 .  insulin aspart (novoLOG) injection 0-20 Units, 0-20 Units, Subcutaneous, Q4H, Magdalen Spatz, NP, 4 Units at 05/23/20 1633 .  insulin aspart (novoLOG) injection 3 Units, 3 Units, Subcutaneous, Q4H, Lestine Mount, [place], 3 Units at 05/23/20 1633 .  insulin glargine (LANTUS) injection 20 Units, 20 Units, Subcutaneous, BID, Lestine Mount, [place], 20 Units at 05/23/20 [phone]  labetalol (NORMODYNE) injection 10 mg, 10 mg, Intravenous, Q2H PRN, Garvin Fila, MD, 10 mg at 05/20/20 3614 .  levETIRAcetam (KEPPRA) 750 mg in sodium chloride 0.9 % 100 mL IVPB, 750 mg, Intravenous, Q12H, Gwinda Maine, MD, Paused at 05/23/20 [phone] .  MEDLINE mouth rinse, 15 mL, Mouth Rinse, 10 times per day, Noemi Chapel P, DO, 15 mL at 05/23/20 1752 .  metoprolol tartrate (LOPRESSOR) tablet 100 mg, 100 mg, Per Tube, TID, Merlene Laughter F, NP, 100 mg at 05/23/20 1633 .  ondansetron (ZOFRAN) injection 4 mg, 4 mg, Intravenous, Q6H PRN, Kipp Brood, MD, 4 mg at 05/19/20 1339 .  pantoprazole sodium (PROTONIX) 40 mg/20 mL oral suspension 40 mg, 40 mg, Per Tube, Daily, Julian Hy, DO, 40 mg at 05/23/20 0820 .  polyethylene glycol (MIRALAX / GLYCOLAX) packet 17 g, 17 g, Per Tube, Daily, Corey Harold, NP, 17 g at 05/23/20 0820 .  senna-docusate (Senokot-S) tablet 1 tablet, 1 tablet, Per Tube, BID, Rosalin Hawking, MD, 1 tablet at 05/23/20 0820 .  thiamine tablet 100 mg, 100 mg, Per Tube,  Daily, Bailey-Modzik, Delila A, NP, 100 mg at 05/23/20 0820  EEG done yesterday for concern of seizures-nonspecific.  ASSESSMENT/PLAN [name] is a 70 y.o. male with history of HTN, DM, CAD/MI, afib on eliquis presenting with AMS, garbled speech, N/V and behavior change.   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 Including filling both the third ventricle with layering blood in  the fourth and lateral ventricle  Repeat CT head 05/21/2020-done for concern for rightward gaze-with no new changes. CTA head and neck with no LVO.  2D Echo EF 45-50%, No shunt  LDL 84  HgbA1c 9.4  VTE prophylaxis - SCDs, Lovenox $RemoveBefor'40mg'[id number]$  daily  Eliquis (apixaban) daily prior to admission, now on No antithrombotic  Therapy recommendations:  SNF  Disposition:  Pending   IVH  S/p EVD  2/27 Neurosurgery instilled tPA into EVD  3/1 EVD weaned to 20 cm H20, ICPs stable  3/2 failed clamping trial  3/5 EVD accidentally dislodged  CT head repeat 3/6 stable, repeat at night due to concern for gaze deviation also stable.  NSG signed off  Hypertensive emergency  Home meds:  Lotensin, metoprolol  Stable on the low end  On Benazepril $RemoveBefor'40mg'scyLYgInEARU$  daily, metoprolol $RemoveBeforeDEI'50mg'[id number]$  bid, cardizem 60 Q6 . 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   Continue tele monitoring   EF 45%   Concern for seizure 2 night ago. No recurrence   EEG  with no evidence of seizures.  Severe diffuse encephalopathy of nonspecific etiology.  Continue Keppra 750 twice daily   HYPERNATREMIA Noted on labs today-Na 148-appreciate CCM assistance in management - likley multifactorial due to fluids, fluid shifts, Zosyn.  Considering [place] addition.  Trending BMP.   Respiratory failure and LLL Pneumonia Fever   Extubated 3/1, vomited post extubation  Tachypnea re-intubated 3/5  CCM on board and appreciated  Empiric antibiotics Zosyn and vanco   WBC 8.9 -> 11.2->8.4->9.2  Tmax 102.8->101.6  Blood cultures 3/4 pending  Sputum culture pending  Appreciate CCM assistance with management-weaning today  Dysphagia  Likely due to hemorrhagic stroke  Failed swallow eval  On TF @ 55  Speech on board  Hyperlipidemia  Home meds:  crestor 20  LDL 84, goal < 70  May continue statin at discharge  Diabetes type II Uncontrolled with hyperglycemia and episodes of hpoglycemia.  Home meds:  insulin  HgbA1c 9.4, goal < 7.0  CBGs  SSI  On Lantus to 20->25u BID  Appreciate Diabetes Coordinator recommendations and appreciate CCM making changes on rounds today  Other Stroke Risk Factors  Advanced Age >/= 78   Hx stroke/TIA  Coronary artery disease and MI   Hospital day #12  This patient is critically ill due to [place] with IVH, respiratory failure, hydrocephalus s/p EVD, afib, fever, pneumonia and at significant risk of neurological worsening, death form hematoma expansion, obstructive hydrocephalus, heart failure, seizure, sepsis. This patient's care requires constant monitoring of vital signs, hemodynamics, respiratory and cardiac monitoring, review of multiple databases, neurological assessment, discussion with family, other specialists and medical decision making of high complexity. I spent 32 minutes of neurocritical care time in the care of this patient.  -- Amie Portland, MD Stroke Neurology Pager:  [phone]    To contact Stroke Continuity provider, please refer to [email or web address] After hours, contact General Neurology

## 2020-05-23 NOTE — Progress Notes (Signed)
NAME:  Jason [name], MRN:  [id number], DOB:  11/03/1950, LOS: 12 ADMISSION DATE:  05/11/2020, CONSULTATION DATE:  2/25 REFERRING MD:  Dr. Iver Nestle (Neurology) CHIEF COMPLAINT:  AMS  Brief History:  70 year old male admitted with ICH (s/p KCentra in the setting of Eliquis) and concern for hydrocephalus; ultimately requiring intubation for airway protection. EVD placed by Neurosurgery 2/25 (out 3/5).  Past Medical History:  CAD, T2DM, HTN, PAF (on Eliquis), CVA  Significant Hospital Events:  2/24 Admitted for ICH, KCentra given in the setting of Eliquis 2/25 Intubated, EVD placed 3/1 Extubated 3/2 EVD clamped, however worsening mental status so reopened 3/5 Reintubated, EVD removed 3/7 Attempted vent wean, limited by tachypnea 3/8 Extubated  Consults:  Neurosurgery, PCCM  Procedures:  ETT 2/25 >> 3/1, 3/5 >> 3/8 EVD 2/25 >> 3/5  Significant Diagnostic Tests:   CT head 2/24 > 1.2cm thalamic hemorrhage with intraventricular extension. Chronic right corona radiata lacunar insult.   CT head 2/25 > Medial left thalamic intraparenchymal hemorrhage appears the same, approximately 1 cm in size. Extension into the ventricular system with the amount of intraventricular blood being very similar. Ventriculomegaly appears the same without further dilatation.  Echocardiogram 05/12/20 estimation, is 45 to 50%.The left ventricle has no  regional wall motion abnormalities.  Micro Data:  Blood 2/24 >> no growth final Urine 2/24 >> no  growth final Resp Cx 3/2 >> normal flora Blood Cx 3/4 >> Sputum 3/4 >> Few GPC, rare GVR >>  Antimicrobials:  Cefazolin 2/24 >> 3/1 Rocephin 3/1 >> 3/2 Zosyn 3/3 >> Vanc 3/5 >>  Interim History / Subjective:  Weaning on vent, mildly tachypneic Much more responsive today WBC stable, Tmax 99.36F/24H Remains on Zosyn/Vanc Plan to extubate  Objective   Blood pressure 138/68, pulse (!) 49, temperature 98.4 F (36.9 C), temperature source Axillary, resp. rate  (!) 28, height 5\' 10"  (1.778 m), weight 96.1 kg, SpO2 100 %.    Vent Mode: PRVC FiO2 (%):  [30 %] 30 % Set Rate:  [18 bmp] 18 bmp Vt Set:  [580 mL] 580 mL PEEP:  [5 cmH20] 5 cmH20 Plateau Pressure:  [14 cmH20-21 cmH20] 21 cmH20   Intake/Output Summary (Last 24 hours) at 05/23/2020 0907 Last data filed at 05/23/2020 0800 Gross per 24 hour  Intake 1289.96 ml  Output 800 ml  Net 489.96 ml   Filed Weights   05/17/20 0454 05/18/20 0437 05/23/20 0443  Weight: 96.4 kg 96.5 kg 96.1 kg   Physical Exam: General: Critically ill-appearing man, laying in bed, in NAD. HEENT: Anicteric sclera, PERRL, slight rightward gaze (improved from previous exam), moist mucous membranes, ETT in place. Prior EVD site with intact suture. Neuro: Mildly sedated, opens eyes to verbal stimuli, rightward gaze as able, able to track to midline. Following commands consistently today.  CV: Irregularly irregular rhythm. PULM: Breathing even and mildly labored, weaning on vent (PS 8/5). Lung fields coarse bilaterally. GI: Soft, nontender, mildly distended. Extremities: No LE edema noted, s/p R great toe amputation. Skin: Warm/dry, no rashes.  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 Likely hypertensive in nature. Anticoagulated on Eliquis, KCentra given in ED. S/p EVD placement 2/25 (removed 3/5). EEG 3/6 demonstrating generalized slowing c/f moderate to severe diffuse encephalopathy. S/p Keppra load. - Appreciate Neuro/Neurosurgery assistance - Neuroprotective measures: HOB > 30 degrees, normoglycemia - Continue Keppra  Hypertensive emergency SBP on arrival to [facility] ED 205/120. -  Goal SBP < 160 - Continue metoprolol, diltiazem, benazepril - Hydral/Labetalol PRN  Acute respiratory failure with hypoxia requiring MV Possibly aspiration pneumonia at admission Initially extubated 3/1  with a few episodes of vomiting post extubation, unfortunately reintubated 3/5PM. Remains intermittently febrile with left lower lobe infiltrate. - Extubated 3/8AM - Wean FiO2 for goal sat > 90% - Pulmonary hygiene - F/u respiratory Cx  Sphenoid sinusitis on CT scan - Monitor, Vanc/Zosyn discontinued 3/8 in the setting of hypernatremia  Atrial fibrillation alternating with A- flutter on Eliquis CAD Chronic HFrEF, EF 45% - Cardiology following, appreciate assistance - Continue increased dose dilt, metop for rate control - Not a candidate for AC at present, given ICH - Resume statin at d/c - Continue tele - Maintain euvolemia - Maintain lytes WNL (K > 4, Mg > 2)  Hypernatremia Uptrending Na noted 3/7-3/8. Likely multifactorial: TFs, Zosyn, fluid shifts. - Discontinue Zosyn - Trend BMP - Consider [facility]WF addition  History of T2Dm Hypoglycemia Recent Hgb A1C 9.4. 3/6-3/8, patient was noted to have hypoglycemic episodes with lows in the 50s-70s. - Reduce Lantus to 20U BID - Reduce TF coverage to 3U Q4H - SSI PRN - Goal CBG 140-180  QTc prolongation - Avoid QTc-prolonging meds as able  Best practice (evaluated daily)  Diet: NPO, TFs Pain/Anxiety/Delirium protocol (if indicated): N/A VAP protocol (if indicated): N/A DVT prophylaxis: Lovenox GI prophylaxis: PPI Glucose control: SSI Mobility: Bedrest while intubated Disposition: ICU  Goals of Care:  Last date of multidisciplinary goals of care discussion: Per primary Family and staff present:  Summary of discussion:  Follow up goals of care discussion due: Code Status: FULL  Critical care time: 34 minutes    Tim Lair, PA-C [facility] 05/23/20 9:07 AM  Please see [email or web address] for pager details.

## 2020-05-23 NOTE — Procedures (Signed)
Extubation Procedure Note  Patient Details:   Name: Jason [name] DOB: Nov 06, 1950 MRN: [id number]   Airway Documentation:    Vent end date: 05/23/20 Vent end time: 1027   Evaluation  O2 sats: stable throughout Complications: No apparent complications Patient did tolerate procedure well. Bilateral Breath Sounds: Clear,Diminished   No   Pt extubated to 4L [place] per MD order. Pt has a strong congested cough but is unable/unwilling to speak at this time. Positive cuff leak noted prior to extubation. VS within normal limits.   Carolan Shiver 05/23/2020, 10:28 AM

## 2020-05-23 NOTE — Progress Notes (Signed)
Pt extubated at this time. Pt follows commands but does not answer questions and does not speak at this time.

## 2020-05-23 NOTE — Progress Notes (Signed)
   Notes reviewed. Signing off. Please let us know if we can be of further assistance  Donato Schultz, MD

## 2020-05-24 ENCOUNTER — Inpatient Hospital Stay (HOSPITAL_COMMUNITY): Payer: Medicare HMO

## 2020-05-24 DIAGNOSIS — T17908A Unspecified foreign body in respiratory tract, part unspecified causing other injury, initial encounter: Secondary | ICD-10-CM | POA: Diagnosis not present

## 2020-05-24 DIAGNOSIS — J9601 Acute respiratory failure with hypoxia: Secondary | ICD-10-CM | POA: Diagnosis not present

## 2020-05-24 DIAGNOSIS — I629 Nontraumatic intracranial hemorrhage, unspecified: Secondary | ICD-10-CM | POA: Diagnosis not present

## 2020-05-24 DIAGNOSIS — I615 Nontraumatic intracerebral hemorrhage, intraventricular: Secondary | ICD-10-CM | POA: Diagnosis not present

## 2020-05-24 LAB — CBC
HCT: 46.2 % (ref 39.0–52.0)
Hemoglobin: 14.5 g/dL (ref 13.0–17.0)
MCH: 28.4 pg (ref 26.0–34.0)
MCHC: 31.4 g/dL (ref 30.0–36.0)
MCV: 90.4 fL (ref 80.0–100.0)
Platelets: 211 10*3/uL (ref 150–400)
RBC: 5.11 MIL/uL (ref 4.22–5.81)
RDW: 14.6 % (ref 11.5–15.5)
WBC: 12.2 10*3/uL — ABNORMAL HIGH (ref 4.0–10.5)
nRBC: 0 % (ref 0.0–0.2)

## 2020-05-24 LAB — HEPATIC FUNCTION PANEL
ALT: 57 U/L — ABNORMAL HIGH (ref 0–44)
AST: 72 U/L — ABNORMAL HIGH (ref 15–41)
Albumin: 2.2 g/dL — ABNORMAL LOW (ref 3.5–5.0)
Alkaline Phosphatase: 63 U/L (ref 38–126)
Bilirubin, Direct: 0.2 mg/dL (ref 0.0–0.2)
Indirect Bilirubin: 0.3 mg/dL (ref 0.3–0.9)
Total Bilirubin: 0.5 mg/dL (ref 0.3–1.2)
Total Protein: 6.5 g/dL (ref 6.5–8.1)

## 2020-05-24 LAB — POCT I-STAT 7, (LYTES, BLD GAS, ICA,H+H)
Acid-Base Excess: 4 mmol/L — ABNORMAL HIGH (ref 0.0–2.0)
Bicarbonate: 28.3 mmol/L — ABNORMAL HIGH (ref 20.0–28.0)
Calcium, Ion: 1.17 mmol/L (ref 1.15–1.40)
HCT: 38 % — ABNORMAL LOW (ref 39.0–52.0)
Hemoglobin: 12.9 g/dL — ABNORMAL LOW (ref 13.0–17.0)
O2 Saturation: 96 %
Patient temperature: 98.5
Potassium: 3.6 mmol/L (ref 3.5–5.1)
Sodium: 151 mmol/L — ABNORMAL HIGH (ref 135–145)
TCO2: 29 mmol/L (ref 22–32)
pCO2 arterial: 38.6 mmHg (ref 32.0–48.0)
pH, Arterial: 7.473 — ABNORMAL HIGH (ref 7.350–7.450)
pO2, Arterial: 77 mmHg — ABNORMAL LOW (ref 83.0–108.0)

## 2020-05-24 LAB — BASIC METABOLIC PANEL
Anion gap: 11 (ref 5–15)
BUN: 27 mg/dL — ABNORMAL HIGH (ref 8–23)
CO2: 25 mmol/L (ref 22–32)
Calcium: 8.3 mg/dL — ABNORMAL LOW (ref 8.9–10.3)
Chloride: 112 mmol/L — ABNORMAL HIGH (ref 98–111)
Creatinine, Ser: 0.82 mg/dL (ref 0.61–1.24)
GFR, Estimated: >60 mL/min (ref >60)
Glucose, Bld: 140 mg/dL — ABNORMAL HIGH (ref 70–99)
Potassium: 3.6 mmol/L (ref 3.5–5.1)
Sodium: 148 mmol/L — ABNORMAL HIGH (ref 135–145)

## 2020-05-24 LAB — GLUCOSE, CAPILLARY
Glucose-Capillary: 125 mg/dL — ABNORMAL HIGH (ref 70–99)
Glucose-Capillary: 145 mg/dL — ABNORMAL HIGH (ref 70–99)
Glucose-Capillary: 194 mg/dL — ABNORMAL HIGH (ref 70–99)
Glucose-Capillary: 206 mg/dL — ABNORMAL HIGH (ref 70–99)
Glucose-Capillary: 207 mg/dL — ABNORMAL HIGH (ref 70–99)
Glucose-Capillary: 212 mg/dL — ABNORMAL HIGH (ref 70–99)

## 2020-05-24 LAB — CULTURE, BLOOD (ROUTINE X 2)
Culture: NO GROWTH
Culture: NO GROWTH
Special Requests: ADEQUATE

## 2020-05-24 MED ORDER — LEVETIRACETAM 100 MG/ML PO SOLN
750.0000 mg | Freq: Two times a day (BID) | ORAL | Status: DC
  Administered 2020-05-24 – 2020-06-07 (×29): 750 mg
  Filled 2020-05-24 (×31): qty 10

## 2020-05-24 NOTE — Progress Notes (Signed)
STROKE TEAM PROGRESS NOTE   SUBJECTIVE (INTERVAL HISTORY) Extubated 3/8 1027  S/p reintubation  on 3/6 for resp failure,  Concern for seizure activity with EEG showing no seizure activity yesterday.  On Keppra 748m BID Tmax 99.2  CBC    Component Value Date/Time   WBC 12.2 (H) 05/24/2020 0327   RBC 5.11 05/24/2020 0327   HGB 14.5 05/24/2020 0327   HCT 46.2 05/24/2020 0327   PLT 211 05/24/2020 0327   MCV 90.4 05/24/2020 0327   MCH 28.4 05/24/2020 0327   MCHC 31.4 05/24/2020 0327   RDW 14.6 05/24/2020 0327   LYMPHSABS 1.0 05/18/2020 0920   MONOABS 1.3 (H) 05/18/2020 0920   EOSABS 0.0 05/18/2020 0920   BASOSABS 0.1 05/18/2020 0920   BMP Latest Ref Rng & Units 05/24/2020 05/23/2020 05/22/2020  Glucose 70 - 99 mg/dL 140(H) 71 186(H)  BUN 8 - 23 mg/dL 27(H) 28(H) 39(H)  Creatinine 0.61 - 1.24 mg/dL 0.82 0.88 1.10  Sodium 135 - 145 mmol/L 148(H) 148(H) 142  Potassium 3.5 - 5.1 mmol/L 3.6 3.4(L) 3.7  Chloride 98 - 111 mmol/L 112(H) 110 107  CO2 22 - 32 mmol/L _0 Calcium 8.9 - 10.3 mg/dL 8.3(L) 8.2(L) 7.9(L)    IMAGING past 24 hours EEG adult  Result Date: 05/14/2020  IMPRESSION: This study is suggestive of moderate to evere diffuse encephalopathy, nonspecific etiology. No seizures or epileptiform discharges were seen throughout the recording.  PHYSICAL EXAM Temp:  [98.2 F (36.8 C)-99.2 F (37.3 C)] 99.1 F (37.3 C) (03/09 0400) Pulse Rate:  [48-99] 99 (03/09 0700) Resp:  [23-46] 43 (03/09 0700) BP: (132-164)/(64-75) 140/66 (03/09 0700) SpO2:  [98 %-100 %] 99 % (03/09 0700) Weight:  [95.1 kg] 95.1 kg (03/09 0500) General: Comfortably laying in bed, extubated yesterday HEENT: Normocephalic, atraumatic Cardiovascular: A flutter with regular ventricular rate Respiratory: Scattered rales with mildly increased effort of breathing. Abd: tender all over to palpaton Extremities warm well perfused with evidence of longstanding vascular disease, multiple surgeries and  amputation in the right toes Neurological exam Extubated yesterday.  No sedation. Neglects examiner on the left side Is able to follow commands on the right. Is able to verbalize his name although he is very hypophonic. Pupils are equal round reactive to light Extraocular movement exam reveals right gaze preference with inability to cross the midline to the left. Inconsistent blink to threat Question left lower facial weakness  Motor: Initially appeared that he was moving his right side much better to command but he has poor attention concentration and today was able to grasp my fingers on both sides very strongly and grimaced equally to noxious stimulation in both legs although he did not withdraw either of the legs and did not wiggle his toes to command Sensory: As above  MED LIST  Current Facility-Administered Medications:  .  0.9 %  sodium chloride infusion, 250 mL, Intravenous, Continuous, Clark, Laura P, DO .  acetaminophen (TYLENOL) tablet 650 mg, 650 mg, Oral, Q4H PRN **OR** acetaminophen (TYLENOL) 160 MG/5ML solution 650 mg, 650 mg, Per Tube, Q4H PRN, 650 mg at 05/21/20 1215 **OR** acetaminophen (TYLENOL) suppository 650 mg, 650 mg, Rectal, Q4H PRN, Bhagat, Srishti L, MD, 650 mg at 05/17/20 1441 .  atorvastatin (LIPITOR) tablet 40 mg, 40 mg, Per NG tube, q1800, Olivencia-Simmons, Ivelisse, NP, 40 mg at 05/23/20 1751 .  chlorhexidine gluconate (MEDLINE KIT) (PERIDEX) 0.12 % solution 15 mL, 15 mL, Mouth Rinse, BID, CNoemi ChapelP, DO, 15 mL at 05/23/20  2001 .  Chlorhexidine Gluconate Cloth 2 % PADS 6 each, 6 each, Topical, Daily, Bhagat, Srishti L, MD, 6 each at 05/23/20 1633 .  diltiazem (CARDIZEM) tablet 90 mg, 90 mg, Per Tube, Q6H, Lestine Mount, PA-C, 90 mg at 05/24/20 0526 .  enoxaparin (LOVENOX) injection 40 mg, 40 mg, Subcutaneous, Daily, Garvin Fila, MD, 40 mg at 05/23/20 0820 .  feeding supplement (JEVITY 1.5 CAL/FIBER) liquid 1,000 mL, 1,000 mL, Per Tube, Continuous,  Garvin Fila, MD, Last Rate: 55 mL/hr at 05/23/20 1324, 1,000 mL at 05/23/20 1324 .  feeding supplement (PROSource TF) liquid 45 mL, 45 mL, Per Tube, TID, Spero Geralds, MD, 45 mL at 05/23/20 2154 .  fentaNYL (SUBLIMAZE) injection 100 mcg, 100 mcg, Intravenous, Once, Julian Hy, DO .  fentaNYL (SUBLIMAZE) injection 25 mcg, 25 mcg, Intravenous, Q15 min PRN, Noemi Chapel P, DO .  fentaNYL (SUBLIMAZE) injection 25-100 mcg, 25-100 mcg, Intravenous, Q30 min PRN, Julian Hy, DO, 100 mcg at 05/23/20 0216 .  hydrALAZINE (APRESOLINE) injection 20 mg, 20 mg, Intravenous, Q6H PRN, Garvin Fila, MD, 20 mg at 05/19/20 1220 .  insulin aspart (novoLOG) injection 0-20 Units, 0-20 Units, Subcutaneous, Q4H, Magdalen Spatz, NP, 3 Units at 05/24/20 0418 .  insulin aspart (novoLOG) injection 3 Units, 3 Units, Subcutaneous, Q4H, Lestine Mount, [place], 3 Units at 05/24/20 [phone]  insulin glargine (LANTUS) injection 20 Units, 20 Units, Subcutaneous, BID, [place] Crane M, [place], 20 Units at 05/23/20 2204 .  labetalol (NORMODYNE) injection 10 mg, 10 mg, Intravenous, Q2H PRN, Garvin Fila, MD, 10 mg at 05/20/20 8916 .  levETIRAcetam (KEPPRA) 750 mg in sodium chloride 0.9 % 100 mL IVPB, 750 mg, Intravenous, Q12H, Gwinda Maine, MD, Stopped at 05/23/20 2209 .  MEDLINE mouth rinse, 15 mL, Mouth Rinse, q12n4p, Amie Portland, MD .  metoprolol tartrate (LOPRESSOR) tablet 100 mg, 100 mg, Per Tube, TID, Merlene Laughter F, NP, 100 mg at 05/23/20 2155 .  ondansetron (ZOFRAN) injection 4 mg, 4 mg, Intravenous, Q6H PRN, Kipp Brood, MD, 4 mg at 05/19/20 1339 .  pantoprazole sodium (PROTONIX) 40 mg/20 mL oral suspension 40 mg, 40 mg, Per Tube, Daily, Julian Hy, DO, 40 mg at 05/23/20 0820 .  polyethylene glycol (MIRALAX / GLYCOLAX) packet 17 g, 17 g, Per Tube, Daily, Corey Harold, NP, 17 g at 05/23/20 0820 .  senna-docusate (Senokot-S) tablet 1 tablet, 1 tablet, Per Tube, BID, Rosalin Hawking, MD, 1 tablet  at 05/23/20 2154 .  thiamine tablet 100 mg, 100 mg, Per Tube, Daily, Bailey-Modzik, Delila A, NP, 100 mg at 05/23/20 0820   ASSESSMENT/PLAN [name] is a 70 y.o. male with history of HTN, DM, CAD/MI, afib on eliquis presenting with AMS, garbled speech, N/V and behavior change.   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 Including filling both the third ventricle with layering blood in the fourth and lateral ventricle  Repeat CT head 05/21/2020-done for concern for rightward gaze-with no new changes. CTA head and neck with no LVO. Thalamic ICH is resolving and blood seen layered in the posterior horns of lateral  ventricles.  2D Echo EF 45-50%, No shunt  LDL 84  HgbA1c 9.4  VTE prophylaxis - SCDs, Lovenox 44m daily  Eliquis (apixaban) daily prior to admission, now on No antithrombotic  Therapy recommendations:  SNF  Disposition:  Pending  IVH  S/p EVD  2/27 Neurosurgery instilled tPA into EVD  3/1 EVD weaned to 20 cm H20, ICPs stable  3/2 failed clamping trial  3/5 EVD accidentally dislodged. Reintubated.   CT head repeat 3/6 stable, repeat at night due to concern for gaze deviation also stable.   3/7NSG signed off.   Extubated 3/8. EEG showed no seizure  Will repeat CT head today  Hypertensive emergency  Home meds:  Lotensin, metoprolol  Stable on the low end  On Benazepril 493mdaily, metoprolol 5045mid, cardizem 60 Q6 . Long-term BP goal normotensive  [facility]   On eliquis PTA  Reversed by Kcentra  No antithrombotics/anticoagulants due to ICHAtwoodontinue po cardizem  and metoprolol for rate control  Cardiology consult appreciated: Rate control adequate.   Continue tele monitoring   EF 45%   Concern for seizure 3 nights ago. No recurrence   EEG with no evidence of seizures.  Severe diffuse encephalopathy of nonspecific etiology.  Continue Keppra 750 twice daily   HYPERNATREMIA -Na 148 -unchanged from yesterday-appreciate CCM assistance in management - likley multifactorial due to fluids, fluid shifts, Zosyn.  Considering [place] addition.  Trending BMP. ADD FREE WATER 100cc q6h  ABDOMINAL TENDERNESS -check abd xray and LFT   Respiratory failure and LLL Pneumonia Fever - resolved  Extubated 3/1, vomited post extubation  Tachypnea re-intubated 3/5 and extubated 3/8  CCM on board and appreciated  Empiric antibiotics-completed 5 days of Zosyn.   WBC 12.2 (stable in last 3 days)  Tmax 99.2  Blood cultures 3/4 pending-no growth in 4 days  Sputum culture pending-ABUNDANT WBC PRESENT,BOTH PMN AND MONONUCLEAR  FEW SQUAMOUS EPITHELIAL CELLS PRESENT  FEW GRAM POSITIVE COCCI  RARE GRAM VARIABLE ROD   Appreciate CCM assistance with management  Dysphagia  Likely due to hemorrhagic stroke  Failed swallow eval  On TF @ 55 7peech on board  Hyperlipidemia  Home meds:  crestor 20  LDL 84, goal < 70  May continue statin at discharge  Diabetes type II Uncontrolled with hyperglycemia and episodes of hpoglycemia.  Home meds:  insulin  HgbA1c 9.4, goal < 7.0  SSI  Appreciate CCM assistance in management  Other Stroke Risk Factors  Advanced Age >/= 65 47Hx stroke/TIA  Coronary artery disease and MI   Hospital day #13  CRITICAL CARE ATTESTATION Performed by: AshAmie PortlandD Total critical care time: 36 minutes Critical care time was exclusive of separately billable procedures and treating other patients and/or supervising APPs/Residents/Students Critical care was necessary to treat or prevent imminent or  life-threatening deterioration due to ICHGattmanVH, resp failure This patient is critically ill and at significant risk for neurological worsening and/or death and care requires constant monitoring. Critical care was time spent personally by me on the following activities: development of treatment plan with patient and/or surrogate as well as nursing, discussions with consultants, evaluation of patient's response to treatment, examination of patient, obtaining history from patient or surrogate, ordering and performing treatments and interventions, ordering and review of laboratory studies, ordering and review of radiographic studies, pulse oximetry, re-evaluation of patient's condition, participation in multidisciplinary rounds and medical decision making of high complexity in the care of this patient.   --  Amie Portland, MD Stroke Neurology Pager: [phone]   To contact Stroke Continuity provider, please refer to [email or web address] After hours, contact General Neurology

## 2020-05-24 NOTE — Progress Notes (Signed)
Occupational Therapy Progress Note  Pt moved to EOB sitting with RN assisting.  He required total A +2 for bed mobility, and required max A to sit EOB. He followed simple one step commands ~50% of the time.     05/24/20 1048  OT Visit Information  Last OT Received On 05/24/20  Assistance Needed +2  History of Present Illness 70 yo male presenting with AMS. 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 Pt s/p R frontal IVC placement, ETT 2/25-3/1. 3/5 pt reintubated and EVD removed.  He was extubated again on 3/8. Pt with recent admission after being found down at home with DKA. Other PMH includes: DM, HTN, CVA, CABG, afib with rvr, anxiety, and depression.  Precautions  Precautions Fall  Pain Assessment  Pain Assessment Faces  Faces Pain Scale 6  Pain Location uncertain -  moans and grimmaces with position change supine <> EOB  Pain Descriptors / Indicators Grimacing;Moaning  Pain Intervention(s) Monitored during session;Repositioned  Cognition  Arousal/Alertness Lethargic  Behavior During Therapy Flat affect  Overall Cognitive Status Impaired/Different from baseline  Area of Impairment Attention;Following commands;Problem solving  Orientation Level Disoriented to;Time;Situation  Current Attention Level Focused  Following Commands Follows one step commands inconsistently;Follows one step commands with increased time  Problem Solving Decreased initiation;Slow processing;Difficulty sequencing;Requires verbal cues;Requires tactile cues  General Comments Pt moved to EOB sitting.  He was able to touch his mouth on command with Lt UE after hand moved to his mouth - required max A to complete the command.  followed simple motor commands with ~50% accuracy. He did indicate that he was fatigued and stated "yes" when asked if he wanted to return to supine  Upper Extremity Assessment  Upper Extremity Assessment LUE deficits/detail;RUE deficits/detail  RUE Deficits /  Details difficult to accurately assess due to impaired cognition.  Grips are very strong.  edema noted  LUE Deficits / Details difficult to accurately assess due to impaired cognition.  bil. grips are strong.  Edema noted  Lower Extremity Assessment  Lower Extremity Assessment Defer to PT evaluation  ADL  General ADL Comments total A  Bed Mobility  Overal bed mobility Needs Assistance  Bed Mobility Supine to Sit;Sit to Supine  Supine to sit Total assist  Sit to supine Total assist  General bed mobility comments pt requires assist for all aspects  Balance  Overall balance assessment Needs assistance  Sitting-balance support Bilateral upper extremity supported;Feet supported  Sitting balance-Leahy Scale Zero  Sitting balance - Comments Pt moved to EOB sitting.  He required max A to maintain balance.  He will initiate righting reaction with a significant delay  Transfers  General transfer comment unable to attempt safely  General Comments  General comments (skin integrity, edema, etc.) RR high 20s - mid 40s with activity  OT - End of Session  Equipment Utilized During Treatment Oxygen  Activity Tolerance Patient limited by lethargy  Patient left in bed;with call bell/phone within reach;with bed alarm set  Nurse Communication Mobility status  OT Assessment/Plan  OT Plan Discharge plan remains appropriate  OT Visit Diagnosis Unsteadiness on feet (R26.81);Other abnormalities of gait and mobility (R26.89);Muscle weakness (generalized) (M62.81)  OT Frequency (ACUTE ONLY) Min 2X/week  Follow Up Recommendations SNF  OT Equipment None recommended by OT  AM-PAC OT "6 Clicks" Daily Activity Outcome Measure (Version 2)  Help from another person eating meals? 1  Help from another person taking care of personal grooming? 1  Help from another person toileting,  which includes using toliet, bedpan, or urinal? 1  Help from another person bathing (including washing, rinsing, drying)? 1  Help from  another person to put on and taking off regular upper body clothing? 1  Help from another person to put on and taking off regular lower body clothing? 1  6 Click Score 6  OT Goal Progression  Progress towards OT goals Not progressing toward goals - comment (lethargy)  OT Time Calculation  OT Start Time (ACUTE ONLY) 1011  OT Stop Time (ACUTE ONLY) 1028  OT Time Calculation (min) 17 min  OT General Charges  $OT Visit 1 Visit  OT Treatments  $Therapeutic Activity 8-22 mins  Eber Jones., OTR/L Acute Rehabilitation Services Pager [phone] Office [phone]

## 2020-05-24 NOTE — Progress Notes (Signed)
NAME:  Jason [name], MRN:  [id number], DOB:  11-08-1950, LOS: 13 ADMISSION DATE:  05/11/2020, CONSULTATION DATE:  2/25 REFERRING MD:  Dr. Iver Nestle (Neurology) CHIEF COMPLAINT:  AMS  Brief History:  70 year old male admitted with ICH (s/p KCentra in the setting of Eliquis) and concern for hydrocephalus; ultimately requiring intubation for airway protection. EVD placed by Neurosurgery 2/25 (out 3/5).  Past Medical History:  CAD, T2DM, HTN, PAF (on Eliquis), CVA  Significant Hospital Events:  2/24 Admitted for ICH, KCentra given in the setting of Eliquis 2/25 Intubated, EVD placed 3/1 Extubated 3/2 EVD clamped, however worsening mental status so reopened 3/5 Reintubated, EVD removed 3/7 Attempted vent wean, limited by tachypnea 3/8 Extubated, tachypneic 3/9 BiPAP trial  Consults:  Neurosurgery, PCCM  Procedures:  ETT 2/25 >> 3/1, 3/5 >> 3/8 EVD 2/25 >> 3/5  Significant Diagnostic Tests:   CT head 2/24 > 1.2cm thalamic hemorrhage with intraventricular extension. Chronic right corona radiata lacunar insult.   CT head 2/25 > Medial left thalamic intraparenchymal hemorrhage appears the same, approximately 1 cm in size. Extension into the ventricular system with the amount of intraventricular blood being very similar. Ventriculomegaly appears the same without further dilatation.  Echocardiogram 05/12/20 estimation, is 45 to 50%.The left ventricle has no  regional wall motion abnormalities.  Micro Data:  Blood 2/24 >> no growth final Urine 2/24 >> no  growth final Resp Cx 3/2 >> normal flora Blood Cx 3/4 >> Sputum 3/4 >> Few GPC, rare GVR >> rare normal flora  Antimicrobials:  Cefazolin 2/24 >> 3/1 Rocephin 3/1 >> 3/2 Zosyn 3/3 >> 3/8 Vanc 3/5 >> 3/8  Interim History / Subjective:  Slightly less interactive than yesterday afternoon Opens eyes, tracks, follows commands, less verbal Endorses pain today, unable to articulate where Tmax 99.73F/24H, WBC stable 12 Off of  antibiotics Tachypneic to 40s, "belly-breathing"  Objective   Blood pressure 140/66, pulse 99, temperature 99.1 F (37.3 C), temperature source Axillary, resp. rate (!) 43, height 5\' 10"  (1.778 m), weight 95.1 kg, SpO2 99 %.    Vent Mode: PRVC FiO2 (%):  [30 %] 30 % Set Rate:  [18 bmp] 18 bmp Vt Set:  [580 mL] 580 mL PEEP:  [5 cmH20] 5 cmH20 Plateau Pressure:  [21 cmH20] 21 cmH20   Intake/Output Summary (Last 24 hours) at 05/24/2020 0725 Last data filed at 05/24/2020 0700 Gross per 24 hour  Intake 2109 ml  Output 1375 ml  Net 734 ml   Filed Weights   05/18/20 0437 05/23/20 0443 05/24/20 0500  Weight: 96.5 kg 96.1 kg 95.1 kg   Physical Exam: General: Critically ill-appearing man, laying in bed, in NAD. Appears mildly uncomfortable. HEENT: [place]/AT, anicteric sclera, PERRL, slight rightward gaze, moist mucous membranes, Cortrak in place. Previous EVD site c/d/i with suture. Neuro: Drowsy/lethargic, opens eyes to verbal stimuli, rightward gaze preference but will track to midline; consistently following commands with BUE, inconsistently following commands with BLE. CV: Irregularly irregular rhythm. PULM: Breathing even and mildly labored on RA, tachypneic to 40s with belly-breathing, lung fields diminished at bilateral bases, otherwise CTA. GI: Soft, more firm/full over RLQ. TTP over bilateral lower quadrants. Hypoactive bowel sounds. Extremities: No LE edema noted, s/p R great toe amputation. Skin: Warm/dry, no rashes.  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 Likely hypertensive in nature. Anticoagulated on Eliquis, KCentra given in ED. S/p EVD placement 2/25 (removed 3/5). EEG  3/6 demonstrating generalized slowing c/f moderate to severe diffuse encephalopathy. S/p Keppra load. - Appreciate Neuro/Neurosurgery assistance - Neuroprotective measures: HOB  > 30 degrees, normoglycemia, normothermia, electrolytes WNL - Continue Keppra  Hypertensive emergency SBP on arrival to [facility] ED 205/120. - Goal SBP < 160 - continue metoprolol, diltiazem, benazepril - Hydral/Labetalol PRN  Acute respiratory failure with hypoxia requiring MV Possibly aspiration pneumonia at admission Initially extubated 3/1 with a few episodes of vomiting post extubation, unfortunately reintubated 3/5PM. Remains intermittently febrile with left lower lobe infiltrate. Extubated 3/8AM. - Wean FiO2 for goal sat > 90% - Pulmonary hygiene - F/u finalized respiratory culture - ABG today - Trial BiPAP  Acute abdominal pain Noted on exam 3/8AM, patient endorses pain/winces when BLQ are palpated. - F/u Abdominal XR  Sphenoid sinusitis  Noted incidentally on CT scan. Vanc/Zosyn discontinued 3/8 in the setting of hypernatremia. - Continue to monitor clinically  Atrial fibrillation alternating with A- flutter on Eliquis CAD Chronic HFrEF, EF 45% - Cardiology following, appreciate assistance - Continue diltiazem, metop for rate control - Not a candidate for [facility] at present with ICH - Cardiology following, appreciate assistance - Continue increased dose dilt, metop for rate control - Prophylactic AC only (Lovenox) at present, given ICH - Resume statin at discharge - Continue tele - Maintain lyte goals (K > 4, Mg  > 2)  Hypernatremia Uptrending Na noted 3/7-3/8. Likely multifactorial: TFs, Zosyn, fluid shifts. Zosyn discontinued 3/8. - Trend BMP - FWF Q6H per Neuro  History of T2DM Hypoglycemia Recent Hgb A1C 9.4. 3/6-3/8, patient was noted to have hypoglycemic episodes with lows in the 50s-70s. - Continue Lantus 20U BID - Continue TF coverage 3U Q4H - SSI - Goal CBG 140-180  QTc prolongation - Avoid QTc-prolonging meds as able  Physical deconditioning At risk for malnutrition - Appreciate nutrition assistance, hold TFs while on BiPAP - PT/OT/SLP evals  Best  practice (evaluated daily)  Diet: NPO, TFs Pain/Anxiety/Delirium protocol (if indicated): N/A VAP protocol (if indicated): N/A DVT prophylaxis: Lovenox GI prophylaxis: PPI Glucose control: SSI Mobility: Bedrest while intubated Disposition: ICU  Goals of Care:  Last date of multidisciplinary goals of care discussion: Per primary Family and staff present:  Summary of discussion:  Follow up goals of care discussion due: Code Status: FULL  Critical care time: 35 minutes    Tim Lair, PA-C [facility] 05/24/20 7:25 AM  Please see [email or web address] for pager details.

## 2020-05-24 NOTE — Progress Notes (Signed)
Nutrition Follow-up  DOCUMENTATION CODES:   Not applicable  INTERVENTION:   Tube Feeding via Cortrak:  Continue Jevity 1.5 at 55 ml/h (1320 ml per day) Prosource TF 45 ml TID  Provides 2100 kcal, 117 gm protein, 1003 ml free water daily   NUTRITION DIAGNOSIS:   Inadequate oral intake related to inability to eat as evidenced by NPO status.  Being addressed via TF   GOAL:   Patient will meet greater than or equal to 90% of their needs  Progressing  MONITOR:   TF tolerance  REASON FOR ASSESSMENT:   Consult,Ventilator Enteral/tube feeding initiation and management  ASSESSMENT:   Pt with PMH of anxiety, depression, DM, HTN, stroke, and MI admitted with ICH likely due to HTN.  2/25 s/p EVD and intubation  3/01 extubated; vomited post extubation concern for aspiration PNA 3/02 failed CVD clamping; cortrak placed with tip gastric per xray  3/03 Cortrak placed 3/05 EVD removed, Re-intubated 3/8 extubated   Pt discussed during ICU rounds and with RN. Per RN/SLP pt too lethargic for swallow eval, remains NPO.   Tolerating Jevity 1.5 at 55 ml/hr with Pro-source TF 45 ML TID via Cortrak  Current wt 95.1 kg; admit weight 94 kg. Net +10 L per I/o flow sheet  Rectal tube with liquid stool  Labs: reviewed CBG's: 194-212 Meds: ss novolog, novolog q 4 hours, lantus, miralax, senokot-s,  thiamine  Diet Order:   Diet Order            Diet NPO time specified  Diet effective now                 EDUCATION NEEDS:   No education needs have been identified at this time  Skin:  Skin Assessment: Reviewed RN Assessment  Last BM:  3/8 rectal tube  Height:   Ht Readings from Last 1 Encounters:  05/12/20 5\' 10"  (1.778 m)    Weight:   Wt Readings from Last 1 Encounters:  05/24/20 95.1 kg    BMI:  Body mass index is 30.08 kg/m.  Estimated Nutritional Needs:   Kcal:  2100-2300  Protein:  115-130 grams  Fluid:  >2 L/day   07/24/20., RD, LDN, CNSC See  AMiON for contact information

## 2020-05-24 NOTE — Evaluation (Signed)
Clinical/Bedside Swallow Evaluation Patient Details  Name: Jason [name] MRN: [id number] Date of Birth: 07/21/1950  Today's Date: 05/24/2020 Time: SLP Start Time (ACUTE ONLY): 1050 SLP Stop Time (ACUTE ONLY): 1058 SLP Time Calculation (min) (ACUTE ONLY): 8 min  Past Medical History:  Past Medical History:  Diagnosis Date  . Anxiety   . Coronary artery disease   . Depression   . Diabetes mellitus without complication (HCC)   . Dysrhythmia   . Hypertension   . Myocardial infarction (HCC)   . Stroke [facility])    Past Surgical History:  Past Surgical History:  Procedure Laterality Date  . CORONARY ARTERY BYPASS GRAFT     HPI:  70yo male admitted 05/11/20 with AMS. PMH: HTN, DM, CVA, CAD, MI, CABG, AFib, anxiety/depression, recent fall (02/2020) with DKA/HHS, memory impairment.  CTHead - hemorrhage with intraventricular extension and possibly mildly increased ventricular size. Intubated 2/25 - 3/1.Evaluated 3/2, on 3/4 recommended full liquids, pt passed [place], worsening mental status, reintubated 3/5-3/8.   Assessment / Plan / Recommendation Clinical Impression  Pt presents with eyes open but minimally responsive to commands. Pt responds to PO presentation by opening mouth to accept a spoon, but does not consistently orally manipulate ice chip. He is more responsive to a teaspoon of nectar and does initiate a swallow with two trials. He will not follow commands to phonate and does not verbally respond. He has a high potential for silent aspiration and his mentation is not yet approprite for PO. Recommend NPO, SLP will f/u for trials as he becomes more responsive. SLP Visit Diagnosis: Dysphagia, unspecified (R13.10)    Aspiration Risk  Moderate aspiration risk;Severe aspiration risk    Diet Recommendation NPO;Alternative means - temporary        Other  Recommendations Oral Care Recommendations: Oral care QID   Follow up Recommendations Skilled Nursing facility      Frequency and Duration  min 2x/week  2 weeks       Prognosis Prognosis for Safe Diet Advancement: Good      Swallow Study   General HPI: 70yo male admitted 05/11/20 with AMS. PMH: HTN, DM, CVA, CAD, MI, CABG, AFib, anxiety/depression, recent fall (02/2020) with DKA/HHS, memory impairment.  CTHead - hemorrhage with intraventricular extension and possibly mildly increased ventricular size. Intubated 2/25 - 3/1.Evaluated 3/2, on 3/4 recommended full liquids, pt passed [place], worsening mental status, reintubated 3/5-3/8. Type of Study: Bedside Swallow Evaluation Previous Swallow Assessment: BSE 03/07/20 - Dys3/thin; BSE 3/4 - full liquids Diet Prior to this Study: NPO Temperature Spikes Noted: No Respiratory Status: Room air History of Recent Intubation: Yes Length of Intubations (days): 8 days Date extubated: 05/23/20 Behavior/Cognition: Doesn't follow directions;Lethargic/Drowsy Oral Care Completed by SLP: No Oral Cavity - Dentition: Poor condition;Missing dentition Self-Feeding Abilities: Total assist Patient Positioning: Upright in bed Baseline Vocal Quality: Not observed Volitional Cough: Cognitively unable to elicit    Oral/Motor/Sensory Function Overall Oral Motor/Sensory Function: Generalized oral weakness   Ice Chips Ice chips: Impaired Presentation: Spoon Oral Phase Impairments: Poor awareness of bolus;Reduced lingual movement/coordination Oral Phase Functional Implications: Oral holding   Thin Liquid Thin Liquid: Not tested    Nectar Thick Nectar Thick Liquid: Impaired Presentation: Spoon Oral Phase Impairments: Poor awareness of bolus   Honey Thick Honey Thick Liquid: Not tested   Puree Puree: Not tested   Solid     Solid: Not tested     Harlon Ditty, MA CCC-SLP  Acute Rehabilitation Services Pager [phone] Office [phone]  Claudine Mouton 05/24/2020,11:22  AM     

## 2020-05-24 NOTE — Progress Notes (Signed)
Occupational Therapy Treatment Patient Details Name: Jason [name] MRN: [id number] DOB: Feb 23, 1951 Today's Date: 05/24/2020    History of present illness 70 yo male presenting with AMS. 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 Pt s/p R frontal IVC placement, ETT 2/25-3/1. 3/5 pt reintubated and EVD removed.  He was extubated again on 3/8. Pt with recent admission after being found down at home with DKA. Other PMH includes: DM, HTN, CVA, CABG, afib with rvr, anxiety, and depression.   OT comments  Pt lethargic this am, but with maximal stimulation, he was able to follow intermittent simple one step commands.  He did visually fixate on me on his Lt inconsistently.   He requires total A for ADLs.   Follow Up Recommendations  SNF    Equipment Recommendations  None recommended by OT    Recommendations for Other Services      Precautions / Restrictions Precautions Precautions: Fall       Mobility Bed Mobility Overal bed mobility: Needs Assistance Bed Mobility: Rolling Rolling: Total assist              Transfers                 General transfer comment: unable    Balance       Sitting balance - Comments: Pt in chair position, and moved away from the back of the bed into "unsupported" position.  He required max A                                   ADL either performed or assessed with clinical judgement   ADL Overall ADL's : Needs assistance/impaired     Grooming: Wash/dry face;Bed level;Total assistance Grooming Details (indicate cue type and reason): hand over hand assist provided to wash face, but pt made no attempt to assist despite max cues                               General ADL Comments: pt unable to assist - total A     Vision       Perception     Praxis      Cognition Arousal/Alertness: Lethargic Behavior During Therapy: Flat affect Overall Cognitive Status: Impaired/Different  from baseline Area of Impairment: Attention;Following commands;Problem solving                   Current Attention Level: Focused   Following Commands: Follows one step commands inconsistently;Follows one step commands with increased time     Problem Solving: Decreased initiation;Slow processing;Difficulty sequencing;Requires verbal cues;Requires tactile cues General Comments: Pt lethargic. bed moved into chair position.  With max stimulation, pt held up 2 fingers on each hand, stuck out tongue, wiggled feet/toes, and nodded that he was tired.  he did visually fixate on me when noxious stimuli provided        Exercises Exercises: Other exercises Other Exercises Other Exercises: PROM bil. shoulders flex/ext, abd/add, elbow flex/ext, finger flex/ext, wrist flex/ext performed x 5 each UE   Shoulder Instructions       General Comments RR mid 20s - high 30s  All other VSS    Pertinent Vitals/ Pain       Pain Assessment: Faces Faces Pain Scale: Hurts a little bit Pain Location: uncertain Pain Descriptors / Indicators: Grimacing Pain Intervention(s): Monitored during session  Home  Living                                          Prior Functioning/Environment              Frequency  Min 2X/week        Progress Toward Goals  OT Goals(current goals can now be found in the care plan section)  Progress towards OT goals: Not progressing toward goals - comment (lethargy)     Plan Discharge plan remains appropriate    Co-evaluation                 AM-PAC OT "6 Clicks" Daily Activity     Outcome Measure   Help from another person eating meals?: Total Help from another person taking care of personal grooming?: Total Help from another person toileting, which includes using toliet, bedpan, or urinal?: Total Help from another person bathing (including washing, rinsing, drying)?: Total Help from another person to put on and taking off regular  upper body clothing?: Total Help from another person to put on and taking off regular lower body clothing?: Total 6 Click Score: 6    End of Session Equipment Utilized During Treatment: Oxygen  OT Visit Diagnosis: Unsteadiness on feet (R26.81);Other abnormalities of gait and mobility (R26.89);Muscle weakness (generalized) (M62.81)   Activity Tolerance Patient limited by lethargy   Patient Left in bed;with call bell/phone within reach;with bed alarm set   Nurse Communication Mobility status        Time: 7829-5621 OT Time Calculation (min): 27 min  Charges: OT General Charges $OT Visit: 1 Visit OT Treatments $Therapeutic Activity: 23-37 mins  Eber Jones., OTR/L Acute Rehabilitation Services Pager [phone] Office [phone]    Jeani Hawking M 05/24/2020, 10:47 AM

## 2020-05-25 DIAGNOSIS — I483 Typical atrial flutter: Secondary | ICD-10-CM | POA: Diagnosis not present

## 2020-05-25 DIAGNOSIS — I629 Nontraumatic intracranial hemorrhage, unspecified: Secondary | ICD-10-CM | POA: Diagnosis not present

## 2020-05-25 DIAGNOSIS — R400 Somnolence: Secondary | ICD-10-CM | POA: Diagnosis not present

## 2020-05-25 DIAGNOSIS — I615 Nontraumatic intracerebral hemorrhage, intraventricular: Secondary | ICD-10-CM | POA: Diagnosis not present

## 2020-05-25 LAB — BASIC METABOLIC PANEL
Anion gap: 6 (ref 5–15)
BUN: 27 mg/dL — ABNORMAL HIGH (ref 8–23)
CO2: 29 mmol/L (ref 22–32)
Calcium: 8.3 mg/dL — ABNORMAL LOW (ref 8.9–10.3)
Chloride: 116 mmol/L — ABNORMAL HIGH (ref 98–111)
Creatinine, Ser: 0.86 mg/dL (ref 0.61–1.24)
GFR, Estimated: >60 mL/min (ref >60)
Glucose, Bld: 169 mg/dL — ABNORMAL HIGH (ref 70–99)
Potassium: 3.8 mmol/L (ref 3.5–5.1)
Sodium: 151 mmol/L — ABNORMAL HIGH (ref 135–145)

## 2020-05-25 LAB — GLUCOSE, CAPILLARY
Glucose-Capillary: 137 mg/dL — ABNORMAL HIGH (ref 70–99)
Glucose-Capillary: 139 mg/dL — ABNORMAL HIGH (ref 70–99)
Glucose-Capillary: 170 mg/dL — ABNORMAL HIGH (ref 70–99)
Glucose-Capillary: 186 mg/dL — ABNORMAL HIGH (ref 70–99)
Glucose-Capillary: 193 mg/dL — ABNORMAL HIGH (ref 70–99)
Glucose-Capillary: 99 mg/dL (ref 70–99)

## 2020-05-25 LAB — HEPATIC FUNCTION PANEL
ALT: 90 U/L — ABNORMAL HIGH (ref 0–44)
AST: 83 U/L — ABNORMAL HIGH (ref 15–41)
Albumin: 2 g/dL — ABNORMAL LOW (ref 3.5–5.0)
Alkaline Phosphatase: 61 U/L (ref 38–126)
Bilirubin, Direct: <0.1 mg/dL (ref 0.0–0.2)
Total Bilirubin: 0.5 mg/dL (ref 0.3–1.2)
Total Protein: 6.2 g/dL — ABNORMAL LOW (ref 6.5–8.1)

## 2020-05-25 LAB — CBC
HCT: 42.6 % (ref 39.0–52.0)
Hemoglobin: 13.7 g/dL (ref 13.0–17.0)
MCH: 28.8 pg (ref 26.0–34.0)
MCHC: 32.2 g/dL (ref 30.0–36.0)
MCV: 89.7 fL (ref 80.0–100.0)
Platelets: 242 10*3/uL (ref 150–400)
RBC: 4.75 MIL/uL (ref 4.22–5.81)
RDW: 14.7 % (ref 11.5–15.5)
WBC: 9.5 10*3/uL (ref 4.0–10.5)
nRBC: 0 % (ref 0.0–0.2)

## 2020-05-25 MED ORDER — POTASSIUM CHLORIDE 20 MEQ PO PACK
40.0000 meq | PACK | Freq: Once | ORAL | Status: AC
  Administered 2020-05-25: 40 meq
  Filled 2020-05-25: qty 2

## 2020-05-25 MED ORDER — FREE WATER
200.0000 mL | Status: DC
  Administered 2020-05-25 – 2020-05-26 (×7): 200 mL

## 2020-05-25 MED ORDER — FREE WATER: 200.0000 mL | Freq: Four times a day (QID) | Status: DC

## 2020-05-25 MED ORDER — GERHARDT'S BUTT CREAM
TOPICAL_CREAM | CUTANEOUS | Status: DC | PRN
  Administered 2020-05-26: 1 via TOPICAL
  Filled 2020-05-25 (×2): qty 1

## 2020-05-25 NOTE — Progress Notes (Signed)
Occupational Therapy Treatment Patient Details Name: Jason [name] MRN: [id number] DOB: 02/26/1951 Today's Date: 05/25/2020    History of present illness 70 yo male presenting with AMS. 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 Pt s/p R frontal IVC placement, ETT 2/25-3/1. 3/5 pt reintubated and EVD removed.  He was extubated again on 3/8. Pt with recent admission after being found down at home with DKA. Other PMH includes: DM, HTN, CVA, CABG, afib with rvr, anxiety, and depression.   OT comments  Pt progressing towards established OT goals. Pt requiring Total A +2 for bed mobility with use of bed pad to transition hips towards EOB. Pt tolerating sitting at EOB for ~10 minutes with Mod A for posterior lean; fluctuating to Min and Max pending fatigue, pain, and awareness. Pt requiring Max-Total A +2 for sit<>stand at EOB with bil knees blocked. Continue to recommend dc to SNF and will continue to follow acutely as admitted.   RR up to 30s, HR 80-100s; BP stable   Follow Up Recommendations  SNF    Equipment Recommendations  None recommended by OT    Recommendations for Other Services PT consult    Precautions / Restrictions Precautions Precautions: Fall Restrictions Weight Bearing Restrictions: No       Mobility Bed Mobility Overal bed mobility: Needs Assistance Bed Mobility: Supine to Sit;Sit to Supine     Supine to sit: Total assist;+2 for physical assistance Sit to supine: Total assist;+2 for physical assistance   General bed mobility comments: Pt with min-no activation with bed mobility, needing physical assistance to place pt on R elbow to begin trunk descent with return to supine. TAx2 for trunk and leg management.    Transfers Overall transfer level: Needs assistance Equipment used: 2 person hand held assist Transfers: Sit to/from Stand Sit to Stand: Max assist;+2 physical assistance;+2 safety/equipment;Total assist          General transfer comment: Max A +2 for power up from EOB with bilateral knees blocked and use of bed pad into standing. Second sit<>Stand total A +2.    Balance Overall balance assessment: Needs assistance Sitting-balance support: Bilateral upper extremity supported;Feet supported Sitting balance-Leahy Scale: Poor Sitting balance - Comments: Majority of time requiring Mod A for posterior lean. Flucuating to Min-Max A pending pain and positioning for anterior lean onto elbows. Postural control: Posterior lean;Right lateral lean Standing balance support: Bilateral upper extremity supported Standing balance-Leahy Scale: Zero Standing balance comment: Max - Total A+2 for static standing for up to ~15 seconds EOB.                           ADL either performed or assessed with clinical judgement   ADL Overall ADL's : Needs assistance/impaired                                     Functional mobility during ADLs: Maximal assistance;Total assistance;+2 for physical assistance;+2 for safety/equipment (sit<>Stand from EOB) General ADL Comments: Max-Total A for ADLs and  bed mobility.     Vision       Perception     Praxis      Cognition Arousal/Alertness: Lethargic Behavior During Therapy: Flat affect Overall Cognitive Status: Impaired/Different from baseline Area of Impairment: Attention;Problem solving;Memory;Safety/judgement;Following commands;Awareness                 Orientation Level: Disoriented to;Time;Situation  Current Attention Level: Sustained Memory: Decreased recall of precautions;Decreased short-term memory Following Commands: Follows one step commands inconsistently;Follows one step commands with increased time Safety/Judgement: Decreased awareness of safety;Decreased awareness of deficits Awareness: Intellectual Problem Solving: Decreased initiation;Slow processing;Difficulty sequencing;Requires verbal cues;Requires tactile  cues General Comments: Pt saying ouch with movement of RUE/RLE. Pt stating one "aweful" in response to the questions "how are you doing?" Pt following simple commands with increased time; inconsistent. Requiring increased time and cues for making eye contact with tendency for R gaze        Exercises Exercises: Other exercises Other Exercises Other Exercises: Lateral leans L then R. Min A for posterior lean in L lateral lean. Max A for posterior lean to R. x2 each Other Exercises: Cervical PROM; manual to traps as pt with tendency for head turn and tilt to R.   Shoulder Instructions       General Comments RR up to 30s, HR 80-100s; noted mild separation at R shoulder    Pertinent Vitals/ Pain       Pain Assessment: Faces Faces Pain Scale: Hurts even more Pain Location: appears to be R side of body with mobility Pain Descriptors / Indicators: Grimacing;Moaning Pain Intervention(s): Monitored during session;Limited activity within patient's tolerance;Repositioned  Home Living                                          Prior Functioning/Environment              Frequency  Min 2X/week        Progress Toward Goals  OT Goals(current goals can now be found in the care plan section)  Progress towards OT goals: Progressing toward goals  Acute Rehab OT Goals Patient Stated Goal: none stated OT Goal Formulation: Patient unable to participate in goal setting Time For Goal Achievement: 05/29/20 Potential to Achieve Goals: Good ADL Goals Pt Will Perform Grooming: bed level;sitting;with mod assist Additional ADL Goal #1: Pt will follow one step commands during ADLs with Mod cues Additional ADL Goal #2: Pt will demonstrate sustained attention during ADLs with Mod cues  Plan Discharge plan remains appropriate    Co-evaluation    PT/OT/SLP Co-Evaluation/Treatment: Yes Reason for Co-Treatment: For patient/therapist safety;To address functional/ADL  transfers;Necessary to address cognition/behavior during functional activity PT goals addressed during session: Mobility/safety with mobility;Balance OT goals addressed during session: ADL's and self-care      AM-PAC OT "6 Clicks" Daily Activity     Outcome Measure   Help from another person eating meals?: Total Help from another person taking care of personal grooming?: Total Help from another person toileting, which includes using toliet, bedpan, or urinal?: Total Help from another person bathing (including washing, rinsing, drying)?: Total Help from another person to put on and taking off regular upper body clothing?: Total Help from another person to put on and taking off regular lower body clothing?: Total 6 Click Score: 6    End of Session    OT Visit Diagnosis: Unsteadiness on feet (R26.81);Other abnormalities of gait and mobility (R26.89);Muscle weakness (generalized) (M62.81)   Activity Tolerance Patient tolerated treatment well   Patient Left in bed;with call bell/phone within reach;with bed alarm set   Nurse Communication Mobility status        Time: 9563-8756 OT Time Calculation (min): 25 min  Charges: OT General Charges $OT Visit: 1 Visit OT Treatments $Therapeutic Activity: 8-22  mins  Jahnya Trindade MSOT, OTR/L Acute Rehab Pager: [phone] Office: [phone]   Theodoro Grist Lanee Chain 05/25/2020, 9:16 AM

## 2020-05-25 NOTE — Progress Notes (Signed)
NAME:  Jason [name], MRN:  [id number], DOB:  27-Jun-1950, LOS: 14 ADMISSION DATE:  05/11/2020, CONSULTATION DATE:  2/25 REFERRING MD:  Dr. Iver Nestle (Neurology) CHIEF COMPLAINT:  AMS  Brief History:  70 year old male admitted with ICH (s/p KCentra in the setting of Eliquis) and concern for hydrocephalus; ultimately requiring intubation for airway protection. EVD placed by Neurosurgery 2/25 (out 3/5).  Past Medical History:  CAD, T2DM, HTN, PAF (on Eliquis), CVA  Significant Hospital Events:  2/24 Admitted for ICH, KCentra given in the setting of Eliquis 2/25 Intubated, EVD placed 3/1 Extubated 3/2 EVD clamped, however worsening mental status so reopened 3/5 Reintubated, EVD removed 3/7 Attempted vent wean, limited by tachypnea 3/8 Extubated, tachypneic 3/9 BiPAP trial 3/10 Did no require BIPAP overnight   Consults:  Neurosurgery, PCCM  Procedures:  ETT 2/25 >> 3/1, 3/5 >> 3/8 EVD 2/25 >> 3/5  Significant Diagnostic Tests:   CT head 2/24 > 1.2cm thalamic hemorrhage with intraventricular extension. Chronic right corona radiata lacunar insult.   CT head 2/25 > Medial left thalamic intraparenchymal hemorrhage appears the same, approximately 1 cm in size. Extension into the ventricular system with the amount of intraventricular blood being very similar. Ventriculomegaly appears the same without further dilatation.  Echocardiogram 05/12/20 estimation, is 45 to 50%.The left ventricle has no  regional wall motion abnormalities.  Micro Data:  Blood 2/24 >> no growth final Urine 2/24 >> no  growth final Resp Cx 3/2 >> normal flora Blood Cx 3/4 >> no growth  Sputum 3/4 >> Few GPC, rare GVR >> rare normal flora  Antimicrobials:  Cefazolin 2/24 >> 3/1 Rocephin 3/1 >> 3/2 Zosyn 3/3 >> 3/8 Vanc 3/5 >> 3/8  Interim History / Subjective:  Less interactive this AM No acute events overnight  Remains on RA Will open eyes to stimuli and seen with increased RR with aroused   Objective    Blood pressure 131/60, pulse 75, temperature 98.6 F (37 C), temperature source Axillary, resp. rate (!) 22, height 5\' 10"  (1.778 m), weight 93.9 kg, SpO2 99 %.        Intake/Output Summary (Last 24 hours) at 05/25/2020 0810 Last data filed at 05/25/2020 0700 Gross per 24 hour  Intake 1835 ml  Output 1576 ml  Net 259 ml   Filed Weights   05/23/20 0443 05/24/20 0500 05/25/20 0443  Weight: 96.1 kg 95.1 kg 93.9 kg   Physical Exam: General: Acute on chronic ill appearing elderly male lying in bed, in NAD HEENT: [place]/AT, MM pink/moist, PERRL, [place] in place  Neuro: Will open eyes to verbal stimuli, unable to follow simple commands CV: s1s2 regular rate and rhythm, no murmur, rubs, or gallops,  PULM:  Diminished bilaterally, no added breath sounds, tachypnea with stimulation  GI: soft, bowel sounds active in all 4 quadrants, tender to palpation of right upper quadrant, non-distended, tolerating TF Extremities: warm/dry, no edema  Skin: no rashes or lesions  Resolved Hospital Problem list   Hypokalemia   Assessment & Plan:   Intraparenchymal hemorrhage 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 Likely hypertensive in nature. Anticoagulated on Eliquis, KCentra given in ED.  -S/p EVD placement 2/25 (removed 3/5). EEG 3/6 demonstrating generalized slowing c/f moderate to severe diffuse encephalopathy. S/p Keppra load. P: Management per neurology  Maintain neuro protective measures; goal for eurothermia, euglycemia, eunatermia, normoxia, and PCO2 goal of 35-40 Nutrition and bowel regiment  Seizure precautions  AEDs per neurology  Aspirations precautions   Hypertensive emergency -SBP on arrival  to [facility] ED 205/120 P: Continue Lopressor, Diltiazem, and Benazepril  SBP goal < 160 PRN IV antihypertensives   Acute respiratory failure with hypoxia requiring MV - resolved  - Extubated 3/8 for the second time  Possibly aspiration pneumonia at  admission -Initially extubated 3/1 with a few episodes of vomiting post extubation, unfortunately reintubated 3/5PM. Remains intermittently febrile with left lower lobe infiltrate. P: Aggressive pulmonary hygiene Mobilize as able  Wean supplemental oxygen as able  Resp culture with no growth  Remains at risk for reintubation, monitor respiratory status closely in ICU  Acute abdominal pain -Noted on exam 3/8, patient endorses pain/winces when BLQ are palpated. P: Bowel regiment   Sphenoid sinusitis  -Noted incidentally on CT scan. Vanc/Zosyn discontinued 3/8 in the setting of hypernatremia. P: Trend CBC and fever curve  Supportive care   Atrial fibrillation alternating with A- flutter on Eliquis CAD Chronic HFrEF, EF 45% P: Cardiology seen and assisted with management, have since signed off Continue diltiazem and lopressor for rate control  Continuous telemetry  Prophylactic anticoagulation only given ICH Resume statin at discharge  Optimize electrolytes   Hypernatremia -Uptrending Na noted 3/7-3/8. Likely multifactorial: TFs, Zosyn, fluid shifts. Zosyn discontinued 3/8 P: Continue free water flushes Trend Bmet  History of T2DM Hypoglycemia -Recent Hgb A1C 9.4. 3/6-3/8, patient was noted to have hypoglycemic episodes with lows in the 50s-70s. P: Continue Lantus, SSI and TF coverage  CBG q4hrs CBG goal 140-180  QTc prolongation P: Avoid QTc prolonging meds  Repeat EKG in AM   Physical deconditioning At risk for malnutrition P: Continue TF Continue with PT/OT/SLP efforts   Best practice (evaluated daily)  Diet: NPO, TFs Pain/Anxiety/Delirium protocol (if indicated): N/A VAP protocol (if indicated): N/A DVT prophylaxis: Lovenox GI prophylaxis: PPI Glucose control: SSI Mobility: Bedrest while intubated Disposition: ICU  Goals of Care:  Per primary   Ethne Jeon NP spoke with son over the phone morning of 3/10, he was updated regarding Mr. Wadding current  plan of care and projected trajector. We discussed the possibility of yet another respiratory decompensation and if this occurred the need for a tracheostomy would be very likely. At this time [name], son, indicated his father would want to remain a full code with all aggressive measures needed. Son states he will try and visit this within the next few days.  Critical care time: 35 minutes    Performed by: Delfin Gant  Total critical care time: 34 minutes  Critical care time was exclusive of separately billable procedures and treating other patients.  Critical care was necessary to treat or prevent imminent or life-threatening deterioration.  Critical care was time spent personally by me on the following activities: development of treatment plan with patient and/or surrogate as well as nursing, discussions with consultants, evaluation of patient's response to treatment, examination of patient, obtaining history from patient or surrogate, ordering and performing treatments and interventions, ordering and review of laboratory studies, ordering and review of radiographic studies, pulse oximetry and re-evaluation of patient's condition.  Delfin Gant, NP-C [facility] Personal contact information can be found on Amion  If no response please page: Adult pulmonary and critical care medicine pager on Amion unitl 7pm After 7pm please call [phone] 05/25/2020, 8:31 AM

## 2020-05-25 NOTE — Progress Notes (Signed)
STROKE TEAM PROGRESS NOTE   SUBJECTIVE (INTERVAL HISTORY) Extubated 3/8 1027  Remains drowsy/lethargic. Intermittently follows commands.  CBC    Component Value Date/Time   WBC 9.5 05/25/2020 0313   RBC 4.75 05/25/2020 0313   HGB 13.7 05/25/2020 0313   HCT 42.6 05/25/2020 0313   PLT 242 05/25/2020 0313   MCV 89.7 05/25/2020 0313   MCH 28.8 05/25/2020 0313   MCHC 32.2 05/25/2020 0313   RDW 14.7 05/25/2020 0313   LYMPHSABS 1.0 05/18/2020 0920   MONOABS 1.3 (H) 05/18/2020 0920   EOSABS 0.0 05/18/2020 0920   BASOSABS 0.1 05/18/2020 0920   BMP Latest Ref Rng & Units 05/25/2020 05/24/2020 05/24/2020  Glucose 70 - 99 mg/dL 169(H) - 140(H)  BUN 8 - 23 mg/dL 27(H) - 27(H)  Creatinine 0.61 - 1.24 mg/dL 0.86 - 0.82  Sodium 135 - 145 mmol/L 151(H) 151(H) 148(H)  Potassium 3.5 - 5.1 mmol/L 3.8 3.6 3.6  Chloride 98 - 111 mmol/L 116(H) - 112(H)  CO2 22 - 32 mmol/L 29 - 25  Calcium 8.9 - 10.3 mg/dL 8.3(L) - 8.3(L)    IMAGING past 24 hours EEG adult  Result Date: 05/14/2020  IMPRESSION: This study is suggestive of moderate to evere diffuse encephalopathy, nonspecific etiology. No seizures or epileptiform discharges were seen throughout the recording.  PHYSICAL EXAM Temp:  [98 F (36.7 C)-99.1 F (37.3 C)] 98.4 F (36.9 C) (03/10 0800) Pulse Rate:  [48-98] 75 (03/10 0700) Resp:  [15-43] 22 (03/10 0700) BP: (113-149)/(60-86) 131/60 (03/10 0700) SpO2:  [96 %-100 %] 99 % (03/10 0700) Weight:  [93.9 kg] 93.9 kg (03/10 0443) General: Comfortably laying in bed and sleeping. HEENT: Normocephalic atraumatic Cardiovascular: Irregularly irregular Respiratory: Scattered rales with mildly increased effort of breathing Abdomen: Mildly tender, less in comparison to the past couple of days. Extremities warm well perfused with evidence of longstanding vascular disease and multiple surgeries/amputations. Neurological exam Remains extubated Sleeping Very difficult to arouse Open eyes Initially in  the morning did not really follow any commands but in the evening would give me a thumbs up on his right hand. Difficult to assess gait is getting Was not able to verbalize his name today Inconsistent blink to threat from both sides  MED LIST  Current Facility-Administered Medications:  .  0.9 %  sodium chloride infusion, 250 mL, Intravenous, Continuous, Clark, Laura P, DO .  acetaminophen (TYLENOL) tablet 650 mg, 650 mg, Oral, Q4H PRN **OR** acetaminophen (TYLENOL) 160 MG/5ML solution 650 mg, 650 mg, Per Tube, Q4H PRN, 650 mg at 05/24/20 1025 **OR** acetaminophen (TYLENOL) suppository 650 mg, 650 mg, Rectal, Q4H PRN, Bhagat, Srishti L, MD, 650 mg at 05/17/20 1441 .  atorvastatin (LIPITOR) tablet 40 mg, 40 mg, Per NG tube, q1800, Olivencia-Simmons, Ivelisse, NP, 40 mg at 05/24/20 1834 .  chlorhexidine gluconate (MEDLINE KIT) (PERIDEX) 0.12 % solution 15 mL, 15 mL, Mouth Rinse, BID, Noemi Chapel P, DO, 15 mL at 05/25/20 0815 .  Chlorhexidine Gluconate Cloth 2 % PADS 6 each, 6 each, Topical, Daily, Bhagat, Srishti L, MD, 6 each at 05/24/20 1700 .  diltiazem (CARDIZEM) tablet 90 mg, 90 mg, Per Tube, Q6H, Lestine Mount, PA-C, 90 mg at 05/25/20 0610 .  enoxaparin (LOVENOX) injection 40 mg, 40 mg, Subcutaneous, Daily, Garvin Fila, MD, 40 mg at 05/25/20 0904 .  feeding supplement (JEVITY 1.5 CAL/FIBER) liquid 1,000 mL, 1,000 mL, Per Tube, Continuous, Garvin Fila, MD, Last Rate: 55 mL/hr at 05/25/20 0845, 1,000 mL at 05/25/20 0845 .  feeding supplement (PROSource TF) liquid 45 mL, 45 mL, Per Tube, TID, Spero Geralds, MD, 45 mL at 05/25/20 0905 .  free water 200 mL, 200 mL, Per Tube, Q4H, Chand, Sudham, MD, 200 mL at 05/25/20 0906 .  hydrALAZINE (APRESOLINE) injection 20 mg, 20 mg, Intravenous, Q6H PRN, Garvin Fila, MD, 20 mg at 05/19/20 1220 .  insulin aspart (novoLOG) injection 0-20 Units, 0-20 Units, Subcutaneous, Q4H, Magdalen Spatz, NP, 4 Units at 05/25/20 0900 .  insulin aspart  (novoLOG) injection 3 Units, 3 Units, Subcutaneous, Q4H, [place] Crane M, PA-C, 3 Units at 05/25/20 0900 .  insulin glargine (LANTUS) injection 20 Units, 20 Units, Subcutaneous, BID, Lestine Mount, [place], 20 Units at 05/25/20 2956 .  labetalol (NORMODYNE) injection 10 mg, 10 mg, Intravenous, Q2H PRN, Garvin Fila, MD, 10 mg at 05/24/20 0932 .  levETIRAcetam (KEPPRA) 100 MG/ML solution 750 mg, 750 mg, Per Tube, BID, Amie Portland, MD, 750 mg at 05/25/20 0905 .  MEDLINE mouth rinse, 15 mL, Mouth Rinse, q12n4p, Amie Portland, MD, 15 mL at 05/24/20 1700 .  metoprolol tartrate (LOPRESSOR) tablet 100 mg, 100 mg, Per Tube, TID, Merlene Laughter F, NP, 100 mg at 05/25/20 0904 .  ondansetron (ZOFRAN) injection 4 mg, 4 mg, Intravenous, Q6H PRN, Kipp Brood, MD, 4 mg at 05/19/20 1339 .  pantoprazole sodium (PROTONIX) 40 mg/20 mL oral suspension 40 mg, 40 mg, Per Tube, Daily, Julian Hy, DO, 40 mg at 05/25/20 0905 .  polyethylene glycol (MIRALAX / GLYCOLAX) packet 17 g, 17 g, Per Tube, Daily, Corey Harold, NP, 17 g at 05/24/20 1026 .  potassium chloride (KLOR-CON) packet 40 mEq, 40 mEq, Per Tube, Once, Jacky Kindle, MD .  senna-docusate (Senokot-S) tablet 1 tablet, 1 tablet, Per Tube, BID, Rosalin Hawking, MD, 1 tablet at 05/25/20 0905 .  thiamine tablet 100 mg, 100 mg, Per Tube, Daily, Bailey-Modzik, Delila A, NP, 100 mg at 05/25/20 2130   ASSESSMENT/PLAN [name] is a 70 y.o. male with history of HTN, DM, CAD/MI, afib on eliquis presenting with AMS, garbled speech, N/V and behavior change.   ICH - left thalamic ICH with IVH, likely due to HTN in the setting of eliquis use  CT left thalamic ICH with IVH, no hydrocephalus  Repeat CT head 2/25 stable hematoma and IVH   Repeat CT head 2/26 Medial left thalamic intraparenchymal hemorrhage appearssimilar in size measuring approximately 1 cm on series 19, image 3,with increased volume of intraventricular blood extending from the area of  hemorrhage. Including filling both the third ventricle with layering blood in the fourth and lateral ventricle  Repeat CT head 05/21/2020-done for concern for rightward gaze-with no new changes. CTA head and neck with no LVO. Thalamic ICH is resolving and blood seen layered in the posterior horns of lateral ventricles.  2D Echo EF 45-50%, No shunt  LDL 84  HgbA1c 9.4  VTE prophylaxis - SCDs, Lovenox $RemoveBefor'40mg'[id number]$  daily  Eliquis (apixaban) daily prior to admission, now on No antithrombotic  Therapy recommendations:  SNF  Disposition:  Pending  Unclear of what his baseline functioning is.  According to the bedside RN, who spoke with someone at his facility, it seems like he is usually very slow to speak and talks less but at baseline, prior to this admission, was able to get in and out of chair by himself.  I have attempted to call the son today and also yesterday-with no success. My colleagues from critical care were able to  reach the son this morning, who is traveling out of town and has been asked to come to the hospital to have further discussions but that have yet to pan out.  IVH  S/p EVD  2/27 Neurosurgery instilled tPA into EVD  3/1 EVD weaned to 20 cm H20, ICPs stable  3/2 failed clamping trial  3/5 EVD accidentally dislodged. Reintubated.   CT head repeat 3/6 stable, repeat at night due to concern for gaze deviation also stable.   3/7NSG signed off.   Extubated 3/8. EEG showed no seizure    Hypertensive emergency  Home meds:  Lotensin, metoprolol  Stable on the low end  On Benazepril $RemoveBefor'40mg'[id number]$  daily, metoprolol $RemoveBeforeDEI'50mg'mrtiNhtXOWFspIjC$  bid, cardizem 60 Q6 . Long-term BP goal normotensive  Afib/Aflutter on [facility] with HF   On eliquis PTA  Reversed by Kcentra  No antithrombotics/anticoagulants due to [place]  Continue po cardizem and metoprolol for rate control  Cardiology consult appreciated: Rate control adequate.   Continue tele monitoring   EF 45%   Concern for seizure 3 nights ago.  No recurrence   EEG with no evidence of seizures.  Severe diffuse encephalopathy of nonspecific etiology.  Continue Keppra 750 twice daily   HYPERNATREMIA -Likely multifactorial including medicines like Zosyn.  Free water was added.  Trending BMP. -Remains hyponatremic with sodium 151.   ABDOMINAL TENDERNESS -Mild transaminitis.  Abdominal x-ray unremarkable. -If has more tenderness, will consider an ultrasound.  Appreciate CCM assistance.   Respiratory failure and LLL Pneumonia Fever - resolved  Extubated 3/1, vomited post extubation  Tachypnea re-intubated 3/5 and extubated 3/8  CCM on board and appreciated  Empiric antibiotics-completed 5 days of Zosyn.   WBC 12.2 (stable in last 3 days)  Tmax 99.2  Blood cultures 3/4 pending-no growth in 4 days  Sputum culture pending-ABUNDANT WBC PRESENT,BOTH PMN AND MONONUCLEAR  FEW SQUAMOUS EPITHELIAL CELLS PRESENT  FEW GRAM POSITIVE COCCI  RARE GRAM VARIABLE ROD   Appreciate CCM assistance with management  Dysphagia  Likely due to hemorrhagic stroke  Failed swallow eval  On TF @ 55  Speech on board  As waxing and waning mentation but I have yet to see him awake enough where he seems to be strong enough to be able to swallow without aspirating consistently.  Attempting to reach family that has been unsuccessful.  Hyperlipidemia  Home meds:  crestor 20  LDL 84, goal < 70  May continue statin at discharge  Diabetes type II Uncontrolled with hyperglycemia and episodes of hpoglycemia.  Home meds:  insulin  HgbA1c 9.4, goal < 7.0  SSI  Appreciate CCM assistance in management  Other Stroke Risk Factors  Advanced Age >/= 70   Hx stroke/TIA  Coronary artery disease and MI  Discussed with PCCM attending Dr. Tacy Learn, patient's bedside RN.  Hospital day #14  CRITICAL CARE ATTESTATION Performed by: Amie Portland, MD Total critical care time: 32 minutes Critical care time was exclusive of separately billable  procedures and treating other patients and/or supervising APPs/Residents/Students Critical care was necessary to treat or prevent imminent or life-threatening deterioration due to [place], IVH, resp failure This patient is critically ill and at significant risk for neurological worsening and/or death and care requires constant monitoring. Critical care was time spent personally by me on the following activities: development of treatment plan with patient and/or surrogate as well as nursing, discussions with consultants, evaluation of patient's response to treatment, examination of patient, obtaining history from patient or surrogate, ordering and performing treatments and interventions,  ordering and review of laboratory studies, ordering and review of radiographic studies, pulse oximetry, re-evaluation of patient's condition, participation in multidisciplinary rounds and medical decision making of high complexity in the care of this patient.   -- Amie Portland, MD Stroke Neurology Pager: [phone]   To contact Stroke Continuity provider, please refer to [email or web address] After hours, contact General Neurology

## 2020-05-25 NOTE — Progress Notes (Signed)
Physical Therapy Treatment Patient Details Name: Jason [name] MRN: [id number] DOB: February 21, 1951 Today's Date: 05/25/2020    History of Present Illness 70 yo male presenting with AMS. 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 Pt s/p R frontal IVC placement, ETT 2/25-3/1. 3/5 pt reintubated and EVD removed.  He was extubated again on 3/8. Pt with recent admission after being found down at home with DKA. Other PMH includes: DM, HTN, CVA, CABG, afib with rvr, anxiety, and depression.    PT Comments    Pt is making slow progress towards his goals, displaying continual lethargy and minimal activation with cues. Pt needing physical assistance to initiate all tasks. Encouraged midline balance in sitting and standing, needing modA majority of time with static sitting EOB and max-TAx2 to stand at EOB for a brief period with bil UE support this date. Pt displays pain and stiffness with mobility on his R side of his body, thus facilitated improved positioning of neck, shoulders, and legs. Will continue to follow acutely. Current recommendations remain appropriate.   Follow Up Recommendations  SNF     Equipment Recommendations  Hospital bed    Recommendations for Other Services       Precautions / Restrictions Precautions Precautions: Fall Restrictions Weight Bearing Restrictions: No    Mobility  Bed Mobility Overal bed mobility: Needs Assistance Bed Mobility: Supine to Sit;Sit to Supine     Supine to sit: Total assist;+2 for physical assistance Sit to supine: Total assist;+2 for physical assistance   General bed mobility comments: Pt with min-no activation with bed mobility, needing physical assistance to place pt on R elbow to begin trunk descent with return to supine. TAx2 for trunk and leg management.    Transfers Overall transfer level: Needs assistance Equipment used: 2 person hand held assist Transfers: Sit to/from Stand Sit to Stand: Max  assist;+2 physical assistance;+2 safety/equipment         General transfer comment: x2 sit to stand transfers from EOB with knee block and use of pad under buttocks as sling to assist in hip extension. MaxAx2 to come to stand first rep, but TAx2 with pt unable to obtain full upright standing position second rep.  Ambulation/Gait             General Gait Details: Pt very lethargic, unsafe to attempt at this time.   Stairs             Wheelchair Mobility    Modified Rankin (Stroke Patients Only) Modified Rankin (Stroke Patients Only) Pre-Morbid Rankin Score: Moderate disability Modified Rankin: Severe disability     Balance Overall balance assessment: Needs assistance Sitting-balance support: Bilateral upper extremity supported;Feet supported Sitting balance-Leahy Scale: Poor Sitting balance - Comments: When pt able to decrease his posterior lean he only required modA majority of time to maintain static sitting balance EOB. Wavered on occasion to needing min-maxA. Positioned pt 2x sitting leaning laterally onto either elbow, needing minA when leaning on L and maxA leaning on R due to posterior lean. Physical assistance required to initiate push up to midline from either elbow. Postural control: Posterior lean;Right lateral lean Standing balance support: Bilateral upper extremity supported Standing balance-Leahy Scale: Zero Standing balance comment: Max-TAx2 for static standing for up to ~15 seconds EOB.                            Cognition Arousal/Alertness: Lethargic Behavior During Therapy: Flat affect Overall Cognitive Status: Impaired/Different from  baseline Area of Impairment: Attention;Following commands;Problem solving                 Orientation Level: Disoriented to;Time;Situation Current Attention Level: Sustained   Following Commands: Follows one step commands inconsistently;Follows one step commands with increased time     Problem  Solving: Decreased initiation;Slow processing;Difficulty sequencing;Requires verbal cues;Requires tactile cues General Comments: Pt minimally verbal and very lethargic during session. Increased time to process and physical assist to initiate all tasks cued with multi-modal simple commands.      Exercises Other Exercises Other Exercises: PROM into L cervical lateral flexion and L cervical rotation x4 Other Exercises: PROM bil hips into internal rotation, x2 on L and x5 on R Other Exercises: PROM R hip and knee into flexion and then extension, x5 Other Exercises: PROM scapular adduction and shoulder retraction, x4    General Comments General comments (skin integrity, edema, etc.): RR up to 30s, HR 80-100s; noted mild separation at R shoulder      Pertinent Vitals/Pain Pain Assessment: Faces Faces Pain Scale: Hurts even more Pain Location: appears to be R side of body with mobility Pain Descriptors / Indicators: Grimacing;Moaning Pain Intervention(s): Limited activity within patient's tolerance;Monitored during session;Repositioned (RN aware)    Home Living                      Prior Function            PT Goals (current goals can now be found in the care plan section) Acute Rehab PT Goals Patient Stated Goal: none stated PT Goal Formulation: Patient unable to participate in goal setting Time For Goal Achievement: 05/29/20 Potential to Achieve Goals: Fair Progress towards PT goals: Progressing toward goals (slowly)    Frequency    Min 2X/week      PT Plan Current plan remains appropriate    Co-evaluation PT/OT/SLP Co-Evaluation/Treatment: Yes Reason for Co-Treatment: Necessary to address cognition/behavior during functional activity;For patient/therapist safety;To address functional/ADL transfers PT goals addressed during session: Mobility/safety with mobility;Balance        AM-PAC PT "6 Clicks" Mobility   Outcome Measure  Help needed turning from your  back to your side while in a flat bed without using bedrails?: Total Help needed moving from lying on your back to sitting on the side of a flat bed without using bedrails?: Total Help needed moving to and from a bed to a chair (including a wheelchair)?: Total Help needed standing up from a chair using your arms (e.g., wheelchair or bedside chair)?: Total Help needed to walk in hospital room?: Total Help needed climbing 3-5 steps with a railing? : Total 6 Click Score: 6    End of Session   Activity Tolerance: Patient limited by lethargy Patient left: in bed;with call bell/phone within reach;with bed alarm set;with SCD's reapplied;with restraints reapplied Nurse Communication: Mobility status;Other (comment) (pain on R side of body) PT Visit Diagnosis: Muscle weakness (generalized) (M62.81);Difficulty in walking, not elsewhere classified (R26.2);Unsteadiness on feet (R26.81)     Time: 4034-7425 PT Time Calculation (min) (ACUTE ONLY): 25 min  Charges:  $Neuromuscular Re-education: 8-22 mins                     Raymond Gurney, PT, DPT Acute Rehabilitation Services  Pager: [phone] Office: [phone]    Jewel Baize 05/25/2020, 8:30 AM

## 2020-05-26 DIAGNOSIS — T17908D Unspecified foreign body in respiratory tract, part unspecified causing other injury, subsequent encounter: Secondary | ICD-10-CM | POA: Diagnosis not present

## 2020-05-26 DIAGNOSIS — I615 Nontraumatic intracerebral hemorrhage, intraventricular: Secondary | ICD-10-CM | POA: Diagnosis not present

## 2020-05-26 DIAGNOSIS — I629 Nontraumatic intracranial hemorrhage, unspecified: Secondary | ICD-10-CM | POA: Diagnosis not present

## 2020-05-26 LAB — BASIC METABOLIC PANEL
Anion gap: 7 (ref 5–15)
BUN: 27 mg/dL — ABNORMAL HIGH (ref 8–23)
CO2: 26 mmol/L (ref 22–32)
Calcium: 8.2 mg/dL — ABNORMAL LOW (ref 8.9–10.3)
Chloride: 118 mmol/L — ABNORMAL HIGH (ref 98–111)
Creatinine, Ser: 0.82 mg/dL (ref 0.61–1.24)
GFR, Estimated: >60 mL/min (ref >60)
Glucose, Bld: 214 mg/dL — ABNORMAL HIGH (ref 70–99)
Potassium: 4.3 mmol/L (ref 3.5–5.1)
Sodium: 151 mmol/L — ABNORMAL HIGH (ref 135–145)

## 2020-05-26 LAB — GLUCOSE, CAPILLARY
Glucose-Capillary: 203 mg/dL — ABNORMAL HIGH (ref 70–99)
Glucose-Capillary: 157 mg/dL — ABNORMAL HIGH (ref 70–99)
Glucose-Capillary: 171 mg/dL — ABNORMAL HIGH (ref 70–99)
Glucose-Capillary: 129 mg/dL — ABNORMAL HIGH (ref 70–99)
Glucose-Capillary: 124 mg/dL — ABNORMAL HIGH (ref 70–99)
Glucose-Capillary: 152 mg/dL — ABNORMAL HIGH (ref 70–99)

## 2020-05-26 LAB — CBC
HCT: 44.8 % (ref 39.0–52.0)
Hemoglobin: 13.8 g/dL (ref 13.0–17.0)
MCH: 27.9 pg (ref 26.0–34.0)
MCHC: 30.8 g/dL (ref 30.0–36.0)
MCV: 90.5 fL (ref 80.0–100.0)
Platelets: 256 10*3/uL (ref 150–400)
RBC: 4.95 MIL/uL (ref 4.22–5.81)
RDW: 14.7 % (ref 11.5–15.5)
WBC: 8.3 10*3/uL (ref 4.0–10.5)
nRBC: 0 % (ref 0.0–0.2)

## 2020-05-26 MED ORDER — FREE WATER
200.0000 mL | Status: DC
  Administered 2020-05-26 – 2020-05-27 (×7): 200 mL

## 2020-05-26 NOTE — Progress Notes (Signed)
STROKE TEAM PROGRESS NOTE   SUBJECTIVE (INTERVAL HISTORY) No acute events.  Stable neuro exam.   Remains somnolent requiring stimulation and coaching for exam.  Unable to fully participate in ROS. Denies headache or other pain.  Hypernatremia persists  CBC    Component Value Date/Time   WBC 8.3 05/26/2020 0104   RBC 4.95 05/26/2020 0104   HGB 13.8 05/26/2020 0104   HCT 44.8 05/26/2020 0104   PLT 256 05/26/2020 0104   MCV 90.5 05/26/2020 0104   MCH 27.9 05/26/2020 0104   MCHC 30.8 05/26/2020 0104   RDW 14.7 05/26/2020 0104   LYMPHSABS 1.0 05/18/2020 0920   MONOABS 1.3 (H) 05/18/2020 0920   EOSABS 0.0 05/18/2020 0920   BASOSABS 0.1 05/18/2020 0920   BMP Latest Ref Rng & Units 05/26/2020 05/25/2020 05/24/2020  Glucose 70 - 99 mg/dL 214(H) 169(H) -  BUN 8 - 23 mg/dL 27(H) 27(H) -  Creatinine 0.61 - 1.24 mg/dL 0.82 0.86 -  Sodium 135 - 145 mmol/L 151(H) 151(H) 151(H)  Potassium 3.5 - 5.1 mmol/L 4.3 3.8 3.6  Chloride 98 - 111 mmol/L 118(H) 116(H) -  CO2 22 - 32 mmol/L 26 29 -  Calcium 8.9 - 10.3 mg/dL 8.2(L) 8.3(L) -    IMAGING past 24 hours EEG adult  Result Date: 05/14/2020  IMPRESSION: This study is suggestive of moderate to evere diffuse encephalopathy, nonspecific etiology. No seizures or epileptiform discharges were seen throughout the recording.  PHYSICAL EXAM Temp:  [98.4 F (36.9 C)-100.2 F (37.9 C)] 99 F (37.2 C) (03/11 0325) Pulse Rate:  [64-96] 96 (03/11 0600) Resp:  [10-39] 10 (03/11 0700) BP: (114-161)/(64-85) 135/67 (03/11 0700) SpO2:  [95 %-100 %] 99 % (03/11 0700) Weight:  [94.9 kg] 94.9 kg (03/11 0417) Essentially unchanged exam from yesterday-at different times as slightly different exam with differing levels of wakefulness. General: Comfortably laying in bed and sleeping. HEENT: Normocephalic atraumatic Cardiovascular: Irregularly irregular Respiratory: Scattered rales with mildly increased effort of breathing Abdomen: Mildly tender, less in  comparison to the past couple of days. Extremities warm well perfused with evidence of longstanding vascular disease and multiple surgeries/amputations. Neurological exam Remains extubated Sleeping Very difficult to arouse Open eyes Was able to give thumbs up after a lot of coaxing today. Difficult to assess gait is getting Was not able to verbalize his name today Inconsistent blink to threat from both sides  MED LIST  Current Facility-Administered Medications:  .  0.9 %  sodium chloride infusion, 250 mL, Intravenous, Continuous, Clark, Laura P, DO .  acetaminophen (TYLENOL) tablet 650 mg, 650 mg, Oral, Q4H PRN **OR** acetaminophen (TYLENOL) 160 MG/5ML solution 650 mg, 650 mg, Per Tube, Q4H PRN, 650 mg at 05/25/20 2108 **OR** acetaminophen (TYLENOL) suppository 650 mg, 650 mg, Rectal, Q4H PRN, Bhagat, Srishti L, MD, 650 mg at 05/17/20 1441 .  atorvastatin (LIPITOR) tablet 40 mg, 40 mg, Per NG tube, q1800, Olivencia-Simmons, Ivelisse, NP, 40 mg at 05/25/20 1836 .  chlorhexidine gluconate (MEDLINE KIT) (PERIDEX) 0.12 % solution 15 mL, 15 mL, Mouth Rinse, BID, Noemi Chapel P, DO, 15 mL at 05/25/20 2000 .  Chlorhexidine Gluconate Cloth 2 % PADS 6 each, 6 each, Topical, Daily, Bhagat, Srishti L, MD, 6 each at 05/25/20 1230 .  diltiazem (CARDIZEM) tablet 90 mg, 90 mg, Per Tube, Q6H, Lestine Mount, PA-C, 90 mg at 05/26/20 0603 .  enoxaparin (LOVENOX) injection 40 mg, 40 mg, Subcutaneous, Daily, Garvin Fila, MD, 40 mg at 05/25/20 0904 .  feeding supplement (JEVITY  1.5 CAL/FIBER) liquid 1,000 mL, 1,000 mL, Per Tube, Continuous, Garvin Fila, MD, Last Rate: 55 mL/hr at 05/25/20 0845, 1,000 mL at 05/25/20 0845 .  feeding supplement (PROSource TF) liquid 45 mL, 45 mL, Per Tube, TID, Spero Geralds, MD, 45 mL at 05/25/20 2233 .  free water 200 mL, 200 mL, Per Tube, Q4H, Chand, Sudham, MD, 200 mL at 05/26/20 0407 .  Gerhardt's butt cream, , Topical, PRN, Merlene Laughter F, NP .  hydrALAZINE  (APRESOLINE) injection 20 mg, 20 mg, Intravenous, Q6H PRN, Garvin Fila, MD, 20 mg at 05/19/20 1220 .  insulin aspart (novoLOG) injection 0-20 Units, 0-20 Units, Subcutaneous, Q4H, Magdalen Spatz, NP, 7 Units at 05/26/20 0407 .  insulin aspart (novoLOG) injection 3 Units, 3 Units, Subcutaneous, Q4H, Lestine Mount, [place], 3 Units at 05/26/20 0407 .  insulin glargine (LANTUS) injection 20 Units, 20 Units, Subcutaneous, BID, [place] Crane M, [place], 20 Units at 05/25/20 2240 .  labetalol (NORMODYNE) injection 10 mg, 10 mg, Intravenous, Q2H PRN, Garvin Fila, MD, 10 mg at 05/24/20 0932 .  levETIRAcetam (KEPPRA) 100 MG/ML solution 750 mg, 750 mg, Per Tube, BID, Amie Portland, MD, 750 mg at 05/25/20 2234 .  MEDLINE mouth rinse, 15 mL, Mouth Rinse, q12n4p, Amie Portland, MD, 15 mL at 05/25/20 1630 .  metoprolol tartrate (LOPRESSOR) tablet 100 mg, 100 mg, Per Tube, TID, Merlene Laughter F, NP, 100 mg at 05/25/20 2235 .  ondansetron (ZOFRAN) injection 4 mg, 4 mg, Intravenous, Q6H PRN, Kipp Brood, MD, 4 mg at 05/19/20 1339 .  pantoprazole sodium (PROTONIX) 40 mg/20 mL oral suspension 40 mg, 40 mg, Per Tube, Daily, Julian Hy, DO, 40 mg at 05/25/20 0905 .  polyethylene glycol (MIRALAX / GLYCOLAX) packet 17 g, 17 g, Per Tube, Daily, Corey Harold, NP, 17 g at 05/24/20 1026 .  senna-docusate (Senokot-S) tablet 1 tablet, 1 tablet, Per Tube, BID, Rosalin Hawking, MD, 1 tablet at 05/25/20 0905 .  thiamine tablet 100 mg, 100 mg, Per Tube, Daily, Bailey-Modzik, Delila A, NP, 100 mg at 05/25/20 9892   ASSESSMENT/PLAN [name] is a 70 y.o. male with history of HTN, DM, CAD/MI, afib on eliquis presenting with AMS, garbled speech, N/V and behavior change.   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 Including filling both the third ventricle with layering blood in the fourth and lateral ventricle  Repeat CT head 05/21/2020-done for concern for rightward gaze-with no new changes. CTA head and neck with no LVO. Thalamic ICH is resolving and blood seen layered in the posterior horns of lateral ventricles.  2D Echo EF 45-50%, No shunt  LDL 84  HgbA1c 9.4  VTE prophylaxis - SCDs, Lovenox $RemoveBefor'40mg'[id number]$  daily  Eliquis (apixaban) daily prior to admission, now on No antithrombotic  Therapy recommendations:  SNF  Disposition:  SNF pending medical readiness  Son has been difficult to reach (?truckdriver). Has been asked by CCM to come in for further discussions but has not yet happened. He was called today 3/11 with voicemail left.   His facility staff reported his baseline functioning was very slow to speak, able  to independently transfer to chair.   IVH  S/p EVD  2/27 Neurosurgery instilled tPA into EVD  3/1 EVD weaned to 20 cm H20, ICPs stable  3/2 failed clamping trial  3/5 EVD accidentally dislodged. Reintubated.   CT head repeat 3/6 stable, repeat at night due to concern for gaze deviation also stable.   3/7NSG signed off.   Extubated 3/8. EEG showed no seizure    Hypertensive emergency  Home meds:  Lotensin, metoprolol  Stable on the low end  On Benazepril $RemoveBefor'40mg'tEmWElLLRyRL$  daily, metoprolol $RemoveBeforeDEI'50mg'twmseQQxhgvahnIi$  bid, cardizem 60 Q6 . 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   Continue tele monitoring   EF 45%   Concern for seizure 3 nights ago. No recurrence   EEG with no evidence of seizures.  Severe diffuse  encephalopathy of nonspecific etiology.  Continue Keppra 750 twice daily   HYPERNATREMIA -Likely multifactorial including medicines like Zosyn.  Zosyn discontinued 3/8. Free water was added.  Trending BMP. -Remains hyponatremic with sodium 148->151->151->151-->151 -Continue free water-appreciate CCM management.  ABDOMINAL TENDERNESS -Mild transaminitis.  Abdominal x-ray unremarkable. -If has more tenderness, will consider an ultrasound.  Appreciate CCM assistance.  Respiratory failure and LLL Pneumonia/sinusitis Fever - resolved  Extubated 3/1, vomited post extubation  Tachypnea re-intubated 3/5 and extubated 3/8  CCM on board and appreciated  Empiric antibiotics-completed 5 days of Zosyn.   WBC 8.2  (stable in last 4 days)  Tmax 100.2  Blood cultures 3/4 pending-no growth in 4 days  Appreciate CCM assistance with management  Dysphagia  Likely due to hemorrhagic stroke  Failed swallow eval  On TF @ 74  Speech on board  Waxing and waning mentation has been prohibitive for safe swallowing  High aspiration risk   Attempting to reach family has been unsuccessful. Left  voicemail with son today.   SLP unable to assess today due to somnolence.  Hyperlipidemia  Home meds:  crestor 20  LDL 84, goal < 70  May continue statin at discharge  Diabetes type II Uncontrolled with hyperglycemia and episodes of hpoglycemia.  Home meds:  insulin  HgbA1c 9.4, goal < 7.0  SSI  Appreciate CCM assistance in management  Other Stroke Risk Factors  Advanced Age >/= 71   Hx stroke/TIA  Coronary artery disease and MI  PCCM to assume primary.  Discussed with Dr. Calvert Cantor day #15    Attending Neurohospitalist Addendum Patient seen and examined with APP/Resident. Agree with the history and physical as documented above. Agree with the plan as documented, which I helped formulate. I have independently reviewed the chart, obtained history, review of systems and  examined the patient.I have personally reviewed pertinent head/neck/spine imaging (CT/MRI). Please feel free to call with any questions. -- Amie Portland, MD Stroke Neurology Pager: [phone]   CRITICAL CARE ATTESTATION Performed by: Amie Portland, MD Total critical care time: 49minutes Critical care time was exclusive of separately billable procedures and treating other patients and/or supervising APPs/Residents/Students Critical care was necessary to treat or prevent imminent or life-threatening deterioration due to [place], IVH, respiratory failure, dysphagia, high risk of recurrent stroke/bleed. This patient is critically ill and at significant risk for neurological worsening and/or death and care requires constant monitoring. Critical care was time spent personally by me on the following activities: development of treatment plan with  patient and/or surrogate as well as nursing, discussions with consultants, evaluation of patient's response to treatment, examination of patient, obtaining history from patient or surrogate, ordering and performing treatments and interventions, ordering and review of laboratory studies, ordering and review of radiographic studies, pulse oximetry, re-evaluation of patient's condition, participation in multidisciplinary rounds and medical decision making of high complexity in the care of this patient.

## 2020-05-26 NOTE — Progress Notes (Signed)
SLP Cancellation Note  Patient Details Name: Jason [name] MRN: [id number] DOB: Oct 22, 1950   Cancelled treatment: Did not rouse to sternal rub; not appropriate for PO trials this morning.  Continue cortrak and NPO. SLP will f/u early next week for re-assessment.  Areyanna Figeroa L. Samson Frederic, MA CCC/SLP Acute Rehabilitation Services Office number [phone] Pager [phone]           Blenda Mounts Laurice 05/26/2020, 10:23 AM

## 2020-05-26 NOTE — Progress Notes (Signed)
NAME:  Jason [name], MRN:  [id number], DOB:  07-09-1950, LOS: 15 ADMISSION DATE:  05/11/2020, CONSULTATION DATE:  2/25 REFERRING MD:  Dr. Iver Nestle (Neurology) CHIEF COMPLAINT:  AMS  Brief History:  70 year old male admitted with ICH (s/p KCentra in the setting of Eliquis) and concern for hydrocephalus; ultimately requiring intubation for airway protection. EVD placed by Neurosurgery 2/25 (out 3/5).  Past Medical History:  CAD, T2DM, HTN, PAF (on Eliquis), CVA  Significant Hospital Events:  2/24 Admitted for ICH, KCentra given in the setting of Eliquis 2/25 Intubated, EVD placed 3/1 Extubated 3/2 EVD clamped, however worsening mental status so reopened 3/5 Reintubated, EVD removed 3/7 Attempted vent wean, limited by tachypnea 3/8 Extubated, tachypneic 3/9 BiPAP trial 3/10 Did no require BIPAP overnight   Consults:  Neurosurgery, PCCM  Procedures:  ETT 2/25 >> 3/1, 3/5 >> 3/8 EVD 2/25 >> 3/5  Significant Diagnostic Tests:   CT head 2/24 > 1.2cm thalamic hemorrhage with intraventricular extension. Chronic right corona radiata lacunar insult.   CT head 2/25 > Medial left thalamic intraparenchymal hemorrhage appears the same, approximately 1 cm in size. Extension into the ventricular system with the amount of intraventricular blood being very similar. Ventriculomegaly appears the same without further dilatation.  Echocardiogram 05/12/20 estimation, is 45 to 50%.The left ventricle has no  regional wall motion abnormalities.  Micro Data:  Blood 2/24 >> no growth final Urine 2/24 >> no  growth final Resp Cx 3/2 >> normal flora Blood Cx 3/4 >> no growth  Sputum 3/4 >> Few GPC, rare GVR >> rare normal flora  Antimicrobials:  Cefazolin 2/24 >> 3/1 Rocephin 3/1 >> 3/2 Zosyn 3/3 >> 3/8 Vanc 3/5 >> 3/8  Interim History / Subjective:  No acute events overnight  Remains on RA  Objective   Blood pressure 135/67, pulse 96, temperature 99 F (37.2 C), temperature source Axillary,  resp. rate 10, height 5\' 10"  (1.778 m), weight 94.9 kg, SpO2 99 %.        Intake/Output Summary (Last 24 hours) at 05/26/2020 0815 Last data filed at 05/26/2020 0700 Gross per 24 hour  Intake 2705 ml  Output 1475 ml  Net 1230 ml   Filed Weights   05/24/20 0500 05/25/20 0443 05/26/20 0417  Weight: 95.1 kg 93.9 kg 94.9 kg   Physical Exam: General: Chronically ill appearing elderly male lying in bed, in NAD HEENT: [place]/AT, MM pink/moist, PERRL,  Neuro: Will open eyes to physical stimuli, unwilling to follow commands this am  CV: s1s2 regular rate and rhythm, no murmur, rubs, or gallops,  PULM:  Clear to ascultation bilaterally, no added breath sounds GI: soft, bowel sounds active in all 4 quadrants, non-tender, non-distended, tolerating TF Extremities: warm/dry, no edema  Skin: no rashes or lesions  Resolved Hospital Problem list   Hypokalemia  Acute respiratory failure with hypoxia requiring MV - resolved  - Extubated 3/8 for the second time  Likely aspiration pneumonia on admission -S/P couple rounds of antibiotics as above Sphenoid sinusitis  -Noted incidentally on CT scan. Vanc/Zosyn discontinued 3/8 in the setting of hypernatremia. QTc prolongation Hypertensive emergency POA  Assessment & Plan:   Intraparenchymal hemorrhage 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 Likely hypertensive in nature. Anticoagulated on Eliquis, KCentra given in ED.  -S/p EVD placement 2/25 (removed 3/5). EEG 3/6 demonstrating generalized slowing c/f moderate to severe diffuse encephalopathy. S/p Keppra load. P: Management per neurology  Maintain neuro protective measures; goal for eurothermia, euglycemia, eunatermia, normoxia Nutrition and bowel regiment  Seizure precautions  AEDs per neurology  Aspirations precautions   Physical deconditioning At risk for malnutrition P: Continue TF Continue PT/OT/SLP efforts  Encourage pulmonary hygiene  HOB  elevated   HX of HTN  -SBP on arrival to [facility] ED 205/120 P: Continue Lopressor, Diltiazem and Benazepril  SBP goal < 160 PRN IV antihypertensives   Acute abdominal pain -Noted on exam 3/8, patient endorses pain/winces when BLQ are palpated. -Per SNF report patient chronically endorses generalized pain  P: Supportive care Maintain bowel regiment   Atrial fibrillation alternating with A- flutter on Eliquis CAD Chronic HFrEF, EF 45% P: Cardiology seen and assisted with management, have since signed off Continue diltiazem and lopressor for rate control  Continuous telemetry  Prophylactic anticoagulation only given ICH Resume statin at discharge  Optimize electrolytes   Hypernatremia -Uptrending Na noted 3/7-3/8. Likely multifactorial: TFs, Zosyn, fluid shifts. Zosyn discontinued 3/8 P: Continue free water  Trend Bmet  Hold offending agents   History of T2DM Hypoglycemia -Recent Hgb A1C 9.4. 3/6-3/8, patient was noted to have hypoglycemic episodes with lows in the 50s-70s. P: Continue Lantus, SSI, and TF coverage  CBG q4hrs   Stage 2 pressure ulcer  -Patient was seen with stage 2 medial buttock pressure ulcer 3/9 P: Nutrition support as above Pressure alleviating devices Mobilize as able   Best practice (evaluated daily)  Diet: NPO, TFs Pain/Anxiety/Delirium protocol (if indicated): N/A VAP protocol (if indicated): N/A DVT prophylaxis: Lovenox GI prophylaxis: PPI Glucose control: SSI Mobility: Bedrest while intubated Disposition: ICU  Goals of Care:  Per primary   Marguis Mathieson NP spoke with son over the phone morning of 3/10, he was updated regarding [name] current plan of care and projected trajector. We discussed the possibility of yet another respiratory decompensation and if this occurred the need for a tracheostomy would be very likely. At this time [name], son, indicated his father would want to remain a full code with all aggressive measures needed. Son  states he will try and visit this within the next few days.  Critical care time: 35 minutes    Performed by: Delfin Gant  Total critical care time: 32 minutes  Critical care time was exclusive of separately billable procedures and treating other patients.  Critical care was necessary to treat or prevent imminent or life-threatening deterioration.  Critical care was time spent personally by me on the following activities: development of treatment plan with patient and/or surrogate as well as nursing, discussions with consultants, evaluation of patient's response to treatment, examination of patient, obtaining history from patient or surrogate, ordering and performing treatments and interventions, ordering and review of laboratory studies, ordering and review of radiographic studies, pulse oximetry and re-evaluation of patient's condition.  Delfin Gant, NP-C [facility] Personal contact information can be found on Amion  If no response please page: Adult pulmonary and critical care medicine pager on Amion unitl 7pm After 7pm please call [phone] 05/26/2020, 8:15 AM

## 2020-05-27 ENCOUNTER — Inpatient Hospital Stay (HOSPITAL_COMMUNITY): Payer: Medicare HMO

## 2020-05-27 DIAGNOSIS — E1165 Type 2 diabetes mellitus with hyperglycemia: Secondary | ICD-10-CM

## 2020-05-27 DIAGNOSIS — Z794 Long term (current) use of insulin: Secondary | ICD-10-CM

## 2020-05-27 DIAGNOSIS — I4819 Other persistent atrial fibrillation: Secondary | ICD-10-CM | POA: Diagnosis not present

## 2020-05-27 DIAGNOSIS — I615 Nontraumatic intracerebral hemorrhage, intraventricular: Secondary | ICD-10-CM | POA: Diagnosis not present

## 2020-05-27 DIAGNOSIS — I5022 Chronic systolic (congestive) heart failure: Secondary | ICD-10-CM

## 2020-05-27 DIAGNOSIS — E870 Hyperosmolality and hypernatremia: Secondary | ICD-10-CM | POA: Diagnosis not present

## 2020-05-27 DIAGNOSIS — Z789 Other specified health status: Secondary | ICD-10-CM

## 2020-05-27 DIAGNOSIS — R400 Somnolence: Secondary | ICD-10-CM | POA: Diagnosis not present

## 2020-05-27 LAB — BASIC METABOLIC PANEL
Anion gap: 7 (ref 5–15)
BUN: 26 mg/dL — ABNORMAL HIGH (ref 8–23)
CO2: 28 mmol/L (ref 22–32)
Calcium: 8.4 mg/dL — ABNORMAL LOW (ref 8.9–10.3)
Chloride: 118 mmol/L — ABNORMAL HIGH (ref 98–111)
Creatinine, Ser: 0.8 mg/dL (ref 0.61–1.24)
GFR, Estimated: >60 mL/min (ref >60)
Glucose, Bld: 97 mg/dL (ref 70–99)
Potassium: 3.8 mmol/L (ref 3.5–5.1)
Sodium: 153 mmol/L — ABNORMAL HIGH (ref 135–145)

## 2020-05-27 LAB — GLUCOSE, CAPILLARY
Glucose-Capillary: 122 mg/dL — ABNORMAL HIGH (ref 70–99)
Glucose-Capillary: 99 mg/dL (ref 70–99)
Glucose-Capillary: 132 mg/dL — ABNORMAL HIGH (ref 70–99)
Glucose-Capillary: 151 mg/dL — ABNORMAL HIGH (ref 70–99)
Glucose-Capillary: 145 mg/dL — ABNORMAL HIGH (ref 70–99)

## 2020-05-27 MED ORDER — FREE WATER
250.0000 mL | Status: DC
  Administered 2020-05-27 – 2020-05-30 (×24): 250 mL

## 2020-05-27 MED ORDER — HYDRALAZINE HCL 25 MG PO TABS
25.0000 mg | ORAL_TABLET | Freq: Three times a day (TID) | ORAL | Status: DC
  Administered 2020-05-27 – 2020-05-31 (×13): 25 mg
  Filled 2020-05-27 (×13): qty 1

## 2020-05-27 NOTE — Progress Notes (Signed)
Patient with orders for transfer to 6E Report given to [facility]. VSS

## 2020-05-27 NOTE — Progress Notes (Signed)
Occupational Therapy Treatment Patient Details Name: Jason [name] MRN: [id number] DOB: [birth year] Today's Date: 05/27/2020    History of present illness 70 yo male presenting with AMS. 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 Pt s/p R frontal IVC placement, ETT 2/25-3/1. 3/5 pt reintubated and EVD removed.  He was extubated again on 3/8. Pt with recent admission after being found down at home with DKA. Other PMH includes: DM, HTN, CVA, CABG, afib with rvr, anxiety, and depression.   OT comments  Pt with response to pain with neck PROM that increased arousal for a few seconds but unable to sustain. Pt does not follow simple commands. Pt with mouth breathing and does cloth mouth with face washing. Pt total dependence in all care at this time. Recommendation SNF .    Follow Up Recommendations  SNF    Equipment Recommendations  Wheelchair cushion (measurements OT);Wheelchair (measurements OT);Hospital bed;Other (comment) (air mattress overlay)    Recommendations for Other Services      Precautions / Restrictions Precautions Precautions: Fall Precaution Comments: mitten L hand       Mobility Bed Mobility Overal bed mobility: Needs Assistance             General bed mobility comments: dependent in all aspects this session    Transfers                 General transfer comment: Not appropriate at this time    Balance     Sitting balance-Leahy Scale: Zero Sitting balance - Comments: requires pillow to position to midline                                   ADL either performed or assessed with clinical judgement   ADL Overall ADL's : Needs assistance/impaired Eating/Feeding: NPO   Grooming: Wash/dry face;Total assistance;Bed level Grooming Details (indicate cue type and reason): pt with hand over hand to reach for wash cloth and due to contact with R hand pt does initiate pincher grasp and pulls off face due to  contact. pt does not follow command to grasp wash cloth or to hand to therapist Upper Body Bathing: Total assistance   Lower Body Bathing: Total assistance                         General ADL Comments: bed level due to arousal with bed in full chair position. Pt with pillows used to help position into midline. pt with arousal to neck PROM stretch. pt with eye opening to name call and sternal contact.     Vision       Perception     Praxis      Cognition Arousal/Alertness: Lethargic Behavior During Therapy: Flat affect Overall Cognitive Status: Difficult to assess Area of Impairment: Attention                   Current Attention Level:  (aroused)   Following Commands:  (not following)       General Comments: pt with no verbalizations, pt with a few grunts only. pt moving L UE to name call in a swat as if startled. Pt moving R UE with neck rotation stretching. No purposeful movement. Pt with R hand grasping anything that it contacts        Exercises Other Exercises Other Exercises: PROM neck in all planes Other Exercises: PROM R UE /  L UE-- noted to have  L UE clonus with elbow extension   Shoulder Instructions       General Comments restraint mitten L hand  bed alarm pt with mouth breathing and [place] 2L    Pertinent Vitals/ Pain       Pain Assessment: No/denies pain  Home Living                                          Prior Functioning/Environment              Frequency  Min 2X/week        Progress Toward Goals  OT Goals(current goals can now be found in the care plan section)  Progress towards OT goals: Not progressing toward goals - comment  Acute Rehab OT Goals Patient Stated Goal: none stated OT Goal Formulation: Patient unable to participate in goal setting Time For Goal Achievement: 06/10/20 Potential to Achieve Goals: Fair ADL Goals Pt Will Perform Grooming: with max assist;sitting;bed level Additional  ADL Goal #1: Pt will follow one step commands during ADLs with Mod cues Additional ADL Goal #2: Pt will demonstrate focused attention during ADLs with Mod cues  Plan Discharge plan remains appropriate    Co-evaluation                 AM-PAC OT "6 Clicks" Daily Activity     Outcome Measure   Help from another person eating meals?: Total Help from another person taking care of personal grooming?: Total Help from another person toileting, which includes using toliet, bedpan, or urinal?: Total Help from another person bathing (including washing, rinsing, drying)?: Total Help from another person to put on and taking off regular upper body clothing?: Total Help from another person to put on and taking off regular lower body clothing?: Total 6 Click Score: 6    End of Session Equipment Utilized During Treatment: Oxygen  OT Visit Diagnosis: Unsteadiness on feet (R26.81);Other abnormalities of gait and mobility (R26.89);Muscle weakness (generalized) (M62.81)   Activity Tolerance Patient tolerated treatment well   Patient Left in bed;with call bell/phone within reach;with bed alarm set;with restraints reapplied;with SCD's reapplied   Nurse Communication Mobility status        Time: 1425-1440 OT Time Calculation (min): 15 min  Charges: OT General Charges $OT Visit: 1 Visit OT Treatments $Therapeutic Activity: 8-22 mins   Brynn, OTR/L  Acute Rehabilitation Services Pager: [phone] Office: [phone] .    Mateo Flow 05/27/2020, 3:26 PM

## 2020-05-27 NOTE — Progress Notes (Signed)
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  He received Kcentra for Eliquis reversal.  He was intubated and had external ventricular drain placed on 05/12/2020.  He was extubated on 05/16/2020.  EVD dislodged on 05/20/2020.  He was reintubated on 3/5, and eventually extubated to BiPAP on 3/8.  He was transferred to [facility] service on 3/12 on room air but persistent encephalopathy  Subjective: Seen and examined earlier this morning.  No major events overnight of this morning.  Patient remains somnolent, only moans to noxious stimuli.  He seems to have tachypnea with shallow breaths.   Objective: Vitals:   05/27/20 0500 05/27/20 0600 05/27/20 0700 05/27/20 0800  BP: (!) 143/87 (!) 153/70 (!) 149/68   Pulse: 66 (!) 48 (!) 47   Resp: (!) 35 (!) 23 (!) 35   Temp:    98.8 F (37.1 C)  TempSrc:    Axillary  SpO2: 100% 100% 99%   Weight:      Height:        Intake/Output Summary (Last 24 hours) at 05/27/2020 1103 Last data filed at 05/27/2020 0700 Gross per 24 hour  Intake 2685 ml  Output 1700 ml  Net 985 ml   Filed Weights   05/25/20 0443 05/26/20 0417 05/27/20 0414  Weight: 93.9 kg 94.9 kg 93.6 kg    Examination:  GENERAL: Chronically ill-appearing. HEENT: Oral mucosa dry.  PERRL. Cortrak in place NECK: Supple.  No apparent JVD.  RESP: On RA.  Some tachypnea and work of breathing.  Shallow breaths. CVS: Irregular rhythm.  Normal rate. Heart sounds normal.  ABD/GI/GU: BS+. Abd soft, NTND.  MSK/EXT:  No apparent deformity but limited exam.  Trace dependent edema's SKIN: no apparent skin lesion or wound NEURO: Somnolent.  Moans in response to  noxious stimuli.  PERRL.  No facial asymmetry.  Patellar reflex symmetric.  No apparent focal neuro deficit but limited exam due to mental status. PSYCH: Calm.  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  Only moans to noxious stimuli -In the setting of hypertensive emergency (BP 205/120 on admit) and anticoagulation for A. Fib -Received KCentra for Eliquis reversal in ED -External ventricular drain from 2/25-3/5.  -EEG with generalized slowing consistent with moderate to severe diffuse encephalopathy -On Keppra for seizure prophylaxis -On tube feed via cortrak  -Neurology following. -Therapy recommended SNF  Acute respiratory failure with hypoxia in the setting of the above: ntubation and mechanical ventilation from 2/25-3/8.  Currently on room air.  However, seems to have tachypnea with shallow breaths and some work of breathing.  He is high risk for reintubation -Aspiration precautions and pulmonary hygiene  Persistent A. fib: Rate controlled.  Was on Eliquis prior to admission -Continue holding Eliquis -Continue Cardizem and Lopressor per tube -No  anticoagulation in the setting of ICH -Optimize K and Mg  Chronic systolic CHF: Stable.  LVEF 45 to 50%.  He has some dependent edema but no overt fluid overload. -Monitor fluid status and respiratory status -Not on diuretics.  Hypernatremia: Na 153 -Increase free water to 250 cc every 3 hours. -Recheck in the morning  Hypertensive emergency/uncontrolled hypertension: BP 205/120 on admit.  Improved -Continue Cardizem 90 mg every 6  hours -Continue metoprolol tartrate 100 mg 3 times daily -Schedule hydralazine at 25 mg every 8 hours -Continue as needed labetalol with parameters  History of CAD: Stable -Cardiac meds as above.  Uncontrolled IDDM-2: A1c 9.4% Recent Labs  Lab 05/26/20 1945 05/26/20 2312 05/27/20 0315 05/27/20 0803 05/27/20 1131  GLUCAP 124* 152* 122* 99 132*  -Continue current insulin regimen  -Continue statin   Physical deconditioning -Continue PT/OT/SLP efforts   Goal of care: Significant comorbidity as above.  His prognosis is concerning.  Still full code.  Attempted to call patient's son for further discussion but no answer. -Agree with palliative care consult per neurology  Nutrition Body mass index is 29.61 kg/m. Nutrition Problem: Inadequate oral intake Etiology: inability to eat Signs/Symptoms: NPO status Interventions: Tube feeding,Prostat   Pressure skin injury: Stage II Pressure Injury 05/24/20 Buttocks Bilateral;Medial Stage 2 -  Partial thickness loss of dermis presenting as a shallow open injury with a red, pink wound bed without slough. (Active)  05/24/20 0800  Location: Buttocks  Location Orientation: Bilateral;Medial  Staging: Stage 2 -  Partial thickness loss of dermis presenting as a shallow open injury with a red, pink wound bed without slough.  Wound Description (Comments):   Present on Admission:    DVT prophylaxis:  enoxaparin (LOVENOX) injection 40 mg Start: 05/15/20 1130 SCD's Start: 05/11/20 2335  Code Status: Full code Family Communication: Patient and/or RN.  Attempted to call patient's son for update but no answer. Level of care: Telemetry Cardiac Status is: Inpatient  Remains inpatient appropriate because:Hemodynamically unstable, Persistent severe electrolyte disturbances, Altered mental status, Unsafe d/c plan, IV treatments appropriate due to intensity of illness or inability to take PO and Inpatient level of care appropriate due to severity of  illness   Dispo: The patient is from: Home              Anticipated d/c is to: SNF              Patient currently is not medically stable to d/c.   Difficult to place patient No       Consultants:  Neurosurgery Intensivist Cardiology Neurology Palliative medicine   Sch Meds:  Scheduled Meds: . atorvastatin  40 mg Per NG tube q1800  . chlorhexidine gluconate (MEDLINE KIT)  15 mL Mouth Rinse BID  . Chlorhexidine Gluconate Cloth  6 each Topical Daily  . diltiazem  90 mg Per Tube Q6H  . enoxaparin (LOVENOX) injection  40 mg Subcutaneous Daily  . feeding supplement (PROSource TF)  45 mL Per Tube TID  . free water  250 mL Per Tube Q3H  . hydrALAZINE  25 mg Per Tube Q8H  . insulin aspart  0-20 Units Subcutaneous Q4H  . insulin aspart  3 Units Subcutaneous Q4H  . insulin glargine  20 Units Subcutaneous BID  . levETIRAcetam  750 mg Per Tube BID  . mouth rinse  15 mL Mouth Rinse q12n4p  . metoprolol tartrate  100 mg Per Tube TID  . pantoprazole sodium  40 mg Per Tube Daily  . polyethylene  glycol  17 g Per Tube Daily  . senna-docusate  1 tablet Per Tube BID  . thiamine  100 mg Per Tube Daily   Continuous Infusions: . sodium chloride    . feeding supplement (JEVITY 1.5 CAL/FIBER) 1,000 mL (05/27/20 0614)   PRN Meds:.acetaminophen **OR** acetaminophen (TYLENOL) oral liquid 160 mg/5 mL **OR** acetaminophen, Gerhardt's butt cream, labetalol, ondansetron (ZOFRAN) IV  Antimicrobials: Anti-infectives (From admission, onward)   Start     Dose/Rate Route Frequency Ordered Stop   05/21/20 1100  vancomycin (VANCOREADY) IVPB 1750 mg/350 mL  Status:  Discontinued        1,750 mg 175 mL/hr over 120 Minutes Intravenous Every 24 hours 05/20/20 0953 05/23/20 1046   05/20/20 1045  vancomycin (VANCOREADY) IVPB 2000 mg/400 mL        2,000 mg 200 mL/hr over 120 Minutes Intravenous  Once 05/20/20 0948 05/20/20 1341   05/19/20 1015  cefTRIAXone (ROCEPHIN) 2 g in sodium chloride 0.9 % 100 mL  IVPB  Status:  Discontinued        2 g 200 mL/hr over 30 Minutes Intravenous Every 24 hours 05/18/20 0958 05/18/20 1132   05/18/20 1400  piperacillin-tazobactam (ZOSYN) IVPB 3.375 g  Status:  Discontinued        3.375 g 12.5 mL/hr over 240 Minutes Intravenous Every 8 hours 05/18/20 1132 05/23/20 1046   05/17/20 1015  cefTRIAXone (ROCEPHIN) 1 g in sodium chloride 0.9 % 100 mL IVPB  Status:  Discontinued        1 g 200 mL/hr over 30 Minutes Intravenous Every 24 hours 05/17/20 0918 05/18/20 0958   05/12/20 1530  ceFAZolin (ANCEF) IVPB 1 g/50 mL premix  Status:  Discontinued        1 g 100 mL/hr over 30 Minutes Intravenous Every 8 hours 05/12/20 1440 05/17/20 0918       I have personally reviewed the following labs and images: CBC: Recent Labs  Lab 05/22/20 0603 05/23/20 0333 05/24/20 0327 05/24/20 1200 05/25/20 0313 05/26/20 0104  WBC 12.4* 12.0* 12.2*  --  9.5 8.3  HGB 13.9 13.7 14.5 12.9* 13.7 13.8  HCT 44.6 44.4 46.2 38.0* 42.6 44.8  MCV 89.4 90.4 90.4  --  89.7 90.5  PLT 194 188 211  --  242 256   BMP &GFR Recent Labs  Lab 05/22/20 1251 05/23/20 0333 05/24/20 0327 05/24/20 1200 05/25/20 0313 05/26/20 0104 05/27/20 0407  NA  --  148* 148* 151* 151* 151* 153*  K  --  3.4* 3.6 3.6 3.8 4.3 3.8  CL  --  110 112*  --  116* 118* 118*  CO2  --  26 25  --  $R'29 26 28  'jq$ GLUCOSE  --  71 140*  --  169* 214* 97  BUN  --  28* 27*  --  27* 27* 26*  CREATININE  --  0.88 0.82  --  0.86 0.82 0.80  CALCIUM  --  8.2* 8.3*  --  8.3* 8.2* 8.4*  MG 2.4  --   --   --   --   --   --    Estimated Creatinine Clearance: 97.3 mL/min (by C-G formula based on SCr of 0.8 mg/dL). Liver & Pancreas: Recent Labs  Lab 05/24/20 0327 05/25/20 0313  AST 72* 83*  ALT 57* 90*  ALKPHOS 63 61  BILITOT 0.5 0.5  PROT 6.5 6.2*  ALBUMIN 2.2* 2.0*   No results for input(s): LIPASE, AMYLASE in the last 168 hours. No  results for input(s): AMMONIA in the last 168 hours. Diabetic: No results for  input(s): HGBA1C in the last 72 hours. Recent Labs  Lab 05/26/20 1553 05/26/20 1945 05/26/20 2312 05/27/20 0315 05/27/20 0803  GLUCAP 129* 124* 152* 122* 99   Cardiac Enzymes: No results for input(s): CKTOTAL, CKMB, CKMBINDEX, TROPONINI in the last 168 hours. No results for input(s): PROBNP in the last 8760 hours. Coagulation Profile: No results for input(s): INR, PROTIME in the last 168 hours. Thyroid Function Tests: No results for input(s): TSH, T4TOTAL, FREET4, T3FREE, THYROIDAB in the last 72 hours. Lipid Profile: No results for input(s): CHOL, HDL, LDLCALC, TRIG, CHOLHDL, LDLDIRECT in the last 72 hours. Anemia Panel: No results for input(s): VITAMINB12, FOLATE, FERRITIN, TIBC, IRON, RETICCTPCT in the last 72 hours. Urine analysis:    Component Value Date/Time   COLORURINE YELLOW 05/11/2020 2100   APPEARANCEUR HAZY (A) 05/11/2020 2100   LABSPEC 1.011 05/11/2020 2100   PHURINE 7.0 05/11/2020 2100   GLUCOSEU 50 (A) 05/11/2020 2100   HGBUR LARGE (A) 05/11/2020 2100   BILIRUBINUR NEGATIVE 05/11/2020 2100   KETONESUR 20 (A) 05/11/2020 2100   PROTEINUR 100 (A) 05/11/2020 2100   NITRITE NEGATIVE 05/11/2020 2100   LEUKOCYTESUR MODERATE (A) 05/11/2020 2100   Sepsis Labs: Invalid input(s): PROCALCITONIN, [place]  Microbiology: Recent Results (from the past 240 hour(s))  Expectorated Sputum Assessment w Gram Stain, Rflx to Resp Cult     Status: None   Collection Time: 05/17/20 11:29 PM   Specimen: Sputum  Result Value Ref Range Status   Specimen Description SPUTUM  Final   Special Requests NONE  Final   Sputum evaluation   Final    THIS SPECIMEN IS ACCEPTABLE FOR SPUTUM CULTURE Performed at [facility], [address] [place], [place] [address]    Report Status 05/18/2020 FINAL  Final  Culture, Respiratory w Gram Stain     Status: None   Collection Time: 05/17/20 11:29 PM   Specimen: SPU  Result Value Ref Range Status   Specimen Description SPUTUM  Final    Special Requests NONE Reflexed from [id number]  Final   Gram Stain   Final    FEW WBC PRESENT, PREDOMINANTLY PMN FEW GRAM NEGATIVE RODS FEW GRAM POSITIVE COCCI IN PAIRS IN CLUSTERS RARE GRAM POSITIVE RODS    Culture   Final    FEW Normal respiratory flora-no Staph aureus or Pseudomonas seen Performed at [facility], [address], [place], [place] [address]    Report Status 05/20/2020 FINAL  Final  Culture, blood (Routine X 2) w Reflex to ID Panel     Status: None   Collection Time: 05/19/20 12:44 AM   Specimen: BLOOD  Result Value Ref Range Status   Specimen Description BLOOD RIGHT ARM  Final   Special Requests   Final    BOTTLES DRAWN AEROBIC AND ANAEROBIC Blood Culture adequate volume   Culture   Final    NO GROWTH 5 DAYS Performed at [facility], [place] [address], [place],  [address]    Report Status 05/24/2020 FINAL  Final  Culture, blood (Routine X 2) w Reflex to ID Panel     Status: None   Collection Time: 05/19/20 12:59 AM   Specimen: BLOOD RIGHT HAND  Result Value Ref Range Status   Specimen Description BLOOD RIGHT HAND  Final   Special Requests   Final    BOTTLES DRAWN AEROBIC AND ANAEROBIC Blood Culture results may not be optimal  due to an inadequate volume of blood received in culture bottles   Culture   Final    NO GROWTH 5 DAYS Performed at [facility], [place] [address] [place], [place] [address]    Report Status 05/24/2020 FINAL  Final  Expectorated Sputum Assessment w Gram Stain, Rflx to Resp Cult     Status: None   Collection Time: 05/19/20  6:30 AM   Specimen: Expectorated Sputum  Result Value Ref Range Status   Specimen Description EXPECTORATED SPUTUM  Final   Special Requests Normal  Final   Sputum evaluation   Final    THIS SPECIMEN IS ACCEPTABLE FOR SPUTUM CULTURE Performed at [facility], [place] [address] [place], [place] [address]    Report Status 05/22/2020 FINAL  Final  Culture, Respiratory w Gram Stain      Status: None   Collection Time: 05/19/20  6:30 AM  Result Value Ref Range Status   Specimen Description EXPECTORATED SPUTUM  Final   Special Requests Normal Reflexed from [id number]  Final   Gram Stain   Final    ABUNDANT WBC PRESENT,BOTH PMN AND MONONUCLEAR FEW SQUAMOUS EPITHELIAL CELLS PRESENT FEW GRAM POSITIVE COCCI RARE GRAM VARIABLE ROD    Culture   Final    RARE Normal respiratory flora-no Staph aureus or Pseudomonas seen Performed at [facility], [place] [address] [place], [place] [address]    Report Status 05/21/2020 FINAL  Final    Radiology Studies: No results found.    Katelee Schupp T. [place]  If 7PM-7AM, please contact night-coverage [email or web address] 05/27/2020, 11:03 AM

## 2020-05-27 NOTE — Progress Notes (Signed)
STROKE TEAM PROGRESS NOTE   SUBJECTIVE (INTERVAL HISTORY) Patient transferred to PCCM primary service yesterday.  No acute events.  Stable neuro exam.   Mild increased work of breathing noted this morning and discussed with critical care team.  Remains somnolent requiring stimulation and coaching for exam. Unable to fully participate in ROS. Denies headache or other pain.  Hypernatremia persists with Na 153 Extensive discussion with son at bedside regarding patient's lack of progress and high risk for complications including but not limited to aspiration, need for re-intubation and likely need for PEG placement. We discussed quality of life, patient's low functioning baseline status (SNF x 2 months PTA). Son asked patient to blink his responses regarding feeding tube or re-intubation. Explained patient's mental status is unreliable for decision making.  Questions answered. At this time son wishes to proceed with full treatment.   CBC    Component Value Date/Time   WBC 8.3 05/26/2020 0104   RBC 4.95 05/26/2020 0104   HGB 13.8 05/26/2020 0104   HCT 44.8 05/26/2020 0104   PLT 256 05/26/2020 0104   MCV 90.5 05/26/2020 0104   MCH 27.9 05/26/2020 0104   MCHC 30.8 05/26/2020 0104   RDW 14.7 05/26/2020 0104   LYMPHSABS 1.0 05/18/2020 0920   MONOABS 1.3 (H) 05/18/2020 0920   EOSABS 0.0 05/18/2020 0920   BASOSABS 0.1 05/18/2020 0920   BMP Latest Ref Rng & Units 05/27/2020 05/26/2020 05/25/2020  Glucose 70 - 99 mg/dL 97 214(H) 169(H)  BUN 8 - 23 mg/dL 26(H) 27(H) 27(H)  Creatinine 0.61 - 1.24 mg/dL 0.80 0.82 0.86  Sodium 135 - 145 mmol/L 153(H) 151(H) 151(H)  Potassium 3.5 - 5.1 mmol/L 3.8 4.3 3.8  Chloride 98 - 111 mmol/L 118(H) 118(H) 116(H)  CO2 22 - 32 mmol/L _0 Calcium 8.9 - 10.3 mg/dL 8.4(L) 8.2(L) 8.3(L)    IMAGING past 24 hours EEG adult  Result Date: 05/14/2020  IMPRESSION: This study is suggestive of moderate to evere diffuse encephalopathy, nonspecific etiology. No  seizures or epileptiform discharges were seen throughout the recording.  PHYSICAL EXAM Temp:  [98.1 F (36.7 C)-100.3 F (37.9 C)] 99.5 F (37.5 C) (03/12 0317) Pulse Rate:  [47-72] 47 (03/12 0700) Resp:  [6-42] 35 (03/12 0700) BP: (126-155)/(65-111) 149/68 (03/12 0700) SpO2:  [98 %-100 %] 99 % (03/12 0700) Weight:  [93.6 kg] 93.6 kg (03/12 0414) Essentially unchanged exam from yesterday-at different times as slightly different exam with differing levels of wakefulness. General: Comfortably laying in bed and sleeping. HEENT: Normocephalic atraumatic Cardiovascular: Irregularly irregular Respiratory: Scattered rales with mildly increased effort of breathing Abdomen: Mildly tender, less in comparison to the past couple of days. Extremities warm well perfused with evidence of longstanding vascular disease and multiple surgeries/amputations. Neurological exam Remains extubated Sleeping Very difficult to arouse Open eyes Was able to give thumbs up after a lot of coaxing today. Difficult to assess gait is getting Was not able to verbalize his name today Inconsistent blink to threat from both sides  MED LIST  Current Facility-Administered Medications:  .  0.9 %  sodium chloride infusion, 250 mL, Intravenous, Continuous, Clark, Laura P, DO .  acetaminophen (TYLENOL) tablet 650 mg, 650 mg, Oral, Q4H PRN **OR** acetaminophen (TYLENOL) 160 MG/5ML solution 650 mg, 650 mg, Per Tube, Q4H PRN, 650 mg at 05/27/20 0055 **OR** acetaminophen (TYLENOL) suppository 650 mg, 650 mg, Rectal, Q4H PRN, Bhagat, Srishti L, MD, 650 mg at 05/17/20 1441 .  atorvastatin (LIPITOR) tablet 40 mg, 40 mg, Per  NG tube, q1800, Olivencia-Simmons, Ivelisse, NP, 40 mg at 05/26/20 1743 .  chlorhexidine gluconate (MEDLINE KIT) (PERIDEX) 0.12 % solution 15 mL, 15 mL, Mouth Rinse, BID, Noemi Chapel P, DO, 15 mL at 05/26/20 2000 .  Chlorhexidine Gluconate Cloth 2 % PADS 6 each, 6 each, Topical, Daily, Bhagat, Srishti L, MD, 6  each at 05/26/20 1400 .  diltiazem (CARDIZEM) tablet 90 mg, 90 mg, Per Tube, Q6H, Lestine Mount, PA-C, 90 mg at 05/27/20 0615 .  enoxaparin (LOVENOX) injection 40 mg, 40 mg, Subcutaneous, Daily, Garvin Fila, MD, 40 mg at 05/26/20 0927 .  feeding supplement (JEVITY 1.5 CAL/FIBER) liquid 1,000 mL, 1,000 mL, Per Tube, Continuous, Garvin Fila, MD, Last Rate: 55 mL/hr at 05/27/20 0614, 1,000 mL at 05/27/20 0614 .  feeding supplement (PROSource TF) liquid 45 mL, 45 mL, Per Tube, TID, Spero Geralds, MD, 45 mL at 05/26/20 2123 .  free water 250 mL, 250 mL, Per Tube, Q3H, Wendee Beavers T, MD .  Gerhardt's butt cream, , Topical, PRN, Merlene Laughter F, NP, 1 application at 70/62/37 1437 .  hydrALAZINE (APRESOLINE) tablet 25 mg, 25 mg, Per Tube, Q8H, Gonfa, Taye T, MD .  insulin aspart (novoLOG) injection 0-20 Units, 0-20 Units, Subcutaneous, Q4H, Magdalen Spatz, NP, 3 Units at 05/27/20 0411 .  insulin aspart (novoLOG) injection 3 Units, 3 Units, Subcutaneous, Q4H, Lestine Mount, [place], 3 Units at 05/27/20 [phone]  insulin glargine (LANTUS) injection 20 Units, 20 Units, Subcutaneous, BID, [place] Crane M, [place], 20 Units at 05/26/20 2118 .  labetalol (NORMODYNE) injection 10 mg, 10 mg, Intravenous, Q2H PRN, Garvin Fila, MD, 10 mg at 05/24/20 0932 .  levETIRAcetam (KEPPRA) 100 MG/ML solution 750 mg, 750 mg, Per Tube, BID, Amie Portland, MD, 750 mg at 05/26/20 2124 .  MEDLINE mouth rinse, 15 mL, Mouth Rinse, q12n4p, Amie Portland, MD, 15 mL at 05/26/20 1558 .  metoprolol tartrate (LOPRESSOR) tablet 100 mg, 100 mg, Per Tube, TID, Merlene Laughter F, NP, 100 mg at 05/26/20 2125 .  ondansetron (ZOFRAN) injection 4 mg, 4 mg, Intravenous, Q6H PRN, Kipp Brood, MD, 4 mg at 05/19/20 1339 .  pantoprazole sodium (PROTONIX) 40 mg/20 mL oral suspension 40 mg, 40 mg, Per Tube, Daily, Julian Hy, DO, 40 mg at 05/26/20 0927 .  polyethylene glycol (MIRALAX / GLYCOLAX) packet 17 g, 17 g, Per Tube,  Daily, Corey Harold, NP, 17 g at 05/24/20 1026 .  senna-docusate (Senokot-S) tablet 1 tablet, 1 tablet, Per Tube, BID, Rosalin Hawking, MD, 1 tablet at 05/25/20 0905 .  thiamine tablet 100 mg, 100 mg, Per Tube, Daily, Bailey-Modzik, Delila A, NP, 100 mg at 05/26/20 1517   ASSESSMENT/PLAN [name] is a 70 y.o. male with history of HTN, DM, CAD/MI, afib on eliquis presenting with AMS, garbled speech, N/V and behavior change.   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 Including filling both the third ventricle with layering blood in the fourth and lateral ventricle  Repeat CT head 05/21/2020-done for concern for rightward gaze-with no new changes. CTA head and neck with no LVO. Thalamic ICH is resolving  and blood seen layered in the posterior horns of lateral ventricles.  2D Echo EF 45-50%, No shunt  LDL 84  HgbA1c 9.4  VTE prophylaxis - SCDs, Lovenox 52m daily  Eliquis (apixaban) daily prior to admission, now on No  antithrombotic  Therapy recommendations:  SNF  Disposition:  SNF pending medical readiness  Son has been difficult to reach (?truckdriver) despite several attempts. He did come in this morning for discussion as above. Our discussion indicated he will need ongoing support for low health literacy, realistic goal setting and education regarding his father's illness. He wishes to proceed with full treatment.  Discussed with Dr. GCyndia Skeeterswho agrees to palliative care consult. I called the consult today. They Itzae Miralles not be able to address until tomorrow.  Son was asked to provide additional contact information so he can be  reached more readily.   Palliative care consult is Pending  His facility staff reported his baseline functioning was  very slow to speak, able to independently transfer to  chair.   IVH  S/p EVD  2/27 Neurosurgery instilled tPA into EVD  3/1 EVD weaned to 20 cm H20, ICPs stable  3/2 failed clamping trial  3/5 EVD accidentally dislodged. Reintubated.   CT head repeat 3/6 stable, repeat at night due to concern for gaze deviation also stable.   3/7NSG signed off.   Extubated 3/8. EEG showed no seizure  Hypertensive emergency  Home meds:  Lotensin, metoprolol  Stable on the low end  On Benazepril 458mdaily, metoprolol 504mid, cardizem 60 Q6 . Long-term BP goal normotensive  [facility]   On eliquis PTA  Reversed by Kcentra  No antithrombotics/anticoagulants due to ICHKnowlesontinue po cardizem and metoprolol for rate control  Cardiology consult appreciated: Rate control adequate.   Continue tele monitoring   EF 45%   Concern for seizure 3 nights ago. No recurrence   EEG with no evidence of seizures.  Severe diffuse encephalopathy of nonspecific etiology.  Continue Keppra 750 twice daily   HYPERNATREMIA -Likely multifactorial including medicines like Zosyn.  Zosyn discontinued 3/8. Free water was added.  Trending BMP. -Remains hyponatremic with sodium 148->151->151->151-->151 -Continue free water-appreciate CCM management.  ABDOMINAL TENDERNESS -Mild transaminitis.  Abdominal x-ray unremarkable. -If has more tenderness, will consider an ultrasound.  Appreciate CCM assistance.  Respiratory failure and LLL Pneumonia/sinusitis Fever - resolved  Extubated 3/1, vomited post extubation  Tachypnea re-intubated 3/5 and extubated 3/8  CCM on board and appreciated  Empiric antibiotics-completed 5 days of Zosyn.   WBC 8.2  (stable in last 4 days)  Tmax 100.2  Blood cultures 3/4 pending-no growth in 4 days  Appreciate CCM assistance with  management  Dysphagia  Likely due to hemorrhagic stroke  Failed swallow eval  On TF @ 55 37peech on board  Waxing and waning mentation has been prohibitive for       safe swallowing  High aspiration risk   Attempting to reach family has been unsuccessful. Left  voicemail with son today.   SLP unable to assess today due to somnolence.  Hyperlipidemia  Home meds:  crestor 20  LDL 84, goal < 70  Domingo Fuson continue statin at discharge  Diabetes type II Uncontrolled with hyperglycemia and episodes of hpoglycemia.  Home meds:  insulin  HgbA1c 9.4, goal < 7.0  SSI  Appreciate CCM assistance in management  Other Stroke Risk Factors  Advanced Age >/= 65 61Hx stroke/TIA  Coronary artery disease and MI  I have personally obtained history,examined this patient, reviewed notes, independently viewed imaging studies, participated in medical decision making and plan of care.ROS completed by me personally and pertinent positives fully documented  I have made any additions or clarifications directly to the above note. Agree with note above.  Patient has not made significant neurological improvement over the last 10 days continues to have significant decreased mental status as well as dysphagia Francina Beery need PEG tube and possibly reintubation and trach gets worse.  Long discussion of the bedside with the patient's son and answered questions.  Patient sons seems not to fully grasp situation and likely poor quality of life.  To consult palliative care team meeting with him his goals of care further.  Discussed with medical hospitalist.  Consider transfer to the floor stable.This patient is critically ill and at significant risk of neurological worsening, death and care requires constant monitoring of vital signs, hemodynamics,respiratory and cardiac monitoring, extensive review of multiple databases, frequent neurological assessment, discussion with family, other specialists and medical decision making  of high complexity.I have made any additions or clarifications directly to the above note.This critical care time does not reflect procedure time, or teaching time or supervisory time of PA/NP/Med Resident etc but could involve care discussion time.  I spent 32 minutes of neurocritical care time  in the care of  this patient.      Antony Contras, MD Medical Director [facility] Pager: [phone] 05/27/2020 1:32 PM

## 2020-05-28 DIAGNOSIS — Z7189 Other specified counseling: Secondary | ICD-10-CM

## 2020-05-28 DIAGNOSIS — I4819 Other persistent atrial fibrillation: Secondary | ICD-10-CM | POA: Diagnosis not present

## 2020-05-28 DIAGNOSIS — E870 Hyperosmolality and hypernatremia: Secondary | ICD-10-CM | POA: Diagnosis not present

## 2020-05-28 DIAGNOSIS — Z515 Encounter for palliative care: Secondary | ICD-10-CM | POA: Diagnosis not present

## 2020-05-28 DIAGNOSIS — R4182 Altered mental status, unspecified: Secondary | ICD-10-CM | POA: Diagnosis not present

## 2020-05-28 DIAGNOSIS — I615 Nontraumatic intracerebral hemorrhage, intraventricular: Secondary | ICD-10-CM | POA: Diagnosis not present

## 2020-05-28 DIAGNOSIS — R400 Somnolence: Secondary | ICD-10-CM | POA: Diagnosis not present

## 2020-05-28 DIAGNOSIS — I5022 Chronic systolic (congestive) heart failure: Secondary | ICD-10-CM | POA: Diagnosis not present

## 2020-05-28 LAB — CBC
HCT: 47.7 % (ref 39.0–52.0)
Hemoglobin: 14.7 g/dL (ref 13.0–17.0)
MCH: 27.9 pg (ref 26.0–34.0)
MCHC: 30.8 g/dL (ref 30.0–36.0)
MCV: 90.7 fL (ref 80.0–100.0)
Platelets: 263 10*3/uL (ref 150–400)
RBC: 5.26 MIL/uL (ref 4.22–5.81)
RDW: 14.8 % (ref 11.5–15.5)
WBC: 10.6 10*3/uL — ABNORMAL HIGH (ref 4.0–10.5)
nRBC: 0 % (ref 0.0–0.2)

## 2020-05-28 LAB — COMPREHENSIVE METABOLIC PANEL
ALT: 67 U/L — ABNORMAL HIGH (ref 0–44)
AST: 42 U/L — ABNORMAL HIGH (ref 15–41)
Albumin: 2.1 g/dL — ABNORMAL LOW (ref 3.5–5.0)
Alkaline Phosphatase: 68 U/L (ref 38–126)
Anion gap: 10 (ref 5–15)
BUN: 27 mg/dL — ABNORMAL HIGH (ref 8–23)
CO2: 26 mmol/L (ref 22–32)
Calcium: 8.3 mg/dL — ABNORMAL LOW (ref 8.9–10.3)
Chloride: 114 mmol/L — ABNORMAL HIGH (ref 98–111)
Creatinine, Ser: 0.88 mg/dL (ref 0.61–1.24)
GFR, Estimated: >60 mL/min (ref >60)
Glucose, Bld: 62 mg/dL — ABNORMAL LOW (ref 70–99)
Potassium: 4 mmol/L (ref 3.5–5.1)
Sodium: 150 mmol/L — ABNORMAL HIGH (ref 135–145)
Total Bilirubin: 0.5 mg/dL (ref 0.3–1.2)
Total Protein: 6.7 g/dL (ref 6.5–8.1)

## 2020-05-28 LAB — GLUCOSE, CAPILLARY
Glucose-Capillary: 110 mg/dL — ABNORMAL HIGH (ref 70–99)
Glucose-Capillary: 119 mg/dL — ABNORMAL HIGH (ref 70–99)
Glucose-Capillary: 152 mg/dL — ABNORMAL HIGH (ref 70–99)
Glucose-Capillary: 169 mg/dL — ABNORMAL HIGH (ref 70–99)
Glucose-Capillary: 76 mg/dL (ref 70–99)
Glucose-Capillary: 97 mg/dL (ref 70–99)

## 2020-05-28 LAB — MAGNESIUM: Magnesium: 2.6 mg/dL — ABNORMAL HIGH (ref 1.7–2.4)

## 2020-05-28 LAB — PHOSPHORUS: Phosphorus: 4.9 mg/dL — ABNORMAL HIGH (ref 2.5–4.6)

## 2020-05-28 MED ORDER — AMANTADINE HCL 100 MG PO CAPS
100.0000 mg | ORAL_CAPSULE | Freq: Two times a day (BID) | ORAL | Status: DC
  Filled 2020-05-28: qty 1

## 2020-05-28 MED ORDER — DILTIAZEM 12 MG/ML ORAL SUSPENSION
90.0000 mg | Freq: Four times a day (QID) | ORAL | Status: DC
  Administered 2020-05-29 – 2020-05-31 (×10): 90 mg
  Filled 2020-05-28 (×2): qty 12
  Filled 2020-05-28: qty 9
  Filled 2020-05-28 (×4): qty 12
  Filled 2020-05-28: qty 9
  Filled 2020-05-28 (×5): qty 12

## 2020-05-28 MED ORDER — AMANTADINE HCL 100 MG PO CAPS
100.0000 mg | ORAL_CAPSULE | Freq: Two times a day (BID) | ORAL | Status: DC
  Filled 2020-05-28 (×2): qty 1

## 2020-05-28 MED ORDER — POLYETHYLENE GLYCOL 3350 17 G PO PACK: 17.0000 g | PACK | Freq: Every day | ORAL | Status: DC | PRN

## 2020-05-28 MED ORDER — INSULIN ASPART 100 UNIT/ML ~~LOC~~ SOLN
2.0000 [IU] | SUBCUTANEOUS | Status: DC
  Administered 2020-05-28 – 2020-05-30 (×12): 2 [IU] via SUBCUTANEOUS

## 2020-05-28 MED ORDER — SENNOSIDES-DOCUSATE SODIUM 8.6-50 MG PO TABS
1.0000 | ORAL_TABLET | Freq: Two times a day (BID) | ORAL | Status: DC | PRN
  Administered 2020-06-06: 1
  Filled 2020-05-28: qty 1

## 2020-05-28 MED ORDER — INSULIN GLARGINE 100 UNIT/ML ~~LOC~~ SOLN
18.0000 [IU] | Freq: Two times a day (BID) | SUBCUTANEOUS | Status: DC
  Administered 2020-05-28 – 2020-05-30 (×5): 18 [IU] via SUBCUTANEOUS
  Filled 2020-05-28 (×7): qty 0.18

## 2020-05-28 MED ORDER — AMANTADINE HCL 50 MG/5ML PO SOLN
100.0000 mg | Freq: Two times a day (BID) | ORAL | Status: DC
  Administered 2020-05-29 – 2020-06-05 (×16): 100 mg
  Filled 2020-05-28 (×19): qty 10

## 2020-05-28 MED ORDER — INSULIN ASPART 100 UNIT/ML ~~LOC~~ SOLN: 2.0000 [IU] | SUBCUTANEOUS | Status: DC

## 2020-05-28 NOTE — Progress Notes (Addendum)
STROKE TEAM PROGRESS NOTE   SUBJECTIVE (INTERVAL HISTORY) Patient transferred to  Medical floor y`day Mild increased work of breathing remains when he wakes up.Remains somnolent requiring stimulation and coaching for exam. Unable to fully participate in ROS. Denies headache or other pain.  Hypernatremia persists with Na now down to 150 White count slightly high at 10.6.  Measurement phosphorus are normal.  No family at the bedside.  Signs stable.  CBC    Component Value Date/Time   WBC 10.6 (H) 05/28/2020 0404   RBC 5.26 05/28/2020 0404   HGB 14.7 05/28/2020 0404   HCT 47.7 05/28/2020 0404   PLT 263 05/28/2020 0404   MCV 90.7 05/28/2020 0404   MCH 27.9 05/28/2020 0404   MCHC 30.8 05/28/2020 0404   RDW 14.8 05/28/2020 0404   LYMPHSABS 1.0 05/18/2020 0920   MONOABS 1.3 (H) 05/18/2020 0920   EOSABS 0.0 05/18/2020 0920   BASOSABS 0.1 05/18/2020 0920   BMP Latest Ref Rng & Units 05/28/2020 05/27/2020 05/26/2020  Glucose 70 - 99 mg/dL 62(L) 97 214(H)  BUN 8 - 23 mg/dL 27(H) 26(H) 27(H)  Creatinine 0.61 - 1.24 mg/dL 0.88 0.80 0.82  Sodium 135 - 145 mmol/L 150(H) 153(H) 151(H)  Potassium 3.5 - 5.1 mmol/L 4.0 3.8 4.3  Chloride 98 - 111 mmol/L 114(H) 118(H) 118(H)  CO2 22 - 32 mmol/L $RemoveB'26 28 26  'HpkYjwrx$ Calcium 8.9 - 10.3 mg/dL 8.3(L) 8.4(L) 8.2(L)    IMAGING past 24 hours  EEG adult Result Date: 05/14/2020  IMPRESSION: This study is suggestive of moderate to evere diffuse encephalopathy, nonspecific etiology. No seizures or epileptiform discharges were seen throughout the recording.  PHYSICAL EXAM Temp:  [97.7 F (36.5 C)-100.5 F (38.1 C)] 97.7 F (36.5 C) (03/13 1203) Pulse Rate:  [62-95] 73 (03/13 1203) Resp:  [18-39] 32 (03/13 1203) BP: (118-144)/(57-85) 129/70 (03/13 1203) SpO2:  [95 %-100 %] 99 % (03/13 1203) Weight:  [98 kg] 98 kg (03/13 0433) Essentially  exam not significantly changed from yesterday-at different times as slightly different exam with differing levels of  wakefulness. General: Comfortably laying in bed and sleeping. HEENT: Normocephalic atraumatic Cardiovascular: Irregularly irregular Respiratory: Scattered rales and rhonchi with mildly increased effort of breathing Abdomen: Mildly tender,  extremities warm well perfused  . Neurological Exam Lethargic Very difficult to arouse Open eyes Was able to able to follow only occasional midline commands.   Was not able to verbalize his name today Inconsistent blink to threat from both sides Withdraws all 4 extremities slightly to noxious stimuli.  MED LIST  Current Facility-Administered Medications:  .  0.9 %  sodium chloride infusion, 250 mL, Intravenous, Continuous, Clark, Laura P, DO .  acetaminophen (TYLENOL) tablet 650 mg, 650 mg, Oral, Q4H PRN **OR** acetaminophen (TYLENOL) 160 MG/5ML solution 650 mg, 650 mg, Per Tube, Q4H PRN, 650 mg at 05/28/20 0008 **OR** acetaminophen (TYLENOL) suppository 650 mg, 650 mg, Rectal, Q4H PRN, Bhagat, Srishti L, MD, 650 mg at 05/17/20 1441 .  atorvastatin (LIPITOR) tablet 40 mg, 40 mg, Per NG tube, q1800, Olivencia-Simmons, Ivelisse, NP, 40 mg at 05/27/20 1804 .  chlorhexidine gluconate (MEDLINE KIT) (PERIDEX) 0.12 % solution 15 mL, 15 mL, Mouth Rinse, BID, Julian Hy, DO, 15 mL at 05/28/20 0828 .  Chlorhexidine Gluconate Cloth 2 % PADS 6 each, 6 each, Topical, Daily, Bhagat, Srishti L, MD, 6 each at 05/26/20 1400 .  diltiazem (CARDIZEM) tablet 90 mg, 90 mg, Per Tube, Q6H, Lestine Mount, PA-C, 90 mg at 05/28/20 1216 .  enoxaparin (LOVENOX) injection 40 mg, 40 mg, Subcutaneous, Daily, Garvin Fila, MD, 40 mg at 05/28/20 0809 .  feeding supplement (JEVITY 1.5 CAL/FIBER) liquid 1,000 mL, 1,000 mL, Per Tube, Continuous, Garvin Fila, MD, Last Rate: 55 mL/hr at 05/27/20 0614, 1,000 mL at 05/27/20 0614 .  feeding supplement (PROSource TF) liquid 45 mL, 45 mL, Per Tube, TID, Spero Geralds, MD, 45 mL at 05/28/20 0812 .  free water 250 mL, 250 mL, Per  Tube, Q3H, Wendee Beavers T, MD, 250 mL at 05/28/20 0826 .  Gerhardt's butt cream, , Topical, PRN, Merlene Laughter F, NP, 1 application at 73/53/29 1437 .  hydrALAZINE (APRESOLINE) tablet 25 mg, 25 mg, Per Tube, Q8H, Gonfa, Taye T, MD, 25 mg at 05/28/20 0615 .  insulin aspart (novoLOG) injection 0-20 Units, 0-20 Units, Subcutaneous, Q4H, Magdalen Spatz, NP, 4 Units at 05/28/20 1217 .  insulin aspart (novoLOG) injection 3 Units, 3 Units, Subcutaneous, Q4H, Lestine Mount, [place], 3 Units at 05/28/20 1218 .  insulin glargine (LANTUS) injection 20 Units, 20 Units, Subcutaneous, BID, [place] Crane M, PA-C, 20 Units at 05/28/20 0813 .  labetalol (NORMODYNE) injection 10 mg, 10 mg, Intravenous, Q2H PRN, Garvin Fila, MD, 10 mg at 05/24/20 0932 .  levETIRAcetam (KEPPRA) 100 MG/ML solution 750 mg, 750 mg, Per Tube, BID, Amie Portland, MD, 750 mg at 05/28/20 0809 .  MEDLINE mouth rinse, 15 mL, Mouth Rinse, q12n4p, Amie Portland, MD, 15 mL at 05/27/20 1805 .  metoprolol tartrate (LOPRESSOR) tablet 100 mg, 100 mg, Per Tube, TID, Merlene Laughter F, NP, 100 mg at 05/28/20 0813 .  ondansetron (ZOFRAN) injection 4 mg, 4 mg, Intravenous, Q6H PRN, Kipp Brood, MD, 4 mg at 05/19/20 1339 .  pantoprazole sodium (PROTONIX) 40 mg/20 mL oral suspension 40 mg, 40 mg, Per Tube, Daily, Noemi Chapel P, DO, 40 mg at 05/28/20 1216 .  polyethylene glycol (MIRALAX / GLYCOLAX) packet 17 g, 17 g, Per Tube, Daily, Corey Harold, NP, 17 g at 05/24/20 1026 .  senna-docusate (Senokot-S) tablet 1 tablet, 1 tablet, Per Tube, BID, Rosalin Hawking, MD, 1 tablet at 05/25/20 0905 .  thiamine tablet 100 mg, 100 mg, Per Tube, Daily, Bailey-Modzik, Delila A, NP, 100 mg at 05/28/20 9242   ASSESSMENT/PLAN Jason [name] is a 70 y.o. male with history of HTN, DM, CAD/MI, afib on eliquis presenting with AMS, garbled speech, N/V and behavior change.   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 Including filling both the third ventricle with layering blood in the fourth and lateral ventricle  Repeat CT head 05/21/2020-done for concern for rightward gaze-with no new changes. CTA head and neck with no LVO. Thalamic ICH is resolving and blood seen layered in the posterior horns of lateral ventricles.  2D Echo EF 45-50%, No shunt  LDL 84  HgbA1c 9.4  VTE prophylaxis - SCDs, Lovenox $RemoveBefor'40mg'JiNxNkjLoGzX$  daily  Eliquis (apixaban) daily prior to admission, now on No  antithrombotic  Therapy recommendations:  SNF  Disposition:  SNF pending medical readiness  Son has been difficult to reach (?truckdriver) despite several attempts. He did come in this morning  for discussion as above. Our discussion indicated he will need ongoing support for low health literacy, realistic goal setting and education regarding his father's illness. He wishes to proceed with full treatment.  Discussed with Dr. Cyndia Skeeters who agrees to palliative care consult. I called the consult today. They may not be able to address until tomorrow.  Son was asked to provide additional contact information so he can be reached more readily.   Palliative care consult is Pending  His facility staff reported his baseline functioning was  very slow to speak, able to independently transfer to  chair.   IVH  S/p EVD  2/27 Neurosurgery instilled tPA into EVD  3/1 EVD weaned to 20 cm H20, ICPs stable  3/2 failed clamping trial  3/5 EVD accidentally dislodged. Reintubated.   CT head repeat 3/6 stable, repeat at night due to concern for gaze deviation also stable.   3/7NSG signed off.   Extubated 3/8. EEG showed no  seizure  Hypertensive emergency  Home meds:  Lotensin, metoprolol  Stable on the low end  On Benazepril $RemoveBefor'40mg'AaGnkqtMdfbj$  daily, metoprolol $RemoveBeforeDEI'50mg'yKEqzxbbZZabCOAb$  bid, cardizem 60 Q6 . 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   Continue tele monitoring   EF 45%   Concern for seizure 3 nights ago. No recurrence   EEG with no evidence of seizures.  Severe diffuse encephalopathy of nonspecific etiology.  Continue Keppra 750 twice daily   HYPERNATREMIA -Likely multifactorial including medicines like Zosyn.  Zosyn discontinued 3/8. Free water was added.  Trending BMP. -Remains hyponatremic with sodium 148->151->151->151-->151 -Continue free water-appreciate CCM management.  ABDOMINAL TENDERNESS -Mild transaminitis.  Abdominal x-ray unremarkable. -If has more tenderness, will consider an ultrasound.  Appreciate CCM assistance.  Respiratory failure and LLL Pneumonia/sinusitis Fever - resolved  Extubated 3/1, vomited post extubation  Tachypnea re-intubated 3/5 and extubated 3/8  CCM on board and appreciated  Empiric antibiotics-completed 5 days of Zosyn.   WBC 8.2  (stable in last 4 days)  Tmax 100.2  Blood cultures 3/4 pending-no growth in 4 days  Appreciate CCM assistance with management  Dysphagia  Likely due to hemorrhagic stroke  Failed swallow eval  On TF @ 41  Speech on board  Waxing and waning mentation has been prohibitive for       safe swallowing  High aspiration risk   Attempting to reach family has been unsuccessful. Left  voicemail with son today.   SLP unable to assess today due to somnolence.  Hyperlipidemia  Home meds:  crestor 20  LDL 84, goal < 70  May continue statin at discharge  Diabetes type II Uncontrolled with hyperglycemia and  episodes of hpoglycemia.  Home meds:  insulin  HgbA1c 9.4, goal < 7.0  SSI  Appreciate CCM assistance in management  Other Stroke Risk Factors  Advanced Age >/= 48   Hx stroke/TIA Patient has not made significant neurological improvement over the last 11 days continues to have significant decreased mental status as well as dysphagia may need PEG tube and possibly reintubation and trach gets worse .I spoke to son yesterday who wanted full aggressive support.  I think palliative care consult would be appropriate.  Plan to start amantadine 100 mg twice daily to hopefully improve his arousal and mental status.  Discussed with medical hospitalist.   Greater than  50% time during this 25-minute visit was spent on counseling and coordination of care and discussion with care team.     Antony Contras, MD Medical Director [facility] Pager: [phone] 05/28/2020 12:41 PM

## 2020-05-28 NOTE — Consult Note (Addendum)
WOC Nurse Consult Note: Reason for Consult: Purple and blistering area in the lower buttocks, intragluteal cleft Wound type: pressure, shear Pressure Injury POA: No Measurement: 9cm x 11cm area of purple/maroon discolored, non blanching  skin with blistering Wound bed:one ruptured blister reveals purple/maroon wound bed Drainage (amount, consistency, odor) serous Periwound:as described above Dressing procedure/placement/frequency: Patient with HOB elevated to a 30 degree angle for tube feeding, does not turn and reposition independently. He is wearing mittens bilaterally to prevent tube dislodgement. Fecal incontinence continues. I will provide an air mattress and bilateral pressure redistribution heel boots, also topical care will be augmented with a wound contact layer of xeroform. Continue Gerhart's butt cream to medial and posterior thighs.  WOC nursing team will not follow, but will remain available to this patient, the nursing and medical teams.  Please re-consult if needed. Thanks, Ladona Mow, MSN, RN, GNP, [place]  Pager# [phone]

## 2020-05-28 NOTE — Consult Note (Signed)
Consultation Note Date: 05/28/2020   Patient Name: Jason [name]  DOB: 1950-11-04  MRN: [id number]  Age / Sex: 70 y.o., male  PCP: Jason Monday, MD Referring Physician: Almon Hercules, MD  Reason for Consultation: Establishing goals of care  HPI/Patient Profile: 70 y.o. male  with past medical history of DM, previous CVA, CAD/MI/CABG/Afib on anticoagulation, anxiety and depression who was admitted on 05/11/2020 with altered mental status and hypertensive emergency.  Imaging revealed hemorrhage of the left thalamus with intro ventricular extension.   He was intubated and extubated 2x and has remained encephalopathic.  He is currently in a floor bed.  His albumin is 2.1.  Most current CT scan of his head on 3/6 Stable mild ventriculomegaly and intraventricular hemorrhage dependently within the occipital horns  Clinical Assessment and Goals of Care:  I have reviewed medical records including EPIC notes, labs and imaging, received report from the care team, examined the patient and spoke on the phone with his son Jason [name] to discuss diagnosis prognosis, GOC, disposition and options.  I introduced Palliative Medicine as specialized medical care for people living with serious illness. It focuses on providing relief from the symptoms and stress of a serious illness.   We discussed a brief life review of the patient. Jason [name] describes his dad as a talker who sometimes doesn't let the other person get a word in edgewise.  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   He used to work constantly.  If he was still or bored he would sleep.  If he was in pain or felt poorly he would sleep.  Per Jason [name] if something was wrong with Jason [name] he would want to sleep it off.  Jason [name] had 2 sons and a daughter.  His other son hasn't seen him in quite some time and  his daughter takes care of the patient's 63 year old mother.   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  When he had hemorrhoid surgery he slept for a week.  Jason [name] states that his father was discharged to SNF after his December hospitalization.  Since that time Jason [name] has been trying to manage his father's properties and continue his own full time job.  He said his father was just about to be released from SNF.  Jason [name] had taken him to [place] on 05/07/20 for his birthday.  His father was up and walking with out a walker.  He made his own plate of food and ate well.  We discussed her current illness and what it means in the larger context of her on-going co-morbidities.  Natural disease trajectory and expectations at EOL were discussed. I attempted to elicit values and goals of care important to the patient.  Jason [name] and his father Jason [name] never had in depth conversations about feeding tubes or tracheostomies but Jason [name] feels strongly that  his father needs more time.  "Its only been two weeks".  Jason [name] stated his father needs the feeding tube and if necessary he would get the tracheostomy as well.   I encouraged Jason [name] that if several months go by without significant improvement to please re-consider his position.  He would not want to leave his father to suffer with a terrible quality of life.  Jason [name] agreed.  Questions and concerns were addressed.  The family was encouraged to call with questions or concerns.    Primary Decision Maker:  NEXT OF KIN son Jason [name]    SUMMARY OF RECOMMENDATIONS    Move forward with placing PEG. If needed move forward with placing Trach.    Son adamant about giving his father more time to heal. Please request that Palliative Care follow at the next venue (SNF or LTAC)  Code Status/Advance Care Planning:  Full code   Symptom Management:   Per primary.  Additional Recommendations (Limitations, Scope,  Preferences):  Full Scope Treatment  Palliative Prophylaxis:   Aspiration, Bowel Regimen, Frequent Pain Assessment and Turn Reposition  Psycho-social/Spiritual:   Desire for further Chaplaincy support: Patient is Jason [name] but further Chaplaincy support was not discussed.  Prognosis:  Very concerning.  Even with Trach/PEG and full code status patient's prognosis is likely less than 1 year.    Discharge Planning: To Be Determined      Primary Diagnoses: Present on Admission: . Pressure injury of skin   I have reviewed the medical record, interviewed the patient and family, and examined the patient. The following aspects are pertinent.  Past Medical History:  Diagnosis Date  . Anxiety   . Coronary artery disease   . Depression   . Diabetes mellitus without complication (HCC)   . Dysrhythmia   . Hypertension   . Myocardial infarction (HCC)   . Stroke [facility])    Social History   Socioeconomic History  . Marital status: Single    Spouse name: Not on file  . Number of children: 1  . Years of education: Not on file  . Highest education level: Not on file  Occupational History  . Occupation: Retired  Tobacco Use  . Smoking status: Never Smoker  . Smokeless tobacco: Never Used  Vaping Use  . Vaping Use: Never used  Substance and Sexual Activity  . Alcohol use: Not Currently  . Drug use: Not Currently  . Sexual activity: Not Currently  Other Topics Concern  . Not on file  Social History Narrative   Patient states he lives alone and is having trouble taking care of residence.  States he has son who lives in [place] but does not know his phone number.    Social Determinants of Health   Financial Resource Strain: Not on file  Food Insecurity: Not on file  Transportation Needs: Not on file  Physical Activity: Not on file  Stress: Not on file  Social Connections: Not on file   No family history on file.  No Known Allergies   Vital Signs: BP 131/72    Pulse 73   Temp 97.7 F (36.5 C) (Axillary)   Resp (!) 32   Ht 5\' 10"  (1.778 m)   Wt 98 kg   SpO2 99%   BMI 31.00 kg/m  Pain Scale: CPOT   Pain Score: Asleep   SpO2: SpO2: 99 % O2 Device:SpO2: 99 % O2 Flow Rate: .O2 Flow Rate (L/min): 2 L/min    Palliative Assessment/Data:10%     Time In:  2:00 Time Out: 3:15 Time Total: 75 min. Visit consisted of counseling and education dealing with the complex and emotionally intense issues surrounding the need for palliative care and symptom management in the setting of serious and potentially life-threatening illness. Greater than 50%  of this time was spent counseling and coordinating care related to the above assessment and plan.  Signed by: Norvel Richards, PA-C Palliative Medicine  Please contact Palliative Medicine Team phone at [phone] for questions and concerns.  For individual provider: See Loretha Stapler

## 2020-05-28 NOTE — Progress Notes (Addendum)
PROGRESS NOTE  Jason [name] FBP:[id number] DOB: [birth year]   PCP: Jodi Marble, MD  Patient is from: Home  DOA: 05/11/2020 LOS: 26   Brief Narrative / Interim history: 70 year old M with PMH of CAD, DM-2, PAF on Eliquis, CVA, HTN and systolic CHF admitted to ICU on 05/11/2020 with >1.2cm thalamic hemorrhage with intraventricular extension in the setting of hypertensive emergency.  He received Kcentra for Eliquis reversal.  He was intubated and had external ventricular drain placed on 05/12/2020.  He was extubated on 05/16/2020.  EVD dislodged on 05/20/2020.  He was reintubated on 3/5, and eventually extubated to BiPAP on 3/8.  He was transferred to [facility] service on 3/12 on room air but persistent encephalopathy/semi-vegetative state  Subjective: Seen and examined earlier this morning.  No major events overnight of this morning.  Remains somnolent.  Barely opens his eyes in response to noxious stimuli.  Tachypneic but saturating in upper 90s to 100% on RA. Objective: Vitals:   05/28/20 0433 05/28/20 0615 05/28/20 0759 05/28/20 1203  BP: (!) 118/57 135/75 136/67 129/70  Pulse: 95  62 73  Resp:   (!) 30 (!) 32  Temp:   98.5 F (36.9 C) 97.7 F (36.5 C)  TempSrc:   Axillary Axillary  SpO2: 99%  99% 99%  Weight: 98 kg     Height:        Intake/Output Summary (Last 24 hours) at 05/28/2020 1418 Last data filed at 05/28/2020 0815 Gross per 24 hour  Intake 1555 ml  Output 1325 ml  Net 230 ml   Filed Weights   05/26/20 0417 05/27/20 0414 05/28/20 0433  Weight: 94.9 kg 93.6 kg 98 kg    Examination:  GENERAL: Chronically ill-appearing. HEENT: Dry oral mucosa.  PERRL. Cortrak in place NECK: Supple.  No apparent JVD.  RESP: 100% on RA.  Tachypneic. Some work of breath.  Shallow breaths. CVS: In A flutter in 53s. Heart sounds normal.  ABD/GI/GU: BS+. Abd soft, NTND.  MSK/EXT:  Moves extremities. No apparent deformity.  Trace dependent edematous bilaterally. SKIN: Stage II pressure  skin injury of bilateral buttocks NEURO: Somnolent.  Barely opens his eyes in response to noxious stimuli.  PERRL.  No facial asymmetry.  Patellar reflex symmetric. PSYCH: Somnolent.  No distress or agitation.  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  Barely opens his eyes in response to noxious stimuli. -In the setting of hypertensive emergency (BP 205/120 on admit) and anticoagulation for A. Fib -Received KCentra for Eliquis reversal in ED -External ventricular drain from 2/25-3/5.  -EEG with generalized slowing consistent with moderate to severe diffuse encephalopathy -On Keppra for seizure prophylaxis -On tube feed via cortrak  -Neurology following and consulted palliative medicine. -Therapy recommended SNF  Acute respiratory failure with hypoxia in the setting of the above: ntubation and mechanical ventilation from 2/25-3/8.  Currently on room air.  However, tachypneic with shallow breaths and some work of breathing.  He is high risk for reintubation -Aspiration precautions and pulmonary hygiene  Persistent A. fib/A flutter: in A flutter with rate in 60s this morning. Was on Eliquis POA. -Continue holding  Eliquis -Continue Cardizem and Lopressor per tube -No anticoagulation in the setting of ICH -Optimize K and Mg  Chronic systolic CHF: Stable.  LVEF 45 to 50%.  He has some dependent edema but no overt fluid overload. -Monitor fluid status and respiratory status -Not on diuretics.  Hypernatremia: Na 153>> 150.  Improved. -Increase free water to 250 cc every 3 hours on 3/12. -Recheck  in the morning  Hypertensive emergency/uncontrolled hypertension: BP 205/120 on admit.  Now normotensive. -Continue Cardizem 90 mg every 6 hours -Continue metoprolol tartrate 100 mg 3 times daily -Scheduled hydralazine at 25 mg every 8 hours on 3/12 -Continue as needed labetalol with parameters  History of CAD: Stable -Cardiac meds as above.  Uncontrolled IDDM-2: A1c 9.4% Recent Labs  Lab 05/27/20 2020 05/28/20 0000 05/28/20 0419 05/28/20 0801 05/28/20 1201  GLUCAP 145* 110* 76 152* 169*  -Decrease Lantus from 22 to 18 units twice daily -Decrease tube feed coverage from 3 units to 2 units -Continue SSI-high -Continue statin   Physical deconditioning -Continue PT/OT/SLP efforts   Goal of care: Significant comorbidity as above.  Very poor prognosis.  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  He is a still full code.  I briefly discussed about his CODE STATUS and prognosis with patient's son, [name] over the phone.  I have also discussed the pros and cons of CPR and intubation, and recommended against those aggressive interventions. [name] wants full code with full scope is of care including tracheostomy and feeding tube if necessary. He says "I want him to give him a chance".  -Agree with palliative care consult per neurology  Nutrition-continue tube feed via cortrak for now. May need PEG unless goal of care changes. Body mass index is 31 kg/m. Nutrition Problem: Inadequate oral intake Etiology: inability to eat Signs/Symptoms: NPO status Interventions: Tube feeding,Prostat   Pressure skin injury: Stage II Pressure Injury 05/24/20 Buttocks Bilateral;Medial Deep Tissue Pressure Injury - Purple or maroon localized area of discolored intact skin or blood-filled blister due to damage of underlying soft tissue from pressure and/or shear. Multiple blisters along (Active)  05/24/20 0800  Location: Buttocks  Location Orientation: Bilateral;Medial  Staging: Deep Tissue Pressure Injury -  Purple or maroon localized area of discolored intact skin or blood-filled blister due to damage of underlying soft tissue from pressure and/or shear.  Wound Description (Comments): Multiple blisters along with deep tissue injury.  Present on Admission:    DVT prophylaxis:  enoxaparin (LOVENOX) injection 40 mg Start: 05/15/20 1130 SCD's Start: 05/11/20 2335  Code Status: Full code Family Communication: Updated patient's son, [name] over the phone. Level of care: Telemetry Cardiac Status is: Inpatient  Remains inpatient appropriate because:Hemodynamically unstable, Persistent severe electrolyte disturbances, Altered mental status, Unsafe d/c plan, IV treatments appropriate due to intensity of illness or inability to take PO and Inpatient level of care appropriate due to severity of illness   Dispo: The patient is from: Home              Anticipated d/c is to: SNF              Patient currently is not medically stable to d/c.   Difficult to place patient No       Consultants:  Neurosurgery Intensivist Cardiology Neurology Palliative medicine   Sch Meds:  Scheduled Meds: . amantadine  100 mg Per Tube BID  . atorvastatin  40 mg Per NG tube q1800  . chlorhexidine gluconate (MEDLINE KIT)  15 mL Mouth Rinse BID  .  diltiazem  90 mg Per Tube Q6H  . enoxaparin (LOVENOX) injection  40 mg Subcutaneous Daily  . feeding supplement (PROSource TF)  45 mL Per Tube TID  . free water  250 mL Per Tube Q3H  . hydrALAZINE  25 mg Per Tube Q8H  . insulin aspart  0-20 Units Subcutaneous Q4H  . insulin aspart  3 Units Subcutaneous Q4H  . insulin glargine  20 Units Subcutaneous BID  . levETIRAcetam  750 mg Per Tube BID  . mouth rinse  15 mL Mouth Rinse q12n4p  . metoprolol tartrate  100 mg Per Tube TID  . pantoprazole sodium  40 mg Per Tube Daily  . thiamine  100 mg Per Tube Daily   Continuous Infusions: . sodium chloride    . feeding supplement (JEVITY 1.5 CAL/FIBER) 1,000 mL (05/27/20  0614)   PRN Meds:.acetaminophen **OR** acetaminophen (TYLENOL) oral liquid 160 mg/5 mL **OR** acetaminophen, Gerhardt's butt cream, labetalol, ondansetron (ZOFRAN) IV, polyethylene glycol, senna-docusate  Antimicrobials: Anti-infectives (From admission, onward)   Start     Dose/Rate Route Frequency Ordered Stop   05/21/20 1100  vancomycin (VANCOREADY) IVPB 1750 mg/350 mL  Status:  Discontinued        1,750 mg 175 mL/hr over 120 Minutes Intravenous Every 24 hours 05/20/20 0953 05/23/20 1046   05/20/20 1045  vancomycin (VANCOREADY) IVPB 2000 mg/400 mL        2,000 mg 200 mL/hr over 120 Minutes Intravenous  Once 05/20/20 0948 05/20/20 1341   05/19/20 1015  cefTRIAXone (ROCEPHIN) 2 g in sodium chloride 0.9 % 100 mL IVPB  Status:  Discontinued        2 g 200 mL/hr over 30 Minutes Intravenous Every 24 hours 05/18/20 0958 05/18/20 1132   05/18/20 1400  piperacillin-tazobactam (ZOSYN) IVPB 3.375 g  Status:  Discontinued        3.375 g 12.5 mL/hr over 240 Minutes Intravenous Every 8 hours 05/18/20 1132 05/23/20 1046   05/17/20 1015  cefTRIAXone (ROCEPHIN) 1 g in sodium chloride 0.9 % 100 mL IVPB  Status:  Discontinued        1 g 200 mL/hr over 30 Minutes Intravenous Every 24 hours 05/17/20 0918 05/18/20 0958   05/12/20 1530  ceFAZolin (ANCEF) IVPB 1 g/50 mL premix  Status:  Discontinued        1 g 100 mL/hr over 30 Minutes Intravenous Every 8 hours 05/12/20 1440 05/17/20 0918       I have personally reviewed the following labs and images: CBC: Recent Labs  Lab 05/23/20 0333 05/24/20 0327 05/24/20 1200 05/25/20 0313 05/26/20 0104 05/28/20 0404  WBC 12.0* 12.2*  --  9.5 8.3 10.6*  HGB 13.7 14.5 12.9* 13.7 13.8 14.7  HCT 44.4 46.2 38.0* 42.6 44.8 47.7  MCV 90.4 90.4  --  89.7 90.5 90.7  PLT 188 211  --  242 256 263   BMP &GFR Recent Labs  Lab 05/22/20 1251 05/23/20 0333 05/24/20 0327 05/24/20 1200 05/25/20 0313 05/26/20 0104 05/27/20 0407 05/28/20 0404  NA  --    < > 148*  151* 151* 151* 153* 150*  K  --    < > 3.6 3.6 3.8 4.3 3.8 4.0  CL  --    < > 112*  --  116* 118* 118* 114*  CO2  --    < > 25  --  _0 GLUCOSE  --    < > 140*  --  169* 214* 97 62*  BUN  --    < > 27*  --  27* 27* 26* 27*  CREATININE  --    < > 0.82  --  0.86 0.82 0.80 0.88  CALCIUM  --    < > 8.3*  --  8.3* 8.2* 8.4* 8.3*  MG 2.4  --   --   --   --   --   --  2.6*  PHOS  --   --   --   --   --   --   --  4.9*   < > = values in this interval not displayed.   Estimated Creatinine Clearance: 90.4 mL/min (by C-G formula based on SCr of 0.88 mg/dL). Liver & Pancreas: Recent Labs  Lab 05/24/20 0327 05/25/20 0313 05/28/20 0404  AST 72* 83* 42*  ALT 57* 90* 67*  ALKPHOS 63 61 68  BILITOT 0.5 0.5 0.5  PROT 6.5 6.2* 6.7  ALBUMIN 2.2* 2.0* 2.1*   No results for input(s): LIPASE, AMYLASE in the last 168 hours. No results for input(s): AMMONIA in the last 168 hours. Diabetic: No results for input(s): HGBA1C in the last 72 hours. Recent Labs  Lab 05/27/20 2020 05/28/20 0000 05/28/20 0419 05/28/20 0801 05/28/20 1201  GLUCAP 145* 110* 76 152* 169*   Cardiac Enzymes: No results for input(s): CKTOTAL, CKMB, CKMBINDEX, TROPONINI in the last 168 hours. No results for input(s): PROBNP in the last 8760 hours. Coagulation Profile: No results for input(s): INR, PROTIME in the last 168 hours. Thyroid Function Tests: No results for input(s): TSH, T4TOTAL, FREET4, T3FREE, THYROIDAB in the last 72 hours. Lipid Profile: No results for input(s): CHOL, HDL, LDLCALC, TRIG, CHOLHDL, LDLDIRECT in the last 72 hours. Anemia Panel: No results for input(s): VITAMINB12, FOLATE, FERRITIN, TIBC, IRON, RETICCTPCT in the last 72 hours. Urine analysis:    Component Value Date/Time   COLORURINE YELLOW 05/11/2020 2100   APPEARANCEUR HAZY (A) 05/11/2020 2100   LABSPEC 1.011 05/11/2020 2100   PHURINE 7.0 05/11/2020 2100   GLUCOSEU 50 (A) 05/11/2020 2100   HGBUR LARGE (A) 05/11/2020 2100    BILIRUBINUR NEGATIVE 05/11/2020 2100   KETONESUR 20 (A) 05/11/2020 2100   PROTEINUR 100 (A) 05/11/2020 2100   NITRITE NEGATIVE 05/11/2020 2100   LEUKOCYTESUR MODERATE (A) 05/11/2020 2100   Sepsis Labs: Invalid input(s): PROCALCITONIN, [place]  Microbiology: Recent Results (from the past 240 hour(s))  Culture, blood (Routine X 2) w Reflex to ID Panel     Status: None   Collection Time: 05/19/20 12:44 AM   Specimen: BLOOD  Result Value Ref Range Status   Specimen Description BLOOD RIGHT ARM  Final   Special Requests   Final    BOTTLES DRAWN AEROBIC AND ANAEROBIC Blood Culture adequate volume   Culture   Final    NO GROWTH 5 DAYS Performed at [facility], [address], [place], [place] [address]    Report Status 05/24/2020 FINAL  Final  Culture, blood (Routine X 2) w Reflex to ID Panel     Status: None   Collection Time: 05/19/20 12:59 AM   Specimen: BLOOD RIGHT HAND  Result Value Ref Range Status   Specimen Description BLOOD RIGHT HAND  Final   Special Requests   Final    BOTTLES DRAWN AEROBIC AND ANAEROBIC Blood Culture results may not be optimal due to an inadequate volume of blood received in culture bottles   Culture   Final    NO GROWTH 5 DAYS Performed at  [facility], [place] [address] [place], [place] [address]    Report Status 05/24/2020 FINAL  Final  Expectorated Sputum Assessment w Gram Stain, Rflx to Resp Cult     Status: None   Collection Time: 05/19/20  6:30 AM   Specimen: Expectorated Sputum  Result Value Ref Range Status   Specimen Description EXPECTORATED SPUTUM  Final   Special Requests Normal  Final   Sputum evaluation   Final    THIS SPECIMEN IS ACCEPTABLE FOR SPUTUM CULTURE Performed at [facility], [place] [address] [place], [place] [address]    Report Status 05/22/2020 FINAL  Final  Culture, Respiratory w Gram Stain     Status: None   Collection Time: 05/19/20  6:30 AM  Result Value Ref Range Status   Specimen Description  EXPECTORATED SPUTUM  Final   Special Requests Normal Reflexed from [id number]  Final   Gram Stain   Final    ABUNDANT WBC PRESENT,BOTH PMN AND MONONUCLEAR FEW SQUAMOUS EPITHELIAL CELLS PRESENT FEW GRAM POSITIVE COCCI RARE GRAM VARIABLE ROD    Culture   Final    RARE Normal respiratory flora-no Staph aureus or Pseudomonas seen Performed at [facility], [place] [address], [place], [place] [address]    Report Status 05/21/2020 FINAL  Final    Radiology Studies: DG Chest Port 1 View  Result Date: 05/27/2020 CLINICAL DATA:  Tachypnea EXAM: PORTABLE CHEST 1 VIEW COMPARISON:  05/20/2020 FINDINGS: Interval extubation. Enteric tube terminates within the stomach. Post CABG changes. Stable cardiomediastinal contours. Atherosclerotic calcification of the aortic knob. Minimal streaky left basilar opacity, likely atelectasis. No pleural effusion or pneumothorax. Remote right-sided rib fractures. IMPRESSION: Interval extubation.  Otherwise, no significant interval change. Electronically Signed   By: Davina Poke D.O.   On: 05/27/2020 18:09      Shelita Steptoe T. [place]  If 7PM-7AM, please contact night-coverage [email or web address] 05/28/2020, 2:18 PM

## 2020-05-29 DIAGNOSIS — I161 Hypertensive emergency: Secondary | ICD-10-CM | POA: Diagnosis not present

## 2020-05-29 DIAGNOSIS — R4182 Altered mental status, unspecified: Secondary | ICD-10-CM | POA: Diagnosis not present

## 2020-05-29 DIAGNOSIS — Z515 Encounter for palliative care: Secondary | ICD-10-CM | POA: Diagnosis not present

## 2020-05-29 DIAGNOSIS — R400 Somnolence: Secondary | ICD-10-CM | POA: Diagnosis not present

## 2020-05-29 DIAGNOSIS — I615 Nontraumatic intracerebral hemorrhage, intraventricular: Secondary | ICD-10-CM | POA: Diagnosis not present

## 2020-05-29 LAB — GLUCOSE, CAPILLARY
Glucose-Capillary: 146 mg/dL — ABNORMAL HIGH (ref 70–99)
Glucose-Capillary: 159 mg/dL — ABNORMAL HIGH (ref 70–99)
Glucose-Capillary: 163 mg/dL — ABNORMAL HIGH (ref 70–99)
Glucose-Capillary: 182 mg/dL — ABNORMAL HIGH (ref 70–99)
Glucose-Capillary: 186 mg/dL — ABNORMAL HIGH (ref 70–99)
Glucose-Capillary: 188 mg/dL — ABNORMAL HIGH (ref 70–99)

## 2020-05-29 MED ORDER — MORPHINE SULFATE (PF) 2 MG/ML IV SOLN
2.0000 mg | INTRAVENOUS | Status: DC | PRN
Start: 2020-05-29 — End: 2020-06-06
  Administered 2020-06-01 – 2020-06-06 (×6): 2 mg via INTRAVENOUS
  Filled 2020-05-29 (×7): qty 1

## 2020-05-29 NOTE — Progress Notes (Signed)
Daily Progress Note   Patient Name: Jason [name]       Date: 05/29/2020 DOB: [birth year]  Age: 70 y.o. MRN#: [id number] Attending Physician: Pennie Banter, DO Primary Care Physician: Sherron Monday, MD Admit Date: 05/11/2020  Reason for Consultation/Follow-up: To discuss complex medical decision making related to patient's goals of care and symptom management  Subjective: Dr. Cyndie Chime and I saw the patient and bedside and spoke with his RN.  Per RN patient was able to respond minimally earlier in the day.  I am getting no response to my exam or loud voice on our visit.  Patient has an irregular breathing pattern - shallow and fast with some snoring followed by some apnea.  However his sats remain normal.   Assessment: Concerned for patient's discomfort due to breathing pattern and patient's turning his head back and forth in bed. He seems to have trouble managing his secretions .  Son opts for aggressive interventions to give his father more time.  Patient Profile/HPI:  70 y.o. male  with past medical history of DM, previous CVA, CAD/MI/CABG/Afib on anticoagulation, anxiety and depression who was admitted on 05/11/2020 with altered mental status and hypertensive emergency.  Imaging revealed hemorrhage of the left thalamus with intro ventricular extension.   He was intubated and extubated 2x and has remained encephalopathic.  He is currently in a floor bed.  His albumin is 2.1.  Most current CT scan of his head on 3/6 Stable mild ventriculomegaly and intraventricular hemorrhage dependently within the occipital horns  Length of Stay: 18   Vital Signs: BP 117/75 (BP Location: Left Arm)   Pulse 68   Temp 97.7 F (36.5 C) (Axillary)   Resp (!) 24   Ht 5\' 10"  (1.778 m)   Wt 98 kg   SpO2 100%    BMI 31.00 kg/m  SpO2: SpO2: 100 % O2 Device: O2 Device: Room Air O2 Flow Rate: O2 Flow Rate (L/min): 2 L/min       Palliative Assessment/Data: 10%     Palliative Care Plan    Recommendations/Plan: Son requests (3/13) that medical team move forward with PEG and if needed Trach.  Wants his father to have time to attempt to recover. Added morphine 1 mg PRN q 4 hours dyspnea or discomfort.  Code Status:  Full code  Prognosis: Patient has a poor prognosis for recovery.  I pray I am wrong but I am afraid he will not functionally recover and his prognosis will be months even with a trach/peg.  Discharge Planning: To Be Determined   Thank you for allowing the Palliative Medicine Team to assist in the care of this patient.  Total time spent:  35 min.     Greater than 50%  of this time was spent counseling and coordinating care related to the above assessment and plan.  Norvel Richards, PA-C Palliative Medicine  Please contact Palliative MedicineTeam phone at [phone] for questions and concerns between 7 am - 7 pm.   Please see AMION for individual provider pager numbers.

## 2020-05-29 NOTE — TOC CAGE-AID Note (Signed)
Transition of Care [facility]) - CAGE-AID Screening   Patient Details  Name: Jason [name] MRN: [id number] Date of Birth: 17-Jan-1951   Clinical Narrative: Patient from SNF admitted d/t brain hemorrhage, weak and not speaking at this time. Unable to participate in screening.    CAGE-AID Screening: Substance Abuse Screening unable to be completed due to: : Patient unable to participate

## 2020-05-29 NOTE — Progress Notes (Signed)
Physical Therapy Treatment Patient Details Name: Jason [name] MRN: [id number] DOB: [birth year] Today's Date: 05/29/2020    History of Present Illness Pt is a 70 y.o. male admitted from [place] [facility] on 05/11/20 with AMS. 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 S/p R frontal IVC placement. ETT 2/25-3/1; EVD accidentally disloged 3/5 and pt reintubated; extubated again 3/8. Concern for new rightward gaze 3/6 with repeat head CT showing no new changes; thalamic ICH resolving. EEG 3/8 showed no seizures. Pt with dysphagia; plan for PEG placement 3/15. PMH includes DM, HTN, CVA, CABG, afib with RVR, anxiety, depression; of note, recent admission from home 02/2020 with DKA, d/c to SNF for rehab.   PT Comments    Pt remains lethargic with apparent slowed processing and inconsistent command following. Pt opens eyes to stimuli and at times follows simple commands, but quick to close eyes again. Pt requires maxA+2 to totalA for bed-level mobility, dependent for ADL task. Planned to initiate transfer to recliner via maximove lift, but pt with bowel incontinence and need for sacral wound dressing change, limited by increased fatigue with this. Pt repositioned to encourage BLE neutral alignment and pressure relief. Discussed turning schedule with nursing; note plan for PEG tube placement tomorrow. Will continue to follow acutely and progress activity as tolerated.    Follow Up Recommendations  SNF;Supervision for mobility/OOB     Equipment Recommendations  Hospital bed;Wheelchair (measurements PT);Wheelchair cushion (measurements PT);Hoyer lift   Recommendations for Other Services       Precautions / Restrictions Precautions Precautions: Fall;Other (comment) Precaution Comments: Bladder/bowel incontinence; sacral wound w/ dressing; Cortrak; soft mitt to L hand Restrictions Weight Bearing Restrictions: No    Mobility  Bed Mobility Overal bed mobility: Needs  Assistance Bed Mobility: Rolling Rolling: Max assist;+2 for physical assistance;+2 for safety/equipment         General bed mobility comments: Multiple rolls R/L for pad placement, linen change, pericare and repositioning; pt with bowel incontinence and becoming fatigued with multiple bouts of bed-level activity; deferred transfer to recliner with maximove lift    Transfers                 General transfer comment: Will require maximove lift for transfer to recliner  Ambulation/Gait                 Stairs             Wheelchair Mobility    Modified Rankin (Stroke Patients Only)       Balance                                            Cognition Arousal/Alertness: Lethargic Behavior During Therapy: Flat affect Overall Cognitive Status: Difficult to assess Area of Impairment: Attention;Following commands;Awareness;Problem solving                   Current Attention Level: Focused   Following Commands: Follows one step commands inconsistently   Awareness: Intellectual Problem Solving: Decreased initiation;Slow processing;Difficulty sequencing;Requires verbal cues;Requires tactile cues General Comments: Pt with no verbalizations; grunting in response to pain. Waved initially with PT introduction; some other purposeful (?) automatic BUE movement with mobility (i.e. reaching to bed rail), but inconsistent. Pt inconsistently following simple commands. Keeping eyes close majority of session requiring frequent stimulus to re-arouse during session. R-side gaze preference able to minimally cross midline with  max cues      Exercises Other Exercises Other Exercises: Pt grimacing with PROM to R elbow flex/ext, L knee flex/ext and L hip flexion. Bilateral prevalon boots reapplied. Bilateral hips positioned to encourage hip IR    General Comments General comments (skin integrity, edema, etc.): HR 67, post-session BP 125/69, SpO2 96-98% on  RA; pt with heavy mouth breathing during activity, would close mouth with simple command but then immediately re-open. Mouth moisturizer applied to lips/tongue due to visible dryness. RN redressed sacral wound during session as prior dressing soiled from bowel incontinence. Discussed/reinforced turning schedule with NT Lanora Manis)      Pertinent Vitals/Pain Pain Assessment: Faces Faces Pain Scale: Hurts even more Pain Location: Grimacing with PROM to RUE and LLE Pain Descriptors / Indicators: Grimacing;Moaning;Guarding Pain Intervention(s): Monitored during session;Limited activity within patient's tolerance;Repositioned (RN to provide pain meds)    Home Living                      Prior Function            PT Goals (current goals can now be found in the care plan section) Acute Rehab PT Goals Patient Stated Goal: none stated; per chart, pt's son wants to continue with full scope of care PT Goal Formulation: Patient unable to participate in goal setting Time For Goal Achievement: 06/12/20 Potential to Achieve Goals: Poor Progress towards PT goals: Not progressing toward goals - comment (limited by cognitive impairment, lethargy/fatigue)    Frequency    Min 2X/week      PT Plan Current plan remains appropriate    Co-evaluation              AM-PAC PT "6 Clicks" Mobility   Outcome Measure  Help needed turning from your back to your side while in a flat bed without using bedrails?: A Lot Help needed moving from lying on your back to sitting on the side of a flat bed without using bedrails?: Total Help needed moving to and from a bed to a chair (including a wheelchair)?: Total Help needed standing up from a chair using your arms (e.g., wheelchair or bedside chair)?: Total Help needed to walk in hospital room?: Total Help needed climbing 3-5 steps with a railing? : Total 6 Click Score: 7    End of Session   Activity Tolerance: Patient limited by  fatigue Patient left: in bed;with call bell/phone within reach;with bed alarm set;with SCD's reapplied;with restraints reapplied Nurse Communication: Mobility status;Need for lift equipment;Other (comment) (turning schedule) PT Visit Diagnosis: Muscle weakness (generalized) (M62.81);Other abnormalities of gait and mobility (R26.89);Other symptoms and signs involving the nervous system (R29.898)     Time: 3382-5053 PT Time Calculation (min) (ACUTE ONLY): 40 min  Charges:  $Therapeutic Activity: 38-52 mins                    Ina Homes, PT, DPT Acute Rehabilitation Services  Pager [phone] Office [phone]  Malachy Chamber 05/29/2020, 5:40 PM

## 2020-05-29 NOTE — Consult Note (Signed)
[facility] Consult Note  Jason [name] [birth year]  [id number].    Requesting MD: Delia Heady Chief Complaint/Reason for Consult: PEG  HPI:  Jason [name] is a 70yo male PMH DM, previous CVA, hx CAD/ MI/ CABG, and Afib (on eliquis prior to admission) who was admitted to [facility] 05/11/2020 with altered mental status and hypertensive emergency. He was found to have hemorrhage of the left thalamus with intro ventricular extension. Eliquis was reversed with Kcentra. He was initially intubated and managed in the ICU. He has since been extubated and transferred to the floor. He has been working with therapies but remains NPO due to dysphagia and is receiving tube feedings for nutrition via Cortrak. Trauma asked to see today for PEG tube placement. Palliative team is following and patient's family desires full support and aggressive care plan.  Abdominal surgical history: cholecystectomy  Review of Systems  Unable to perform ROS: Patient nonverbal   No family history on file.  Past Medical History:  Diagnosis Date  . Anxiety   . Coronary artery disease   . Depression   . Diabetes mellitus without complication (HCC)   . Dysrhythmia   . Hypertension   . Myocardial infarction (HCC)   . Stroke [facility])     Past Surgical History:  Procedure Laterality Date  . CORONARY ARTERY BYPASS GRAFT      Social History:  reports that he has never smoked. He has never used smokeless tobacco. He reports previous alcohol use. He reports previous drug use.  Allergies: No Known Allergies  Medications Prior to Admission  Medication Sig Dispense Refill  . acetaminophen (TYLENOL) 650 MG CR tablet Take 650 mg by mouth every 8 (eight) hours as needed for pain.    . benazepril (LOTENSIN) 40 MG tablet Take 1 tablet (40 mg total) by mouth daily. 30 tablet 1  . buPROPion (WELLBUTRIN) 75 MG tablet Take 1 tablet (75 mg total) by mouth daily. 30 tablet 1  . docusate sodium (COLACE) 100 MG capsule Take 100 mg by  mouth daily.    Marland Kitchen doxycycline (VIBRAMYCIN) 100 MG capsule Take 100 mg by mouth 2 (two) times daily. Start date: 05/09/20    . ELIQUIS 5 MG TABS tablet Take 1 tablet (5 mg total) by mouth 2 (two) times daily. 60 tablet 1  . Insulin Glargine (BASAGLAR KWIKPEN) 100 UNIT/ML Inject 35 Units into the skin daily. ON Hold per [facility] (Patient taking differently: Inject 20 Units into the skin at bedtime.) 15 mL 1  . insulin lispro (HUMALOG) 100 UNIT/ML KwikPen Inject 2-12 Units into the skin 3 (three) times daily. Sliding scale:   0-150 = 2 units 151-200 = 4 units 201-250 = 6 units 251-300 = 8 units 301-350 = 10 units 351-400 = 12 units    . melatonin 3 MG TABS tablet Take 3 mg by mouth at bedtime.    . metoprolol tartrate (LOPRESSOR) 50 MG tablet Take 1 tablet (50 mg total) by mouth 2 (two) times daily. 60 tablet 0  . ondansetron (ZOFRAN) 4 MG tablet Take 4 mg by mouth every 8 (eight) hours as needed for nausea or vomiting.    . polyethylene glycol (MIRALAX / GLYCOLAX) 17 g packet Take 17 g by mouth daily.    . rosuvastatin (CRESTOR) 20 MG tablet Take 1 tablet (20 mg total) by mouth daily. 30 tablet 0  . aspirin EC 81 MG tablet Take 1 tablet (81 mg total) by mouth daily. (Patient not taking: Reported on 05/11/2020) 30 tablet  11  . insulin lispro (HUMALOG) 100 UNIT/ML injection Inject 0.02-0.12 mLs (2-12 Units total) into the skin 3 (three) times daily with meals. Based on Sliding scale (Patient not taking: Reported on 05/11/2020) 10 mL 11  . lidocaine (LIDODERM) 5 % Place 2 patches onto the skin daily. Remove & Discard patch within 12 hours or as directed by MD (Patient not taking: Reported on 05/11/2020) 30 patch 1    Prior to Admission medications   Medication Sig Start Date End Date Taking? Authorizing Provider  acetaminophen (TYLENOL) 650 MG CR tablet Take 650 mg by mouth every 8 (eight) hours as needed for pain.   Yes [provider]  benazepril (LOTENSIN) 40 MG tablet Take 1 tablet (40 mg  total) by mouth daily. 03/08/20 05/07/20 Yes Steffanie Rainwater, MD  buPROPion (WELLBUTRIN) 75 MG tablet Take 1 tablet (75 mg total) by mouth daily. 03/09/20 05/08/20 Yes Amponsah, Flossie Buffy, MD  docusate sodium (COLACE) 100 MG capsule Take 100 mg by mouth daily.   Yes [provider]  doxycycline (VIBRAMYCIN) 100 MG capsule Take 100 mg by mouth 2 (two) times daily. Start date: 05/09/20   Yes [provider]  ELIQUIS 5 MG TABS tablet Take 1 tablet (5 mg total) by mouth 2 (two) times daily. 03/08/20 05/07/20 Yes Amponsah, Flossie Buffy, MD  Insulin Glargine [facility] KWIKPEN) 100 UNIT/ML Inject 35 Units into the skin daily. ON Hold per [facility] Patient taking differently: Inject 20 Units into the skin at bedtime. 03/08/20  Yes Amponsah, Flossie Buffy, MD  insulin lispro (HUMALOG) 100 UNIT/ML KwikPen Inject 2-12 Units into the skin 3 (three) times daily. Sliding scale:   0-150 = 2 units 151-200 = 4 units 201-250 = 6 units 251-300 = 8 units 301-350 = 10 units 351-400 = 12 units   Yes [provider]  melatonin 3 MG TABS tablet Take 3 mg by mouth at bedtime.   Yes [provider]  metoprolol tartrate (LOPRESSOR) 50 MG tablet Take 1 tablet (50 mg total) by mouth 2 (two) times daily. 03/08/20 04/07/20 Yes Steffanie Rainwater, MD  ondansetron (ZOFRAN) 4 MG tablet Take 4 mg by mouth every 8 (eight) hours as needed for nausea or vomiting.   Yes [provider]  polyethylene glycol (MIRALAX / GLYCOLAX) 17 g packet Take 17 g by mouth daily.   Yes [provider]  rosuvastatin (CRESTOR) 20 MG tablet Take 1 tablet (20 mg total) by mouth daily. 03/09/20 04/08/20 Yes Steffanie Rainwater, MD  aspirin EC 81 MG tablet Take 1 tablet (81 mg total) by mouth daily. Patient not taking: Reported on 05/11/2020 03/08/20   Steffanie Rainwater, MD  insulin lispro (HUMALOG) 100 UNIT/ML injection Inject 0.02-0.12 mLs (2-12 Units total) into the skin 3 (three) times daily with meals. Based  on Sliding scale Patient not taking: Reported on 05/11/2020 03/08/20   Steffanie Rainwater, MD  lidocaine (LIDODERM) 5 % Place 2 patches onto the skin daily. Remove & Discard patch within 12 hours or as directed by MD Patient not taking: Reported on 05/11/2020 03/09/20   Steffanie Rainwater, MD    Blood pressure 117/75, pulse 68, temperature 97.7 F (36.5 C), temperature source Axillary, resp. rate (!) 24, height 5\' 10"  (1.778 m), weight 98 kg, SpO2 100 %. Physical Exam: General: elderly male who is laying in bed in NAD HEENT: head is normocephalic, atraumatic.  Sclera are noninjected.  PERRL.  Ears and nose without any masses or lesions.  Mouth  is pink and moist. Dentition fair. Cortrak in nare Heart: irregular. No obvious murmurs, gallops, or rubs noted.  Palpable pedal pulses bilaterally  Lungs: CTAB, no wheezes, rhonchi, or rales noted.  Respiratory effort nonlabored Abd: well healed laparoscopic incisions, soft, NT/ND, +BS, no masses, hernias, or organomegaly MS: no BUE/BLE edema, calves soft and nontender Skin: warm and dry with no masses, lesions, or rashes Psych: nonverbal, unable to assess Neuro: lethargic, opens eyes to verbal stimulus  Results for orders placed or performed during the hospital encounter of 05/11/20 (from the past 48 hour(s))  Glucose, capillary     Status: Abnormal   Collection Time: 05/27/20  4:45 PM  Result Value Ref Range   Glucose-Capillary 151 (H) 70 - 99 mg/dL    Comment: Glucose reference range applies only to samples taken after fasting for at least 8 hours.  Glucose, capillary     Status: Abnormal   Collection Time: 05/27/20  8:20 PM  Result Value Ref Range   Glucose-Capillary 145 (H) 70 - 99 mg/dL    Comment: Glucose reference range applies only to samples taken after fasting for at least 8 hours.  Glucose, capillary     Status: Abnormal   Collection Time: 05/28/20 12:00 AM  Result Value Ref Range   Glucose-Capillary 110 (H) 70 - 99 mg/dL     Comment: Glucose reference range applies only to samples taken after fasting for at least 8 hours.  Comprehensive metabolic panel     Status: Abnormal   Collection Time: 05/28/20  4:04 AM  Result Value Ref Range   Sodium 150 (H) 135 - 145 mmol/L   Potassium 4.0 3.5 - 5.1 mmol/L   Chloride 114 (H) 98 - 111 mmol/L   CO2 26 22 - 32 mmol/L   Glucose, Bld 62 (L) 70 - 99 mg/dL    Comment: Glucose reference range applies only to samples taken after fasting for at least 8 hours.   BUN 27 (H) 8 - 23 mg/dL   Creatinine, Ser 1.610.88 0.61 - 1.24 mg/dL   Calcium 8.3 (L) 8.9 - 10.3 mg/dL   Total Protein 6.7 6.5 - 8.1 g/dL   Albumin 2.1 (L) 3.5 - 5.0 g/dL   AST 42 (H) 15 - 41 U/L   ALT 67 (H) 0 - 44 U/L   Alkaline Phosphatase 68 38 - 126 U/L   Total Bilirubin 0.5 0.3 - 1.2 mg/dL   GFR, Estimated >09>60 >60>60 mL/min    Comment: (NOTE) Calculated using the CKD-EPI Creatinine Equation (2021)    Anion gap 10 5 - 15    Comment: Performed at [facility], [address] [place], [place] [address]  Phosphorus     Status: Abnormal   Collection Time: 05/28/20  4:04 AM  Result Value Ref Range   Phosphorus 4.9 (H) 2.5 - 4.6 mg/dL    Comment: Performed at [facility], [address] [place], [place] [address]  Magnesium     Status: Abnormal   Collection Time: 05/28/20  4:04 AM  Result Value Ref Range   Magnesium 2.6 (H) 1.7 - 2.4 mg/dL    Comment: Performed at [facility], [address] [place], [place] [address]  CBC     Status: Abnormal   Collection Time: 05/28/20  4:04 AM  Result Value Ref Range   WBC 10.6 (H) 4.0 - 10.5 K/uL   RBC 5.26 4.22 - 5.81 MIL/uL   Hemoglobin 14.7 13.0 - 17.0 g/dL  HCT 47.7 39.0 - 52.0 %   MCV 90.7 80.0 - 100.0 fL   MCH 27.9 26.0 - 34.0 pg   MCHC 30.8 30.0 - 36.0 g/dL   RDW 78.2 95.6 - 21.3 %   Platelets 263 150 - 400 K/uL   nRBC 0.0 0.0 - 0.2 %    Comment: Performed at [facility], [address] [place], [place] [address]  Glucose,  capillary     Status: None   Collection Time: 05/28/20  4:19 AM  Result Value Ref Range   Glucose-Capillary 76 70 - 99 mg/dL    Comment: Glucose reference range applies only to samples taken after fasting for at least 8 hours.  Glucose, capillary     Status: Abnormal   Collection Time: 05/28/20  8:01 AM  Result Value Ref Range   Glucose-Capillary 152 (H) 70 - 99 mg/dL    Comment: Glucose reference range applies only to samples taken after fasting for at least 8 hours.  Glucose, capillary     Status: Abnormal   Collection Time: 05/28/20 12:01 PM  Result Value Ref Range   Glucose-Capillary 169 (H) 70 - 99 mg/dL    Comment: Glucose reference range applies only to samples taken after fasting for at least 8 hours.  Glucose, capillary     Status: None   Collection Time: 05/28/20  4:04 PM  Result Value Ref Range   Glucose-Capillary 97 70 - 99 mg/dL    Comment: Glucose reference range applies only to samples taken after fasting for at least 8 hours.  Glucose, capillary     Status: Abnormal   Collection Time: 05/28/20  7:56 PM  Result Value Ref Range   Glucose-Capillary 119 (H) 70 - 99 mg/dL    Comment: Glucose reference range applies only to samples taken after fasting for at least 8 hours.  Glucose, capillary     Status: Abnormal   Collection Time: 05/29/20 12:22 AM  Result Value Ref Range   Glucose-Capillary 188 (H) 70 - 99 mg/dL    Comment: Glucose reference range applies only to samples taken after fasting for at least 8 hours.  Glucose, capillary     Status: Abnormal   Collection Time: 05/29/20  4:11 AM  Result Value Ref Range   Glucose-Capillary 159 (H) 70 - 99 mg/dL    Comment: Glucose reference range applies only to samples taken after fasting for at least 8 hours.  Glucose, capillary     Status: Abnormal   Collection Time: 05/29/20  7:47 AM  Result Value Ref Range   Glucose-Capillary 163 (H) 70 - 99 mg/dL    Comment: Glucose reference range applies only to samples taken after  fasting for at least 8 hours.  Glucose, capillary     Status: Abnormal   Collection Time: 05/29/20 12:00 PM  Result Value Ref Range   Glucose-Capillary 186 (H) 70 - 99 mg/dL    Comment: Glucose reference range applies only to samples taken after fasting for at least 8 hours.   DG Chest Port 1 View  Result Date: 05/27/2020 CLINICAL DATA:  Tachypnea EXAM: PORTABLE CHEST 1 VIEW COMPARISON:  05/20/2020 FINDINGS: Interval extubation. Enteric tube terminates within the stomach. Post CABG changes. Stable cardiomediastinal contours. Atherosclerotic calcification of the aortic knob. Minimal streaky left basilar opacity, likely atelectasis. No pleural effusion or pneumothorax. Remote right-sided rib fractures. IMPRESSION: Interval extubation.  Otherwise, no significant interval change. Electronically Signed   By: Duanne Guess D.O.   On: 05/27/2020  18:09   Anti-infectives (From admission, onward)   Start     Dose/Rate Route Frequency Ordered Stop   05/21/20 1100  vancomycin (VANCOREADY) IVPB 1750 mg/350 mL  Status:  Discontinued        1,750 mg 175 mL/hr over 120 Minutes Intravenous Every 24 hours 05/20/20 0953 05/23/20 1046   05/20/20 1045  vancomycin (VANCOREADY) IVPB 2000 mg/400 mL        2,000 mg 200 mL/hr over 120 Minutes Intravenous  Once 05/20/20 0948 05/20/20 1341   05/19/20 1015  cefTRIAXone (ROCEPHIN) 2 g in sodium chloride 0.9 % 100 mL IVPB  Status:  Discontinued        2 g 200 mL/hr over 30 Minutes Intravenous Every 24 hours 05/18/20 0958 05/18/20 1132   05/18/20 1400  piperacillin-tazobactam (ZOSYN) IVPB 3.375 g  Status:  Discontinued        3.375 g 12.5 mL/hr over 240 Minutes Intravenous Every 8 hours 05/18/20 1132 05/23/20 1046   05/17/20 1015  cefTRIAXone (ROCEPHIN) 1 g in sodium chloride 0.9 % 100 mL IVPB  Status:  Discontinued        1 g 200 mL/hr over 30 Minutes Intravenous Every 24 hours 05/17/20 0918 05/18/20 0958   05/12/20 1530  ceFAZolin (ANCEF) IVPB 1 g/50 mL premix   Status:  Discontinued        1 g 100 mL/hr over 30 Minutes Intravenous Every 8 hours 05/12/20 1440 05/17/20 0918        Assessment/Plan DM, Previous CVA Hx CAD/ MI/ CABG Afib HLD  ICH - left thalamic ICH with IVH Dysphagia - Trauma team asked to see regarding PEG tube placement. Palliative team is following and patient's family desires full support and aggressive care plan. I called and spoke with the patient's son, Jason [name], who wishes to proceed with PEG placement. Will plan for procedure tomorrow in endoscopy. Hold tube feedings at midnight.   ID - none currently VTE - SCDs, lovenox FEN - TF Foley - none   Franne Forts, PA-C [facility] 05/29/2020, 3:59 PM Please see Amion for pager number during day hours 7:00am-4:30pm

## 2020-05-29 NOTE — TOC Initial Note (Signed)
Transition of Care [facility]) - Initial/Assessment Note    Patient Details  Name: Jason [name] MRN: [id number] Date of Birth: March 07, 1951  [facility]) CM/SW Contact:    Terrial Rhodes, LCSWA Phone Number: 05/29/2020, 2:54 PM  Clinical Narrative:                  CSW received consult for possible SNF placement at time of discharge. CSW spoke with patients son Jason [name] regarding PT recommendation of SNF placement at time of discharge. Patient comes from [facility] term.Patients son expressed understanding of PT recommendation and is agreeable to SNF placement at time of discharge. Patients son is agreeable to patient returning to [place] when medically ready. Patient has received the COVID vaccines as well as the booster. Patient expressed being hopeful for rehab and to feel better soon. No further questions reported at this time. CSW to continue to follow and assist with discharge planning needs.  Expected Discharge Plan: Skilled Nursing Facility Barriers to Discharge: Continued Medical Work up   Patient Goals and CMS Choice   CMS Medicare.gov Compare Post Acute Care list provided to:: Patient Represenative (must comment) (Son Jason [name]) Choice offered to / list presented to : Adult Children (Son Jason [name])  Expected Discharge Plan and Services Expected Discharge Plan: Skilled Nursing Facility In-house Referral: Clinical Social Work     Living arrangements for the past 2 months: Skilled Nursing Facility                                      Prior Living Arrangements/Services Living arrangements for the past 2 months: Skilled Nursing Facility Lives with:: Self,Facility Resident (from [facility]) Patient language and need for interpreter reviewed:: Yes Do you feel safe going back to the place where you live?: Yes      Need for Family Participation in Patient Care: Yes (Comment) Care giver support system in place?: Yes (comment)   Criminal  Activity/Legal Involvement Pertinent to Current Situation/Hospitalization: No - Comment as needed  Activities of Daily Living Home Assistive Devices/Equipment: Dan Humphreys (specify type) (per previous hx) ADL Screening (condition at time of admission) Patient's cognitive ability adequate to safely complete daily activities?: No Is the patient deaf or have difficulty hearing?: No Does the patient have difficulty seeing, even when wearing glasses/contacts?: No Does the patient have difficulty concentrating, remembering, or making decisions?: Yes Patient able to express need for assistance with ADLs?: No Does the patient have difficulty dressing or bathing?: Yes Independently performs ADLs?: No Communication: Needs assistance Is this a change from baseline?: Change from baseline, expected to last >3 days Dressing (OT): Needs assistance Is this a change from baseline?: Change from baseline, expected to last >3 days Grooming: Needs assistance Is this a change from baseline?: Change from baseline, expected to last >3 days Feeding: Needs assistance Is this a change from baseline?: Change from baseline, expected to last >3 days Bathing: Needs assistance Is this a change from baseline?: Change from baseline, expected to last >3 days Toileting: Needs assistance Is this a change from baseline?: Change from baseline, expected to last >3days In/Out Bed: Needs assistance Is this a change from baseline?: Change from baseline, expected to last >3 days Walks in Home: Needs assistance Is this a change from baseline?: Change from baseline, expected to last >3 days Does the patient have difficulty walking or climbing stairs?: Yes Weakness of Legs: Both  Weakness of Arms/Hands: Both  Permission Sought/Granted Permission sought to share information with : Case Manager,Family Electrical engineer Permission granted to share information with : Yes, Verbal Permission Granted     Permission  granted to share info w AGENCY: SNF        Emotional Assessment       Orientation: :  (follows commands) Alcohol / Substance Use: Not Applicable Psych Involvement: No (comment)  Admission diagnosis:  Intracranial hemorrhage (HCC) [I62.9] ICH (intracerebral hemorrhage) (HCC) [I61.9] Altered mental status, unspecified altered mental status type [R41.82] Patient Active Problem List   Diagnosis Date Noted  . Atrial flutter (HCC)   . Altered mental status   . Encounter for intubation   . Hypertensive emergency   . IVH (intraventricular hemorrhage) (HCC) 05/11/2020  . Pressure injury of skin 03/02/2020  . DKA, type 2 (HCC) 02/27/2020  . Paroxysmal A-fib (HCC) 02/27/2020  . Elevated troponin 02/27/2020  . CAD (coronary artery disease), native coronary artery 02/27/2020  . Hyperosmolar hyperglycemic state (HHS) (HCC) 02/26/2020  . Essential hypertension 10/29/2019  . Type 2 diabetes mellitus (HCC) 10/29/2019   PCP:  Sherron Monday, MD Pharmacy:   [facility] - [place], [place] - [facility] [place] [address] Phone: [phone] Fax: [phone]     Social Determinants of Health (SDOH) Interventions    Readmission Risk Interventions No flowsheet data found.

## 2020-05-29 NOTE — Progress Notes (Signed)
  Speech Language Pathology Treatment: Dysphagia  Patient Details Name: Wasif Simonich MRN: [id number] DOB: 1951/01/31 Today's Date: 05/29/2020 Time: 3810-1751 SLP Time Calculation (min) (ACUTE ONLY): 19 min  Assessment / Plan / Recommendation Clinical Impression  Pt demonstrates ongoing lethargy, though he was able to open eyes, attempt communication via head nod/shake and minimal phonation. Primary goal of session was to improve oral hygiene and extensive debridement of dried secretions on tongue and palate did improve pts status by end of session. After oral care SLP attempted trials of ice and teaspoons of water. Initially pt did orally manipulate ice with a swallow and immediate cough, but with further attempts pt became less and less alert and though he swallowed at times, no coughing occurred, raining increased concern for silent aspiration. Recommend pt remain NPO though will continue efforts.   HPI HPI: 70yo male admitted 05/11/20 with AMS. PMH: HTN, DM, CVA, CAD, MI, CABG, AFib, anxiety/depression, recent fall (02/2020) with DKA/HHS, memory impairment.  CTHead - hemorrhage with intraventricular extension and possibly mildly increased ventricular size. Intubated 2/25 - 3/1.Evaluated 3/2, on 3/4 recommended full liquids, pt passed [place], worsening mental status, reintubated 3/5-3/8.      SLP Plan  Continue with current plan of care       Recommendations  Diet recommendations: NPO                Oral Care Recommendations: Oral care QID Follow up Recommendations: Skilled Nursing facility Plan: Continue with current plan of care       GO                Revia Nghiem, Riley Nearing 05/29/2020, 12:26 PM

## 2020-05-29 NOTE — Progress Notes (Signed)
PROGRESS NOTE    Jason [name]   PYY:[id number]  DOB: 12-Jul-1950  PCP: Jodi Marble, MD    DOA: 05/11/2020 LOS: 86   Brief Narrative   70 year old M with PMH of CAD, DM-2, PAF on Eliquis, CVA, HTN and systolic CHF admitted to ICU on 05/11/2020 with >1.2cm thalamic hemorrhage with intraventricular extension in the setting of hypertensive emergency.  He received Kcentra for Eliquis reversal.  He was intubated and had external ventricular drain placed on 05/12/2020.  He was extubated on 05/16/2020.  EVD dislodged on 05/20/2020.  He was reintubated on 3/5, and eventually extubated to BiPAP on 3/8.  He was transferred to [facility] service on 3/12 on room air but persistent encephalopathy/semi-vegetative state.  Palliative care consulted.  Family desire to give patient more time to see if he will improve, and want PEG tube placed for nutrition.  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  Barely opens his eyes in response to noxious stimuli much of the time 3/14 - did open both eyes on command and grip my fingers but non verbal and no other purposeful interaction.  Appears in mild respiratory distress and having secretions -In the setting of hypertensive emergency (BP 205/120 on admit) and anticoagulation for A. Fib -Received KCentra for Eliquis reversal in ED -External ventricular drain from 2/25-3/5.  -EEG with generalized slowing consistent with moderate to severe diffuse encephalopathy -Continue Keppra for seizure prophylaxis -Continue tube feeds via cortrak  -PEG placement is pending, surgery consulted 3/14 -Neurology following and consulted palliative  medicine. -Therapy recommended SNF  Acute respiratory failure with hypoxia in the setting of the above: ntubation and mechanical ventilation from 2/25-3/8.  Has been on room air but is tachypneic with shallow breaths and some work of breathing, appears not managing his own secretions well at times per nursing.  He is high risk for reintubation. -Aspiration precautions and pulmonary hygiene  Persistent A. fib/A flutter: in A flutter with rate in 60s this morning. Was on Eliquis POA. -Continue holding Eliquis -Continue Cardizem and Lopressor per tube -No anticoagulation in the setting of ICH -Optimize K and Mg  Chronic systolic CHF: Stable.  LVEF 45 to 50%.  He has some dependent edema but no overt fluid overload. -Monitor fluid status and respiratory status -Not on diuretics.  Hypernatremia: Na 153>> 150.  Improved. -Increase free water to 250 cc every 3 hours on 3/12. -Recheck in the morning  Hypertensive emergency/uncontrolled hypertension: BP 205/120 on admit.  Now normotensive. -Continue Cardizem 90 mg every 6 hours -Continue metoprolol tartrate 100 mg 3 times daily -Scheduled hydralazine at 25 mg every 8 hours on 3/12 -Continue as needed labetalol with parameters  History of CAD: Stable -Cardiac meds as above.  Uncontrolled IDDM-2: A1c 9.4% -Continue Lantus 18 units twice daily -Tube feed Novolog coverage 2 units q4h -Resistant sliding scale Novolog -Continue statin   Physical deconditioning -Continue PT/OT/SLP as much as possible  Goal of care: Significant comorbidity as above, and very poor prognosis.  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  He is a still full code.  My partner Dr. Cyndia Skeeters: "discussed about his CODE STATUS and prognosis with patient's son, [name] over the phone.  I have also  discussed the pros and cons of CPR and intubation, and recommended against those aggressive interventions. [name] wants full code with full scope is of care including tracheostomy and  feeding tube if necessary. He says "I want him to give him a chance".  -Palliative care team also in [place] discussions with family -Full scope of care, treat the treatable, address nutrition  Nutrition-continue tube feed via cortrak for now.  PEG tube placement is pending - surgery consulted Body mass index is 31 kg/m. Nutrition Problem: Inadequate oral intake Etiology: inability to eat Signs/Symptoms: NPO status Interventions: Tube feeding,Prostat   Pressure skin injury: Stage II Pressure Injury 05/24/20 Buttocks Bilateral;Medial Deep Tissue Pressure Injury - Purple or maroon localized area of discolored intact skin or blood-filled blister due to damage of underlying soft tissue from pressure and/or shear. Multiple blisters along (Active)  05/24/20 0800  Location: Buttocks  Location Orientation: Bilateral;Medial  Staging: Deep Tissue Pressure Injury - Purple or maroon localized area of discolored intact skin or blood-filled blister due to damage of underlying soft tissue from pressure and/or shear.  Wound Description (Comments): Multiple blisters along with deep tissue injury.  Present on Admission:      Obesity: Body mass index is 31 kg/m.  Complicates overall care and prognosis.  DVT prophylaxis: enoxaparin (LOVENOX) injection 40 mg Start: 05/15/20 1130 SCD's Start: 05/11/20 2335   Diet:  Diet Orders (From admission, onward)    Start     Ordered   05/23/20 1053  Diet NPO time specified  Diet effective now        05/23/20 1052            Code Status: Full Code    Subjective 05/29/20    Pt appears sleeping, opens eyes to voice, squeezes my fingers with each of his hands.  Non verbal.  RN at bedside.  Pt having coarse secretion sounds and rapid respirations.  Maintains oxygen saturations.  Discussed case with palliative care team and neurology. Surgery consulted for PEG tube placement.   Disposition Plan & Communication   Status is: Inpatient  Inpatient status  is appropriate due to severity of illness, requiring very close monitoring.  PEG tube placement is pending.  Dispo: The patient is from: Home              Anticipated d/c is to: SNF              Patient currently is not medically stable for d/c   Difficult to place patient - No   Consults, Procedures, Significant Events   Consultants:  Neurosurgery Intensivist Cardiology Neurology Palliative medicine  Procedures:  ETT 2/25 >> 3/1, 3/5 >> 3/8 EVD 2/25 >> 3/5  Antimicrobials:  Anti-infectives (From admission, onward)   Start     Dose/Rate Route Frequency Ordered Stop   05/21/20 1100  vancomycin (VANCOREADY) IVPB 1750 mg/350 mL  Status:  Discontinued        1,750 mg 175 mL/hr over 120 Minutes Intravenous Every 24 hours 05/20/20 0953 05/23/20 1046   05/20/20 1045  vancomycin (VANCOREADY) IVPB 2000 mg/400 mL        2,000 mg 200 mL/hr over 120 Minutes Intravenous  Once 05/20/20 0948 05/20/20 1341   05/19/20 1015  cefTRIAXone (ROCEPHIN) 2 g in sodium chloride 0.9 % 100 mL IVPB  Status:  Discontinued        2 g 200 mL/hr over 30 Minutes Intravenous Every 24 hours 05/18/20 0958 05/18/20 1132   05/18/20 1400  piperacillin-tazobactam (ZOSYN) IVPB  3.375 g  Status:  Discontinued        3.375 g 12.5 mL/hr over 240 Minutes Intravenous Every 8 hours 05/18/20 1132 05/23/20 1046   05/17/20 1015  cefTRIAXone (ROCEPHIN) 1 g in sodium chloride 0.9 % 100 mL IVPB  Status:  Discontinued        1 g 200 mL/hr over 30 Minutes Intravenous Every 24 hours 05/17/20 0918 05/18/20 0958   05/12/20 1530  ceFAZolin (ANCEF) IVPB 1 g/50 mL premix  Status:  Discontinued        1 g 100 mL/hr over 30 Minutes Intravenous Every 8 hours 05/12/20 1440 05/17/20 0918        Micro    Objective   Vitals:   05/29/20 1129 05/29/20 1224 05/29/20 1609 05/29/20 1647  BP: (!) 157/75 117/75 131/78 125/69  Pulse: 83 68 68   Resp:  (!) 24 (!) 22   Temp:  97.7 F (36.5 C) 97.8 F (36.6 C)   TempSrc:  Axillary  Axillary   SpO2:  100% 100%   Weight:      Height:        Intake/Output Summary (Last 24 hours) at 05/29/2020 1732 Last data filed at 05/29/2020 1500 Gross per 24 hour  Intake 1370 ml  Output 1525 ml  Net -155 ml   Filed Weights   05/26/20 0417 05/27/20 0414 05/28/20 0433  Weight: 94.9 kg 93.6 kg 98 kg    Physical Exam:  General exam: appears sleeping, responds to voice and tactile stimulation, no acute distress HEENT: clear conjunctiva, anicteric sclera, moist mucus membranes Respiratory system: referred coarse upper airway secretion sounds, otherwise clear but exam limited, tachypnea, no wheezes. Cardiovascular system: normal S1/S2, RRR   Gastrointestinal system: soft, NT, ND, +bowel sounds. Central nervous system: limited evaluation, unable to assess orientation as pt non-verbal, open eyes and tracks, squeezes fingers on command with bilateral hands Extremities: offloading boots on feet b/l, R great toe amputated, onnychomychosis Skin: dry, intact, normal temperature, no rashes seen on visualized skin Psychiatry: unable to evaluate due to pt's condition  Labs   Data Reviewed: I have personally reviewed following labs and imaging studies  CBC: Recent Labs  Lab 05/23/20 0333 05/24/20 0327 05/24/20 1200 05/25/20 0313 05/26/20 0104 05/28/20 0404  WBC 12.0* 12.2*  --  9.5 8.3 10.6*  HGB 13.7 14.5 12.9* 13.7 13.8 14.7  HCT 44.4 46.2 38.0* 42.6 44.8 47.7  MCV 90.4 90.4  --  89.7 90.5 90.7  PLT 188 211  --  242 256 161   Basic Metabolic Panel: Recent Labs  Lab 05/24/20 0327 05/24/20 1200 05/25/20 0313 05/26/20 0104 05/27/20 0407 05/28/20 0404  NA 148* 151* 151* 151* 153* 150*  K 3.6 3.6 3.8 4.3 3.8 4.0  CL 112*  --  116* 118* 118* 114*  CO2 25  --  _0 GLUCOSE 140*  --  169* 214* 97 62*  BUN 27*  --  27* 27* 26* 27*  CREATININE 0.82  --  0.86 0.82 0.80 0.88  CALCIUM 8.3*  --  8.3* 8.2* 8.4* 8.3*  MG  --   --   --   --   --  2.6*  PHOS  --   --   --    --   --  4.9*   GFR: Estimated Creatinine Clearance: 90.4 mL/min (by C-G formula based on SCr of 0.88 mg/dL). Liver Function Tests: Recent Labs  Lab 05/24/20 0327 05/25/20 0313 05/28/20 0404  AST  72* 83* 42*  ALT 57* 90* 67*  ALKPHOS 63 61 68  BILITOT 0.5 0.5 0.5  PROT 6.5 6.2* 6.7  ALBUMIN 2.2* 2.0* 2.1*   No results for input(s): LIPASE, AMYLASE in the last 168 hours. No results for input(s): AMMONIA in the last 168 hours. Coagulation Profile: No results for input(s): INR, PROTIME in the last 168 hours. Cardiac Enzymes: No results for input(s): CKTOTAL, CKMB, CKMBINDEX, TROPONINI in the last 168 hours. BNP (last 3 results) No results for input(s): PROBNP in the last 8760 hours. HbA1C: No results for input(s): HGBA1C in the last 72 hours. CBG: Recent Labs  Lab 05/29/20 0022 05/29/20 0411 05/29/20 0747 05/29/20 1200 05/29/20 1638  GLUCAP 188* 159* 163* 186* 146*   Lipid Profile: No results for input(s): CHOL, HDL, LDLCALC, TRIG, CHOLHDL, LDLDIRECT in the last 72 hours. Thyroid Function Tests: No results for input(s): TSH, T4TOTAL, FREET4, T3FREE, THYROIDAB in the last 72 hours. Anemia Panel: No results for input(s): VITAMINB12, FOLATE, FERRITIN, TIBC, IRON, RETICCTPCT in the last 72 hours. Sepsis Labs: No results for input(s): PROCALCITON, LATICACIDVEN in the last 168 hours.  No results found for this or any previous visit (from the past 240 hour(s)).    Imaging Studies   No results found.   Medications   Scheduled Meds: . amantadine  100 mg Per Tube BID  . atorvastatin  40 mg Per NG tube q1800  . chlorhexidine gluconate (MEDLINE KIT)  15 mL Mouth Rinse BID  . diltiazem  90 mg Per Tube Q6H  . enoxaparin (LOVENOX) injection  40 mg Subcutaneous Daily  . feeding supplement (PROSource TF)  45 mL Per Tube TID  . free water  250 mL Per Tube Q3H  . hydrALAZINE  25 mg Per Tube Q8H  . insulin aspart  0-20 Units Subcutaneous Q4H  . insulin aspart  2 Units  Subcutaneous Q4H  . insulin glargine  18 Units Subcutaneous BID  . levETIRAcetam  750 mg Per Tube BID  . mouth rinse  15 mL Mouth Rinse q12n4p  . metoprolol tartrate  100 mg Per Tube TID  . pantoprazole sodium  40 mg Per Tube Daily  . thiamine  100 mg Per Tube Daily   Continuous Infusions: . sodium chloride    . feeding supplement (JEVITY 1.5 CAL/FIBER) 1,000 mL (05/27/20 0614)       LOS: 18 days    Time spent: 30 minutes    Ezekiel Slocumb, DO [facility]  05/29/2020, 5:32 PM      If 7PM-7AM, please contact night-coverage. How to contact the [facility] Attending or Consulting provider [place] or covering provider during after hours [place], for this patient?    1. Check the care team in [facility] and look for a) attending/consulting [facility] provider listed and b) the [facility] team listed 2. Log into [email or web address] and use [place]'s universal password to access. If you do not have the password, please contact the hospital operator. 3. Locate the [facility] provider you are looking for under [facility] and page to a number that you can be directly reached. 4. If you still have difficulty reaching the provider, please page the [facility] (Director on Call) for the Hospitalists listed on amion for assistance.

## 2020-05-29 NOTE — Care Management Important Message (Signed)
Important Message  Patient Details  Name: Jason [name] MRN: [id number] Date of Birth: 11/14/1950   Medicare Important Message Given:  Yes     Renie Ora 05/29/2020, 10:19 AM

## 2020-05-29 NOTE — Progress Notes (Signed)
STROKE TEAM PROGRESS NOTE   SUBJECTIVE (INTERVAL HISTORY) Patient is more arousable today follows commands on the right and midline.  Mild increased work of breathing remains when he wakes up.Remains somnolent requiring stimulation to maintain attention.  Hypernatremia persists with Na now down to 150 White count slightly high at 10.6.  Measurement phosphorus are normal.  No family at the bedside.  Signs stable.  CBC    Component Value Date/Time   WBC 10.6 (H) 05/28/2020 0404   RBC 5.26 05/28/2020 0404   HGB 14.7 05/28/2020 0404   HCT 47.7 05/28/2020 0404   PLT 263 05/28/2020 0404   MCV 90.7 05/28/2020 0404   MCH 27.9 05/28/2020 0404   MCHC 30.8 05/28/2020 0404   RDW 14.8 05/28/2020 0404   LYMPHSABS 1.0 05/18/2020 0920   MONOABS 1.3 (H) 05/18/2020 0920   EOSABS 0.0 05/18/2020 0920   BASOSABS 0.1 05/18/2020 0920   BMP Latest Ref Rng & Units 05/28/2020 05/27/2020 05/26/2020  Glucose 70 - 99 mg/dL 62(L) 97 214(H)  BUN 8 - 23 mg/dL 27(H) 26(H) 27(H)  Creatinine 0.61 - 1.24 mg/dL 0.88 0.80 0.82  Sodium 135 - 145 mmol/L 150(H) 153(H) 151(H)  Potassium 3.5 - 5.1 mmol/L 4.0 3.8 4.3  Chloride 98 - 111 mmol/L 114(H) 118(H) 118(H)  CO2 22 - 32 mmol/L _0 Calcium 8.9 - 10.3 mg/dL 8.3(L) 8.4(L) 8.2(L)    IMAGING past 24 hours  EEG adult Result Date: 05/14/2020  IMPRESSION: This study is suggestive of moderate to evere diffuse encephalopathy, nonspecific etiology. No seizures or epileptiform discharges were seen throughout the recording.  PHYSICAL EXAM Temp:  [97.7 F (36.5 C)-99.6 F (37.6 C)] 97.7 F (36.5 C) (03/14 1224) Pulse Rate:  [66-83] 68 (03/14 1224) Resp:  [24-36] 24 (03/14 1224) BP: (117-157)/(64-85) 117/75 (03/14 1224) SpO2:  [97 %-100 %] 100 % (03/14 1224) Essentially  exam not significantly changed from yesterday-at different times as slightly different exam with differing levels of wakefulness. General: Comfortably laying in bed and sleeping. HEENT:  Normocephalic atraumatic Cardiovascular: Irregularly irregular Respiratory: Scattered rales and rhonchi with mildly increased effort of breathing Abdomen: Mildly tender,  extremities warm well perfused  . Neurological Exam Lethargic Arouses easily with stimulation Open eyes Was able to able to follow only occasional midline commands.   Was not able to verbalize his name today Inconsistent blink to threat from both sides Withdraws all 4 extremities slightly to noxious stimuli.  MED LIST  Current Facility-Administered Medications:    0.9 %  sodium chloride infusion, 250 mL, Intravenous, Continuous, Clark, Laura P, DO   acetaminophen (TYLENOL) tablet 650 mg, 650 mg, Oral, Q4H PRN **OR** acetaminophen (TYLENOL) 160 MG/5ML solution 650 mg, 650 mg, Per Tube, Q4H PRN, 650 mg at 05/28/20 2007 **OR** acetaminophen (TYLENOL) suppository 650 mg, 650 mg, Rectal, Q4H PRN, Bhagat, Srishti L, MD, 650 mg at 05/17/20 1441   amantadine (SYMMETREL) 50 MG/5ML solution 100 mg, 100 mg, Per Tube, BID, Cyndia Skeeters, Taye T, MD, 100 mg at 05/29/20 1133   atorvastatin (LIPITOR) tablet 40 mg, 40 mg, Per NG tube, q1800, Olivencia-Simmons, Ivelisse, NP, 40 mg at 05/28/20 1624   chlorhexidine gluconate (MEDLINE KIT) (PERIDEX) 0.12 % solution 15 mL, 15 mL, Mouth Rinse, BID, Clark, Laura P, DO, 15 mL at 05/29/20 0828   diltiazem (CARDIZEM) 10 mg/ml oral suspension 90 mg, 90 mg, Per Tube, Q6H, Blenda Nicely, RPH, 90 mg at 05/29/20 1134   enoxaparin (LOVENOX) injection 40 mg, 40 mg, Subcutaneous, Daily, Leonie Man, Mauriah Mcmillen  S, MD, 40 mg at 05/29/20 1130   feeding supplement (JEVITY 1.5 CAL/FIBER) liquid 1,000 mL, 1,000 mL, Per Tube, Continuous, Garvin Fila, MD, Last Rate: 55 mL/hr at 05/27/20 0614, 1,000 mL at 05/27/20 0614   feeding supplement (PROSource TF) liquid 45 mL, 45 mL, Per Tube, TID, Spero Geralds, MD, 45 mL at 05/29/20 1131   free water 250 mL, 250 mL, Per Tube, Q3H, Wendee Beavers T, MD, 250 mL at 05/29/20  1252   Gerhardt's butt cream, , Topical, PRN, Merlene Laughter F, NP, 1 application at 92/33/00 1437   hydrALAZINE (APRESOLINE) tablet 25 mg, 25 mg, Per Tube, Q8H, Gonfa, Taye T, MD, 25 mg at 05/29/20 0605   insulin aspart (novoLOG) injection 0-20 Units, 0-20 Units, Subcutaneous, Q4H, Magdalen Spatz, NP, 4 Units at 05/29/20 1251   insulin aspart (novoLOG) injection 2 Units, 2 Units, Subcutaneous, Q4H, Gonfa, Taye T, MD, 2 Units at 05/29/20 1251   insulin glargine (LANTUS) injection 18 Units, 18 Units, Subcutaneous, BID, Wendee Beavers T, MD, 18 Units at 05/29/20 1133   labetalol (NORMODYNE) injection 10 mg, 10 mg, Intravenous, Q2H PRN, Garvin Fila, MD, 10 mg at 05/24/20 0932   levETIRAcetam (KEPPRA) 100 MG/ML solution 750 mg, 750 mg, Per Tube, BID, Amie Portland, MD, 750 mg at 05/29/20 1131   MEDLINE mouth rinse, 15 mL, Mouth Rinse, q12n4p, Amie Portland, MD, 15 mL at 05/29/20 1135   metoprolol tartrate (LOPRESSOR) tablet 100 mg, 100 mg, Per Tube, TID, Merlene Laughter F, NP, 100 mg at 05/29/20 1129   morphine 2 MG/ML injection 2 mg, 2 mg, Intravenous, Q4H PRN, Dellinger, Marianne L, PA-C   ondansetron (ZOFRAN) injection 4 mg, 4 mg, Intravenous, Q6H PRN, Kipp Brood, MD, 4 mg at 05/19/20 1339   pantoprazole sodium (PROTONIX) 40 mg/20 mL oral suspension 40 mg, 40 mg, Per Tube, Daily, Noemi Chapel P, DO, 40 mg at 05/29/20 1131   polyethylene glycol (MIRALAX / GLYCOLAX) packet 17 g, 17 g, Per Tube, Daily PRN, Gonfa, Taye T, MD   senna-docusate (Senokot-S) tablet 1 tablet, 1 tablet, Per Tube, BID PRN, Cyndia Skeeters, Taye T, MD   thiamine tablet 100 mg, 100 mg, Per Tube, Daily, Bailey-Modzik, Delila A, NP, 100 mg at 05/29/20 1129   ASSESSMENT/PLAN Mr. Giulian Goldring is a 70 y.o. male with history of HTN, DM, CAD/MI, afib on eliquis presenting with AMS, garbled speech, N/V and behavior change.   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 Including filling both the third ventricle with layering blood in the fourth and lateral ventricle  Repeat CT head 05/21/2020-done for concern for rightward gaze-with no new changes. CTA head and neck with no LVO. Thalamic ICH is resolving and blood seen layered in the posterior horns of lateral ventricles.  2D Echo EF 45-50%, No shunt  LDL 84  HgbA1c 9.4  VTE prophylaxis - SCDs, Lovenox 83m daily  Eliquis (apixaban) daily prior to admission, now on No  antithrombotic  Therapy recommendations:  SNF  Disposition:  SNF pending medical readiness  Son has been difficult to reach (?truckdriver) despite several attempts. He did come in this morning for  discussion as above. Our discussion indicated he will need ongoing support for low health literacy, realistic goal setting and education regarding his father's illness. He wishes to proceed with full treatment.  Discussed with Dr. Cyndia Skeeters who agrees to palliative care consult. I called the consult today. They may not be able to address until tomorrow.  Son was asked to provide additional contact information so he can be reached more readily.   Palliative care consult is Pending  His facility staff reported his baseline functioning was  very slow to speak, able to independently transfer to  chair.   IVH  S/p EVD  2/27 Neurosurgery instilled tPA into EVD  3/1 EVD weaned to 20 cm H20, ICPs stable  3/2 failed clamping trial  3/5 EVD accidentally dislodged. Reintubated.   CT head repeat 3/6 stable, repeat at night due to concern for gaze deviation also stable.   3/7NSG signed off.   Extubated 3/8. 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   Continue tele monitoring   EF 45%   Concern for seizure 3 nights ago. No recurrence   EEG with no evidence of seizures.  Severe diffuse encephalopathy of nonspecific etiology.  Continue Keppra 750 twice daily   HYPERNATREMIA -Likely multifactorial including medicines like Zosyn.  Zosyn discontinued 3/8. Free water was added.  Trending BMP. -Remains hyponatremic with sodium 148->151->151->151-->151 -Continue free water-appreciate CCM management.  ABDOMINAL TENDERNESS -Mild transaminitis.  Abdominal x-ray unremarkable. -If has more tenderness, will consider an ultrasound.  Appreciate CCM assistance.  Respiratory failure and LLL Pneumonia/sinusitis Fever - resolved  Extubated 3/1, vomited post extubation  Tachypnea re-intubated 3/5 and extubated 3/8  CCM on board and appreciated  Empiric antibiotics-completed 5 days of Zosyn.   WBC 8.2  (stable in last 4 days)  Tmax 100.2  Blood cultures 3/4 pending-no growth in 4 days  Appreciate CCM assistance with management  Dysphagia  Likely due to hemorrhagic stroke  Failed swallow eval  On TF @ [id number] on board  Waxing and waning mentation has been prohibitive for       safe swallowing  High aspiration risk   Attempting to reach family has been unsuccessful. Left  voicemail with son today.   SLP unable to assess today due to somnolence.  Hyperlipidemia  Home meds:  crestor 20  LDL 84, goal < 70  May continue statin at discharge  Diabetes type II Uncontrolled with hyperglycemia and episodes of  hpoglycemia.  Home meds:  insulin  HgbA1c 9.4, goal < 7.0  SSI  Appreciate CCM assistance in management  Other Stroke Risk Factors  Advanced Age >/= 6552 Hx stroke/TIA Patient patient clearly will not be able to swallow for a while hence recommend Consulting trauma team to place PEG tube in the next 1 to 2 days as son is quite clear he wants full support and aggressive careplan continue amantadine 100 mg twice daily as I have seen some improvement in his arousal and mental status.  Discussed with medical hospitalist Dr grArbutus Ped  Greater than 50% time during this 25-minute visit was spent on counseling and  coordination of care and discussion with care team.     Antony Contras, MD Medical Director [place] Pager: [phone] 05/29/2020 3:21 PM

## 2020-05-30 DIAGNOSIS — I161 Hypertensive emergency: Secondary | ICD-10-CM | POA: Diagnosis not present

## 2020-05-30 DIAGNOSIS — I615 Nontraumatic intracerebral hemorrhage, intraventricular: Secondary | ICD-10-CM | POA: Diagnosis not present

## 2020-05-30 DIAGNOSIS — R400 Somnolence: Secondary | ICD-10-CM | POA: Diagnosis not present

## 2020-05-30 DIAGNOSIS — I483 Typical atrial flutter: Secondary | ICD-10-CM | POA: Diagnosis not present

## 2020-05-30 LAB — GLUCOSE, CAPILLARY
Glucose-Capillary: 107 mg/dL — ABNORMAL HIGH (ref 70–99)
Glucose-Capillary: 124 mg/dL — ABNORMAL HIGH (ref 70–99)
Glucose-Capillary: 127 mg/dL — ABNORMAL HIGH (ref 70–99)
Glucose-Capillary: 137 mg/dL — ABNORMAL HIGH (ref 70–99)
Glucose-Capillary: 138 mg/dL — ABNORMAL HIGH (ref 70–99)
Glucose-Capillary: 198 mg/dL — ABNORMAL HIGH (ref 70–99)
Glucose-Capillary: 220 mg/dL — ABNORMAL HIGH (ref 70–99)

## 2020-05-30 MED ORDER — FREE WATER
300.0000 mL | Status: DC
  Administered 2020-05-30 – 2020-06-01 (×6): 300 mL

## 2020-05-30 NOTE — Progress Notes (Signed)
  Speech Language Pathology Treatment: Dysphagia  Patient Details Name: Jason [name] MRN: [id number] DOB: December 17, 1950 Today's Date: 05/30/2020 Time: [id number] SLP Time Calculation (min) (ACUTE ONLY): 27 min  Assessment / Plan / Recommendation Clinical Impression  SLP followed up for PO trials. Per RN pt with improved alertness, requesting water this date. Performed diligent oral care. Pt continues to exhibit dried secretions throughout oral cavity and xerostomia. Oral hygiene greatly improved, post oral care. Pt alert though required cueing for following commands. Excellent oral readiness noted with bolus manipulation and mildly prolonged mastication of ice chips. Trialed ice chips, small teaspoon of water, and puree consistencies. PT with suspected delay in swallow initiation per palpation. Vocal quality with low vocal intensity, no overt wetness exhibited. No overt s/sx of aspiration with any PO however risk for silent aspiration remains increased. Recommend FEES to objectively assess swallow safety and efficiency. FEES planned next date as long as pt has adequate mentation. Pt okay for ice chips following oral care as tolerated.      HPI: 70yo male admitted 05/11/20 with AMS. PMH: HTN, DM, CVA, CAD, MI, CABG, AFib, anxiety/depression, recent fall (02/2020) with DKA/HHS, memory impairment.  CTHead - hemorrhage with intraventricular extension and possibly mildly increased ventricular size. Intubated 2/25 - 3/1.Evaluated 3/2, on 3/4 recommended full liquids, pt passed [place], worsening mental status, reintubated 3/5-3/8.      SLP Plan  Continue with current plan of care;Other (Comment) (FEES)       Recommendations  Diet recommendations: NPO; ice chips as tolerated after oral care  Liquids provided via: Teaspoon Medication Administration: Via alternative means                Oral Care Recommendations: Oral care QID;Oral care prior to ice chip/H20 Follow up Recommendations: Skilled Nursing  facility SLP Visit Diagnosis: Dysphagia, oral phase (R13.11);Dysphagia, unspecified (R13.10) Plan: Continue with current plan of care;Other (Comment) (FEES)       GO                Ardyth Gal MA, CCC-SLP Acute Rehabilitation Services   05/30/2020, 12:54 PM

## 2020-05-30 NOTE — Progress Notes (Signed)
PROGRESS NOTE    Jason [name]   XFG:[id number]  DOB: 04-10-1950  PCP: Jodi Marble, MD    DOA: 05/11/2020 LOS: 81   Brief Narrative   70 year old M with PMH of CAD, DM-2, PAF on Eliquis, CVA, HTN and systolic CHF admitted to ICU on 05/11/2020 with >1.2cm thalamic hemorrhage with intraventricular extension in the setting of hypertensive emergency.  He received Kcentra for Eliquis reversal.  He was intubated and had external ventricular drain placed on 05/12/2020.  He was extubated on 05/16/2020.  EVD dislodged on 05/20/2020.  He was reintubated on 3/5, and eventually extubated to BiPAP on 3/8.  He was transferred to [facility] service on 3/12 on room air.  Clinical course since then, patient continues to be persistently encephalopathic, in semi-vegetative state.  Palliative care consulted. Family desire to give patient more time to see if he will improve, thus patient remains full code and full scope of care including placement of PEG tube for nutrition.  3/15 - patient somewhat more alert, SLP plan for FEES tomorrow.  Free water increased.  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  Barely opens his eyes in response to noxious stimuli much of the time 3/14 - did open both eyes on command and grip my fingers but non verbal and no other purposeful interaction.  Appears in mild respiratory distress and having secretions -In the setting of hypertensive emergency and on anticoagulation for A. Fib -Received KCentra for Eliquis reversal in ED -External ventricular drain from 2/25-3/5.  -EEG with generalized slowing consistent with moderate to severe diffuse  encephalopathy -Continue Keppra for seizure prophylaxis -Continue tube feeds via cortrak  -PEG placement is pending, surgery consulted 3/14 -Neurology following  -Palliative care consulted, ongoing [place] discussions with family -Therapy recommended SNF, ongoing PT while here  Acute respiratory failure with hypoxia in the setting of the above: ntubation and mechanical ventilation from 2/25-3/8.  Has been on room air but is tachypneic at times increased work of breathing, appears not managing his own secretions well at times per nursing.   He is high risk for reintubation. -Aspiration precautions and pulmonary hygiene -Monitor closely -Oxygen if needed to keep sat >88%  Persistent A. fib/A flutter: Rate has been controlled mostly in 60's. Was on Eliquis POA. -Continue holding Eliquis -Continue Cardizem and Lopressor per tube -No anticoagulation in the setting of ICH -Replaced K and Mg as needed  Chronic systolic CHF: Stable.  LVEF 45 to 50%.  He has some dependent edema but no overt fluid overload. -Monitor fluid status and respiratory status -Not on diuretics.  -Intermittent diuresis as needed.  Hypernatremia: Na 151 again today.  Improved. -Increase free water to 300 cc every 3 hours on 3/15. -Daily BMP's to monitor   Hypertensive emergency/uncontrolled hypertension:  BP 205/120 on admit.  Now normotensive. -Continue Cardizem 90 mg every 6 hours -Continue metoprolol tartrate 100 mg 3 times daily -Scheduled hydralazine at 25 mg every 8 hours on 3/12 -Continue as needed labetalol with parameters  History of CAD: Stable -Cardiac meds as above.  Uncontrolled IDDM-2: A1c 9.4% -Continue Lantus 18 units twice daily -Tube feed Novolog coverage 2 units q4h -Resistant sliding scale Novolog -Continue statin  Physical  deconditioning -Continue PT/OT/SLP as much as possible  Goal of care: Significant comorbidity as above, and very poor prognosis.  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  He is a still full code.  My partner Dr. Cyndia Skeeters: "discussed about his CODE STATUS and prognosis with patient's son, [name] over the phone.  I have also discussed the pros and cons of CPR and intubation, and recommended against those aggressive interventions. [name] wants full code with full scope is of care including tracheostomy and feeding tube if necessary. He says "I want him to give him a chance".  -Palliative care team also in [place] discussions with family -Full scope of care, treat the treatable, address nutrition  Nutrition-continue tube feed via cortrak for now.  PEG tube placement is pending - surgery consulted Body mass index is 31 kg/m. Nutrition Problem: Inadequate oral intake Etiology: inability to eat Signs/Symptoms: NPO status Interventions: Tube feeding,Prostat   Pressure skin injury: Stage II Pressure Injury 05/24/20 Buttocks Bilateral;Medial Deep Tissue Pressure Injury - Purple or maroon localized area of discolored intact skin or blood-filled blister due to damage of underlying soft tissue from pressure and/or shear. Multiple blisters along (Active)  05/24/20 0800  Location: Buttocks  Location Orientation: Bilateral;Medial  Staging: Deep Tissue Pressure Injury - Purple or maroon localized area of discolored intact skin or blood-filled blister due to damage of underlying soft tissue from pressure and/or shear.  Wound Description (Comments): Multiple blisters along with deep tissue injury.  Present on Admission:      Obesity: Body mass index is 31.57 kg/m.  Complicates overall care and prognosis.  DVT prophylaxis: enoxaparin (LOVENOX) injection 40 mg Start: 05/15/20 1130 SCD's Start: 05/11/20 2335   Diet:  Diet Orders (From admission, onward)    Start     Ordered   05/23/20 1053  Diet NPO time specified  Diet effective now        05/23/20 1052            Code Status: Full Code    Subjective 05/30/20    Pt awake when seen today.  Is verbal today, says  he is thirsty and asks for water to drink.  He denies headache or other pain.  Repeats being thirsty.     Disposition Plan & Communication   Status is: Inpatient  Inpatient status is appropriate due to severity of illness, requiring very close monitoring. Nutrition being addressed, ongoing electrolyte abnormalities.  Dispo: The patient is from: Home              Anticipated d/c is to: SNF              Patient currently is not medically stable for d/c   Difficult to place patient - No   Consults, Procedures, Significant Events   Consultants:  Neurosurgery Intensivist Cardiology Neurology Palliative medicine  Procedures:  ETT 2/25 >> 3/1, 3/5 >> 3/8 EVD 2/25 >> 3/5  Antimicrobials:  Anti-infectives (From admission, onward)   Start     Dose/Rate Route Frequency Ordered Stop   05/21/20 1100  vancomycin (VANCOREADY) IVPB 1750 mg/350 mL  Status:  Discontinued        1,750 mg 175 mL/hr over 120 Minutes Intravenous Every 24 hours 05/20/20 0953 05/23/20 1046   05/20/20 1045  vancomycin (VANCOREADY) IVPB 2000 mg/400 mL        2,000 mg 200 mL/hr over 120 Minutes Intravenous  Once 05/20/20 0948 05/20/20 1341   05/19/20 1015  cefTRIAXone (ROCEPHIN) 2 g in sodium chloride 0.9 %  100 mL IVPB  Status:  Discontinued        2 g 200 mL/hr over 30 Minutes Intravenous Every 24 hours 05/18/20 0958 05/18/20 1132   05/18/20 1400  piperacillin-tazobactam (ZOSYN) IVPB 3.375 g  Status:  Discontinued        3.375 g 12.5 mL/hr over 240 Minutes Intravenous Every 8 hours 05/18/20 1132 05/23/20 1046   05/17/20 1015  cefTRIAXone (ROCEPHIN) 1 g in sodium chloride 0.9 % 100 mL IVPB  Status:  Discontinued        1 g 200 mL/hr over 30 Minutes Intravenous Every 24 hours 05/17/20 0918 05/18/20 0958   05/12/20 1530  ceFAZolin (ANCEF) IVPB 1 g/50 mL premix  Status:  Discontinued        1 g 100 mL/hr over 30 Minutes Intravenous Every 8 hours 05/12/20 1440 05/17/20 0918        Micro    Objective    Vitals:   05/30/20 1151 05/30/20 1436 05/30/20 1649 05/30/20 1756  BP: 128/78 120/73 (!) 143/80 136/83  Pulse: 66  65 79  Resp: 20  20   Temp: (!) 97.5 F (36.4 C)  98.4 F (36.9 C)   TempSrc: Axillary  Axillary   SpO2: 94%  98%   Weight:      Height:        Intake/Output Summary (Last 24 hours) at 05/30/2020 1900 Last data filed at 05/30/2020 1151 Gross per 24 hour  Intake 210 ml  Output 1400 ml  Net -1190 ml   Filed Weights   05/27/20 0414 05/28/20 0433 05/30/20 0433  Weight: 93.6 kg 98 kg 99.8 kg    Physical Exam:  General exam: appears sleeping, responds to voice and tactile stimulation, no acute distress HEENT: Cortrak in R nare, moist mucus membranes Respiratory system: overall clear with diminished bases, some referred upper airway secretion sounds, exam limited by patient's voice, no wheezes. Cardiovascular system: normal S1/S2, RRR   Gastrointestinal system: soft, NT, ND, +bowel sounds. Central nervous system: verbal today, oriented to self Extremities: offloading boots on feet b/l, R great toe amputated, onnychomychosis  Labs   Data Reviewed: I have personally reviewed following labs and imaging studies  CBC: Recent Labs  Lab 05/24/20 0327 05/24/20 1200 05/25/20 0313 05/26/20 0104 05/28/20 0404  WBC 12.2*  --  9.5 8.3 10.6*  HGB 14.5 12.9* 13.7 13.8 14.7  HCT 46.2 38.0* 42.6 44.8 47.7  MCV 90.4  --  89.7 90.5 90.7  PLT 211  --  242 256 952   Basic Metabolic Panel: Recent Labs  Lab 05/24/20 0327 05/24/20 1200 05/25/20 0313 05/26/20 0104 05/27/20 0407 05/28/20 0404  NA 148* 151* 151* 151* 153* 150*  K 3.6 3.6 3.8 4.3 3.8 4.0  CL 112*  --  116* 118* 118* 114*  CO2 25  --  $R'29 26 28 26  'nh$ GLUCOSE 140*  --  169* 214* 97 62*  BUN 27*  --  27* 27* 26* 27*  CREATININE 0.82  --  0.86 0.82 0.80 0.88  CALCIUM 8.3*  --  8.3* 8.2* 8.4* 8.3*  MG  --   --   --   --   --  2.6*  PHOS  --   --   --   --   --  4.9*   GFR: Estimated Creatinine Clearance:  91.2 mL/min (by C-G formula based on SCr of 0.88 mg/dL). Liver Function Tests: Recent Labs  Lab 05/24/20 0327 05/25/20 0313 05/28/20 0404  AST 72*  83* 42*  ALT 57* 90* 67*  ALKPHOS 63 61 68  BILITOT 0.5 0.5 0.5  PROT 6.5 6.2* 6.7  ALBUMIN 2.2* 2.0* 2.1*   No results for input(s): LIPASE, AMYLASE in the last 168 hours. No results for input(s): AMMONIA in the last 168 hours. Coagulation Profile: No results for input(s): INR, PROTIME in the last 168 hours. Cardiac Enzymes: No results for input(s): CKTOTAL, CKMB, CKMBINDEX, TROPONINI in the last 168 hours. BNP (last 3 results) No results for input(s): PROBNP in the last 8760 hours. HbA1C: No results for input(s): HGBA1C in the last 72 hours. CBG: Recent Labs  Lab 05/30/20 0011 05/30/20 0445 05/30/20 0856 05/30/20 1102 05/30/20 1648  GLUCAP 198* 124* 138* 127* 107*   Lipid Profile: No results for input(s): CHOL, HDL, LDLCALC, TRIG, CHOLHDL, LDLDIRECT in the last 72 hours. Thyroid Function Tests: No results for input(s): TSH, T4TOTAL, FREET4, T3FREE, THYROIDAB in the last 72 hours. Anemia Panel: No results for input(s): VITAMINB12, FOLATE, FERRITIN, TIBC, IRON, RETICCTPCT in the last 72 hours. Sepsis Labs: No results for input(s): PROCALCITON, LATICACIDVEN in the last 168 hours.  No results found for this or any previous visit (from the past 240 hour(s)).    Imaging Studies   No results found.   Medications   Scheduled Meds: . amantadine  100 mg Per Tube BID  . atorvastatin  40 mg Per NG tube q1800  . chlorhexidine gluconate (MEDLINE KIT)  15 mL Mouth Rinse BID  . diltiazem  90 mg Per Tube Q6H  . enoxaparin (LOVENOX) injection  40 mg Subcutaneous Daily  . feeding supplement (PROSource TF)  45 mL Per Tube TID  . free water  250 mL Per Tube Q3H  . hydrALAZINE  25 mg Per Tube Q8H  . insulin aspart  0-20 Units Subcutaneous Q4H  . insulin aspart  2 Units Subcutaneous Q4H  . insulin glargine  18 Units Subcutaneous  BID  . levETIRAcetam  750 mg Per Tube BID  . mouth rinse  15 mL Mouth Rinse q12n4p  . metoprolol tartrate  100 mg Per Tube TID  . pantoprazole sodium  40 mg Per Tube Daily  . thiamine  100 mg Per Tube Daily   Continuous Infusions: . sodium chloride    . feeding supplement (JEVITY 1.5 CAL/FIBER) 1,000 mL (05/30/20 0954)       LOS: 19 days    Time spent: 30 minutes     Ezekiel Slocumb, DO [facility]  05/30/2020, 7:00 PM      If 7PM-7AM, please contact night-coverage. How to contact the [facility] Attending or Consulting provider [place] or covering provider during after hours [place], for this patient?    1. Check the care team in [facility] and look for a) attending/consulting [facility] provider listed and b) the [facility] team listed 2. Log into [email or web address] and use [place]'s universal password to access. If you do not have the password, please contact the hospital operator. 3. Locate the [facility] provider you are looking for under [facility] and page to a number that you can be directly reached. 4. If you still have difficulty reaching the provider, please page the [facility] (Director on Call) for the Hospitalists listed on amion for assistance.

## 2020-05-30 NOTE — Progress Notes (Signed)
STROKE TEAM PROGRESS NOTE   SUBJECTIVE (INTERVAL HISTORY) Patient remains arousable today follows commands on the right and midline.  Mild increased work of breathing remains when he wakes up.   Hypernatremia persists with Na now down to 150  No family at the bedside.  Vital signs stable.  CBC    Component Value Date/Time   WBC 10.6 (H) 05/28/2020 0404   RBC 5.26 05/28/2020 0404   HGB 14.7 05/28/2020 0404   HCT 47.7 05/28/2020 0404   PLT 263 05/28/2020 0404   MCV 90.7 05/28/2020 0404   MCH 27.9 05/28/2020 0404   MCHC 30.8 05/28/2020 0404   RDW 14.8 05/28/2020 0404   LYMPHSABS 1.0 05/18/2020 0920   MONOABS 1.3 (H) 05/18/2020 0920   EOSABS 0.0 05/18/2020 0920   BASOSABS 0.1 05/18/2020 0920   BMP Latest Ref Rng & Units 05/28/2020 05/27/2020 05/26/2020  Glucose 70 - 99 mg/dL 62(L) 97 214(H)  BUN 8 - 23 mg/dL 27(H) 26(H) 27(H)  Creatinine 0.61 - 1.24 mg/dL 0.88 0.80 0.82  Sodium 135 - 145 mmol/L 150(H) 153(H) 151(H)  Potassium 3.5 - 5.1 mmol/L 4.0 3.8 4.3  Chloride 98 - 111 mmol/L 114(H) 118(H) 118(H)  CO2 22 - 32 mmol/L _0 Calcium 8.9 - 10.3 mg/dL 8.3(L) 8.4(L) 8.2(L)    IMAGING past 24 hours  EEG adult Result Date: 05/14/2020  IMPRESSION: This study is suggestive of moderate to evere diffuse encephalopathy, nonspecific etiology. No seizures or epileptiform discharges were seen throughout the recording.  PHYSICAL EXAM Temp:  [97.5 F (36.4 C)-98.5 F (36.9 C)] 97.5 F (36.4 C) (03/15 1151) Pulse Rate:  [65-91] 66 (03/15 1151) Resp:  [20-22] 20 (03/15 1151) BP: (112-137)/(68-79) 128/78 (03/15 1151) SpO2:  [94 %-100 %] 94 % (03/15 1151) Weight:  [99.8 kg] 99.8 kg (03/15 0433) Essentially  exam not significantly changed from yesterday-at different times as slightly different exam with differing levels of wakefulness. General: Comfortably laying in bed and sleeping. HEENT: Normocephalic atraumatic Cardiovascular: Irregularly irregular Respiratory: Scattered rales and  rhonchi with mildly increased effort of breathing Abdomen: Mildly tender,  extremities warm well perfused  . Neurological Exam Lethargic Arouses easily with stimulation Open eyes Was able to able to follow only occasional midline commands.   Was not able to verbalize his name today Inconsistent blink to threat from both sides Withdraws all 4 extremities slightly to noxious stimuli.  MED LIST  Current Facility-Administered Medications:  .  0.9 %  sodium chloride infusion, 250 mL, Intravenous, Continuous, Jason [name], Jason P, DO .  acetaminophen (TYLENOL) tablet 650 mg, 650 mg, Oral, Q4H PRN **OR** acetaminophen (TYLENOL) 160 MG/5ML solution 650 mg, 650 mg, Per Tube, Q4H PRN, 650 mg at 05/28/20 2007 **OR** acetaminophen (TYLENOL) suppository 650 mg, 650 mg, Rectal, Q4H PRN, Bhagat, Srishti L, MD, 650 mg at 05/17/20 1441 .  amantadine (SYMMETREL) 50 MG/5ML solution 100 mg, 100 mg, Per Tube, BID, Cyndia Skeeters, Taye T, MD, 100 mg at 05/30/20 0948 .  atorvastatin (LIPITOR) tablet 40 mg, 40 mg, Per NG tube, q1800, Olivencia-Simmons, Ivelisse, NP, 40 mg at 05/29/20 1707 .  chlorhexidine gluconate (MEDLINE KIT) (PERIDEX) 0.12 % solution 15 mL, 15 mL, Mouth Rinse, BID, Noemi Chapel P, DO, 15 mL at 05/30/20 0954 .  diltiazem (CARDIZEM) 10 mg/ml oral suspension 90 mg, 90 mg, Per Tube, Q6H, Blenda Nicely, RPH, 90 mg at 05/30/20 1154 .  enoxaparin (LOVENOX) injection 40 mg, 40 mg, Subcutaneous, Daily, Garvin Fila, MD, 40 mg at 05/30/20 0946 .  feeding supplement (JEVITY 1.5 CAL/FIBER) liquid 1,000 mL, 1,000 mL, Per Tube, Continuous, Garvin Fila, MD, Stopped at 05/30/20 0015 .  feeding supplement (PROSource TF) liquid 45 mL, 45 mL, Per Tube, TID, Spero Geralds, MD, 45 mL at 05/30/20 0948 .  free water 250 mL, 250 mL, Per Tube, Q3H, Gonfa, Taye T, MD, 250 mL at 05/30/20 1154 .  Gerhardt's butt cream, , Topical, PRN, Merlene Laughter F, NP, 1 application at 40/97/35 1437 .  hydrALAZINE (APRESOLINE) tablet 25  mg, 25 mg, Per Tube, Q8H, Gonfa, Taye T, MD, 25 mg at 05/30/20 0553 .  insulin aspart (novoLOG) injection 0-20 Units, 0-20 Units, Subcutaneous, Q4H, Magdalen Spatz, NP, 3 Units at 05/30/20 1153 .  insulin aspart (novoLOG) injection 2 Units, 2 Units, Subcutaneous, Q4H, Cyndia Skeeters, Taye T, MD, 2 Units at 05/30/20 1153 .  insulin glargine (LANTUS) injection 18 Units, 18 Units, Subcutaneous, BID, Mercy Riding, MD, 18 Units at 05/30/20 0945 .  labetalol (NORMODYNE) injection 10 mg, 10 mg, Intravenous, Q2H PRN, Garvin Fila, MD, 10 mg at 05/24/20 0932 .  levETIRAcetam (KEPPRA) 100 MG/ML solution 750 mg, 750 mg, Per Tube, BID, Amie Portland, MD, 750 mg at 05/30/20 0947 .  MEDLINE mouth rinse, 15 mL, Mouth Rinse, q12n4p, Amie Portland, MD, 15 mL at 05/30/20 1154 .  metoprolol tartrate (LOPRESSOR) tablet 100 mg, 100 mg, Per Tube, TID, Merlene Laughter F, NP, 100 mg at 05/30/20 0948 .  morphine 2 MG/ML injection 2 mg, 2 mg, Intravenous, Q4H PRN, Dellinger, Marianne L, PA-C .  ondansetron (ZOFRAN) injection 4 mg, 4 mg, Intravenous, Q6H PRN, Kipp Brood, MD, 4 mg at 05/19/20 1339 .  pantoprazole sodium (PROTONIX) 40 mg/20 mL oral suspension 40 mg, 40 mg, Per Tube, Daily, Julian Hy, DO, 40 mg at 05/30/20 0958 .  polyethylene glycol (MIRALAX / GLYCOLAX) packet 17 g, 17 g, Per Tube, Daily PRN, Gonfa, Taye T, MD .  senna-docusate (Senokot-S) tablet 1 tablet, 1 tablet, Per Tube, BID PRN, Cyndia Skeeters, Taye T, MD .  thiamine tablet 100 mg, 100 mg, Per Tube, Daily, Bailey-Modzik, Delila A, NP, 100 mg at 05/30/20 3299   ASSESSMENT/PLAN Jason [name] is a 70 y.o. male with history of HTN, DM, CAD/MI, afib on eliquis presenting with AMS, garbled speech, N/V and behavior change.   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 Including filling both the third ventricle with layering blood in the fourth and lateral ventricle  Repeat CT head 05/21/2020-done for concern for rightward gaze-with no new changes. CTA head and neck with no LVO. Thalamic ICH is resolving and blood seen layered in the posterior horns of lateral ventricles.  2D Echo EF 45-50%, No shunt  LDL 84  HgbA1c 9.4  VTE prophylaxis - SCDs, Lovenox 42m daily  Eliquis (apixaban) daily prior to admission, now on No  antithrombotic  Therapy recommendations:  SNF  Disposition:  SNF pending medical readiness  Son has been difficult to reach (?truckdriver) despite several attempts. He did come in this morning for discussion as above. Our discussion indicated he will need ongoing support for low health literacy, realistic goal  setting and education regarding his father's illness. He wishes to proceed with full treatment.  Discussed with Dr. Cyndia Skeeters who agrees to palliative care consult. I called the consult today. They may not be able to address until tomorrow.  Son was asked to provide additional contact information so he can be reached more readily.   Palliative care consult is appreciated but family wants full support   his facility staff reported his baseline functioning was  very slow to speak, able to independently transfer to  chair.   IVH  S/[name] EVD  2/27 Neurosurgery instilled tPA into EVD  3/1 EVD weaned to 20 cm H20, ICPs stable  3/2 failed clamping trial  3/5 EVD accidentally dislodged. Reintubated.   CT head repeat 3/6 stable, repeat at night due to concern for gaze deviation also stable.   3/7NSG signed off.   Extubated 3/8. EEG showed no seizure  Hypertensive emergency  Home meds:  Lotensin, metoprolol  Stable on the low end  On Benazepril 81m daily,  metoprolol 513mbid, cardizem 60 Q6 . Long-term BP goal normotensive  [facility]   On eliquis PTA  Reversed by Kcentra  No antithrombotics/anticoagulants due to ICCastanaContinue po cardizem and metoprolol for rate control  Cardiology consult appreciated: Rate control adequate.   Continue tele monitoring   EF 45%   Concern for seizure 3 nights ago. No recurrence   EEG with no evidence of seizures.  Severe diffuse encephalopathy of nonspecific etiology.  Continue Keppra 750 twice daily   HYPERNATREMIA -Likely multifactorial including medicines like Zosyn.  Zosyn discontinued 3/8. Free water was added.  Trending BMP. -Remains hyponatremic with sodium 148->151->151->151-->151 -Continue free water-appreciate CCM management.  ABDOMINAL TENDERNESS -Mild transaminitis.  Abdominal x-ray unremarkable. -If has more tenderness, will consider an ultrasound.  Appreciate CCM assistance.  Respiratory failure and LLL Pneumonia/sinusitis Fever - resolved  Extubated 3/1, vomited post extubation  Tachypnea re-intubated 3/5 and extubated 3/8  CCM on board and appreciated  Empiric antibiotics-completed 5 days of Zosyn.   WBC 8.2  (stable in last 4 days)  Tmax 100.2  Blood cultures 3/4 pending-no growth in 4 days  Appreciate CCM assistance with management  Dysphagia  Likely due to hemorrhagic stroke  Failed swallow eval  On TF @ 5552Speech on board  Waxing and waning mentation has been prohibitive for       safe swallowing  High aspiration risk   Attempting to reach family has been unsuccessful. Left  voicemail with son today.   SLP unable to assess today due to somnolence.  Hyperlipidemia  Home meds:  crestor 20  LDL 84, goal < 70  May continue statin at discharge  Diabetes type II Uncontrolled with hyperglycemia and episodes of hpoglycemia.  Home meds:  insulin  HgbA1c 9.4, goal < 7.0  SSI  Appreciate CCM assistance in  management  Other Stroke Risk Factors  Advanced Age >/= 6599 Hx stroke/TIA    Trauma team has been consulted and PEG tube is planned for later today. Will medical care.  Transfer to nursing home next few days after PEG tube.  PrAntony ContrasMD Medical Director [facility] Pager: [phone]/15/2022 1:39 PM

## 2020-05-31 ENCOUNTER — Inpatient Hospital Stay (HOSPITAL_COMMUNITY): Payer: Medicare HMO

## 2020-05-31 ENCOUNTER — Encounter (HOSPITAL_COMMUNITY)
Admission: EM | Disposition: A | Discharge status: Hospice | Payer: Self-pay | Source: Home / Self Care | Attending: Internal Medicine

## 2020-05-31 ENCOUNTER — Encounter (HOSPITAL_COMMUNITY): Payer: Self-pay | Admitting: Certified Registered Nurse Anesthetist

## 2020-05-31 DIAGNOSIS — T17908D Unspecified foreign body in respiratory tract, part unspecified causing other injury, subsequent encounter: Secondary | ICD-10-CM | POA: Diagnosis not present

## 2020-05-31 DIAGNOSIS — J9601 Acute respiratory failure with hypoxia: Secondary | ICD-10-CM | POA: Diagnosis not present

## 2020-05-31 DIAGNOSIS — I629 Nontraumatic intracranial hemorrhage, unspecified: Secondary | ICD-10-CM | POA: Diagnosis not present

## 2020-05-31 DIAGNOSIS — J9602 Acute respiratory failure with hypercapnia: Secondary | ICD-10-CM | POA: Diagnosis not present

## 2020-05-31 DIAGNOSIS — I615 Nontraumatic intracerebral hemorrhage, intraventricular: Secondary | ICD-10-CM | POA: Diagnosis not present

## 2020-05-31 LAB — COMPREHENSIVE METABOLIC PANEL
ALT: 46 U/L — ABNORMAL HIGH (ref 0–44)
AST: 35 U/L (ref 15–41)
Albumin: 2 g/dL — ABNORMAL LOW (ref 3.5–5.0)
Alkaline Phosphatase: 66 U/L (ref 38–126)
Anion gap: 9 (ref 5–15)
BUN: 42 mg/dL — ABNORMAL HIGH (ref 8–23)
CO2: 28 mmol/L (ref 22–32)
Calcium: 8.1 mg/dL — ABNORMAL LOW (ref 8.9–10.3)
Chloride: 112 mmol/L — ABNORMAL HIGH (ref 98–111)
Creatinine, Ser: 1.49 mg/dL — ABNORMAL HIGH (ref 0.61–1.24)
GFR, Estimated: 50 mL/min — ABNORMAL LOW (ref >60)
Glucose, Bld: 78 mg/dL (ref 70–99)
Potassium: 3.8 mmol/L (ref 3.5–5.1)
Sodium: 149 mmol/L — ABNORMAL HIGH (ref 135–145)
Total Bilirubin: 0.6 mg/dL (ref 0.3–1.2)
Total Protein: 6.4 g/dL — ABNORMAL LOW (ref 6.5–8.1)

## 2020-05-31 LAB — GLUCOSE, CAPILLARY
Glucose-Capillary: 60 mg/dL — ABNORMAL LOW (ref 70–99)
Glucose-Capillary: 95 mg/dL (ref 70–99)
Glucose-Capillary: 139 mg/dL — ABNORMAL HIGH (ref 70–99)
Glucose-Capillary: 37 mg/dL — CL (ref 70–99)
Glucose-Capillary: 63 mg/dL — ABNORMAL LOW (ref 70–99)
Glucose-Capillary: 86 mg/dL (ref 70–99)
Glucose-Capillary: 114 mg/dL — ABNORMAL HIGH (ref 70–99)
Glucose-Capillary: 110 mg/dL — ABNORMAL HIGH (ref 70–99)
Glucose-Capillary: 180 mg/dL — ABNORMAL HIGH (ref 70–99)

## 2020-05-31 LAB — BLOOD GAS, ARTERIAL
Acid-Base Excess: 2 mmol/L (ref 0.0–2.0)
Bicarbonate: 24.5 mmol/L (ref 20.0–28.0)
Drawn by: 519031
FIO2: 28
O2 Saturation: 93.4 %
Patient temperature: 39.2
pCO2 arterial: 31.9 mmHg — ABNORMAL LOW (ref 32.0–48.0)
pH, Arterial: 7.507 — ABNORMAL HIGH (ref 7.350–7.450)
pO2, Arterial: 73.1 mmHg — ABNORMAL LOW (ref 83.0–108.0)

## 2020-05-31 LAB — CBC
HCT: 44.8 % (ref 39.0–52.0)
Hemoglobin: 14 g/dL (ref 13.0–17.0)
MCH: 28.1 pg (ref 26.0–34.0)
MCHC: 31.3 g/dL (ref 30.0–36.0)
MCV: 90 fL (ref 80.0–100.0)
Platelets: 219 10*3/uL (ref 150–400)
RBC: 4.98 MIL/uL (ref 4.22–5.81)
RDW: 14.7 % (ref 11.5–15.5)
WBC: 9.9 10*3/uL (ref 4.0–10.5)
nRBC: 0 % (ref 0.0–0.2)

## 2020-05-31 LAB — LACTIC ACID, PLASMA
Lactic Acid, Venous: 2 mmol/L (ref 0.5–1.9)
Lactic Acid, Venous: 2.1 mmol/L (ref 0.5–1.9)

## 2020-05-31 LAB — PROCALCITONIN: Procalcitonin: 2.35 ng/mL

## 2020-05-31 LAB — MRSA PCR SCREENING: MRSA by PCR: NEGATIVE

## 2020-05-31 LAB — MAGNESIUM: Magnesium: 2.8 mg/dL — ABNORMAL HIGH (ref 1.7–2.4)

## 2020-05-31 SURGERY — CANCELLED PROCEDURE

## 2020-05-31 MED ORDER — DEXTROSE 50 % IV SOLN
25.0000 g | INTRAVENOUS | Status: AC
  Administered 2020-05-31: 25 g via INTRAVENOUS

## 2020-05-31 MED ORDER — VANCOMYCIN HCL 2000 MG/400ML IV SOLN
2000.0000 mg | Freq: Once | INTRAVENOUS | Status: AC
  Administered 2020-05-31: 2000 mg via INTRAVENOUS
  Filled 2020-05-31: qty 400

## 2020-05-31 MED ORDER — DILTIAZEM 12 MG/ML ORAL SUSPENSION
60.0000 mg | Freq: Four times a day (QID) | ORAL | Status: DC
  Administered 2020-05-31: 60 mg
  Filled 2020-05-31 (×2): qty 6

## 2020-05-31 MED ORDER — LACTATED RINGERS IV BOLUS
500.0000 mL | Freq: Once | INTRAVENOUS | Status: AC
  Administered 2020-05-31: 500 mL via INTRAVENOUS

## 2020-05-31 MED ORDER — DEXTROSE 50 % IV SOLN
INTRAVENOUS | Status: AC
  Filled 2020-05-31: qty 50

## 2020-05-31 MED ORDER — VANCOMYCIN HCL 1000 MG/200ML IV SOLN
1000.0000 mg | INTRAVENOUS | Status: DC
  Filled 2020-05-31: qty 200

## 2020-05-31 MED ORDER — METOPROLOL TARTRATE 25 MG PO TABS
25.0000 mg | ORAL_TABLET | Freq: Two times a day (BID) | ORAL | Status: DC
  Administered 2020-05-31: 25 mg
  Filled 2020-05-31: qty 1

## 2020-05-31 MED ORDER — DEXTROSE 50 % IV SOLN
12.5000 g | INTRAVENOUS | Status: AC
  Administered 2020-05-31: 12.5 g via INTRAVENOUS
  Filled 2020-05-31: qty 50

## 2020-05-31 MED ORDER — PIPERACILLIN-TAZOBACTAM 3.375 G IVPB
3.3750 g | Freq: Three times a day (TID) | INTRAVENOUS | Status: DC
  Administered 2020-05-31 – 2020-06-02 (×5): 3.375 g via INTRAVENOUS
  Filled 2020-05-31 (×7): qty 50

## 2020-05-31 MED ORDER — PIPERACILLIN-TAZOBACTAM 3.375 G IVPB 30 MIN
3.3750 g | Freq: Once | INTRAVENOUS | Status: AC
  Administered 2020-05-31: 3.375 g via INTRAVENOUS
  Filled 2020-05-31: qty 50

## 2020-05-31 MED ORDER — DEXTROSE-NACL 5-0.45 % IV SOLN: INTRAVENOUS | Status: DC

## 2020-05-31 MED ORDER — DEXTROSE 50 % IV SOLN
12.5000 g | INTRAVENOUS | Status: AC
  Administered 2020-05-31: 12.5 g via INTRAVENOUS

## 2020-05-31 NOTE — Progress Notes (Signed)
OT Cancellation Note  Patient Details Name: Jason [name] MRN: [id number] DOB: [birth year]   Cancelled Treatment:    Reason Eval/Treat Not Completed: Medical issues which prohibited therapy;Other (comment) Per chart review pt with a change in status pt now tachypneic with shallow breathing, and shivering. Will hold off on OT session for today, will check back as pt medically appropriate.   Jason Derrick., Jason [name] [facility] [phone]   Barron Schmid 05/31/2020, 10:03 AM

## 2020-05-31 NOTE — Progress Notes (Signed)
RT NOTES: ABG obtained and sent to lab. Lab tech notified.  

## 2020-05-31 NOTE — Progress Notes (Signed)
SLP Cancellation Note  Patient Details Name: Jason [name] MRN: [id number] DOB: 05-12-1950   Cancelled treatment:       Reason Eval/Treat Not Completed: Medical issues which prohibited therapy. Discussed pt with MD this morning. He has had a change in status compared to previous date, including new fever, increased lethargy, and audible secretions. MD recommends holding FEES until pt is more medically stable.     Mahala Menghini., M.A. CCC-SLP Acute Rehabilitation Services Pager [phone] Office [phone]  05/31/2020, 9:52 AM

## 2020-05-31 NOTE — Progress Notes (Signed)
Pt was tachypneic, shallow breathing, shivering. Stated that he was cold. Rectal temp of 100.6, Tylenol given. RR nurse consulted. Decreased stimulus in room. Pt was noted to be diaphoretic, CBG 60; see MAR. CBG retaken resulting at 95. Pt is resting comfortably, will continue to monitor.   Bari Edward, RN

## 2020-05-31 NOTE — Progress Notes (Signed)
STROKE TEAM PROGRESS NOTE   SUBJECTIVE (INTERVAL HISTORY) Patient became febrile yesterday with increased work of breathing and vacuum was postponed.  This morning he is improving slightly better.  WBC is normal.  Still has increased work of breathing.  Neurological exam is unchanged.  Vital signs are stable. CBC    Component Value Date/Time   WBC 9.9 05/31/2020 0331   RBC 4.98 05/31/2020 0331   HGB 14.0 05/31/2020 0331   HCT 44.8 05/31/2020 0331   PLT 219 05/31/2020 0331   MCV 90.0 05/31/2020 0331   MCH 28.1 05/31/2020 0331   MCHC 31.3 05/31/2020 0331   RDW 14.7 05/31/2020 0331   LYMPHSABS 1.0 05/18/2020 0920   MONOABS 1.3 (H) 05/18/2020 0920   EOSABS 0.0 05/18/2020 0920   BASOSABS 0.1 05/18/2020 0920   BMP Latest Ref Rng & Units 05/31/2020 05/28/2020 05/27/2020  Glucose 70 - 99 mg/dL 78 62(L) 97  BUN 8 - 23 mg/dL 42(H) 27(H) 26(H)  Creatinine 0.61 - 1.24 mg/dL 1.49(H) 0.88 0.80  Sodium 135 - 145 mmol/L 149(H) 150(H) 153(H)  Potassium 3.5 - 5.1 mmol/L 3.8 4.0 3.8  Chloride 98 - 111 mmol/L 112(H) 114(H) 118(H)  CO2 22 - 32 mmol/L $RemoveB'28 26 28  '[id number]$ Calcium 8.9 - 10.3 mg/dL 8.1(L) 8.3(L) 8.4(L)    IMAGING past 24 hours  EEG adult Result Date: 05/14/2020  IMPRESSION: This study is suggestive of moderate to evere diffuse encephalopathy, nonspecific etiology. No seizures or epileptiform discharges were seen throughout the recording.  PHYSICAL EXAM Temp:  [97.9 F (36.6 C)-102.6 F (39.2 C)] 101 F (38.3 C) (03/16 1342) Pulse Rate:  [64-82] 73 (03/16 1342) Resp:  [20-48] 47 (03/16 1342) BP: (88-143)/(45-83) 96/53 (03/16 1342) SpO2:  [94 %-100 %] 98 % (03/16 1342) Weight:  [99.2 kg] 99.2 kg (03/16 0417) Essentially  exam not significantly changed from yesterday-at different times as slightly different exam with differing levels of wakefulness. General: Comfortably laying in bed and sleeping. HEENT: Normocephalic atraumatic Cardiovascular: Irregularly irregular Respiratory: Scattered  rales and rhonchi with mildly increased effort of breathing Abdomen: Mildly tender,  extremities warm well perfused  . Neurological Exam Lethargic Arouses easily with stimulation Open eyes Was able to able to follow only occasional midline commands.   Was not able to verbalize his name today Inconsistent blink to threat from both sides Withdraws all 4 extremities slightly to noxious stimuli.  MED LIST  Current Facility-Administered Medications:  .  0.9 %  sodium chloride infusion, 250 mL, Intravenous, Continuous, Clark, Laura P, DO .  acetaminophen (TYLENOL) tablet 650 mg, 650 mg, Oral, Q4H PRN **OR** acetaminophen (TYLENOL) 160 MG/5ML solution 650 mg, 650 mg, Per Tube, Q4H PRN, 650 mg at 05/31/20 1351 **OR** acetaminophen (TYLENOL) suppository 650 mg, 650 mg, Rectal, Q4H PRN, Bhagat, Srishti L, MD, 650 mg at 05/31/20 0816 .  amantadine (SYMMETREL) 50 MG/5ML solution 100 mg, 100 mg, Per Tube, BID, Cyndia Skeeters, Taye T, MD, 100 mg at 05/31/20 1043 .  atorvastatin (LIPITOR) tablet 40 mg, 40 mg, Per NG tube, q1800, Olivencia-Simmons, Ivelisse, NP, 40 mg at 05/30/20 1755 .  chlorhexidine gluconate (MEDLINE KIT) (PERIDEX) 0.12 % solution 15 mL, 15 mL, Mouth Rinse, BID, Noemi Chapel P, DO, 15 mL at 05/31/20 0910 .  dextrose 5 %-0.45 % sodium chloride infusion, , Intravenous, Continuous, Domenic Polite, MD, Last Rate: 75 mL/hr at 05/31/20 0925, New Bag at 05/31/20 0925 .  enoxaparin (LOVENOX) injection 40 mg, 40 mg, Subcutaneous, Daily, Garvin Fila, MD, 40 mg at 05/31/20 1043 .  feeding supplement (JEVITY 1.5 CAL/FIBER) liquid 1,000 mL, 1,000 mL, Per Tube, Continuous, Garvin Fila, MD, Stopped at 05/31/20 0000 .  feeding supplement (PROSource TF) liquid 45 mL, 45 mL, Per Tube, TID, Spero Geralds, MD, 45 mL at 05/30/20 2033 .  free water 300 mL, 300 mL, Per Tube, Q3H, Nicole Kindred A, DO, 300 mL at 05/31/20 1201 .  Gerhardt's butt cream, , Topical, PRN, Merlene Laughter F, NP, 1 application at  83/38/25 1437 .  insulin aspart (novoLOG) injection 0-20 Units, 0-20 Units, Subcutaneous, Q4H, Magdalen Spatz, NP, 3 Units at 05/31/20 0009 .  levETIRAcetam (KEPPRA) 100 MG/ML solution 750 mg, 750 mg, Per Tube, BID, Amie Portland, MD, 750 mg at 05/31/20 1043 .  MEDLINE mouth rinse, 15 mL, Mouth Rinse, q12n4p, Amie Portland, MD, 15 mL at 05/31/20 1202 .  morphine 2 MG/ML injection 2 mg, 2 mg, Intravenous, Q4H PRN, Dellinger, Marianne L, PA-C .  ondansetron (ZOFRAN) injection 4 mg, 4 mg, Intravenous, Q6H PRN, Kipp Brood, MD, 4 mg at 05/19/20 1339 .  pantoprazole sodium (PROTONIX) 40 mg/20 mL oral suspension 40 mg, 40 mg, Per Tube, Daily, Julian Hy, DO, 40 mg at 05/30/20 0958 .  piperacillin-tazobactam (ZOSYN) IVPB 3.375 g, 3.375 g, Intravenous, Q8H, Kris Mouton, RPH .  polyethylene glycol (MIRALAX / GLYCOLAX) packet 17 g, 17 g, Per Tube, Daily PRN, Gonfa, Taye T, MD .  senna-docusate (Senokot-S) tablet 1 tablet, 1 tablet, Per Tube, BID PRN, Cyndia Skeeters, Taye T, MD .  thiamine tablet 100 mg, 100 mg, Per Tube, Daily, Bailey-Modzik, Delila A, NP, 100 mg at 05/31/20 1043 .  [START ON 06/01/2020] vancomycin (VANCOREADY) IVPB 1000 mg/200 mL, 1,000 mg, Intravenous, Q24H, Kris Mouton, RPH .  vancomycin (VANCOREADY) IVPB 2000 mg/400 mL, 2,000 mg, Intravenous, Once, Kris Mouton, [facility]   ASSESSMENT/PLAN Mr. Ayvin Lipinski is a 70 y.o. male with history of HTN, DM, CAD/MI, afib on eliquis presenting with AMS, garbled speech, N/V and behavior change.   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 Including filling both the third ventricle with  layering blood in the fourth and lateral ventricle  Repeat CT head 05/21/2020-done for concern for rightward gaze-with no new changes. CTA head and neck with no LVO. Thalamic ICH is resolving and blood seen layered in the posterior horns of lateral ventricles.  2D Echo EF 45-50%, No shunt  LDL 84  HgbA1c 9.4  VTE prophylaxis - SCDs, Lovenox $RemoveBefor'40mg'[id number]$  daily  Eliquis (apixaban) daily prior to admission, now on No  antithrombotic  Therapy recommendations:  SNF  Disposition:  SNF pending medical readiness  Son has been difficult to reach (?truckdriver) despite several attempts. He did come in this morning for discussion as above. Our discussion indicated he will need ongoing support for low health literacy, realistic goal setting and education regarding his father's illness. He wishes to proceed with full treatment.  Discussed with Dr. Cyndia Skeeters who agrees to palliative care consult. I called the consult today. They may not be able to address until tomorrow.  Son was asked to provide additional contact information so he can be reached more readily.  Palliative care consult is appreciated but family wants full support   his facility staff reported his baseline functioning was  very slow to speak, able to independently transfer to  chair.   IVH  S/p EVD  2/27 Neurosurgery instilled tPA into EVD  3/1 EVD weaned to 20 cm H20, ICPs stable  3/2 failed clamping trial  3/5 EVD accidentally dislodged. Reintubated.   CT head repeat 3/6 stable, repeat at night due to concern for gaze deviation also stable.   3/7NSG signed off.   Extubated 3/8. EEG showed no seizure  Hypertensive emergency  Home meds:  Lotensin, metoprolol  Stable on the low end  On Benazepril $RemoveBefor'40mg'[id number]$  daily, metoprolol $RemoveBeforeDEI'50mg'WejtlbpVGDvhOrDu$  bid, cardizem 60 Q6 . Long-term BP goal normotensive  Afib/Aflutter on [facility] with HF   On eliquis PTA  Reversed by Kcentra  No antithrombotics/anticoagulants due to [place]  Continue po cardizem and  metoprolol for rate control  Cardiology consult appreciated: Rate control adequate.   Continue tele monitoring   EF 45%   Concern for seizure 3 nights ago. No recurrence   EEG with no evidence of seizures.  Severe diffuse encephalopathy of nonspecific etiology.  Continue Keppra 750 twice daily   HYPERNATREMIA -Likely multifactorial including medicines like Zosyn.  Zosyn discontinued 3/8. Free water was added.  Trending BMP. -Remains hyponatremic with sodium 148->151->151->151-->151 -Continue free water-appreciate CCM management.  ABDOMINAL TENDERNESS -Mild transaminitis.  Abdominal x-ray unremarkable. -If has more tenderness, will consider an ultrasound.  Appreciate CCM assistance.  Respiratory failure and LLL Pneumonia/sinusitis Fever - resolved  Extubated 3/1, vomited post extubation  Tachypnea re-intubated 3/5 and extubated 3/8  CCM on board and appreciated  Empiric antibiotics-completed 5 days of Zosyn.   WBC 8.2  (stable in last 4 days)  Tmax 100.2  Blood cultures 3/4 pending-no growth in 4 days  Appreciate CCM assistance with management  Dysphagia  Likely due to hemorrhagic stroke  Failed swallow eval  On TF @ 39  Speech on board  Waxing and waning mentation has been prohibitive for       safe swallowing  High aspiration risk   Attempting to reach family has been unsuccessful. Left  voicemail with son today.   SLP unable to assess today due to somnolence.  Hyperlipidemia  Home meds:  crestor 20  LDL 84, goal < 70  May continue statin at discharge  Diabetes type II Uncontrolled with hyperglycemia and episodes of hpoglycemia.  Home meds:  insulin  HgbA1c 9.4, goal < 7.0  SSI  Appreciate CCM assistance in management  Other Stroke Risk Factors  Advanced Age >/= 54   Hx stroke/TIA    Trauma team has been consulted and PEG tube i which has now been postponed till Friday.  Discussed with Dr. Darryll Capers.  Continue ongoing medical  care.  Transfer to nursing home  after PEG tube when medically stable. Antony Contras, MD Medical Director [facility] Pager: [phone] 05/31/2020 2:15 PM

## 2020-05-31 NOTE — Progress Notes (Addendum)
PROGRESS NOTE    Jason [name]   TDV:[id number]  DOB: May 04, 1950  PCP: Jodi Marble, MD    DOA: 05/11/2020 LOS: 56   Brief Narrative   70 year old M with PMH of CAD, DM-2, PAF on Eliquis, CVA, HTN and systolic CHF admitted to ICU on 05/11/2020 with >1.2cm thalamic hemorrhage with intraventricular extension in the setting of hypertensive emergency.  He received Kcentra for Eliquis reversal.  He was intubated and had external ventricular drain placed on 05/12/2020.  He was extubated on 05/16/2020.  EVD dislodged on 05/20/2020.  He was reintubated on 3/5, and eventually extubated to BiPAP on 3/8.  He was transferred to [facility] service on 3/12 on room air.  Clinical course since then, patient continues to be persistently encephalopathic, in semi-vegetative state.  Palliative care consulted. Family desire to give patient more time to see if he will improve, thus patient remains full code and full scope of care including plan for PEG tube placement 3/14-15, mental status was improving, plan was made for fees 3/16-severe sepsis, fever of 102.8, worsening mental status and blood pressure trending down  Assessment & Plan    Left thalamic hemorrhage with intraventricular extension/intraparenchymal hemorrhage Encephalopathy -In the setting of hypertensive emergency and on anticoagulation for A. Fib -Received KCentra for Eliquis reversal in ED -Followed by neurosurgery and neurology, external ventricular drain from 2/25-3/5.  -EEG with generalized slowing consistent with moderate to severe diffuse encephalopathy -Continued on Keppra for seizure prophylaxis -on tube feeds via cortrak  -PEG placement is pending, surgery consulted 3/14 -Neurology following  -Palliative care consulted, ongoing Zap discussions with family, son wants full scope of care at this time  Severe sepsis 3/16 -Fever of 102.8, suspect aspiration pneumonia, has increased scattered rhonchi on exam, chest x-ray relatively unchanged,  will repeat in 1 to 2 days -Check blood cultures -Repeat CT head stable -Empiric vancomycin and Zosyn today, if cultures are negative will discontinue vancomycin in 24 to 48 hours -Fluid bolus x2 today and D5 half-normal at 75 an hour -Will request CCM eval, in setting of worsening mentation and sepsis, in the background of encephalopathy and intracranial hemorrhage  Acute respiratory failure with hypoxia  -Secondary to hydrocephalus and intracranial hemorrhage, - intubation and mechanical ventilation from 2/25-3/1, reintubated 3/5-3/8 -With ongoing tachypnea, concern for difficulty managing secretions, remains at high risk for intubation -Continue n.p.o., aspiration precautions  Persistent A. fib/A flutter: - Rate has been controlled mostly in 60's. Was on Eliquis POA. -Continue holding Eliquis -Holding Cardizem and Lopressor today with low, soft blood pressures  Chronic systolic CHF:  - LVEF 45 to 50%.  - no overt fluid overload. -Monitor off diuretics at this time  Hypernatremia:  -Continue increased free water and add D5 half NS  Hypertensive emergency/uncontrolled hypertension:  BP 205/120 on admit.   Now hypotensive -Holding Cardizem, metoprolol and hydralazine  History of CAD: Stable -Cardiac meds as above.  Uncontrolled IDDM-2: A1c 9.4% -Hypoglycemic today in the setting of sepsis, hold Lantus and premeal NovoLog  Physical deconditioning -Continue PT/OT/SLP as much as possible  Goal of care: Significant comorbidity as above, and  poor prognosis.   -Several of my partners have recommended against aggressive interventions including resuscitation with patient's son, seen by palliative care, status post family meetings -Son continues to request full scope of care at this time  Pressure skin injury: Stage II Pressure Injury 05/24/20 Buttocks Bilateral;Medial Deep Tissue Pressure Injury - Purple or maroon localized area of discolored intact skin or blood-filled  blister  due to damage of underlying soft tissue from pressure and/or shear. Multiple blisters along (Active)  05/24/20 0800  Location: Buttocks  Location Orientation: Bilateral;Medial  Staging: Deep Tissue Pressure Injury - Purple or maroon localized area of discolored intact skin or blood-filled blister due to damage of underlying soft tissue from pressure and/or shear.  Wound Description (Comments): Multiple blisters along with deep tissue injury.  Present on Admission:      Obesity: Body mass index is 31.38 kg/m.  Complicates overall care and prognosis.  DVT prophylaxis: enoxaparin (LOVENOX) injection 40 mg Start: 05/15/20 1130 SCD's Start: 05/11/20 2335   Diet:  Diet Orders (From admission, onward)    Start     Ordered   05/23/20 1053  Diet NPO time specified  Diet effective now        05/23/20 1052            Code Status: Full Code    Subjective 05/31/20    -Unresponsive today, temp of 102.8, tachypneic   Disposition Plan & Communication   Status is: Inpatient  Inpatient status is appropriate due to severity of illness, requiring very close monitoring. Nutrition being addressed, ongoing electrolyte abnormalities.  Dispo: The patient is from: Home              Anticipated d/c is to: SNF              Patient currently is not medically stable for d/c   Difficult to place patient - No   Consults, Procedures, Significant Events   Consultants:  Neurosurgery Intensivist Cardiology Neurology Palliative medicine  Procedures:  ETT 2/25 >> 3/1, 3/5 >> 3/8 EVD 2/25 >> 3/5  Antimicrobials:  Anti-infectives (From admission, onward)   Start     Dose/Rate Route Frequency Ordered Stop   06/01/20 1300  vancomycin (VANCOREADY) IVPB 1000 mg/200 mL        1,000 mg 200 mL/hr over 60 Minutes Intravenous Every 24 hours 05/31/20 1323     05/31/20 1900  piperacillin-tazobactam (ZOSYN) IVPB 3.375 g        3.375 g 12.5 mL/hr over 240 Minutes Intravenous Every 8 hours  05/31/20 1012     05/31/20 1100  vancomycin (VANCOREADY) IVPB 2000 mg/400 mL        2,000 mg 200 mL/hr over 120 Minutes Intravenous  Once 05/31/20 1012     05/31/20 1100  piperacillin-tazobactam (ZOSYN) IVPB 3.375 g        3.375 g 100 mL/hr over 30 Minutes Intravenous  Once 05/31/20 1012     05/21/20 1100  vancomycin (VANCOREADY) IVPB 1750 mg/350 mL  Status:  Discontinued        1,750 mg 175 mL/hr over 120 Minutes Intravenous Every 24 hours 05/20/20 0953 05/23/20 1046   05/20/20 1045  vancomycin (VANCOREADY) IVPB 2000 mg/400 mL        2,000 mg 200 mL/hr over 120 Minutes Intravenous  Once 05/20/20 0948 05/20/20 1341   05/19/20 1015  cefTRIAXone (ROCEPHIN) 2 g in sodium chloride 0.9 % 100 mL IVPB  Status:  Discontinued        2 g 200 mL/hr over 30 Minutes Intravenous Every 24 hours 05/18/20 0958 05/18/20 1132   05/18/20 1400  piperacillin-tazobactam (ZOSYN) IVPB 3.375 g  Status:  Discontinued        3.375 g 12.5 mL/hr over 240 Minutes Intravenous Every 8 hours 05/18/20 1132 05/23/20 1046   05/17/20 1015  cefTRIAXone (ROCEPHIN) 1 g in sodium chloride 0.9 % 100 mL IVPB  Status:  Discontinued        1 g 200 mL/hr over 30 Minutes Intravenous Every 24 hours 05/17/20 0918 05/18/20 0958   05/12/20 1530  ceFAZolin (ANCEF) IVPB 1 g/50 mL premix  Status:  Discontinued        1 g 100 mL/hr over 30 Minutes Intravenous Every 8 hours 05/12/20 1440 05/17/20 0918        Micro    Objective   Vitals:   05/31/20 0939 05/31/20 0953 05/31/20 1011 05/31/20 1043  BP: (!) 93/52 (!) 103/57 101/67   Pulse: 79 71 78 82  Resp:   (!) 40   Temp:      TempSrc:      SpO2: 100% 98% 97%   Weight:      Height:        Intake/Output Summary (Last 24 hours) at 05/31/2020 1337 Last data filed at 05/31/2020 0500 Gross per 24 hour  Intake -  Output 1050 ml  Net -1050 ml   Filed Weights   05/28/20 0433 05/30/20 0433 05/31/20 0417  Weight: 98 kg 99.8 kg 99.2 kg    Physical Exam: Chronically ill elderly  male laying in bed, unresponsive, withdraws to deep painful stimuli HEENT: Pupils are equal and reactive CVS: S1-S2, regular rhythm, tachycardic Lungs: Few scattered rhonchi Abdomen: Soft, nontender, bowel sounds present Extremities: No edema, offloading Neuro: Unresponsive, does not follow commands, pupils equal and reactive   Labs   Data Reviewed: I have personally reviewed following labs and imaging studies  CBC: Recent Labs  Lab 05/25/20 0313 05/26/20 0104 05/28/20 0404 05/31/20 0331  WBC 9.5 8.3 10.6* 9.9  HGB 13.7 13.8 14.7 14.0  HCT 42.6 44.8 47.7 44.8  MCV 89.7 90.5 90.7 90.0  PLT 242 256 263 924   Basic Metabolic Panel: Recent Labs  Lab 05/25/20 0313 05/26/20 0104 05/27/20 0407 05/28/20 0404 05/31/20 0331  NA 151* 151* 153* 150* 149*  K 3.8 4.3 3.8 4.0 3.8  CL 116* 118* 118* 114* 112*  CO2 $Re'29 26 28 26 28  'ZDo$ GLUCOSE 169* 214* 97 62* 78  BUN 27* 27* 26* 27* 42*  CREATININE 0.86 0.82 0.80 0.88 1.49*  CALCIUM 8.3* 8.2* 8.4* 8.3* 8.1*  MG  --   --   --  2.6* 2.8*  PHOS  --   --   --  4.9*  --    GFR: Estimated Creatinine Clearance: 53.7 mL/min (A) (by C-G formula based on SCr of 1.49 mg/dL (H)). Liver Function Tests: Recent Labs  Lab 05/25/20 0313 05/28/20 0404 05/31/20 0331  AST 83* 42* 35  ALT 90* 67* 46*  ALKPHOS 61 68 66  BILITOT 0.5 0.5 0.6  PROT 6.2* 6.7 6.4*  ALBUMIN 2.0* 2.1* 2.0*   No results for input(s): LIPASE, AMYLASE in the last 168 hours. No results for input(s): AMMONIA in the last 168 hours. Coagulation Profile: No results for input(s): INR, PROTIME in the last 168 hours. Cardiac Enzymes: No results for input(s): CKTOTAL, CKMB, CKMBINDEX, TROPONINI in the last 168 hours. BNP (last 3 results) No results for input(s): PROBNP in the last 8760 hours. HbA1C: No results for input(s): HGBA1C in the last 72 hours. CBG: Recent Labs  Lab 05/31/20 0533 05/31/20 0752 05/31/20 0815 05/31/20 1145 05/31/20 1223  GLUCAP 95 37* 139* 63*  86   Lipid Profile: No results for input(s): CHOL, HDL, LDLCALC, TRIG, CHOLHDL, LDLDIRECT in the last 72 hours. Thyroid Function Tests: No results for input(s): TSH, T4TOTAL, FREET4, T3FREE, THYROIDAB  in the last 72 hours. Anemia Panel: No results for input(s): VITAMINB12, FOLATE, FERRITIN, TIBC, IRON, RETICCTPCT in the last 72 hours. Sepsis Labs: Recent Labs  Lab 05/31/20 0933  LATICACIDVEN 2.0*    No results found for this or any previous visit (from the past 240 hour(s)).    Imaging Studies   CT HEAD WO CONTRAST  Result Date: 05/31/2020 CLINICAL DATA:  Mental status change.  Sepsis. EXAM: CT HEAD WITHOUT CONTRAST TECHNIQUE: Contiguous axial images were obtained from the base of the skull through the vertex without intravenous contrast. COMPARISON:  March 6 22. FINDINGS: Brain: Decreased intraventricular hemorrhage layering within the occipital horns bilaterally. No evidence of new hemorrhage. No evidence of acute large vascular territory infarct. Similar patchy white matter hypodensities, most likely related to chronic microvascular ischemic disease. Similar sequela of prior right frontal ventriculostomy with resolution of the previously seen hemorrhage along the tract. Vascular: Calcific atherosclerosis. No hyperdense vessel identified. Skull: Right frontal burr hole.  No acute fracture. Sinuses/Orbits: Opacification of a left ethmoid air cell. Mucosal thickening of the right sphenoid sinus with air-fluid level and frothy secretions. Unremarkable orbits. Other: Trace bilateral mastoid effusions. IMPRESSION: 1. Decreased intraventricular hemorrhage layering within the occipital horns bilaterally. Similar ventriculomegaly. 2. Paranasal sinus mucosal thickening with frothy secretions and air-fluid level in the right sphenoid sinus. Electronically Signed   By: Margaretha Sheffield MD   On: 05/31/2020 11:10   DG CHEST PORT 1 VIEW  Result Date: 05/31/2020 CLINICAL DATA:  Fever, altered mental  status EXAM: PORTABLE CHEST 1 VIEW COMPARISON:  05/27/2020 FINDINGS: Feeding tube traverses esophagus into stomach. 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 Atherosclerotic calcification aorta. Mediastinal contours and pulmonary vascularity normal. Persistent infiltrate versus atelectasis at LEFT base. Remaining lungs clear. No pleural effusion or pneumothorax. Skin fold projects over LEFT upper chest. Old BILATERAL rib fractures. IMPRESSION: Persistent LEFT basilar opacity which could represent atelectasis or infiltrate. Electronically Signed   By: Lavonia Dana M.D.   On: 05/31/2020 08:44     Medications   Scheduled Meds: . amantadine  100 mg Per Tube BID  . atorvastatin  40 mg Per NG tube q1800  . chlorhexidine gluconate (MEDLINE KIT)  15 mL Mouth Rinse BID  . enoxaparin (LOVENOX) injection  40 mg Subcutaneous Daily  . feeding supplement (PROSource TF)  45 mL Per Tube TID  . free water  300 mL Per Tube Q3H  . insulin aspart  0-20 Units Subcutaneous Q4H  . insulin aspart  2 Units Subcutaneous Q4H  . insulin glargine  18 Units Subcutaneous BID  . levETIRAcetam  750 mg Per Tube BID  . mouth rinse  15 mL Mouth Rinse q12n4p  . pantoprazole sodium  40 mg Per Tube Daily  . thiamine  100 mg Per Tube Daily   Continuous Infusions: . sodium chloride    . dextrose 5 % and 0.45% NaCl 75 mL/hr at 05/31/20 0925  . feeding supplement (JEVITY 1.5 CAL/FIBER) Stopped (05/31/20 0000)  . piperacillin-tazobactam    . piperacillin-tazobactam (ZOSYN)  IV    . [START ON 06/01/2020] vancomycin    . vancomycin         LOS: 20 days    Time spent: 50 minutes     Domenic Polite, MD [facility]  05/31/2020, 1:37 PM

## 2020-05-31 NOTE — Progress Notes (Signed)
Pt had CBG 37. RN gave IV dextrose 25g and repeat CBG 137. Pt had change in mental status, eyes open to voice but not following commands. Pt was tachypneic, hypotensive, and hot to touch. Rectal temp 102.6, RN gave Tylenol suppository. MD Jomarie Longs notified. Rapid response consulted. Cold wash clothes and ice packs were applied. CXR done, CT head done, blood gas collected, fluids running, IV antibiotics added. Will continue to monitor.   Lindi Adie, RN

## 2020-05-31 NOTE — Progress Notes (Signed)
NAME:  Jason [name], MRN:  [id number], DOB:  1951/01/27, LOS: 20 ADMISSION DATE:  05/11/2020, CONSULTATION DATE:  2/25 REFERRING MD:  Dr. Iver Nestle (Neurology) CHIEF COMPLAINT:  AMS  Brief History:  70 year old male admitted with ICH (s/p KCentra in the setting of Eliquis) and concern for hydrocephalus; ultimately requiring intubation for airway protection. EVD placed by Neurosurgery 2/25 (out 3/5).  Past Medical History:  CAD, T2DM, HTN, PAF (on Eliquis), CVA  Significant Hospital Events:  2/24 Admitted for ICH, KCentra given in the setting of Eliquis 2/25 Intubated, EVD placed 3/01 Extubated 3/02 EVD clamped, however worsening mental status so reopened 3/05 Reintubated, EVD removed 3/07 Attempted vent wean, limited by tachypnea 3/08 Extubated, tachypneic 3/09 BiPAP trial 3/10 Did not require BIPAP overnight  3/16 PCCM called back for AMS, fever, tachypnea.  Possible aspiration.   Consults:  Neurosurgery, PCCM  Procedures:  ETT 2/25 >> 3/1, 3/5 >> 3/8 EVD 2/25 >> 3/5  Significant Diagnostic Tests:   CT head 2/24 > 1.2cm thalamic hemorrhage with intraventricular extension. Chronic right corona radiata lacunar insult.   CT head 2/25 > Medial left thalamic intraparenchymal hemorrhage appears the same, approximately 1 cm in size. Extension into the ventricular system with the amount of intraventricular blood being very similar. Ventriculomegaly appears the same without further dilatation.  Echo 2/25 >> estimation, is 45 to 50%.The left ventricle has no  regional wall motion abnormalities.  CT Head w/o 3/16 >> decreased intraventricular hemorrhage, similar ventriculomegaly, air fluid level in the sphenoid sinus  Micro Data:  Blood 2/24 >> negative Urine 2/24 >> negative Resp Cx 3/2 >> normal flora Blood Cx 3/4 >> negative Sputum 3/4 >> normal flora BCx2 3/16 >>   Antimicrobials:  Cefazolin 2/24 >> 3/1 Rocephin 3/1 >> 3/2 Zosyn 3/3 >> 3/8 Vanc 3/5 >> 3/8  Interim History  / Subjective:  Tmax 102.6 / WBC 9.9 RN reports tachypnea, decreased mental status  Objective   Blood pressure (!) 96/53, pulse 73, temperature (!) 101 F (38.3 C), temperature source Rectal, resp. rate (!) 47, height 5\' 10"  (1.778 m), weight 99.2 kg, SpO2 98 %.        Intake/Output Summary (Last 24 hours) at 05/31/2020 1415 Last data filed at 05/31/2020 0500 Gross per 24 hour  Intake --  Output 1050 ml  Net -1050 ml   Filed Weights   05/28/20 0433 05/30/20 0433 05/31/20 0417  Weight: 98 kg 99.8 kg 99.2 kg   Physical Exam: General: ill appearing adult male lying in bed in NAD HEENT: MM pink/dry, pupils equal / reactive, anicteric  Neuro: awakens to voice, tracks provider, moves left side spontaneously, withdraws to pain on RLE, localizes with bilateral upper extremities to painful stimuli  CV: s1s2 irr irr, AF on monitor, no m/r/g PULM: tachypnea but non-labored, no paradoxical movement, lungs clear bilaterally  GI: soft, bsx4 active, cortrak in place  Extremities: warm/dry, trace dependent edema  Skin: no rashes or lesions  Resolved Hospital Problem list   Hypokalemia  Acute respiratory failure with hypoxia requiring MV - Extubated 3/8 for the second time  Likely aspiration pneumonia on admission-S/P couple rounds of antibiotics as above Sphenoid sinusitis-Noted incidentally on CT scan. Vanc/Zosyn discontinued 3/8 in the setting of hypernatremia. QTc prolongation Hypertensive emergency POA  Assessment & Plan:   Fever, Tachypnea.  Rule Out Sepsis  S/p 5/8 bolus, possible aspiration event.  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 Likely hypertensive in nature. Anticoagulated on Eliquis, KCentra given in ED. S/p EVD placement 2/25 (removed 3/5). EEG 3/6 demonstrating generalized slowing c/f moderate to severe diffuse encephalopathy. S/p Keppra  load -per primary / Neurology  -avoid fever, hyper/hyponatremia, hyper/hypoglycemia  -AED's per Neurology  -aspiration precautions  AKI  Hypernatremia  Noted rise in Sr Cr and Na, ? Hypovolemic  -bolus as above -follow I/O's -free water PT -Trend BMP / urinary output -Replace electrolytes as indicated -Avoid nephrotoxic agents, ensure adequate renal perfusion  Physical deconditioning At risk for malnutrition -per primary  -hold TF for now, consider restarting this evening at 63ml and increasing back to goal per protocol  -D5NS while TF off   HX of HTN  SBP on arrival to [facility] ED 205/120 -per primary   Abdominal Pain Noted on exam 3/8, patient endorses pain/winces when BLQ are palpated. 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 3/6-3/8, patient was noted to have hypoglycemic episodes with lows in the 50s-70s. -per primary   Stage 2 pressure ulcer  Patient was seen with stage 2 medial buttock pressure ulcer 3/9 -per primary   Best practice (evaluated daily)  Diet: NPO, TFs Pain/Anxiety/Delirium protocol (if indicated): N/A VAP protocol (if indicated): N/A DVT prophylaxis: Lovenox GI prophylaxis: PPI Glucose control: SSI Mobility: Bedrest while intubated Disposition: ICU  Goals of Care:  Per primary    Critical care time:  n/a    Canary Brim, MSN, APRN, NP-C, AGACNP-BC [facility] 05/31/2020, 2:22 PM   Please see [email or web address] for pager details.   From 7A-7P if no response, please call [phone] After hours, please call ELink [phone]

## 2020-05-31 NOTE — Significant Event (Signed)
Rapid Response Event Note   Reason for Call :  Temp 102,  RR 40s, decreased LOC  Initial Focused Assessment:  Patient with decreased LOC compared to prior documented assessments.  He is flushed Increased RR mild increased WOB AFl 70s BP  88/45-106/52 RR 40s  Dr Jomarie Longs at bedside  Interventions:  500 cc LR bolus D5 1/2 NS @ 75cc/hr ABG done PCXR done CT head done Blood cultures & LA drawn  Plan of Care:  RED MEWS VS protocol  Event Summary:   MD Notified: Dr Jomarie Longs Call Time: [phone] Arrival Time: 1031 End Time: 1045  Marcellina Millin, RN

## 2020-05-31 NOTE — Progress Notes (Signed)
Inpatient Diabetes Program Recommendations  AACE/ADA: New Consensus Statement on Inpatient Glycemic Control (2015)  Target Ranges:  Prepandial:   less than 140 mg/dL      Peak postprandial:   less than 180 mg/dL (1-2 hours)      Critically ill patients:  140 - 180 mg/dL   Lab Results  Component Value Date   GLUCAP 86 05/31/2020   HGBA1C 9.4 (H) 05/11/2020    Review of Glycemic Control Results for KOL, CONSUEGRA (MRN [id number]) as of 05/31/2020 13:50  Ref. Range 05/31/2020 05:33 05/31/2020 07:52 05/31/2020 08:15 05/31/2020 11:45 05/31/2020 12:23  Glucose-Capillary Latest Ref Range: 70 - 99 mg/dL 95 37 (LL) 536 (H) 63 (L) 86   Diabetes history: DM 2 Outpatient Diabetes medications:  Basaglar 20 units daily, Humalog 2-12 units tid with meals Current orders for Inpatient glycemic control:  Novolog resistant q 4 hours Lantus 18 units bid  Inpatient Diabetes Program Recommendations:    Note that tube feeds currently held.  Consider reducing Novolog correction to sensitive q 4 hours.  Also consider holding Lantus for now.    Thanks,  Beryl Meager, RN, BC-ADM Inpatient Diabetes Coordinator Pager [phone] (8a-5p)

## 2020-05-31 NOTE — Progress Notes (Signed)
Nutrition Follow-up  DOCUMENTATION CODES:   Not applicable  INTERVENTION:   Once able to resume tube feeds via Cortrak, Recommend resuming Jevity 1.5 cal formula at 20 ml/hr and increase by 10 ml every 4 hours to goal rate of 55 ml/hr.   Provide 45 ml Prosource TF TID per tube.   Per MD orders, free water flushes of 300 ml q 3 hours per tube.   Tube feeding at goal to provide 2100 kcal, 117 grams of protein, 3403 ml free water.    NUTRITION DIAGNOSIS:   Inadequate oral intake related to inability to eat as evidenced by NPO status; ongoing  GOAL:   Patient will meet greater than or equal to 90% of their needs; to be met with TF  MONITOR:   TF tolerance  REASON FOR ASSESSMENT:   Consult,Ventilator Enteral/tube feeding initiation and management  ASSESSMENT:   Pt with PMH of anxiety, depression, DM, HTN, stroke, and MI admitted with [place] likely due to HTN.  2/25 s/p EVD and intubation 3/01 extubated; vomited post extubation concern for aspiration PNA 3/02 failed CVD clamping; cortrak placed with tip gastric per xray 3/03 Cortrak placed 3/05 EVD removed, Re-intubated 3/8 extubated   Pt with fever, sepsis, worsening mental status, and hypotension. Tube feeds were stopped. Per MD, possible plans to restart tube feeds in the evening at rate of 20 ml/hr with advancement. Family desires full code for patient. Possible plans for PEG tube placement Friday with SNF upon discharge.   Labs and medications reviewed.   Diet Order:   Diet Order            Diet NPO time specified  Diet effective now                 EDUCATION NEEDS:   No education needs have been identified at this time  Skin:  Skin Assessment: Skin Integrity Issues: Skin Integrity Issues:: DTI DTI: buttocks  Last BM:  3/14  Height:   Ht Readings from Last 1 Encounters:  05/12/20 _0  (1.778 m)    Weight:   Wt Readings from Last 1 Encounters:  05/31/20 99.2 kg   BMI:  Body mass index is  31.38 kg/m.  Estimated Nutritional Needs:   Kcal:  2100-2300  Protein:  115-130 grams  Fluid:  >2 L/day  Corrin Parker, MS, RD, LDN RD pager number/after hours weekend pager number on Amion.

## 2020-05-31 NOTE — Progress Notes (Signed)
RN notified by lab of critical lab value: lactic acid 2.0. MD Jomarie Longs notified.

## 2020-05-31 NOTE — Progress Notes (Signed)
Pharmacy Antibiotic Note  Jason [name] is a 70 y.o. male with pneumonia.  Pharmacy has been consulted for zosyn and vancomycin dosing. -WBC= 9.9, tmax= 102.6 -SCr= 1.49, CrCl ~ 50  Plan: -zosyn 3.375gm IV q8h -vancomycin 2000 mg IV followed by 1000mg  IV q24h (estimated AUC= 464) -Will follow renal function, cultures and clinical progress    Height: 5\' 10"  (177.8 cm) Weight: 99.2 kg (218 lb 11.2 oz) IBW/kg (Calculated) : 73  Temp (24hrs), Avg:99.7 F (37.6 C), Min:97.9 F (36.6 C), Max:102.6 F (39.2 C)  Recent Labs  Lab 05/25/20 0313 05/26/20 0104 05/27/20 0407 05/28/20 0404 05/31/20 0331 05/31/20 0933  WBC 9.5 8.3  --  10.6* 9.9  --   CREATININE 0.86 0.82 0.80 0.88 1.49*  --   LATICACIDVEN  --   --   --   --   --  2.0*    Estimated Creatinine Clearance: 53.7 mL/min (A) (by C-G formula based on SCr of 1.49 mg/dL (H)).    No Known Allergies  Antimicrobials this admission: Ancef 2/25 >> 3/2 CTX 3/2 >>3/3 Zosyn 3/3 >> 3/8 Vanc 3/5 >> 3/8 Zosyn 3/16 Vanc 3/16  Dose adjustments this admission:   Microbiology results: 3/16 blood x2  Thank you for allowing pharmacy to be a part of this patient's care.  4/16, PharmD Clinical Pharmacist **Pharmacist phone directory can now be found on [email or web address] ([email or web address]).  Listed under [facility].

## 2020-06-01 DIAGNOSIS — I161 Hypertensive emergency: Secondary | ICD-10-CM | POA: Diagnosis not present

## 2020-06-01 DIAGNOSIS — J9602 Acute respiratory failure with hypercapnia: Secondary | ICD-10-CM | POA: Diagnosis not present

## 2020-06-01 DIAGNOSIS — L8992 Pressure ulcer of unspecified site, stage 2: Secondary | ICD-10-CM

## 2020-06-01 DIAGNOSIS — B379 Candidiasis, unspecified: Secondary | ICD-10-CM

## 2020-06-01 DIAGNOSIS — I483 Typical atrial flutter: Secondary | ICD-10-CM | POA: Diagnosis not present

## 2020-06-01 DIAGNOSIS — J9601 Acute respiratory failure with hypoxia: Secondary | ICD-10-CM | POA: Diagnosis not present

## 2020-06-01 DIAGNOSIS — I615 Nontraumatic intracerebral hemorrhage, intraventricular: Secondary | ICD-10-CM | POA: Diagnosis not present

## 2020-06-01 DIAGNOSIS — J189 Pneumonia, unspecified organism: Secondary | ICD-10-CM

## 2020-06-01 DIAGNOSIS — B49 Unspecified mycosis: Secondary | ICD-10-CM

## 2020-06-01 LAB — BLOOD CULTURE ID PANEL (REFLEXED) - BCID2

## 2020-06-01 LAB — CBC
HCT: 44 % (ref 39.0–52.0)
Hemoglobin: 14 g/dL (ref 13.0–17.0)
MCH: 28.3 pg (ref 26.0–34.0)
MCHC: 31.8 g/dL (ref 30.0–36.0)
MCV: 88.9 fL (ref 80.0–100.0)
Platelets: 108 10*3/uL — ABNORMAL LOW (ref 150–400)
RBC: 4.95 MIL/uL (ref 4.22–5.81)
RDW: 15.2 % (ref 11.5–15.5)
WBC: 7.3 10*3/uL (ref 4.0–10.5)
nRBC: 0 % (ref 0.0–0.2)

## 2020-06-01 LAB — GLUCOSE, CAPILLARY
Glucose-Capillary: 112 mg/dL — ABNORMAL HIGH (ref 70–99)
Glucose-Capillary: 143 mg/dL — ABNORMAL HIGH (ref 70–99)
Glucose-Capillary: 148 mg/dL — ABNORMAL HIGH (ref 70–99)
Glucose-Capillary: 149 mg/dL — ABNORMAL HIGH (ref 70–99)
Glucose-Capillary: 155 mg/dL — ABNORMAL HIGH (ref 70–99)
Glucose-Capillary: 183 mg/dL — ABNORMAL HIGH (ref 70–99)
Glucose-Capillary: 207 mg/dL — ABNORMAL HIGH (ref 70–99)

## 2020-06-01 LAB — PROCALCITONIN: Procalcitonin: 3.5 ng/mL

## 2020-06-01 LAB — BASIC METABOLIC PANEL
Anion gap: 10 (ref 5–15)
BUN: 51 mg/dL — ABNORMAL HIGH (ref 8–23)
CO2: 22 mmol/L (ref 22–32)
Calcium: 7.5 mg/dL — ABNORMAL LOW (ref 8.9–10.3)
Chloride: 112 mmol/L — ABNORMAL HIGH (ref 98–111)
Creatinine, Ser: 2.06 mg/dL — ABNORMAL HIGH (ref 0.61–1.24)
GFR, Estimated: 34 mL/min — ABNORMAL LOW (ref >60)
Glucose, Bld: 128 mg/dL — ABNORMAL HIGH (ref 70–99)
Potassium: 4.2 mmol/L (ref 3.5–5.1)
Sodium: 144 mmol/L (ref 135–145)

## 2020-06-01 MED ORDER — VORICONAZOLE 200 MG PO TABS
400.0000 mg | ORAL_TABLET | Freq: Two times a day (BID) | ORAL | Status: DC
  Filled 2020-06-01 (×2): qty 2

## 2020-06-01 MED ORDER — VORICONAZOLE 200 MG PO TABS
200.0000 mg | ORAL_TABLET | Freq: Two times a day (BID) | ORAL | Status: DC
  Administered 2020-06-02: 200 mg
  Filled 2020-06-01: qty 1

## 2020-06-01 MED ORDER — VORICONAZOLE 200 MG PO TABS
400.0000 mg | ORAL_TABLET | Freq: Two times a day (BID) | ORAL | Status: AC
  Administered 2020-06-01 (×2): 400 mg
  Filled 2020-06-01 (×2): qty 2

## 2020-06-01 MED ORDER — SODIUM CHLORIDE 0.9 % IV SOLN
200.0000 mg | Freq: Once | INTRAVENOUS | Status: DC
  Filled 2020-06-01: qty 200

## 2020-06-01 MED ORDER — METOPROLOL TARTRATE 25 MG PO TABS
25.0000 mg | ORAL_TABLET | Freq: Two times a day (BID) | ORAL | Status: DC
  Administered 2020-06-01 – 2020-06-05 (×9): 25 mg
  Filled 2020-06-01 (×9): qty 1

## 2020-06-01 MED ORDER — SODIUM CHLORIDE 0.9 % IV SOLN: 100.0000 mg | INTRAVENOUS | Status: DC

## 2020-06-01 MED ORDER — DILTIAZEM HCL 30 MG PO TABS
30.0000 mg | ORAL_TABLET | Freq: Three times a day (TID) | ORAL | Status: DC
  Administered 2020-06-01 – 2020-06-05 (×13): 30 mg
  Filled 2020-06-01 (×13): qty 1

## 2020-06-01 MED ORDER — SODIUM CHLORIDE 0.9 % IV SOLN: INTRAVENOUS | Status: DC

## 2020-06-01 MED ORDER — VORICONAZOLE 200 MG PO TABS: 200.0000 mg | ORAL_TABLET | Freq: Two times a day (BID) | ORAL | Status: DC

## 2020-06-01 MED ORDER — FREE WATER
300.0000 mL | Freq: Four times a day (QID) | Status: DC
  Administered 2020-06-01 – 2020-06-02 (×4): 300 mL

## 2020-06-01 NOTE — Plan of Care (Signed)
Patient is not progressing at this time due to altered mental status.   Problem: Education: Goal: Knowledge of disease or condition will improve Outcome: Not Progressing Goal: Knowledge of secondary prevention will improve Outcome: Not Progressing Goal: Knowledge of patient specific risk factors addressed and post discharge goals established will improve Outcome: Not Progressing Goal: Individualized Educational Video(s) Outcome: Not Progressing   Problem: Coping: Goal: Will verbalize positive feelings about self Outcome: Not Progressing Goal: Will identify appropriate support needs Outcome: Not Progressing   Problem: Health Behavior/Discharge Planning: Goal: Ability to manage health-related needs will improve Outcome: Not Progressing   Problem: Self-Care: Goal: Ability to participate in self-care as condition permits will improve Outcome: Not Progressing Goal: Verbalization of feelings and concerns over difficulty with self-care will improve Outcome: Not Progressing Goal: Ability to communicate needs accurately will improve Outcome: Not Progressing   Problem: Nutrition: Goal: Risk of aspiration will decrease Outcome: Not Progressing Goal: Dietary intake will improve Outcome: Not Progressing   Problem: Intracerebral Hemorrhage Tissue Perfusion: Goal: Complications of Intracerebral Hemorrhage will be minimized Outcome: Not Progressing   Problem: Education: Goal: Knowledge of General Education information will improve Description: Including pain rating scale, medication(s)/side effects and non-pharmacologic comfort measures Outcome: Not Progressing   Problem: Health Behavior/Discharge Planning: Goal: Ability to manage health-related needs will improve Outcome: Not Progressing   Problem: Clinical Measurements: Goal: Ability to maintain clinical measurements within normal limits will improve Outcome: Not Progressing Goal: Will remain free from infection Outcome: Not  Progressing Goal: Diagnostic test results will improve Outcome: Not Progressing Goal: Respiratory complications will improve Outcome: Not Progressing Goal: Cardiovascular complication will be avoided Outcome: Not Progressing   Problem: Coping: Goal: Level of anxiety will decrease Outcome: Not Progressing   Problem: Nutrition: Goal: Adequate nutrition will be maintained Outcome: Not Progressing   Problem: Activity: Goal: Risk for activity intolerance will decrease Outcome: Not Progressing

## 2020-06-01 NOTE — Consult Note (Signed)
Date of Admission:  05/11/2020          Reason for Consult: candidemia   Referring Provider: CHAMP "auto consult" and Dr. Broadus John   Assessment:  1. Candida glabrata fungemia 2. Left thalamic hemorrhage and intraventricular extension and intraparenchymal hemorrhage in setting of anticoagulation and hypertensive emergency  3. status post neurosurgery with external ventricular drain from February 25 through March 5 4. Encephalopathy 5. Respiratory failure with hypoxia and possible pneumonia on Zosyn 6. Atrial flutter 7. Diabetes mellitus 8. Coronary artery disease  Plan:  1. Start voriconazole in case his Candida is something that seeded his central nervous system given encephalopathy and recent neurosurgery 2. Repeat blood cultures tomorrow 3. Would get MRI brain if possible 4. He will need a dedicated funduscopic exam by ophthalmology to exclude fungal endophthalmitis.(though given his trajectory this is not urgent) 5. Will get TTE  Active Problems:   Pressure injury of skin   IVH (intraventricular hemorrhage) (HCC)   Altered mental status   Encounter for intubation   Hypertensive emergency   Atrial flutter ([place])   Scheduled Meds: . amantadine  100 mg Per Tube BID  . atorvastatin  40 mg Per NG tube q1800  . chlorhexidine gluconate (MEDLINE KIT)  15 mL Mouth Rinse BID  . diltiazem  30 mg Per Tube Q8H  . enoxaparin (LOVENOX) injection  40 mg Subcutaneous Daily  . feeding supplement (PROSource TF)  45 mL Per Tube TID  . free water  300 mL Per Tube Q6H  . insulin aspart  0-20 Units Subcutaneous Q4H  . levETIRAcetam  750 mg Per Tube BID  . mouth rinse  15 mL Mouth Rinse q12n4p  . metoprolol tartrate  25 mg Per Tube BID  . pantoprazole sodium  40 mg Per Tube Daily  . thiamine  100 mg Per Tube Daily  . [START ON 06/02/2020] voriconazole  200 mg Per Tube Q12H  . voriconazole  400 mg Per Tube Q12H   Continuous Infusions: . sodium chloride    . sodium chloride 75 mL/hr  at 06/01/20 0840  . feeding supplement (JEVITY 1.5 CAL/FIBER) 1,000 mL (06/01/20 0930)  . piperacillin-tazobactam (ZOSYN)  IV 3.375 g (06/01/20 1350)   PRN Meds:.acetaminophen **OR** acetaminophen (TYLENOL) oral liquid 160 mg/5 mL **OR** acetaminophen, Gerhardt's butt cream, morphine injection, ondansetron (ZOFRAN) IV, polyethylene glycol, senna-docusate  HPI: Jason [name] is a 70 y.o. male with history of coronary artery disease diabetes mellitus paroxysmal atrial fibrillation stroke hypertension systolic heart failure who was admitted the ICU on February 24 with a 1.2 cm thalamic hemorrhage with intraventricular extension in the context of a hypertensive emergency and Eliquis.  He received [place] for Eliquis reversal.  He was intubated and an external ventricular drain was placed on 25 February.  He was extubated on 1 March.  EVD was dislodged on 5 March.  He was reintubated on the fifth and then extubated to BiPAP on the eighth.  He was transferred to tried hospital service on the 12th.  He is remained encephalopathic and been seen by palliative care.  He became febrile to 102.8 and was started on vancomycin and Zosyn.  Chest x-ray showed possible left lower lobe infiltrate and he has been narrowed to Zosyn.  In the interim blood cultures returned positive for Candida glabrata.  I cannot find record of him having had a central line during his hospital stay.  He does not have evidence of phlebitis looking at his lines.  Will initiate voriconazole  in case he has Candida glabrata infection of the central nervous system given his recent EVD drain.  We will consider MRI of the brain if this is possible.  If his trajectory improves he certainly needs to have a funduscopic eye exam to exclude fungal endophthalmitis.  We will obtain repeat blood cultures tomorrow and also obtain a 2D echocardiogram.   Review of Systems: Review of Systems  Unable to perform ROS: Medical condition    Past Medical  History:  Diagnosis Date  . Anxiety   . Coronary artery disease   . Depression   . Diabetes mellitus without complication ([place])   . Dysrhythmia   . Hypertension   . Myocardial infarction ([place])   . Stroke [facility])     Social History   Tobacco Use  . Smoking status: Never Smoker  . Smokeless tobacco: Never Used  Vaping Use  . Vaping Use: Never used  Substance Use Topics  . Alcohol use: Not Currently  . Drug use: Not Currently    No family history on file. No Known Allergies  OBJECTIVE: Blood pressure (!) 122/97, pulse 100, temperature 97.9 F (36.6 C), temperature source Oral, resp. rate (!) 37, height $RemoveBe'5\' 10"'iftsvlqgs$  (1.778 m), weight 107 kg, SpO2 100 %.  Physical Exam Constitutional:      General: He is not in acute distress. HENT:     Nose:     Comments: Feeding tube Cardiovascular:     Rate and Rhythm: Tachycardia present. Rhythm irregular.     Heart sounds: No murmur heard. No friction rub.  Pulmonary:     Effort: Pulmonary effort is normal. No respiratory distress.     Breath sounds: No stridor. No wheezing or rhonchi.  Neurological:     Comments: He did follow commands such as shutting his eyes when asked him to and track to me tried to verbalize words but was not comprehensible.  Also gesturing to his head   Site of ventricular drain is not obviously infected.  Peripheral IVs also do not have evidence of phlebitis.  Lab Results Lab Results  Component Value Date   WBC 7.3 06/01/2020   HGB 14.0 06/01/2020   HCT 44.0 06/01/2020   MCV 88.9 06/01/2020   PLT 108 (L) 06/01/2020    Lab Results  Component Value Date   CREATININE 2.06 (H) 06/01/2020   BUN 51 (H) 06/01/2020   NA 144 06/01/2020   K 4.2 06/01/2020   CL 112 (H) 06/01/2020   CO2 22 06/01/2020    Lab Results  Component Value Date   ALT 46 (H) 05/31/2020   AST 35 05/31/2020   ALKPHOS 66 05/31/2020   BILITOT 0.6 05/31/2020     Microbiology: Recent Results (from the past 240 hour(s))  Culture,  blood (routine x 2)     Status: Abnormal (Preliminary result)   Collection Time: 05/31/20  9:28 AM   Specimen: BLOOD  Result Value Ref Range Status   Specimen Description BLOOD LEFT ANTECUBITAL  Final   Special Requests   Final    BOTTLES DRAWN AEROBIC AND ANAEROBIC Blood Culture results may not be optimal due to an inadequate volume of blood received in culture bottles   Culture  Setup Time (A)  Final    YEAST ANAEROBIC BOTTLE ONLY CRITICAL RESULT CALLED TO, READ BACK BY AND VERIFIED WITH: Gracy Bruins 1326 [id number] FCP Performed at [facility], [place] [address], [place],  [address]    Culture YEAST  Final  Report Status PENDING  Incomplete  Blood Culture ID Panel (Reflexed)     Status: Abnormal   Collection Time: 05/31/20  9:28 AM  Result Value Ref Range Status   Enterococcus faecalis NOT DETECTED NOT DETECTED Final   Enterococcus Faecium NOT DETECTED NOT DETECTED Final   Listeria monocytogenes NOT DETECTED NOT DETECTED Final   Staphylococcus species NOT DETECTED NOT DETECTED Final   Staphylococcus aureus (BCID) NOT DETECTED NOT DETECTED Final   Staphylococcus epidermidis NOT DETECTED NOT DETECTED Final   Staphylococcus lugdunensis NOT DETECTED NOT DETECTED Final   Streptococcus species NOT DETECTED NOT DETECTED Final   Streptococcus agalactiae NOT DETECTED NOT DETECTED Final   Streptococcus pneumoniae NOT DETECTED NOT DETECTED Final   Streptococcus pyogenes NOT DETECTED NOT DETECTED Final   A.calcoaceticus-baumannii NOT DETECTED NOT DETECTED Final   Bacteroides fragilis NOT DETECTED NOT DETECTED Final   Enterobacterales NOT DETECTED NOT DETECTED Final   Enterobacter cloacae complex NOT DETECTED NOT DETECTED Final   Escherichia coli NOT DETECTED NOT DETECTED Final   Klebsiella aerogenes NOT DETECTED NOT DETECTED Final   Klebsiella oxytoca NOT DETECTED NOT DETECTED Final   Klebsiella pneumoniae NOT DETECTED NOT DETECTED Final   Proteus species NOT DETECTED NOT  DETECTED Final   Salmonella species NOT DETECTED NOT DETECTED Final   Serratia marcescens NOT DETECTED NOT DETECTED Final   Haemophilus influenzae NOT DETECTED NOT DETECTED Final   Neisseria meningitidis NOT DETECTED NOT DETECTED Final   Pseudomonas aeruginosa NOT DETECTED NOT DETECTED Final   Stenotrophomonas maltophilia NOT DETECTED NOT DETECTED Final   Candida albicans NOT DETECTED NOT DETECTED Final   Candida auris NOT DETECTED NOT DETECTED Final   Candida glabrata DETECTED (A) NOT DETECTED Final    Comment: CRITICAL RESULT CALLED TO, READ BACK BY AND VERIFIED WITH: PHARMD THOMAS J. 1326 [id number] FCP    Candida krusei NOT DETECTED NOT DETECTED Final   Candida parapsilosis NOT DETECTED NOT DETECTED Final   Candida tropicalis NOT DETECTED NOT DETECTED Final   Cryptococcus neoformans/gattii NOT DETECTED NOT DETECTED Final    Comment: Performed at [facility], [address], [place], [place] [address]  Culture, blood (routine x 2)     Status: None (Preliminary result)   Collection Time: 05/31/20  9:36 AM   Specimen: BLOOD LEFT HAND  Result Value Ref Range Status   Specimen Description BLOOD LEFT HAND  Final   Special Requests   Final    BOTTLES DRAWN AEROBIC AND ANAEROBIC Blood Culture results may not be optimal due to an inadequate volume of blood received in culture bottles   Culture  Setup Time   Final    YEAST ANAEROBIC BOTTLE ONLY CRITICAL VALUE NOTED.  VALUE IS CONSISTENT WITH PREVIOUSLY REPORTED AND CALLED VALUE. Performed at [facility], [place] [address], [place], [place] [address]    Culture YEAST  Final   Report Status PENDING  Incomplete  MRSA PCR Screening     Status: None   Collection Time: 05/31/20  3:52 PM   Specimen: Nasal Mucosa; Nasopharyngeal  Result Value Ref Range Status   MRSA by PCR NEGATIVE NEGATIVE Final    Comment:        The GeneXpert MRSA Assay (FDA approved for NASAL specimens only), is one component of a comprehensive MRSA  colonization surveillance program. It is not intended to diagnose MRSA infection nor to guide or monitor treatment for MRSA infections. Performed at [facility], [place] [address], [place], [place] [address]  Alcide Evener, [facility] [phone] pager  06/01/2020, 3:20 PM

## 2020-06-01 NOTE — Progress Notes (Signed)
PHARMACY - PHYSICIAN COMMUNICATION CRITICAL VALUE ALERT - BLOOD CULTURE IDENTIFICATION (BCID)  Jason [name] is an 70 y.o. male who presented to [facility] on 05/11/2020 with a chief complaint of AMS.   Assessment: Blood cultures growing yeast in 1/4 bottles. BCID reporting Candida glabrata. Patient noted to have ICH on CT and possible seizure incident during admission. Concerned for CNS involvement with yeast infection.   Name of physician (or Provider) Contacted: Gwynn Burly, MD   Current antibiotics: Zosyn  Changes to prescribed antibiotics recommended:  Consider voriconazole. ID service to review patient and make decision on medications   Results for orders placed or performed during the hospital encounter of 05/11/20  Blood Culture ID Panel (Reflexed) (Collected: 05/31/2020  9:28 AM)  Result Value Ref Range   Enterococcus faecalis NOT DETECTED NOT DETECTED   Enterococcus Faecium NOT DETECTED NOT DETECTED   Listeria monocytogenes NOT DETECTED NOT DETECTED   Staphylococcus species NOT DETECTED NOT DETECTED   Staphylococcus aureus (BCID) NOT DETECTED NOT DETECTED   Staphylococcus epidermidis NOT DETECTED NOT DETECTED   Staphylococcus lugdunensis NOT DETECTED NOT DETECTED   Streptococcus species NOT DETECTED NOT DETECTED   Streptococcus agalactiae NOT DETECTED NOT DETECTED   Streptococcus pneumoniae NOT DETECTED NOT DETECTED   Streptococcus pyogenes NOT DETECTED NOT DETECTED   A.calcoaceticus-baumannii NOT DETECTED NOT DETECTED   Bacteroides fragilis NOT DETECTED NOT DETECTED   Enterobacterales NOT DETECTED NOT DETECTED   Enterobacter cloacae complex NOT DETECTED NOT DETECTED   Escherichia coli NOT DETECTED NOT DETECTED   Klebsiella aerogenes NOT DETECTED NOT DETECTED   Klebsiella oxytoca NOT DETECTED NOT DETECTED   Klebsiella pneumoniae NOT DETECTED NOT DETECTED   Proteus species NOT DETECTED NOT DETECTED   Salmonella species NOT DETECTED NOT DETECTED   Serratia marcescens NOT  DETECTED NOT DETECTED   Haemophilus influenzae NOT DETECTED NOT DETECTED   Neisseria meningitidis NOT DETECTED NOT DETECTED   Pseudomonas aeruginosa NOT DETECTED NOT DETECTED   Stenotrophomonas maltophilia NOT DETECTED NOT DETECTED   Candida albicans NOT DETECTED NOT DETECTED   Candida auris NOT DETECTED NOT DETECTED   Candida glabrata DETECTED (A) NOT DETECTED   Candida krusei NOT DETECTED NOT DETECTED   Candida parapsilosis NOT DETECTED NOT DETECTED   Candida tropicalis NOT DETECTED NOT DETECTED   Cryptococcus neoformans/gattii NOT DETECTED NOT DETECTED   Trixie Rude, PharmD PGY1 Acute Care Pharmacy Resident 06/01/2020 1:34 PM  Please check [email or web address] for unit-specific pharmacy phone numbers.

## 2020-06-01 NOTE — Progress Notes (Addendum)
PROGRESS NOTE    Jason [name]   LFY:[id number]  DOB: Dec 02, 1950  PCP: Jodi Marble, MD    DOA: 05/11/2020 LOS: 79   Brief Narrative   70 year old M with PMH of CAD, DM-2, PAF on Eliquis, CVA, HTN and systolic CHF admitted to ICU on 05/11/2020 with >1.2cm thalamic hemorrhage with intraventricular extension in the setting of hypertensive emergency.  He received Kcentra for Eliquis reversal.  He was intubated and had external ventricular drain placed on 05/12/2020.  He was extubated on 05/16/2020.  EVD dislodged on 05/20/2020.  He was reintubated on 3/5, and eventually extubated to BiPAP on 3/8.  He was transferred to [facility] service on 3/12, Clinical course since then, patient continues to be persistently encephalopathic, in semi-vegetative state.  Palliative care consulted. 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 Fib -Received KCentra for Eliquis reversal in ED -Followed by neurosurgery and neurology, external ventricular drain from 2/25-3/5.  -EEG with generalized slowing consistent with moderate to severe diffuse encephalopathy -Continued on Keppra for seizure prophylaxis -Resume tube feeds via cortrak -PEG placement is pending -Neurology following  -Palliative care consulted and following, prognosis remains poor with no improvement in mental status over the past 3 weeks  Sepsis 3/16 -Fever of 102.8, suspect aspiration pneumonia, has increased scattered rhonchi on exam, chest x-ray yesterday was relatively unchanged we will repeat  this tomorrow  -Blood cultures remain negative so far, urine and the condom cath remains clear -will discontinue vancomycin and continue IV Zosyn today  -Creatinine is worsening, continue IV fluids normal saline at 75 mL/h   Acute respiratory failure with hypoxia  -Secondary to hydrocephalus and intracranial hemorrhage, - intubation and mechanical ventilation from 2/25-3/1, reintubated 3/5-3/8 --Appreciate CCM input, remains at risk of aspirating his secretions in the setting of persistent encephalopathy  Persistent A. fib/A flutter: - Rate has been controlled mostly in 60's. Was on Eliquis POA. -Eliquis discontinued in the setting of intracranial hemorrhage -Pressure more stable now, will restart Cardizem and metoprolol held at lower dose  Chronic systolic CHF:  - LVEF 45 to 50%.  - no overt fluid overload. -Monitor off diuretics at this time  Hypernatremia:  -Improving, continue free water flushes  Hypertensive emergency/uncontrolled hypertension:  BP 205/120 on admit.   Hypotensive yesterday, now normotensive, restarting Cardizem and metoprolol  History of CAD: Stable -Cardiac meds as above.  Uncontrolled IDDM-2: A1c 9.4% -Hypoglycemic yesterday in the setting of sepsis, Lantus and Premeal NovoLog on hold, restart tomorrow   Physical deconditioning -Remains encephalopathic with poor progression  Goal of care: Significant comorbidity as above, and  poor prognosis.   -Several of my partners have recommended against aggressive interventions including resuscitation with patient's son, seen by palliative care, status post family meetings -Son continues to request full scope of care at this time -Unable to reach son yesterday, will reattempt this afternoon  Pressure skin injury: Stage II Pressure Injury 05/24/20 Buttocks Bilateral;Medial Deep Tissue Pressure Injury - Purple or maroon localized area of discolored intact skin or blood-filled blister due to damage of  underlying soft tissue from pressure and/or shear.  Multiple blisters along (Active)  05/24/20 0800  Location: Buttocks  Location Orientation: Bilateral;Medial  Staging: Deep Tissue Pressure Injury - Purple or maroon localized area of discolored intact skin or blood-filled blister due to damage of underlying soft tissue from pressure and/or shear.  Wound Description (Comments): Multiple blisters along with deep tissue injury.  Present on Admission:      Obesity: Body mass index is 33.86 kg/m.  Complicates overall care and prognosis.  DVT prophylaxis: Lovenox   CODE STATUS: full code  Subjective 06/01/20    -Less obtunded today, opens eyes, does not follow any commands   Disposition Plan & Communication   Status is: Inpatient  Inpatient status is appropriate due to severity of illness  Dispo: The patient is from: Home              Anticipated d/c is to: SNF              Patient currently is not medically stable for d/c   Difficult to place patient - No   Consults, Procedures, Significant Events   Consultants:  Neurosurgery Intensivist Cardiology Neurology Palliative medicine  Procedures:  ETT 2/25 >> 3/1, 3/5 >> 3/8 EVD 2/25 >> 3/5  Antimicrobials:  Anti-infectives (From admission, onward)   Start     Dose/Rate Route Frequency Ordered Stop   06/01/20 1300  vancomycin (VANCOREADY) IVPB 1000 mg/200 mL  Status:  Discontinued        1,000 mg 200 mL/hr over 60 Minutes Intravenous Every 24 hours 05/31/20 1323 06/01/20 0928   05/31/20 1900  piperacillin-tazobactam (ZOSYN) IVPB 3.375 g        3.375 g 12.5 mL/hr over 240 Minutes Intravenous Every 8 hours 05/31/20 1012     05/31/20 1100  vancomycin (VANCOREADY) IVPB 2000 mg/400 mL        2,000 mg 200 mL/hr over 120 Minutes Intravenous  Once 05/31/20 1012 05/31/20 1634   05/31/20 1100  piperacillin-tazobactam (ZOSYN) IVPB 3.375 g        3.375 g 100 mL/hr over 30 Minutes Intravenous  Once 05/31/20 1012 05/31/20 1406    05/21/20 1100  vancomycin (VANCOREADY) IVPB 1750 mg/350 mL  Status:  Discontinued        1,750 mg 175 mL/hr over 120 Minutes Intravenous Every 24 hours 05/20/20 0953 05/23/20 1046   05/20/20 1045  vancomycin (VANCOREADY) IVPB 2000 mg/400 mL        2,000 mg 200 mL/hr over 120 Minutes Intravenous  Once 05/20/20 0948 05/20/20 1341   05/19/20 1015  cefTRIAXone (ROCEPHIN) 2 g in sodium chloride 0.9 % 100 mL IVPB  Status:  Discontinued        2 g 200 mL/hr over 30 Minutes Intravenous Every 24 hours 05/18/20 0958 05/18/20 1132   05/18/20 1400  piperacillin-tazobactam (ZOSYN) IVPB 3.375 g  Status:  Discontinued        3.375 g 12.5 mL/hr over 240 Minutes Intravenous Every 8 hours 05/18/20 1132 05/23/20 1046   05/17/20 1015  cefTRIAXone (ROCEPHIN) 1 g in sodium chloride 0.9 % 100 mL IVPB  Status:  Discontinued        1 g 200 mL/hr over 30 Minutes Intravenous Every 24 hours 05/17/20 0918 05/18/20 0958   05/12/20 1530  ceFAZolin (ANCEF) IVPB 1 g/50 mL premix  Status:  Discontinued        1 g 100 mL/hr over 30 Minutes Intravenous Every 8 hours 05/12/20 1440 05/17/20 0918        Micro  Objective   Vitals:   06/01/20 0354 06/01/20 0404 06/01/20 0758 06/01/20 0900  BP: 128/80  123/88   Pulse: 91  (!) 106   Resp:   (!) 40   Temp: 98.4 F (36.9 C)   97.7 F (36.5 C)  TempSrc: Oral   Oral  SpO2: 99%  97%   Weight:  107 kg    Height:        Intake/Output Summary (Last 24 hours) at 06/01/2020 1119 Last data filed at 06/01/2020 1021 Gross per 24 hour  Intake 1703.79 ml  Output 1275 ml  Net 428.79 ml   Filed Weights   05/30/20 0433 05/31/20 0417 06/01/20 0404  Weight: 99.8 kg 99.2 kg 107 kg    Physical Exam: Chronically ill elderly male laying in bed, poorly responsive, opens eyes and withdraws to painful stimuli, does not follow commands HEENT: Pupils are equal and reactive CVS: S1-S2, regular rhythm, tachycardic Lungs: Few scattered rhonchi Abdomen: Soft, nontender bowel sounds  present Extremities: No edema, offloading boots on Neuro: Minimally responsive, opens eyes, does not follow commands, pupils are equal and reactive   Labs   Data Reviewed: I have personally reviewed following labs and imaging studies  CBC: Recent Labs  Lab 05/26/20 0104 05/28/20 0404 05/31/20 0331 06/01/20 0435  WBC 8.3 10.6* 9.9 7.3  HGB 13.8 14.7 14.0 14.0  HCT 44.8 47.7 44.8 44.0  MCV 90.5 90.7 90.0 88.9  PLT 256 263 219 482*   Basic Metabolic Panel: Recent Labs  Lab 05/26/20 0104 05/27/20 0407 05/28/20 0404 05/31/20 0331 06/01/20 0435  NA 151* 153* 150* 149* 144  K 4.3 3.8 4.0 3.8 4.2  CL 118* 118* 114* 112* 112*  CO2 $Re'26 28 26 28 22  'sMv$ GLUCOSE 214* 97 62* 78 128*  BUN 27* 26* 27* 42* 51*  CREATININE 0.82 0.80 0.88 1.49* 2.06*  CALCIUM 8.2* 8.4* 8.3* 8.1* 7.5*  MG  --   --  2.6* 2.8*  --   PHOS  --   --  4.9*  --   --    GFR: Estimated Creatinine Clearance: 40.3 mL/min (A) (by C-G formula based on SCr of 2.06 mg/dL (H)). Liver Function Tests: Recent Labs  Lab 05/28/20 0404 05/31/20 0331  AST 42* 35  ALT 67* 46*  ALKPHOS 68 66  BILITOT 0.5 0.6  PROT 6.7 6.4*  ALBUMIN 2.1* 2.0*   No results for input(s): LIPASE, AMYLASE in the last 168 hours. No results for input(s): AMMONIA in the last 168 hours. Coagulation Profile: No results for input(s): INR, PROTIME in the last 168 hours. Cardiac Enzymes: No results for input(s): CKTOTAL, CKMB, CKMBINDEX, TROPONINI in the last 168 hours. BNP (last 3 results) No results for input(s): PROBNP in the last 8760 hours. HbA1C: No results for input(s): HGBA1C in the last 72 hours. CBG: Recent Labs  Lab 05/31/20 1548 05/31/20 2038 06/01/20 0001 06/01/20 0352 06/01/20 0756  GLUCAP 110* 180* 143* 112* 149*   Lipid Profile: No results for input(s): CHOL, HDL, LDLCALC, TRIG, CHOLHDL, LDLDIRECT in the last 72 hours. Thyroid Function Tests: No results for input(s): TSH, T4TOTAL, FREET4, T3FREE, THYROIDAB in the last  72 hours. Anemia Panel: No results for input(s): VITAMINB12, FOLATE, FERRITIN, TIBC, IRON, RETICCTPCT in the last 72 hours. Sepsis Labs: Recent Labs  Lab 05/31/20 0933 05/31/20 1227 05/31/20 1534 06/01/20 0435  PROCALCITON  --   --  2.35 3.50  LATICACIDVEN 2.0* 2.1*  --   --     Recent Results (from  the past 240 hour(s))  Culture, blood (routine x 2)     Status: None (Preliminary result)   Collection Time: 05/31/20  9:28 AM   Specimen: BLOOD  Result Value Ref Range Status   Specimen Description BLOOD LEFT ANTECUBITAL  Final   Special Requests   Final    BOTTLES DRAWN AEROBIC AND ANAEROBIC Blood Culture results may not be optimal due to an inadequate volume of blood received in culture bottles   Culture   Final    NO GROWTH < 24 HOURS Performed at [facility], [place] [address] [place], [place] [address]    Report Status PENDING  Incomplete  Culture, blood (routine x 2)     Status: None (Preliminary result)   Collection Time: 05/31/20  9:36 AM   Specimen: BLOOD LEFT HAND  Result Value Ref Range Status   Specimen Description BLOOD LEFT HAND  Final   Special Requests   Final    BOTTLES DRAWN AEROBIC AND ANAEROBIC Blood Culture results may not be optimal due to an inadequate volume of blood received in culture bottles   Culture   Final    NO GROWTH < 24 HOURS Performed at [facility], [place] [address], [place], [place] [address]    Report Status PENDING  Incomplete  MRSA PCR Screening     Status: None   Collection Time: 05/31/20  3:52 PM   Specimen: Nasal Mucosa; Nasopharyngeal  Result Value Ref Range Status   MRSA by PCR NEGATIVE NEGATIVE Final    Comment:        The GeneXpert MRSA Assay (FDA approved for NASAL specimens only), is one component of a comprehensive MRSA colonization surveillance program. It is not intended to diagnose MRSA infection nor to guide or monitor treatment for MRSA infections. Performed at [facility], [place] [address],  [place], [place] [address]       Imaging Studies   CT HEAD WO CONTRAST  Result Date: 05/31/2020 CLINICAL DATA:  Mental status change.  Sepsis. EXAM: CT HEAD WITHOUT CONTRAST TECHNIQUE: Contiguous axial images were obtained from the base of the skull through the vertex without intravenous contrast. COMPARISON:  March 6 22. FINDINGS: Brain: Decreased intraventricular hemorrhage layering within the occipital horns bilaterally. No evidence of new hemorrhage. No evidence of acute large vascular territory infarct. Similar patchy white matter hypodensities, most likely related to chronic microvascular ischemic disease. Similar sequela of prior right frontal ventriculostomy with resolution of the previously seen hemorrhage along the tract. Vascular: Calcific atherosclerosis. No hyperdense vessel identified. Skull: Right frontal burr hole.  No acute fracture. Sinuses/Orbits: Opacification of a left ethmoid air cell. Mucosal thickening of the right sphenoid sinus with air-fluid level and frothy secretions. Unremarkable orbits. Other: Trace bilateral mastoid effusions. IMPRESSION: 1. Decreased intraventricular hemorrhage layering within the occipital horns bilaterally. Similar ventriculomegaly. 2. Paranasal sinus mucosal thickening with frothy secretions and air-fluid level in the right sphenoid sinus. Electronically Signed   By: Margaretha Sheffield MD   On: 05/31/2020 11:10   DG CHEST PORT 1 VIEW  Result Date: 05/31/2020 CLINICAL DATA:  Fever, altered mental status EXAM: PORTABLE CHEST 1 VIEW COMPARISON:  05/27/2020 FINDINGS: Feeding tube traverses esophagus into stomach. 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 Atherosclerotic calcification aorta. Mediastinal contours and pulmonary vascularity normal. Persistent infiltrate versus atelectasis at LEFT base. Remaining lungs clear. No pleural effusion or pneumothorax. Skin fold projects over LEFT upper chest. Old BILATERAL rib fractures. IMPRESSION:  Persistent LEFT basilar opacity  which could represent atelectasis or infiltrate. Electronically Signed   By: Lavonia Dana M.D.   On: 05/31/2020 08:44     Medications   Scheduled Meds: . amantadine  100 mg Per Tube BID  . atorvastatin  40 mg Per NG tube q1800  . chlorhexidine gluconate (MEDLINE KIT)  15 mL Mouth Rinse BID  . enoxaparin (LOVENOX) injection  40 mg Subcutaneous Daily  . feeding supplement (PROSource TF)  45 mL Per Tube TID  . free water  300 mL Per Tube Q3H  . insulin aspart  0-20 Units Subcutaneous Q4H  . levETIRAcetam  750 mg Per Tube BID  . mouth rinse  15 mL Mouth Rinse q12n4p  . pantoprazole sodium  40 mg Per Tube Daily  . thiamine  100 mg Per Tube Daily   Continuous Infusions: . sodium chloride    . sodium chloride 75 mL/hr at 06/01/20 0840  . feeding supplement (JEVITY 1.5 CAL/FIBER) 1,000 mL (06/01/20 0930)  . piperacillin-tazobactam (ZOSYN)  IV 3.375 g (06/01/20 0344)       LOS: 21 days    Time spent: 40 minutes     Domenic Polite, MD [facility]  06/01/2020, 11:19 AM

## 2020-06-01 NOTE — Progress Notes (Signed)
STROKE TEAM PROGRESS NOTE   SUBJECTIVE (INTERVAL HISTORY) Patient is drowsy but can be easily aroused and follows simple commands.  White count has come down to 7.3.  He is growing Candida in one of the blood cultures  .  Still has increased work of breathing.  Neurological exam is unchanged.  Vital signs are stable.  Palliative care team has spoken to the son. CBC    Component Value Date/Time   WBC 7.3 06/01/2020 0435   RBC 4.95 06/01/2020 0435   HGB 14.0 06/01/2020 0435   HCT 44.0 06/01/2020 0435   PLT 108 (L) 06/01/2020 0435   MCV 88.9 06/01/2020 0435   MCH 28.3 06/01/2020 0435   MCHC 31.8 06/01/2020 0435   RDW 15.2 06/01/2020 0435   LYMPHSABS 1.0 05/18/2020 0920   MONOABS 1.3 (H) 05/18/2020 0920   EOSABS 0.0 05/18/2020 0920   BASOSABS 0.1 05/18/2020 0920   BMP Latest Ref Rng & Units 06/01/2020 05/31/2020 05/28/2020  Glucose 70 - 99 mg/dL 128(H) 78 62(L)  BUN 8 - 23 mg/dL 51(H) 42(H) 27(H)  Creatinine 0.61 - 1.24 mg/dL 2.06(H) 1.49(H) 0.88  Sodium 135 - 145 mmol/L 144 149(H) 150(H)  Potassium 3.5 - 5.1 mmol/L 4.2 3.8 4.0  Chloride 98 - 111 mmol/L 112(H) 112(H) 114(H)  CO2 22 - 32 mmol/L _0 Calcium 8.9 - 10.3 mg/dL 7.5(L) 8.1(L) 8.3(L)    IMAGING past 24 hours  EEG adult Result Date: 05/14/2020  IMPRESSION: This study is suggestive of moderate to evere diffuse encephalopathy, nonspecific etiology. No seizures or epileptiform discharges were seen throughout the recording.  PHYSICAL EXAM Temp:  [97.3 F (36.3 C)-99.2 F (37.3 C)] 97.9 F (36.6 C) (03/17 1223) Pulse Rate:  [78-106] 102 (03/17 1126) Resp:  [37-45] 37 (03/17 1126) BP: (111-130)/(62-88) 111/80 (03/17 1126) SpO2:  [90 %-100 %] 100 % (03/17 1126) Weight:  [107 kg] 107 kg (03/17 0404) Essentially  exam not significantly changed from yesterday-at different times as slightly different   levels of wakefulness. General: Comfortably laying in bed and sleeping. HEENT: Normocephalic atraumatic Cardiovascular:  Irregularly irregular Respiratory: Scattered rales and rhonchi with mildly increased effort of breathing Abdomen: Mildly tender,  extremities warm well perfused  . Neurological Exam Lethargic Arouses easily with stimulation Open eyes Was able to able to follow only occasional midline commands.   Was not able to verbalize his name today Inconsistent blink to threat from both sides Withdraws all 4 extremities slightly to noxious stimuli.  MED LIST  Current Facility-Administered Medications:  .  0.9 %  sodium chloride infusion, 250 mL, Intravenous, Continuous, Clark, Laura P, DO .  0.9 %  sodium chloride infusion, , Intravenous, Continuous, Domenic Polite, MD, Last Rate: 75 mL/hr at 06/01/20 0840, New Bag at 06/01/20 0840 .  acetaminophen (TYLENOL) tablet 650 mg, 650 mg, Oral, Q4H PRN **OR** acetaminophen (TYLENOL) 160 MG/5ML solution 650 mg, 650 mg, Per Tube, Q4H PRN, 650 mg at 05/31/20 1351 **OR** acetaminophen (TYLENOL) suppository 650 mg, 650 mg, Rectal, Q4H PRN, Bhagat, Srishti L, MD, 650 mg at 05/31/20 0816 .  amantadine (SYMMETREL) 50 MG/5ML solution 100 mg, 100 mg, Per Tube, BID, Cyndia Skeeters, Taye T, MD, 100 mg at 06/01/20 0917 .  atorvastatin (LIPITOR) tablet 40 mg, 40 mg, Per NG tube, q1800, Olivencia-Simmons, Ivelisse, NP, 40 mg at 05/31/20 1702 .  chlorhexidine gluconate (MEDLINE KIT) (PERIDEX) 0.12 % solution 15 mL, 15 mL, Mouth Rinse, BID, Clark, Laura P, DO, 15 mL at 06/01/20 0930 .  diltiazem (CARDIZEM)  tablet 30 mg, 30 mg, Per Tube, Q8H, Domenic Polite, MD .  enoxaparin (LOVENOX) injection 40 mg, 40 mg, Subcutaneous, Daily, Garvin Fila, MD, 40 mg at 06/01/20 0919 .  feeding supplement (JEVITY 1.5 CAL/FIBER) liquid 1,000 mL, 1,000 mL, Per Tube, Continuous, Domenic Polite, MD, Last Rate: 55 mL/hr at 06/01/20 0930, 1,000 mL at 06/01/20 0930 .  feeding supplement (PROSource TF) liquid 45 mL, 45 mL, Per Tube, TID, Spero Geralds, MD, 45 mL at 06/01/20 0918 .  free water 300 mL,  300 mL, Per Tube, Q6H, Domenic Polite, MD .  Gerhardt's butt cream, , Topical, PRN, Merlene Laughter F, NP, 1 application at 46/65/99 1437 .  insulin aspart (novoLOG) injection 0-20 Units, 0-20 Units, Subcutaneous, Q4H, Magdalen Spatz, NP, 3 Units at 06/01/20 1216 .  levETIRAcetam (KEPPRA) 100 MG/ML solution 750 mg, 750 mg, Per Tube, BID, Amie Portland, MD, 750 mg at 06/01/20 0917 .  MEDLINE mouth rinse, 15 mL, Mouth Rinse, q12n4p, Amie Portland, MD, 15 mL at 06/01/20 1227 .  metoprolol tartrate (LOPRESSOR) tablet 25 mg, 25 mg, Per Tube, BID, Domenic Polite, MD .  morphine 2 MG/ML injection 2 mg, 2 mg, Intravenous, Q4H PRN, Dellinger, Marianne L, PA-C, 2 mg at 06/01/20 0057 .  ondansetron (ZOFRAN) injection 4 mg, 4 mg, Intravenous, Q6H PRN, Kipp Brood, MD, 4 mg at 05/19/20 1339 .  pantoprazole sodium (PROTONIX) 40 mg/20 mL oral suspension 40 mg, 40 mg, Per Tube, Daily, Julian Hy, DO, 40 mg at 06/01/20 0918 .  piperacillin-tazobactam (ZOSYN) IVPB 3.375 g, 3.375 g, Intravenous, Q8H, Kris Mouton, [facility], Last Rate: 12.5 mL/hr at 06/01/20 0344, 3.375 g at 06/01/20 0344 .  polyethylene glycol (MIRALAX / GLYCOLAX) packet 17 g, 17 g, Per Tube, Daily PRN, Gonfa, Taye T, MD .  senna-docusate (Senokot-S) tablet 1 tablet, 1 tablet, Per Tube, BID PRN, Cyndia Skeeters, Taye T, MD .  thiamine tablet 100 mg, 100 mg, Per Tube, Daily, Bailey-Modzik, Delila A, NP, 100 mg at 06/01/20 3570   ASSESSMENT/PLAN [name] is a 70 y.o. male with history of HTN, DM, CAD/MI, afib on eliquis presenting with AMS, garbled speech, N/V and behavior change.   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 Including filling both the third ventricle with layering blood in the fourth and lateral ventricle  Repeat CT head 05/21/2020-done for concern for rightward gaze-with no new changes. CTA head and neck with no LVO. Thalamic ICH is resolving and blood seen layered in the posterior horns of lateral ventricles.  2D Echo EF 45-50%, No shunt  LDL 84  HgbA1c 9.4  VTE prophylaxis - SCDs, Lovenox 45m daily  Eliquis (apixaban) daily prior to admission, now on No  antithrombotic  Therapy recommendations:  SNF  Disposition:  SNF pending medical readiness  Son has been difficult to reach (?truckdriver) despite several attempts. He did come in this morning for discussion as above. Our discussion indicated he will need ongoing support for low health literacy, realistic goal setting and education regarding his father's illness. He wishes to proceed with full treatment.  Discussed with Dr. GCyndia Skeeterswho agrees to palliative care  consult. I called the consult today. They may not be able to address until tomorrow.  Son was asked to provide additional contact information so he can be reached more readily.   Palliative care consult is appreciated but family wants full support   his facility staff reported his baseline functioning was  very slow to speak, able to independently transfer to  chair.   IVH  S/p EVD  2/27 Neurosurgery instilled tPA into EVD  3/1 EVD weaned to 20 cm H20, ICPs stable  3/2 failed clamping trial  3/5 EVD accidentally dislodged. Reintubated.   CT head repeat 3/6 stable, repeat at night due to concern for gaze deviation also stable.   3/7NSG signed off.   Extubated 3/8. EEG showed no seizure  Hypertensive emergency  Home meds:  Lotensin, metoprolol  Stable on the low end  On Benazepril 37m daily, metoprolol 581mbid, cardizem 60 Q6 . Long-term BP goal normotensive  [facility]   On eliquis PTA  Reversed by  Kcentra  No antithrombotics/anticoagulants due to ICRowlandContinue po cardizem and metoprolol for rate control  Cardiology consult appreciated: Rate control adequate.   Continue tele monitoring   EF 45%   Concern for seizure 3 nights ago. No recurrence   EEG with no evidence of seizures.  Severe diffuse encephalopathy of nonspecific etiology.  Continue Keppra 750 twice daily   HYPERNATREMIA -Likely multifactorial including medicines like Zosyn.  Zosyn discontinued 3/8. Free water was added.  Trending BMP. -Remains hyponatremic with sodium 148->151->151->151-->151 -Continue free water-appreciate CCM management.  ABDOMINAL TENDERNESS -Mild transaminitis.  Abdominal x-ray unremarkable. -If has more tenderness, will consider an ultrasound.  Appreciate CCM assistance.  Respiratory failure and LLL Pneumonia/sinusitis Fever - resolved  Extubated 3/1, vomited post extubation  Tachypnea re-intubated 3/5 and extubated 3/8  CCM on board and appreciated  Empiric antibiotics-completed 5 days of Zosyn.   WBC 8.2  (stable in last 4 days)  Tmax 100.2  Blood cultures 3/4 pending-no growth in 4 days  Appreciate CCM assistance with management  Dysphagia  Likely due to hemorrhagic stroke  Failed swallow eval  On TF @ 5571Speech on board  Waxing and waning mentation has been prohibitive for       safe swallowing  High aspiration risk   Attempting to reach family has been unsuccessful. Left  voicemail with son today.   SLP unable to assess today due to somnolence.  Hyperlipidemia  Home meds:  crestor 20  LDL 84, goal < 70  May continue statin at discharge  Diabetes type II Uncontrolled with hyperglycemia and episodes of hpoglycemia.  Home meds:  insulin  HgbA1c 9.4, goal < 7.0  SSI  Appreciate CCM assistance in management  Other Stroke Risk Factors  Advanced Age >/= 6576 Hx stroke/TIA    Palliative care team is spoken to the son but no decision has been  made.  And PEG tube i which has now been postponed till Friday.  Discussed with Dr. PrDarryll Capers Continue ongoing medical care.  Transfer to nursing home  after PEG tube when medically stable. PrAntony ContrasMD Medical Director [facility] Pager: [phone] 5727/17/2022 1:46 PM

## 2020-06-01 NOTE — Progress Notes (Signed)
  Speech Language Pathology Treatment: Dysphagia  Patient Details Name: Jason [name] MRN: [id number] DOB: May 19, 1950 Today's Date: 06/01/2020 Time: 1200-1229 SLP Time Calculation (min) (ACUTE ONLY): 29 min  Assessment / Plan / Recommendation Clinical Impression  Continued PO trials, initial planned FEES 3/16 canceled due to pt onset of fever. Pt alert this date, requesting water. Performed diligent oral care as some lingual coating and dried secretions noted (not as bad as prior interaction with SLP). Pt with good oral acceptance of PO and adequate mastication. Swallow initiation appeared weakened and delayed per palpation. Delayed throat clear exhibited in 1/4 trials. Recommend FEES to further assess swallow safety and efficiency. FEES planned 3/18 pending pt alertness and medical stability. Pt okay for single ice chip following diligent oral care as tolerated.     HPI HPI: 70yo male admitted 05/11/20 with AMS. PMH: HTN, DM, CVA, CAD, MI, CABG, AFib, anxiety/depression, recent fall (02/2020) with DKA/HHS, memory impairment.  CTHead - hemorrhage with intraventricular extension and possibly mildly increased ventricular size. Intubated 2/25 - 3/1.Evaluated 3/2, on 3/4 recommended full liquids, pt passed [place], worsening mental status, reintubated 3/5-3/8.      SLP Plan  Continue with current plan of care;Other (Comment) (FEES 3/18)       Recommendations  Diet recommendations: NPO Medication Administration: Via alternative means                Oral Care Recommendations: Oral care QID;Oral care prior to ice chip/H20 Follow up Recommendations: Skilled Nursing facility SLP Visit Diagnosis: Dysphagia, oral phase (R13.11);Dysphagia, unspecified (R13.10) Plan: Continue with current plan of care;Other (Comment) (FEES 3/18)       GO                Ardyth Gal MA, CCC-SLP Acute Rehabilitation Services   06/01/2020, 12:52 PM

## 2020-06-01 NOTE — Progress Notes (Signed)
Physical Therapy Treatment Patient Details Name: Jason [name] MRN: [id number] DOB: Aug 07, 1950 Today's Date: 06/01/2020    History of Present Illness Pt is a 70 y.o. male admitted from [place] [facility] on 05/11/20 with AMS. 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 S/p R frontal IVC placement. ETT 2/25-3/1; EVD accidentally disloged 3/5 and pt reintubated; extubated again 3/8. Concern for new rightward gaze 3/6 with repeat head CT showing no new changes; thalamic ICH resolving. EEG 3/8 showed no seizures. Pt with dysphagia; plan for PEG placement 3/15. PMH includes DM, HTN, CVA, CABG, afib with RVR, anxiety, depression; of note, recent admission from home 02/2020 with DKA, d/c to SNF for rehab.    PT Comments    Patient remains lethargic and follows <25% simple commands. Patient with R gaze preference but able to come to and cross midline with verbal cueing. Patient requires totalA+2 for supine<>sit with no initiation from patient. Performed rolling with maxA+2 to reposition pads. Patient continues to be limited by lethargy, impaired cognition, generalized weakness, and impaired sitting balance. Continue to recommend SNF for ongoing Physical Therapy.       Follow Up Recommendations  SNF;Supervision for mobility/OOB     Equipment Recommendations  Hospital bed;Wheelchair (measurements PT);Wheelchair cushion (measurements PT);Other (comment) (hoyer lift)    Recommendations for Other Services       Precautions / Restrictions Precautions Precautions: Fall;Other (comment) Precaution Comments: Bladder/bowel incontinence; sacral wound w/ dressing; Cortrak Restrictions Weight Bearing Restrictions: No    Mobility  Bed Mobility Overal bed mobility: Needs Assistance Bed Mobility: Supine to Sit;Sit to Supine Rolling: Max assist;+2 for physical assistance;+2 for safety/equipment   Supine to sit: Total assist;+2 for physical assistance;+2 for safety/equipment Sit  to supine: Total assist;+2 for physical assistance;+2 for safety/equipment   General bed mobility comments: totalA+2 for bed mobility with no initiation from patient. MaxA+2 for rolling for pad readjusment    Transfers                 General transfer comment: Will require maximove lift for transfer to recliner  Ambulation/Gait                 Stairs             Wheelchair Mobility    Modified Rankin (Stroke Patients Only) Modified Rankin (Stroke Patients Only) Pre-Morbid Rankin Score: Moderate disability Modified Rankin: Severe disability     Balance Overall balance assessment: Needs assistance Sitting-balance support: Bilateral upper extremity supported;Feet supported Sitting balance-Leahy Scale: Zero Sitting balance - Comments: requires maxA-totalA to maintain static sitting balance. Patient with one instance of minA for <3 seconds. Able to self correct anteriorly one instance with multimodal cueing Postural control: Posterior lean;Right lateral lean                                  Cognition Arousal/Alertness: Lethargic Behavior During Therapy: Flat affect Overall Cognitive Status: Difficult to assess Area of Impairment: Attention;Following commands;Awareness;Problem solving                   Current Attention Level: Focused   Following Commands: Follows one step commands inconsistently   Awareness: Intellectual Problem Solving: Decreased initiation;Slow processing;Difficulty sequencing;Requires verbal cues;Requires tactile cues General Comments: Patient with minimal verbalizations this session. Patient following <25% of simple commands. R side gaze preference but able to cross midline wtih verbal cueing      Exercises  General Comments General comments (skin integrity, edema, etc.): B prevalon boots reapplied at end of session. VSS on 2L O2 [place]      Pertinent Vitals/Pain Pain Assessment: Faces Pain Score: 0-No  pain Faces Pain Scale: Hurts little more Pain Location: "everywhere" with movement Pain Descriptors / Indicators: Grimacing;Moaning;Guarding Pain Intervention(s): Monitored during session;Repositioned    Home Living                      Prior Function            PT Goals (current goals can now be found in the care plan section) Acute Rehab PT Goals Patient Stated Goal: none stated; per chart, pt's son wants to continue with full scope of care PT Goal Formulation: Patient unable to participate in goal setting Time For Goal Achievement: 06/12/20 Potential to Achieve Goals: Poor Progress towards PT goals: Progressing toward goals    Frequency    Min 2X/week      PT Plan Current plan remains appropriate    Co-evaluation              AM-PAC PT "6 Clicks" Mobility   Outcome Measure  Help needed turning from your back to your side while in a flat bed without using bedrails?: A Lot Help needed moving from lying on your back to sitting on the side of a flat bed without using bedrails?: Total Help needed moving to and from a bed to a chair (including a wheelchair)?: Total Help needed standing up from a chair using your arms (e.g., wheelchair or bedside chair)?: Total Help needed to walk in hospital room?: Total Help needed climbing 3-5 steps with a railing? : Total 6 Click Score: 7    End of Session Equipment Utilized During Treatment: Oxygen Activity Tolerance: Patient limited by lethargy Patient left: in bed;with call bell/phone within reach;with SCD's reapplied Nurse Communication: Mobility status;Need for lift equipment PT Visit Diagnosis: Muscle weakness (generalized) (M62.81);Other abnormalities of gait and mobility (R26.89);Other symptoms and signs involving the nervous system (R29.898)     Time: 1300-1330 PT Time Calculation (min) (ACUTE ONLY): 30 min  Charges:  $Therapeutic Activity: 23-37 mins                     Saraiya Kozma A. Dan Humphreys PT,  DPT Acute Rehabilitation Services Pager [phone] Office [phone]    Viviann Spare 06/01/2020, 3:13 PM

## 2020-06-01 NOTE — Progress Notes (Signed)
Addendum: Discussed poor prognosis with son again, agrees to DNR and that PEG tube may not be appropriate in this setting. Continue Palliative care discussions  Zannie Cove, MD

## 2020-06-01 NOTE — Progress Notes (Addendum)
Daily Progress Note   Patient Name: Jason [name]       Date: 06/01/2020 DOB: October 17, 1950  Age: 70 y.o. MRN#: [id number] Attending Physician: Zannie Cove, MD Primary Care Physician: Sherron Monday, MD Admit Date: 05/11/2020  Reason for Consultation/Follow-up: To discuss complex medical decision making related to patient's goals of care  Patient developed sepsis 3/15-16 with fever 102.  Likely aspiration. On my exam - patient with 1 eye open - did make eye contact, shallow quick breaths, moves arms non-purposefully.  Cor trak in place. Patient has RLQ abdominal pain on palpation.  There seems to be a soft mass there as well.  Nothing noted in RLQ on 12/21 CT abdomen.  Subjective: I spoke with son Jason [name] on the telephone.  He is a Naval architect and is driving thru [place].  I told Jason [name] about his father's sepsis and attempted to explain aspiration.  Jason [name] responded "so you're telling me my Dad's not going to get better?"   I encouraged him to come to the hospital to see his father and to bring his brother and sister with him.  Jason [name] replied that his brother and sister don't want to come see their father.    I told Jason [name] that the doctors believe he is near his new functional baseline.  He won't be able to walk or get out of bed.  He won't be able to eat or speak.  Jason [name] asked "How can I just give up on him?"  I attempted to explain that it's not about giving up - its about reading the situation and making the best possible decision for your father.  I explained that there are things worse than death - suffering with recurrent aspiration pneumonia, being bed bound and developing bed sores and dying very slowly.     We talked for a while longer.  Jason [name] sounded so down.  He eventually told me "I'll have  to call you back later."   Assessment: Patient stabilizing from aspiration. I'm concerned for recurrent aspiration pneumonia.  He likely can not protect his airway well.   Patient Profile/HPI:    70 y.o. male  with past medical history of DM, previous CVA, CAD/MI/CABG/Afib on anticoagulation, anxiety and depression who was admitted on 05/11/2020 with altered mental status and hypertensive emergency.  Imaging revealed hemorrhage of  the left thalamus with intro ventricular extension.   He was intubated and extubated 2x and has remained encephalopathic.  He is currently in a floor bed.  His albumin is 2.1.  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  Length of Stay: 21   Vital Signs: BP 111/80 (BP Location: Left Arm)   Pulse (!) 102   Temp 99.2 F (37.3 C) (Axillary)   Resp (!) 37   Ht 5\' 10"  (1.778 m)   Wt 107 kg   SpO2 100%   BMI 33.86 kg/m  SpO2: SpO2: 100 % O2 Device: O2 Device: Nasal Cannula O2 Flow Rate: O2 Flow Rate (L/min): 2 L/min       Palliative Assessment/Data: 10%     Palliative Care Plan    Recommendations/Plan: PMT will continue to follow with you. Given recurrent aspiration risk and poor prognosis for functional recovery his code status is of great importance. Son needs further support and education.  My colleague , NP will care for him tomorrow.  Code Status:  Full code  Prognosis: Even with full support trach / PEG, I feel he likely only has weeks to months.  Discharge Planning: To Be Determined  Care plan was discussed with son.     Thank you for allowing the Palliative Medicine Team to assist in the care of this patient.  Total time spent:  35 min.     Greater than 50%  of this time was spent counseling and coordinating care related to the above assessment and plan.  Ocie Bob, PA-C Palliative Medicine  Please contact  Palliative MedicineTeam phone at [phone] for questions and concerns between 7 am - 7 pm.   Please see AMION for individual provider pager numbers.

## 2020-06-02 ENCOUNTER — Inpatient Hospital Stay (HOSPITAL_COMMUNITY): Payer: Medicare HMO

## 2020-06-02 DIAGNOSIS — I161 Hypertensive emergency: Secondary | ICD-10-CM | POA: Diagnosis not present

## 2020-06-02 DIAGNOSIS — L8992 Pressure ulcer of unspecified site, stage 2: Secondary | ICD-10-CM | POA: Diagnosis not present

## 2020-06-02 DIAGNOSIS — R400 Somnolence: Secondary | ICD-10-CM | POA: Diagnosis not present

## 2020-06-02 DIAGNOSIS — I615 Nontraumatic intracerebral hemorrhage, intraventricular: Secondary | ICD-10-CM | POA: Diagnosis not present

## 2020-06-02 LAB — BASIC METABOLIC PANEL
Anion gap: 9 (ref 5–15)
BUN: 55 mg/dL — ABNORMAL HIGH (ref 8–23)
CO2: 24 mmol/L (ref 22–32)
Calcium: 7.5 mg/dL — ABNORMAL LOW (ref 8.9–10.3)
Chloride: 113 mmol/L — ABNORMAL HIGH (ref 98–111)
Creatinine, Ser: 2.16 mg/dL — ABNORMAL HIGH (ref 0.61–1.24)
GFR, Estimated: 32 mL/min — ABNORMAL LOW (ref >60)
Glucose, Bld: 216 mg/dL — ABNORMAL HIGH (ref 70–99)
Potassium: 3.8 mmol/L (ref 3.5–5.1)
Sodium: 146 mmol/L — ABNORMAL HIGH (ref 135–145)

## 2020-06-02 LAB — CBC
HCT: 39.9 % (ref 39.0–52.0)
Hemoglobin: 12.5 g/dL — ABNORMAL LOW (ref 13.0–17.0)
MCH: 27.9 pg (ref 26.0–34.0)
MCHC: 31.3 g/dL (ref 30.0–36.0)
MCV: 89.1 fL (ref 80.0–100.0)
Platelets: 87 10*3/uL — ABNORMAL LOW (ref 150–400)
RBC: 4.48 MIL/uL (ref 4.22–5.81)
RDW: 15.2 % (ref 11.5–15.5)
WBC: 4.9 10*3/uL (ref 4.0–10.5)
nRBC: 0 % (ref 0.0–0.2)

## 2020-06-02 LAB — GLUCOSE, CAPILLARY
Glucose-Capillary: 165 mg/dL — ABNORMAL HIGH (ref 70–99)
Glucose-Capillary: 193 mg/dL — ABNORMAL HIGH (ref 70–99)
Glucose-Capillary: 206 mg/dL — ABNORMAL HIGH (ref 70–99)
Glucose-Capillary: 213 mg/dL — ABNORMAL HIGH (ref 70–99)
Glucose-Capillary: 220 mg/dL — ABNORMAL HIGH (ref 70–99)
Glucose-Capillary: 231 mg/dL — ABNORMAL HIGH (ref 70–99)

## 2020-06-02 LAB — PROCALCITONIN: Procalcitonin: 2.52 ng/mL

## 2020-06-02 MED ORDER — GADOBUTROL 1 MMOL/ML IV SOLN
7.0000 mL | Freq: Once | INTRAVENOUS | Status: AC | PRN
  Administered 2020-06-02: 7.5 mL via INTRAVENOUS

## 2020-06-02 MED ORDER — INSULIN GLARGINE 100 UNIT/ML ~~LOC~~ SOLN
10.0000 [IU] | Freq: Every day | SUBCUTANEOUS | Status: DC
  Administered 2020-06-02 – 2020-06-05 (×4): 10 [IU] via SUBCUTANEOUS
  Filled 2020-06-02 (×4): qty 0.1

## 2020-06-02 MED ORDER — SODIUM CHLORIDE 0.9 % IV SOLN
200.0000 mg | Freq: Once | INTRAVENOUS | Status: AC
  Administered 2020-06-02: 200 mg via INTRAVENOUS
  Filled 2020-06-02: qty 200

## 2020-06-02 MED ORDER — RESOURCE THICKENUP CLEAR PO POWD
ORAL | Status: DC | PRN
  Filled 2020-06-02: qty 125

## 2020-06-02 MED ORDER — FREE WATER
200.0000 mL | Status: DC
  Administered 2020-06-02 – 2020-06-06 (×20): 200 mL

## 2020-06-02 MED ORDER — FLUCONAZOLE IN SODIUM CHLORIDE 400-0.9 MG/200ML-% IV SOLN
400.0000 mg | INTRAVENOUS | Status: DC
  Filled 2020-06-02: qty 200

## 2020-06-02 MED ORDER — SODIUM CHLORIDE 0.9 % IV SOLN
100.0000 mg | INTRAVENOUS | Status: DC
  Administered 2020-06-03 – 2020-06-05 (×3): 100 mg via INTRAVENOUS
  Filled 2020-06-02 (×4): qty 100

## 2020-06-02 NOTE — Progress Notes (Signed)
Inpatient Diabetes Program Recommendations  AACE/ADA: New Consensus Statement on Inpatient Glycemic Control (2015)  Target Ranges:  Prepandial:   less than 140 mg/dL      Peak postprandial:   less than 180 mg/dL (1-2 hours)      Critically ill patients:  140 - 180 mg/dL   Results for Jason [name], Jason [name] (MRN [id number]) as of 06/02/2020 10:18  Ref. Range 06/01/2020 23:45 06/02/2020 04:39 06/02/2020 08:00  Glucose-Capillary Latest Ref Range: 70 - 99 mg/dL 446 (H)  7 units NOVOLOG  193 (H)  4 units NOVOLOG  206 (H)  7 units NOVOLOG     Home DM Meds: Basaglar 20 units Daily       Humalog 2-12 units TID with meals  Current Orders: Novolog Resistant Correction Scale/ SSI (0-20 units) Q4 hours     MD- Note Tube Feeds running 55cc/hr.  CBGs slightly elevated since 12am today.  Please consider adding Novolog tube feed coverage:  Novolog 3 units Q4 hours  HOLD if tube feeds HELD for any reason     --Will follow patient during hospitalization--  Ambrose Finland RN, MSN, CDE Diabetes Coordinator Inpatient Glycemic Control Team Team Pager: [phone] (8a-5p)

## 2020-06-02 NOTE — Progress Notes (Signed)
PROGRESS NOTE    Gauge Winski   LKG:[id number]  DOB: 20-Dec-1950  PCP: Jodi Marble, MD    DOA: 05/11/2020 LOS: 80   Brief Narrative   70 year old M with PMH of CAD, DM-2, PAF on Eliquis, CVA, HTN and systolic CHF admitted to ICU on 05/11/2020 with >1.2cm thalamic hemorrhage with intraventricular extension in the setting of hypertensive emergency.  He received Kcentra for Eliquis reversal.  He was intubated and had external ventricular drain placed on 05/12/2020.  He was extubated on 05/16/2020.  EVD dislodged on 05/20/2020.  He was reintubated on 3/5, and eventually extubated to BiPAP on 3/8.  He was transferred to [facility] service on 3/12, Clinical course since then, patient continues to be persistently encephalopathic, in semi-vegetative state.  Palliative care consulted. 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 Fib -Received KCentra for Eliquis reversal in ED -Followed by neurosurgery and neurology, external ventricular drain from 2/25-3/5.  -EEG with generalized slowing consistent with moderate to severe diffuse encephalopathy -Continued on Keppra for seizure prophylaxis -Continue PEG tube feeds for now, with some improvement in mentation plan to reattempt p.o. dysphagia diet for now, hold off on PEG tube and tube feeds  Candida glabrata fungemia -Febrile 3/16+ for Candida glabrata -Appreciate infectious disease input now on Eraxis and fluconazole, discussed with neurology highly unlikely to have fungal meningitis -Follow-up MRI  brain -Will discontinue Zosyn -If patient continues to make clinical progress will ask ophthalmology to evaluate on Monday to exclude fungal endophthalmitis -Follow-up repeat blood cultures from today  Acute respiratory failure with hypoxia  -Secondary to hydrocephalus and intracranial hemorrhage, - intubation and mechanical ventilation from 2/25-3/1, reintubated 3/5-3/8 --More stable now, monitor  Persistent A. fib/A flutter: - Rate has been controlled mostly in 60's. Was on Eliquis POA. -Eliquis discontinued in the setting of intracranial hemorrhage -Blood pressure stabilized, continue Cardizem and metoprolol  Chronic systolic CHF:  - LVEF 45 to 50%.  - no overt fluid overload. -Monitor off diuretics at this time  Hypernatremia:  -Improving, continue free water flushes  Hypertensive emergency/uncontrolled hypertension:  BP 205/120 on admit.   Hypotensive yesterday, now normotensive, restarted Cardizem and metoprolol  History of CAD: Stable -Cardiac meds as above.  Uncontrolled IDDM-2: A1c 9.4% -Blood sugars trending up again, restart Lantus, continue sliding scale   Pressure skin injury: Stage II Pressure Injury 05/24/20 Buttocks Bilateral;Medial Deep Tissue Pressure Injury - Purple or maroon localized area of discolored intact skin or blood-filled blister due to damage of underlying soft tissue from pressure and/or shear. Multiple blisters along (Active)  05/24/20 0800  Location: Buttocks  Location Orientation: Bilateral;Medial  Staging: Deep Tissue Pressure Injury - Purple or maroon localized area of discolored intact skin or blood-filled blister due to damage of underlying soft tissue from pressure and/or shear.  Wound Description (Comments): Multiple blisters along with deep tissue injury.  Present on Admission:      Obesity: Body mass index is 33 kg/m.  Complicates overall care and prognosis.  DVT prophylaxis: Lovenox   CODE STATUS: full  code  Subjective 06/02/20    -Less obtunded  today, opens eyes, does not follow any commands   Disposition Plan & Communication   Status is: Inpatient  Inpatient status is appropriate due to severity of illness  Dispo: The patient is from: Home              Anticipated d/c is to: SNF              Patient currently is not medically stable for d/c   Difficult to place patient - No   Consults, Procedures, Significant Events   Consultants:  Neurosurgery Intensivist Cardiology Neurology Palliative medicine  Procedures:  ETT 2/25 >> 3/1, 3/5 >> 3/8 EVD 2/25 >> 3/5  Antimicrobials:  Anti-infectives (From admission, onward)   Start     Dose/Rate Route Frequency Ordered Stop   06/03/20 1300  anidulafungin (ERAXIS) 100 mg in sodium chloride 0.9 % 100 mL IVPB       "Followed by" Linked Group Details   100 mg 78 mL/hr over 100 Minutes Intravenous Every 24 hours 06/02/20 1119     06/02/20 1600  fluconazole (DIFLUCAN) IVPB 400 mg        400 mg 100 mL/hr over 120 Minutes Intravenous Every 24 hours 06/02/20 1119     06/02/20 1300  anidulafungin (ERAXIS) 200 mg in sodium chloride 0.9 % 200 mL IVPB       "Followed by" Linked Group Details   200 mg 78 mL/hr over 200 Minutes Intravenous  Once 06/02/20 1119     06/02/20 1200  anidulafungin (ERAXIS) 100 mg in sodium chloride 0.9 % 100 mL IVPB  Status:  Discontinued        100 mg 78 mL/hr over 100 Minutes Intravenous Every 24 hours 06/01/20 1230 06/01/20 1249   06/02/20 1000  voriconazole (VFEND) tablet 200 mg  Status:  Discontinued        200 mg Oral Every 12 hours 06/01/20 1347 06/01/20 1436   06/02/20 1000  voriconazole (VFEND) tablet 200 mg  Status:  Discontinued        200 mg Per Tube Every 12 hours 06/01/20 1436 06/02/20 1119   06/01/20 1445  voriconazole (VFEND) tablet 400 mg        400 mg Per Tube Every 12 hours 06/01/20 1436 06/01/20 2145   06/01/20 1430  voriconazole (VFEND) tablet 400 mg  Status:  Discontinued        400 mg  Oral Every 12 hours 06/01/20 1347 06/01/20 1436   06/01/20 1330  anidulafungin (ERAXIS) 200 mg in sodium chloride 0.9 % 200 mL IVPB  Status:  Discontinued        200 mg 78 mL/hr over 200 Minutes Intravenous  Once 06/01/20 1230 06/01/20 1249   06/01/20 1300  vancomycin (VANCOREADY) IVPB 1000 mg/200 mL  Status:  Discontinued        1,000 mg 200 mL/hr over 60 Minutes Intravenous Every 24 hours 05/31/20 1323 06/01/20 0928   05/31/20 1900  piperacillin-tazobactam (ZOSYN) IVPB 3.375 g  Status:  Discontinued        3.375 g 12.5 mL/hr over 240 Minutes Intravenous Every 8 hours 05/31/20 1012 06/02/20 1346   05/31/20 1100  vancomycin (VANCOREADY) IVPB 2000 mg/400 mL        2,000 mg 200 mL/hr over 120 Minutes Intravenous  Once 05/31/20 1012 05/31/20 1634   05/31/20 1100  piperacillin-tazobactam (ZOSYN) IVPB 3.375 g        3.375 g 100 mL/hr over 30 Minutes Intravenous  Once 05/31/20 1012 05/31/20 1406   05/21/20  1100  vancomycin (VANCOREADY) IVPB 1750 mg/350 mL  Status:  Discontinued        1,750 mg 175 mL/hr over 120 Minutes Intravenous Every 24 hours 05/20/20 0953 05/23/20 1046   05/20/20 1045  vancomycin (VANCOREADY) IVPB 2000 mg/400 mL        2,000 mg 200 mL/hr over 120 Minutes Intravenous  Once 05/20/20 0948 05/20/20 1341   05/19/20 1015  cefTRIAXone (ROCEPHIN) 2 g in sodium chloride 0.9 % 100 mL IVPB  Status:  Discontinued        2 g 200 mL/hr over 30 Minutes Intravenous Every 24 hours 05/18/20 0958 05/18/20 1132   05/18/20 1400  piperacillin-tazobactam (ZOSYN) IVPB 3.375 g  Status:  Discontinued        3.375 g 12.5 mL/hr over 240 Minutes Intravenous Every 8 hours 05/18/20 1132 05/23/20 1046   05/17/20 1015  cefTRIAXone (ROCEPHIN) 1 g in sodium chloride 0.9 % 100 mL IVPB  Status:  Discontinued        1 g 200 mL/hr over 30 Minutes Intravenous Every 24 hours 05/17/20 0918 05/18/20 0958   05/12/20 1530  ceFAZolin (ANCEF) IVPB 1 g/50 mL premix  Status:  Discontinued        1 g 100 mL/hr over  30 Minutes Intravenous Every 8 hours 05/12/20 1440 05/17/20 0918        Micro    Objective   Vitals:   06/02/20 0500 06/02/20 0606 06/02/20 0803 06/02/20 1139  BP:  (!) 141/90 130/84 134/81  Pulse:   70 87  Resp:   (!) 38 (!) 40  Temp:   97.7 F (36.5 C) 98.1 F (36.7 C)  TempSrc:   Oral Axillary  SpO2:   99% 99%  Weight: 104.3 kg     Height:        Intake/Output Summary (Last 24 hours) at 06/02/2020 1350 Last data filed at 06/02/2020 1152 Gross per 24 hour  Intake --  Output 1075 ml  Net -1075 ml   Filed Weights   05/31/20 0417 06/01/20 0404 06/02/20 0500  Weight: 99.2 kg 107 kg 104.3 kg    Physical Exam: Chronically ill elderly male laying in bed, more interactive opens eyes and tracking today intermittently follows some commands  HEENT: Pupils are equal and reactive CVS: S1-S2, regular rate rhythm, tachycardic Lungs: Few scattered rhonchi Abdomen: Soft, nontender, bowel sounds present Extremities: No edema, offloading boots on Neuro: Minimally responsive, opens eyes, does not follow commands, pupils are equal and reactive   Labs   Data Reviewed: I have personally reviewed following labs and imaging studies  CBC: Recent Labs  Lab 05/28/20 0404 05/31/20 0331 06/01/20 0435 06/02/20 0404  WBC 10.6* 9.9 7.3 4.9  HGB 14.7 14.0 14.0 12.5*  HCT 47.7 44.8 44.0 39.9  MCV 90.7 90.0 88.9 89.1  PLT 263 219 108* 87*   Basic Metabolic Panel: Recent Labs  Lab 05/27/20 0407 05/28/20 0404 05/31/20 0331 06/01/20 0435 06/02/20 0404  NA 153* 150* 149* 144 146*  K 3.8 4.0 3.8 4.2 3.8  CL 118* 114* 112* 112* 113*  CO2 $Re'28 26 28 22 24  'afY$ GLUCOSE 97 62* 78 128* 216*  BUN 26* 27* 42* 51* 55*  CREATININE 0.80 0.88 1.49* 2.06* 2.16*  CALCIUM 8.4* 8.3* 8.1* 7.5* 7.5*  MG  --  2.6* 2.8*  --   --   PHOS  --  4.9*  --   --   --    GFR: Estimated Creatinine Clearance: 37.9 mL/min (A) (  by C-G formula based on SCr of 2.16 mg/dL (H)). Liver Function Tests: Recent Labs   Lab 05/28/20 0404 05/31/20 0331  AST 42* 35  ALT 67* 46*  ALKPHOS 68 66  BILITOT 0.5 0.6  PROT 6.7 6.4*  ALBUMIN 2.1* 2.0*   No results for input(s): LIPASE, AMYLASE in the last 168 hours. No results for input(s): AMMONIA in the last 168 hours. Coagulation Profile: No results for input(s): INR, PROTIME in the last 168 hours. Cardiac Enzymes: No results for input(s): CKTOTAL, CKMB, CKMBINDEX, TROPONINI in the last 168 hours. BNP (last 3 results) No results for input(s): PROBNP in the last 8760 hours. HbA1C: No results for input(s): HGBA1C in the last 72 hours. CBG: Recent Labs  Lab 06/01/20 2017 06/01/20 2345 06/02/20 0439 06/02/20 0800 06/02/20 1149  GLUCAP 183* 207* 193* 206* 231*   Lipid Profile: No results for input(s): CHOL, HDL, LDLCALC, TRIG, CHOLHDL, LDLDIRECT in the last 72 hours. Thyroid Function Tests: No results for input(s): TSH, T4TOTAL, FREET4, T3FREE, THYROIDAB in the last 72 hours. Anemia Panel: No results for input(s): VITAMINB12, FOLATE, FERRITIN, TIBC, IRON, RETICCTPCT in the last 72 hours. Sepsis Labs: Recent Labs  Lab 05/31/20 0933 05/31/20 1227 05/31/20 1534 06/01/20 0435 06/02/20 0404  PROCALCITON  --   --  2.35 3.50 2.52  LATICACIDVEN 2.0* 2.1*  --   --   --     Recent Results (from the past 240 hour(s))  Culture, blood (routine x 2)     Status: Abnormal (Preliminary result)   Collection Time: 05/31/20  9:28 AM   Specimen: BLOOD  Result Value Ref Range Status   Specimen Description BLOOD LEFT ANTECUBITAL  Final   Special Requests   Final    BOTTLES DRAWN AEROBIC AND ANAEROBIC Blood Culture results may not be optimal due to an inadequate volume of blood received in culture bottles   Culture  Setup Time (A)  Final    YEAST ANAEROBIC BOTTLE ONLY CRITICAL RESULT CALLED TO, READ BACK BY AND VERIFIED WITH: [place] J. 1326 [id number] FCP    Culture (A)  Final    CANDIDA GLABRATA CULTURE REINCUBATED FOR BETTER GROWTH Performed at  [facility], [place] [address], [place], [place] [address]    Report Status PENDING  Incomplete  Blood Culture ID Panel (Reflexed)     Status: Abnormal   Collection Time: 05/31/20  9:28 AM  Result Value Ref Range Status   Enterococcus faecalis NOT DETECTED NOT DETECTED Final   Enterococcus Faecium NOT DETECTED NOT DETECTED Final   Listeria monocytogenes NOT DETECTED NOT DETECTED Final   Staphylococcus species NOT DETECTED NOT DETECTED Final   Staphylococcus aureus (BCID) NOT DETECTED NOT DETECTED Final   Staphylococcus epidermidis NOT DETECTED NOT DETECTED Final   Staphylococcus lugdunensis NOT DETECTED NOT DETECTED Final   Streptococcus species NOT DETECTED NOT DETECTED Final   Streptococcus agalactiae NOT DETECTED NOT DETECTED Final   Streptococcus pneumoniae NOT DETECTED NOT DETECTED Final   Streptococcus pyogenes NOT DETECTED NOT DETECTED Final   A.calcoaceticus-baumannii NOT DETECTED NOT DETECTED Final   Bacteroides fragilis NOT DETECTED NOT DETECTED Final   Enterobacterales NOT DETECTED NOT DETECTED Final   Enterobacter cloacae complex NOT DETECTED NOT DETECTED Final   Escherichia coli NOT DETECTED NOT DETECTED Final   Klebsiella aerogenes NOT DETECTED NOT DETECTED Final   Klebsiella oxytoca NOT DETECTED NOT DETECTED Final   Klebsiella pneumoniae NOT DETECTED NOT DETECTED Final   Proteus species NOT DETECTED NOT DETECTED Final  Salmonella species NOT DETECTED NOT DETECTED Final   Serratia marcescens NOT DETECTED NOT DETECTED Final   Haemophilus influenzae NOT DETECTED NOT DETECTED Final   Neisseria meningitidis NOT DETECTED NOT DETECTED Final   Pseudomonas aeruginosa NOT DETECTED NOT DETECTED Final   Stenotrophomonas maltophilia NOT DETECTED NOT DETECTED Final   Candida albicans NOT DETECTED NOT DETECTED Final   Candida auris NOT DETECTED NOT DETECTED Final   Candida glabrata DETECTED (A) NOT DETECTED Final    Comment: CRITICAL RESULT CALLED TO, READ BACK BY AND VERIFIED  WITH: PHARMD THOMAS J. 1326 [id number] FCP    Candida krusei NOT DETECTED NOT DETECTED Final   Candida parapsilosis NOT DETECTED NOT DETECTED Final   Candida tropicalis NOT DETECTED NOT DETECTED Final   Cryptococcus neoformans/gattii NOT DETECTED NOT DETECTED Final    Comment: Performed at [facility], [place] [address], [place], [place] [address]  Culture, blood (routine x 2)     Status: None (Preliminary result)   Collection Time: 05/31/20  9:36 AM   Specimen: BLOOD LEFT HAND  Result Value Ref Range Status   Specimen Description BLOOD LEFT HAND  Final   Special Requests   Final    BOTTLES DRAWN AEROBIC AND ANAEROBIC Blood Culture results may not be optimal due to an inadequate volume of blood received in culture bottles   Culture  Setup Time   Final    YEAST ANAEROBIC BOTTLE ONLY CRITICAL VALUE NOTED.  VALUE IS CONSISTENT WITH PREVIOUSLY REPORTED AND CALLED VALUE.    Culture   Final    YEAST IDENTIFICATION TO FOLLOW Performed at [facility], [place] [address], [place], [place] [address]    Report Status PENDING  Incomplete  MRSA PCR Screening     Status: None   Collection Time: 05/31/20  3:52 PM   Specimen: Nasal Mucosa; Nasopharyngeal  Result Value Ref Range Status   MRSA by PCR NEGATIVE NEGATIVE Final    Comment:        The GeneXpert MRSA Assay (FDA approved for NASAL specimens only), is one component of a comprehensive MRSA colonization surveillance program. It is not intended to diagnose MRSA infection nor to guide or monitor treatment for MRSA infections. Performed at [facility], [place] [address] [place], [place] [address]       Imaging Studies   No results found.   Medications   Scheduled Meds: . amantadine  100 mg Per Tube BID  . chlorhexidine gluconate (MEDLINE KIT)  15 mL Mouth Rinse BID  . diltiazem  30 mg Per Tube Q8H  . enoxaparin (LOVENOX) injection  40 mg Subcutaneous Daily  . feeding supplement (PROSource TF)  45 mL Per Tube TID   . free water  300 mL Per Tube Q6H  . insulin aspart  0-20 Units Subcutaneous Q4H  . levETIRAcetam  750 mg Per Tube BID  . mouth rinse  15 mL Mouth Rinse q12n4p  . metoprolol tartrate  25 mg Per Tube BID  . pantoprazole sodium  40 mg Per Tube Daily  . thiamine  100 mg Per Tube Daily   Continuous Infusions: . sodium chloride    . sodium chloride 75 mL/hr at 06/01/20 2147  . anidulafungin     Followed by  . [START ON 06/03/2020] anidulafungin    . feeding supplement (JEVITY 1.5 CAL/FIBER) 1,000 mL (06/02/20 0428)  . fluconazole (DIFLUCAN) IV       LOS: 22 days    Time spent: 35 minutes  Domenic Polite, MD [facility]  06/02/2020, 1:50 PM

## 2020-06-02 NOTE — Progress Notes (Signed)
      INFECTIOUS DISEASE ATTENDING ADDENDUM:   Date: 06/02/2020  Patient name: Jason [name]  Medical record number: [id number]  Date of birth: May 27, 1950    MRI without evidence for LM enhancement to suggest meningitis   I wonder if his decubitus might be source of his candida glabrata fungemia.  I am DC'ing his fluconazole and will continue eraxis alone for now  If he does well without obvious CNS symptoms or HA will treat as simple fungemia provided we can exclude fungal endophthalmitis. Dr. Renold Don is covering this weekend.      Acey Lav 06/02/2020, 9:48 PM

## 2020-06-02 NOTE — Procedures (Signed)
Objective Swallowing Evaluation: Type of Study: FEES-Fiberoptic Endoscopic Evaluation of Swallow   Patient Details  Name: Jason [name] MRN: [id number] Date of Birth: 07/26/1950  Today's Date: 06/02/2020 Time: SLP Start Time (ACUTE ONLY): 1026 -SLP Stop Time (ACUTE ONLY): 1100  SLP Time Calculation (min) (ACUTE ONLY): 34 min   Past Medical History:  Past Medical History:  Diagnosis Date  . Anxiety   . Coronary artery disease   . Depression   . Diabetes mellitus without complication (HCC)   . Dysrhythmia   . Hypertension   . Myocardial infarction (HCC)   . Stroke [facility])    Past Surgical History:  Past Surgical History:  Procedure Laterality Date  . CORONARY ARTERY BYPASS GRAFT     HPI: 70yo male admitted 05/11/20 with AMS. PMH: HTN, DM, CVA, CAD, MI, CABG, AFib, anxiety/depression, recent fall (02/2020) with DKA/HHS, memory impairment.  CTHead - hemorrhage with intraventricular extension and possibly mildly increased ventricular size. Intubated 2/25 - 3/1.Evaluated 3/2, on 3/4 recommended full liquids, pt passed [place], worsening mental status, reintubated 3/5-3/8.   Subjective: Pt awake, reclined in bed. Two sons at bedside.     Assessment / Plan / Recommendation  CHL IP CLINICAL IMPRESSIONS 06/02/2020  Clinical Impression Pt presents with severe dry pharyngeal secretions; a mucus plug that almost occluded his upper airway completely. HIs nares were also occluded by dried mucus. This is likely due to prolonged NPO status in setting of lethargy and open mouth breathing with unhumidified oxygen. SLP was able to assist in clearing the plug by offeing ice chips and applying Yankauer to upper pharynx intermittently to loosen and remove the dried material. Pt had intermittent trace aspiration of PO rsidue during this process. Sensation was present, but cough was weak unless SLP induced coughing with slight touch of scope to laryngeal vestibule. After clearing mucus, pt did demonstrate adequate  mobility of hyolaryngeal mechanism with mild weakness and mild pharyngeal residue with honey and puree. Pt is recommended to initaite a Dys 1/honey thick diet with assist from staff and cueing for intermittent coughing and second swallows. It is unclear if he will sustain appropriate arousal for intake. Will follow for tolerance and repeat testing in the near future if he continues to advance.  SLP Visit Diagnosis --  Attention and concentration deficit following --  Frontal lobe and executive function deficit following --  Impact on safety and function --      CHL IP TREATMENT RECOMMENDATION 06/02/2020  Treatment Recommendations Therapy as outlined in treatment plan below     Prognosis 06/02/2020  Prognosis for Safe Diet Advancement Good  Barriers to Reach Goals Cognitive deficits  Barriers/Prognosis Comment --    CHL IP DIET RECOMMENDATION 06/02/2020  SLP Diet Recommendations Dysphagia 1 (Puree) solids;Honey thick liquids  Liquid Administration via Cup;Spoon  Medication Administration --  Compensations --  Postural Changes --      CHL IP OTHER RECOMMENDATIONS 06/02/2020  Recommended Consults --  Oral Care Recommendations Oral care BID  Other Recommendations Have oral suction available      CHL IP FOLLOW UP RECOMMENDATIONS 06/02/2020  Follow up Recommendations Skilled Nursing facility      [facility] IP FREQUENCY AND DURATION 06/02/2020  Speech Therapy Frequency (ACUTE ONLY) min 2x/week  Treatment Duration 2 weeks           CHL IP ORAL PHASE 06/02/2020  Oral Phase (No Data)  Oral - Pudding Teaspoon --  Oral - Pudding Cup --  Oral - Honey Teaspoon --  Oral -  Honey Cup --  Oral - Nectar Teaspoon --  Oral - Nectar Cup --  Oral - Nectar Straw --  Oral - Thin Teaspoon --  Oral - Thin Cup --  Oral - Thin Straw --  Oral - Puree --  Oral - Mech Soft --  Oral - Regular --  Oral - Multi-Consistency --  Oral - Pill --  Oral Phase - Comment --    CHL IP PHARYNGEAL PHASE 06/02/2020   Pharyngeal Phase Impaired  Pharyngeal- Pudding Teaspoon --  Pharyngeal --  Pharyngeal- Pudding Cup --  Pharyngeal --  Pharyngeal- Honey Teaspoon Pharyngeal residue - valleculae;Penetration/Apiration after swallow;Trace aspiration;Pharyngeal residue - pyriform;Lateral channel residue;Reduced epiglottic inversion;Delayed swallow initiation-vallecula  Pharyngeal --  Pharyngeal- Honey Cup --  Pharyngeal --  Pharyngeal- Nectar Teaspoon Pharyngeal residue - valleculae;Penetration/Apiration after swallow;Trace aspiration;Pharyngeal residue - pyriform;Lateral channel residue;Reduced epiglottic inversion;Delayed swallow initiation-vallecula  Pharyngeal --  Pharyngeal- Nectar Cup --  Pharyngeal --  Pharyngeal- Nectar Straw --  Pharyngeal --  Pharyngeal- Thin Teaspoon --  Pharyngeal --  Pharyngeal- Thin Cup --  Pharyngeal --  Pharyngeal- Thin Straw --  Pharyngeal --  Pharyngeal- Puree Pharyngeal residue - valleculae;Penetration/Apiration after swallow;Trace aspiration;Pharyngeal residue - pyriform;Lateral channel residue;Reduced epiglottic inversion;Delayed swallow initiation-vallecula  Pharyngeal --  Pharyngeal- Mechanical Soft --  Pharyngeal --  Pharyngeal- Regular --  Pharyngeal --  Pharyngeal- Multi-consistency --  Pharyngeal --  Pharyngeal- Pill --  Pharyngeal --  Pharyngeal Comment --     No flowsheet data found.  Harlon Ditty, MA CCC-SLP  Acute Rehabilitation Services Pager [phone] Office [phone]  Claudine Mouton 06/02/2020, 11:50 AM

## 2020-06-02 NOTE — Progress Notes (Addendum)
STROKE TEAM PROGRESS NOTE   SUBJECTIVE (INTERVAL HISTORY) Patient remains drowsy but can be easily aroused and follows simple commands.  White count has come down to 4.6    He is growing Candida in one of the blood cultures  .  Still has increased work of breathing.  You went down for swallow exam today and did better than expected..  Palliative care team has spoken to the son who has agreed to DNR would like to continue present treatment. ID team concerned that he may have Candida meningitis or ventriculitis of Jason blood cultures Candida glabrata CBC    Component Value Date/Time   WBC 4.9 06/02/2020 0404   RBC 4.48 06/02/2020 0404   HGB 12.5 (L) 06/02/2020 0404   HCT 39.9 06/02/2020 0404   PLT 87 (L) 06/02/2020 0404   MCV 89.1 06/02/2020 0404   MCH 27.9 06/02/2020 0404   MCHC 31.3 06/02/2020 0404   RDW 15.2 06/02/2020 0404   LYMPHSABS 1.0 05/18/2020 0920   MONOABS 1.3 (H) 05/18/2020 0920   EOSABS 0.0 05/18/2020 0920   BASOSABS 0.1 05/18/2020 0920   BMP Latest Ref Rng & Units 06/02/2020 06/01/2020 05/31/2020  Glucose 70 - 99 mg/dL 216(H) 128(H) 78  BUN 8 - 23 mg/dL 55(H) 51(H) 42(H)  Creatinine 0.61 - 1.24 mg/dL 2.16(H) 2.06(H) 1.49(H)  Sodium 135 - 145 mmol/L 146(H) 144 149(H)  Potassium 3.5 - 5.1 mmol/L 3.8 4.2 3.8  Chloride 98 - 111 mmol/L 113(H) 112(H) 112(H)  CO2 22 - 32 mmol/L $RemoveB'24 22 28  'aZTUvhHt$ Calcium 8.9 - 10.3 mg/dL 7.5(L) 7.5(L) 8.1(L)    IMAGING past 24 hours  EEG adult Result Date: 05/14/2020  IMPRESSION: This study is suggestive of moderate to evere diffuse encephalopathy, nonspecific etiology. No seizures or epileptiform discharges were seen throughout the recording.  PHYSICAL EXAM Temp:  [97.7 F (36.5 C)-98.1 F (36.7 C)] 98.1 F (36.7 C) (03/18 1139) Pulse Rate:  [64-104] 87 (03/18 1139) Resp:  [38-42] 40 (03/18 1139) BP: (114-141)/(71-97) 134/81 (03/18 1139) SpO2:  [95 %-100 %] 99 % (03/18 1139) Weight:  [104.3 kg] 104.3 kg (03/18 0500)  General: Comfortably  laying in bed and sleeping. HEENT: Normocephalic atraumatic Cardiovascular: Irregularly irregular Respiratory: Scattered rales and rhonchi with mildly increased effort of breathing Abdomen: Mildly tender,  extremities warm well perfused  . Neurological Exam Drowsy but can be aroused.  Follows simple midline commands Arouses easily with stimulation Open eyes Was able to able to follow only occasional midline commands.   Was not able to verbalize Jason name today Inconsistent blink to threat from both sides Withdraws all 4 extremities slightly to noxious stimuli.  MED LIST  Current Facility-Administered Medications:  .  0.9 %  sodium chloride infusion, 250 mL, Intravenous, Continuous, Clark, Venita Sheffield, DO .  0.9 %  sodium chloride infusion, , Intravenous, Continuous, Domenic Polite, MD, Last Rate: 75 mL/hr at 06/01/20 2147, New Bag at 06/01/20 2147 .  acetaminophen (TYLENOL) tablet 650 mg, 650 mg, Oral, Q4H PRN **OR** acetaminophen (TYLENOL) 160 MG/5ML solution 650 mg, 650 mg, Per Tube, Q4H PRN, 650 mg at 05/31/20 1351 **OR** acetaminophen (TYLENOL) suppository 650 mg, 650 mg, Rectal, Q4H PRN, Bhagat, Srishti L, MD, 650 mg at 05/31/20 0816 .  amantadine (SYMMETREL) 50 MG/5ML solution 100 mg, 100 mg, Per Tube, BID, Cyndia Skeeters, Taye T, MD, 100 mg at 06/02/20 0927 .  anidulafungin (ERAXIS) 200 mg in sodium chloride 0.9 % 200 mL IVPB, 200 mg, Intravenous, Once **FOLLOWED BY** [START ON 06/03/2020] anidulafungin (ERAXIS)  100 mg in sodium chloride 0.9 % 100 mL IVPB, 100 mg, Intravenous, Q24H, Tommy Medal, Lavell Islam, MD .  chlorhexidine gluconate (MEDLINE KIT) (PERIDEX) 0.12 % solution 15 mL, 15 mL, Mouth Rinse, BID, Noemi Chapel P, DO, 15 mL at 06/02/20 0908 .  diltiazem (CARDIZEM) tablet 30 mg, 30 mg, Per Tube, Q8H, Domenic Polite, MD, 30 mg at 06/02/20 0606 .  enoxaparin (LOVENOX) injection 40 mg, 40 mg, Subcutaneous, Daily, Garvin Fila, MD, 40 mg at 06/02/20 0925 .  feeding supplement (JEVITY 1.5  CAL/FIBER) liquid 1,000 mL, 1,000 mL, Per Tube, Continuous, Domenic Polite, MD, Last Rate: 55 mL/hr at 06/02/20 0428, 1,000 mL at 06/02/20 0428 .  feeding supplement (PROSource TF) liquid 45 mL, 45 mL, Per Tube, TID, Spero Geralds, MD, 45 mL at 06/02/20 0927 .  fluconazole (DIFLUCAN) IVPB 400 mg, 400 mg, Intravenous, Q24H, Tommy Medal, Lavell Islam, MD .  free water 300 mL, 300 mL, Per Tube, Q6H, Domenic Polite, MD, 300 mL at 06/02/20 0605 .  Gerhardt's butt cream, , Topical, PRN, Merlene Laughter F, NP, 1 application at 62/70/35 1437 .  insulin aspart (novoLOG) injection 0-20 Units, 0-20 Units, Subcutaneous, Q4H, Magdalen Spatz, NP, 7 Units at 06/02/20 1321 .  levETIRAcetam (KEPPRA) 100 MG/ML solution 750 mg, 750 mg, Per Tube, BID, Amie Portland, MD, 750 mg at 06/02/20 0925 .  MEDLINE mouth rinse, 15 mL, Mouth Rinse, q12n4p, Amie Portland, MD, 15 mL at 06/01/20 1617 .  metoprolol tartrate (LOPRESSOR) tablet 25 mg, 25 mg, Per Tube, BID, Domenic Polite, MD, 25 mg at 06/02/20 0093 .  morphine 2 MG/ML injection 2 mg, 2 mg, Intravenous, Q4H PRN, Dellinger, Marianne L, PA-C, 2 mg at 06/01/20 0057 .  ondansetron (ZOFRAN) injection 4 mg, 4 mg, Intravenous, Q6H PRN, Kipp Brood, MD, 4 mg at 05/19/20 1339 .  pantoprazole sodium (PROTONIX) 40 mg/20 mL oral suspension 40 mg, 40 mg, Per Tube, Daily, Julian Hy, DO, 40 mg at 06/02/20 8182 .  piperacillin-tazobactam (ZOSYN) IVPB 3.375 g, 3.375 g, Intravenous, Q8H, Kris Mouton, [facility], Last Rate: 12.5 mL/hr at 06/02/20 0221, 3.375 g at 06/02/20 0221 .  polyethylene glycol (MIRALAX / GLYCOLAX) packet 17 g, 17 g, Per Tube, Daily PRN, Mercy Riding, MD .  Resource ThickenUp Clear, , Oral, PRN, Domenic Polite, MD .  senna-docusate (Senokot-S) tablet 1 tablet, 1 tablet, Per Tube, BID PRN, Wendee Beavers T, MD .  thiamine tablet 100 mg, 100 mg, Per Tube, Daily, Bailey-Modzik, Delila A, NP, 100 mg at 06/02/20 9937   ASSESSMENT/PLAN Jason [name] is a 70 y.o. male  with history of HTN, DM, CAD/MI, afib on eliquis presenting with AMS, garbled speech, N/V and behavior change.   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 Including filling both the third ventricle with layering blood in the fourth and lateral ventricle  Repeat CT head 05/21/2020-done for concern for rightward gaze-with no new changes. CTA head and neck with no LVO. Thalamic ICH is resolving and blood seen layered in the posterior horns of lateral ventricles.  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 He did come in this morning for discussion as above. Our discussion indicated he will need ongoing support for low health literacy, realistic goal setting and education regarding Jason [name]'s illness. He wishes to proceed with full treatment.  Discussed with Dr. Cyndia Skeeters who agrees to palliative care consult. I called the consult today. They may not be able to address until tomorrow.  Son was asked to provide additional contact information so he can be reached more readily.   Palliative care consult is appreciated but family wants full support   Jason facility staff reported Jason baseline functioning was  very slow to speak, able to independently transfer to  chair.   IVH  S/p EVD  2/27 Neurosurgery instilled tPA into EVD  3/1 EVD weaned to 20 cm H20, ICPs  stable  3/2 failed clamping trial  3/5 EVD accidentally dislodged. Reintubated.   CT head repeat 3/6 stable, repeat at night due to concern for gaze deviation also stable.   3/7NSG signed off.   Extubated 3/8. EEG showed no seizure  Hypertensive emergency  Home meds:  Lotensin, metoprolol  Stable on the low end  On Benazepril $RemoveBefor'40mg'YhoFbskZQXuJ$  daily, metoprolol $RemoveBeforeDEI'50mg'eDWiITyRwBczqchr$  bid, cardizem 60 Q6 . 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   Continue tele monitoring   EF 45%   Concern for seizure 3 nights ago. No recurrence   EEG with no evidence of seizures.  Severe diffuse encephalopathy of nonspecific etiology.  Continue Keppra 750 twice daily   HYPERNATREMIA -Likely multifactorial including medicines like Zosyn.  Zosyn discontinued 3/8. Free water was added.  Trending BMP. -Remains hyponatremic with sodium 148->151->151->151-->151 -Continue free water-appreciate CCM management.  ABDOMINAL TENDERNESS -Mild transaminitis.  Abdominal x-ray unremarkable. -If has more tenderness, will consider an ultrasound.  Appreciate CCM assistance.  Respiratory failure and LLL Pneumonia/sinusitis Fever - resolved  Extubated 3/1, vomited post extubation  Tachypnea re-intubated 3/5 and extubated 3/8  CCM on board and appreciated  Empiric antibiotics-completed 5 days of Zosyn.   WBC 8.2  (stable in last 4 days)  Tmax 100.2  Blood cultures 3/4 pending-no growth in 4 days  Appreciate CCM assistance with management  Dysphagia  Likely due to hemorrhagic stroke  Failed swallow eval  On TF @ 50  Speech on board  Waxing and waning mentation has been prohibitive for       safe swallowing  High aspiration risk   Attempting to reach family has been unsuccessful. Left   voicemail with son today.   SLP unable to assess today due to somnolence.  Hyperlipidemia  Home meds:  crestor 20  LDL 84, goal < 70  May continue statin at discharge  Diabetes type II Uncontrolled with hyperglycemia and episodes of hpoglycemia.  Home meds:  insulin  HgbA1c 9.4, goal < 7.0  SSI  Appreciate CCM assistance in management  Other Stroke Risk Factors  Advanced Age >/= 63   Hx stroke/TIA    .  Had received antibiotics for aspiration pneumonia cases of has now been cleared for dysphagia 1 pured solids and honey thick  liquid diet.  We will need to monitor Jason intake carefully over the next few days and if he can eat satisfactorily he can go to next week.  I am not quite sure what to make out of her single blood culture of Candida in the blood though majority of cases described in literature have been Candida albicans ot sure if candida glabrata can do this.  Patient neurological exam is mostly.  Will await opinion from Dr. Vertell Limber neurosurgeon`sopinion as well.  May need diagnostic spinal tap discussed with Dr. Darryll Capers.  Continue ongoing medical care.    .  Greater than 50% time during this 25-minute visit was spent on counseling and coordination of care about Jason intracerebral hemorrhage and dysphagia and answering question Antony Contras, MD Medical Director [facility] Pager: [phone] 06/02/2020 1:32 PM

## 2020-06-02 NOTE — Progress Notes (Signed)
OT Cancellation Note  Patient Details Name: Azarian Starace MRN: [id number] DOB: [birth year]   Cancelled Treatment:    Reason Eval/Treat Not Completed: Patient at procedure or test/ unavailable (MRI. Will return as schedule allows.)  Dorothy Polhemus M Abdulwahab Demelo Lon Klippel MSOT, OTR/L Acute Rehab Pager: [phone] Office: [phone] 06/02/2020, 1:25 PM

## 2020-06-02 NOTE — Progress Notes (Addendum)
Subjective: Patient still confused but improving   Antibiotics:  Anti-infectives (From admission, onward)   Start     Dose/Rate Route Frequency Ordered Stop   06/03/20 1300  anidulafungin (ERAXIS) 100 mg in sodium chloride 0.9 % 100 mL IVPB       "Followed by" Linked Group Details   100 mg 78 mL/hr over 100 Minutes Intravenous Every 24 hours 06/02/20 1119     06/02/20 1600  fluconazole (DIFLUCAN) IVPB 400 mg        400 mg 100 mL/hr over 120 Minutes Intravenous Every 24 hours 06/02/20 1119     06/02/20 1300  anidulafungin (ERAXIS) 200 mg in sodium chloride 0.9 % 200 mL IVPB       "Followed by" Linked Group Details   200 mg 78 mL/hr over 200 Minutes Intravenous  Once 06/02/20 1119     06/02/20 1200  anidulafungin (ERAXIS) 100 mg in sodium chloride 0.9 % 100 mL IVPB  Status:  Discontinued        100 mg 78 mL/hr over 100 Minutes Intravenous Every 24 hours 06/01/20 1230 06/01/20 1249   06/02/20 1000  voriconazole (VFEND) tablet 200 mg  Status:  Discontinued        200 mg Oral Every 12 hours 06/01/20 1347 06/01/20 1436   06/02/20 1000  voriconazole (VFEND) tablet 200 mg  Status:  Discontinued        200 mg Per Tube Every 12 hours 06/01/20 1436 06/02/20 1119   06/01/20 1445  voriconazole (VFEND) tablet 400 mg        400 mg Per Tube Every 12 hours 06/01/20 1436 06/01/20 2145   06/01/20 1430  voriconazole (VFEND) tablet 400 mg  Status:  Discontinued        400 mg Oral Every 12 hours 06/01/20 1347 06/01/20 1436   06/01/20 1330  anidulafungin (ERAXIS) 200 mg in sodium chloride 0.9 % 200 mL IVPB  Status:  Discontinued        200 mg 78 mL/hr over 200 Minutes Intravenous  Once 06/01/20 1230 06/01/20 1249   06/01/20 1300  vancomycin (VANCOREADY) IVPB 1000 mg/200 mL  Status:  Discontinued        1,000 mg 200 mL/hr over 60 Minutes Intravenous Every 24 hours 05/31/20 1323 06/01/20 0928   05/31/20 1900  piperacillin-tazobactam (ZOSYN) IVPB 3.375 g        3.375 g 12.5 mL/hr over 240  Minutes Intravenous Every 8 hours 05/31/20 1012     05/31/20 1100  vancomycin (VANCOREADY) IVPB 2000 mg/400 mL        2,000 mg 200 mL/hr over 120 Minutes Intravenous  Once 05/31/20 1012 05/31/20 1634   05/31/20 1100  piperacillin-tazobactam (ZOSYN) IVPB 3.375 g        3.375 g 100 mL/hr over 30 Minutes Intravenous  Once 05/31/20 1012 05/31/20 1406   05/21/20 1100  vancomycin (VANCOREADY) IVPB 1750 mg/350 mL  Status:  Discontinued        1,750 mg 175 mL/hr over 120 Minutes Intravenous Every 24 hours 05/20/20 0953 05/23/20 1046   05/20/20 1045  vancomycin (VANCOREADY) IVPB 2000 mg/400 mL        2,000 mg 200 mL/hr over 120 Minutes Intravenous  Once 05/20/20 0948 05/20/20 1341   05/19/20 1015  cefTRIAXone (ROCEPHIN) 2 g in sodium chloride 0.9 % 100 mL IVPB  Status:  Discontinued        2 g 200 mL/hr over 30 Minutes Intravenous Every 24  hours 05/18/20 0958 05/18/20 1132   05/18/20 1400  piperacillin-tazobactam (ZOSYN) IVPB 3.375 g  Status:  Discontinued        3.375 g 12.5 mL/hr over 240 Minutes Intravenous Every 8 hours 05/18/20 1132 05/23/20 1046   05/17/20 1015  cefTRIAXone (ROCEPHIN) 1 g in sodium chloride 0.9 % 100 mL IVPB  Status:  Discontinued        1 g 200 mL/hr over 30 Minutes Intravenous Every 24 hours 05/17/20 0918 05/18/20 0958   05/12/20 1530  ceFAZolin (ANCEF) IVPB 1 g/50 mL premix  Status:  Discontinued        1 g 100 mL/hr over 30 Minutes Intravenous Every 8 hours 05/12/20 1440 05/17/20 0918      Medications: Scheduled Meds: . amantadine  100 mg Per Tube BID  . atorvastatin  40 mg Per NG tube q1800  . chlorhexidine gluconate (MEDLINE KIT)  15 mL Mouth Rinse BID  . diltiazem  30 mg Per Tube Q8H  . enoxaparin (LOVENOX) injection  40 mg Subcutaneous Daily  . feeding supplement (PROSource TF)  45 mL Per Tube TID  . free water  300 mL Per Tube Q6H  . insulin aspart  0-20 Units Subcutaneous Q4H  . levETIRAcetam  750 mg Per Tube BID  . mouth rinse  15 mL Mouth Rinse q12n4p   . metoprolol tartrate  25 mg Per Tube BID  . pantoprazole sodium  40 mg Per Tube Daily  . thiamine  100 mg Per Tube Daily   Continuous Infusions: . sodium chloride    . sodium chloride 75 mL/hr at 06/01/20 2147  . anidulafungin     Followed by  . [START ON 06/03/2020] anidulafungin    . feeding supplement (JEVITY 1.5 CAL/FIBER) 1,000 mL (06/02/20 0428)  . fluconazole (DIFLUCAN) IV    . piperacillin-tazobactam (ZOSYN)  IV 3.375 g (06/02/20 0221)   PRN Meds:.acetaminophen **OR** acetaminophen (TYLENOL) oral liquid 160 mg/5 mL **OR** acetaminophen, Gerhardt's butt cream, morphine injection, ondansetron (ZOFRAN) IV, polyethylene glycol, senna-docusate    Objective: Weight change: -2.722 kg  Intake/Output Summary (Last 24 hours) at 06/02/2020 1131 Last data filed at 06/01/2020 1820 Gross per 24 hour  Intake 50 ml  Output 525 ml  Net -475 ml   Blood pressure 130/84, pulse 70, temperature 97.7 F (36.5 C), temperature source Oral, resp. rate (!) 38, height _0  (1.778 m), weight 104.3 kg, SpO2 99 %. Temp:  [97.7 F (36.5 C)-97.9 F (36.6 C)] 97.7 F (36.5 C) (03/18 0803) Pulse Rate:  [64-104] 70 (03/18 0803) Resp:  [38-42] 38 (03/18 0803) BP: (114-141)/(71-97) 130/84 (03/18 0803) SpO2:  [95 %-100 %] 99 % (03/18 0803) Weight:  [104.3 kg] 104.3 kg (03/18 0500)  Physical Exam: Physical Exam HENT:     Head: Normocephalic.  Eyes:     Extraocular Movements: Extraocular movements intact.  Cardiovascular:     Rate and Rhythm: Normal rate.     Heart sounds: No murmur heard. No friction rub. No gallop.   Pulmonary:     Effort: Pulmonary effort is normal. No respiratory distress.     Breath sounds: No stridor. No wheezing.  Skin:    General: Skin is warm and dry.  Neurological:     General: No focal deficit present.     Mental Status: He is alert.   He continues to follow commands he named his son he responds to his name  CBC:    BMET Recent Labs    06/01/20 [phone]  06/02/20 0404  NA 144 146*  K 4.2 3.8  CL 112* 113*  CO2 22 24  GLUCOSE 128* 216*  BUN 51* 55*  CREATININE 2.06* 2.16*  CALCIUM 7.5* 7.5*     Liver Panel  Recent Labs    05/31/20 0331  PROT 6.4*  ALBUMIN 2.0*  AST 35  ALT 46*  ALKPHOS 66  BILITOT 0.6       Sedimentation Rate No results for input(s): ESRSEDRATE in the last 72 hours. C-Reactive Protein No results for input(s): CRP in the last 72 hours.  Micro Results: Recent Results (from the past 720 hour(s))  Blood Cultures (routine x 2)     Status: None   Collection Time: 05/11/20  7:36 PM   Specimen: Site Not Specified; Blood  Result Value Ref Range Status   Specimen Description   Final    SITE NOT SPECIFIED Performed at [facility] [address] [place], [place] [address]    Special Requests   Final    BOTTLES DRAWN AEROBIC AND ANAEROBIC Blood Culture results may not be optimal due to an inadequate volume of blood received in culture bottles Performed at [facility] [address], [place], [place] [address]    Culture   Final    NO GROWTH 5 DAYS Performed at [facility], [place] [address], [place], [place] [address]    Report Status 05/16/2020 FINAL  Final  Blood Cultures (routine x 2)     Status: None   Collection Time: 05/11/20  7:36 PM   Specimen: BLOOD  Result Value Ref Range Status   Specimen Description   Final    BLOOD LEFT WRIST Performed at [facility] [address] [place], [place] [address]    Special Requests   Final    BOTTLES DRAWN AEROBIC AND ANAEROBIC Blood Culture adequate volume Performed at [facility] [address] [place], [place] [address]    Culture   Final    NO GROWTH 5 DAYS Performed at [facility], [place] [address], [place], [place] [address]    Report Status 05/16/2020 FINAL  Final  Urine culture     Status: None   Collection Time: 05/11/20  9:00 PM   Specimen:  Urine, Random  Result Value Ref Range Status   Specimen Description   Final    URINE, RANDOM Performed at [facility] [address], [place], [place] [address]    Special Requests   Final    NONE Performed at [facility], [place] [address] [place], [place] [address]    Culture   Final    NO GROWTH Performed at [facility], [place] [address] [place], [place] [address]    Report Status 05/13/2020 FINAL  Final  Resp Panel by RT-PCR (Flu A&B, Covid) Nasopharyngeal Swab     Status: None   Collection Time: 05/11/20  9:30 PM   Specimen: Nasopharyngeal Swab; Nasopharyngeal(NP) swabs in vial transport medium  Result Value Ref Range Status   SARS Coronavirus 2 by RT PCR NEGATIVE NEGATIVE Final    Comment: (NOTE) SARS-CoV-2 target nucleic acids are NOT DETECTED.  The SARS-CoV-2 RNA is generally detectable in upper respiratory specimens during the acute phase of infection. The lowest concentration of SARS-CoV-2 viral copies this assay can detect is 138 copies/mL. A negative result does not preclude SARS-Cov-2 infection and should not be used as the sole basis for treatment  or other patient management decisions. A negative result may occur with  improper specimen collection/handling, submission of specimen other than nasopharyngeal swab, presence of viral mutation(s) within the areas targeted by this assay, and inadequate number of viral copies(<138 copies/mL). A negative result must be combined with clinical observations, patient history, and epidemiological information. The expected result is Negative.  Fact Sheet for Patients:  EntrepreneurPulse.com.au  Fact Sheet for Healthcare Providers:  IncredibleEmployment.be  This test is no t yet approved or cleared by the [place] FDA and  has been authorized for detection and/or diagnosis of SARS-CoV-2 by FDA under an Emergency Use Authorization (EUA). This  EUA will remain  in effect (meaning this test can be used) for the duration of the COVID-19 declaration under Section 564(b)(1) of the Act, 21 U.S.C.section 360bbb-3(b)(1), unless the authorization is terminated  or revoked sooner.       Influenza A by PCR NEGATIVE NEGATIVE Final   Influenza B by PCR NEGATIVE NEGATIVE Final    Comment: (NOTE) The Xpert Xpress SARS-CoV-2/FLU/RSV plus assay is intended as an aid in the diagnosis of influenza from Nasopharyngeal swab specimens and should not be used as a sole basis for treatment. Nasal washings and aspirates are unacceptable for Xpert Xpress SARS-CoV-2/FLU/RSV testing.  Fact Sheet for Patients: EntrepreneurPulse.com.au  Fact Sheet for Healthcare Providers: IncredibleEmployment.be  This test is not yet approved or cleared by the [place] FDA and has been authorized for detection and/or diagnosis of SARS-CoV-2 by FDA under an Emergency Use Authorization (EUA). This EUA will remain in effect (meaning this test can be used) for the duration of the COVID-19 declaration under Section 564(b)(1) of the Act, 21 U.S.C. section 360bbb-3(b)(1), unless the authorization is terminated or revoked.  Performed at [facility], [place] [address], [place], [place] [address]   MRSA PCR Screening     Status: None   Collection Time: 05/12/20 12:26 AM   Specimen: Nasopharyngeal Swab  Result Value Ref Range Status   MRSA by PCR NEGATIVE NEGATIVE Final    Comment:        The GeneXpert MRSA Assay (FDA approved for NASAL specimens only), is one component of a comprehensive MRSA colonization surveillance program. It is not intended to diagnose MRSA infection nor to guide or monitor treatment for MRSA infections. Performed at [facility], [place] [address], [place], [place] [address]   Expectorated Sputum Assessment w Gram Stain, Rflx to Resp Cult     Status: None   Collection Time:  05/17/20 11:29 PM   Specimen: Sputum  Result Value Ref Range Status   Specimen Description SPUTUM  Final   Special Requests NONE  Final   Sputum evaluation   Final    THIS SPECIMEN IS ACCEPTABLE FOR SPUTUM CULTURE Performed at [facility], [place] [address], [place], [place] [address]    Report Status 05/18/2020 FINAL  Final  Culture, Respiratory w Gram Stain     Status: None   Collection Time: 05/17/20 11:29 PM   Specimen: SPU  Result Value Ref Range Status   Specimen Description SPUTUM  Final   Special Requests NONE Reflexed from [id number]  Final   Gram Stain   Final    FEW WBC PRESENT, PREDOMINANTLY PMN FEW GRAM NEGATIVE RODS FEW GRAM POSITIVE COCCI IN PAIRS IN CLUSTERS RARE GRAM POSITIVE RODS    Culture   Final    FEW Normal respiratory flora-no Staph aureus or Pseudomonas seen Performed at [facility],  [address] [place], [place] [address]    Report Status 05/20/2020 FINAL  Final  Culture, blood (Routine X 2) w Reflex to ID Panel     Status: None   Collection Time: 05/19/20 12:44 AM   Specimen: BLOOD  Result Value Ref Range Status   Specimen Description BLOOD RIGHT ARM  Final   Special Requests   Final    BOTTLES DRAWN AEROBIC AND ANAEROBIC Blood Culture adequate volume   Culture   Final    NO GROWTH 5 DAYS Performed at [facility], [place] [address] [place], [place] [address]    Report Status 05/24/2020 FINAL  Final  Culture, blood (Routine X 2) w Reflex to ID Panel     Status: None   Collection Time: 05/19/20 12:59 AM   Specimen: BLOOD RIGHT HAND  Result Value Ref Range Status   Specimen Description BLOOD RIGHT HAND  Final   Special Requests   Final    BOTTLES DRAWN AEROBIC AND ANAEROBIC Blood Culture results may not be optimal due to an inadequate volume of blood received in culture bottles   Culture   Final    NO GROWTH 5 DAYS Performed at [facility], [place] [address], [place], [place] [address]    Report Status 05/24/2020 FINAL   Final  Expectorated Sputum Assessment w Gram Stain, Rflx to Resp Cult     Status: None   Collection Time: 05/19/20  6:30 AM   Specimen: Expectorated Sputum  Result Value Ref Range Status   Specimen Description EXPECTORATED SPUTUM  Final   Special Requests Normal  Final   Sputum evaluation   Final    THIS SPECIMEN IS ACCEPTABLE FOR SPUTUM CULTURE Performed at [facility], [place] [address], [place], [place] [address]    Report Status 05/22/2020 FINAL  Final  Culture, Respiratory w Gram Stain     Status: None   Collection Time: 05/19/20  6:30 AM  Result Value Ref Range Status   Specimen Description EXPECTORATED SPUTUM  Final   Special Requests Normal Reflexed from [id number]  Final   Gram Stain   Final    ABUNDANT WBC PRESENT,BOTH PMN AND MONONUCLEAR FEW SQUAMOUS EPITHELIAL CELLS PRESENT FEW GRAM POSITIVE COCCI RARE GRAM VARIABLE ROD    Culture   Final    RARE Normal respiratory flora-no Staph aureus or Pseudomonas seen Performed at [facility], [place] [address], [place], [place] [address]    Report Status 05/21/2020 FINAL  Final  Culture, blood (routine x 2)     Status: Abnormal (Preliminary result)   Collection Time: 05/31/20  9:28 AM   Specimen: BLOOD  Result Value Ref Range Status   Specimen Description BLOOD LEFT ANTECUBITAL  Final   Special Requests   Final    BOTTLES DRAWN AEROBIC AND ANAEROBIC Blood Culture results may not be optimal due to an inadequate volume of blood received in culture bottles   Culture  Setup Time (A)  Final    YEAST ANAEROBIC BOTTLE ONLY CRITICAL RESULT CALLED TO, READ BACK BY AND VERIFIED WITH: [place] J. [id number] FCP    Culture (A)  Final    CANDIDA GLABRATA CULTURE REINCUBATED FOR BETTER GROWTH Performed at [facility], [place] [address], [place], [place] [address]    Report Status PENDING  Incomplete  Blood Culture ID Panel (Reflexed)     Status: Abnormal   Collection Time: 05/31/20  9:28 AM  Result Value Ref Range  Status   Enterococcus faecalis NOT DETECTED NOT DETECTED Final   Enterococcus Faecium NOT DETECTED NOT DETECTED Final   Listeria monocytogenes NOT DETECTED NOT DETECTED Final   Staphylococcus species NOT DETECTED NOT DETECTED Final   Staphylococcus aureus (BCID) NOT DETECTED NOT DETECTED Final   Staphylococcus epidermidis NOT DETECTED NOT DETECTED Final   Staphylococcus lugdunensis NOT DETECTED NOT DETECTED Final   Streptococcus species NOT DETECTED NOT DETECTED Final   Streptococcus agalactiae NOT DETECTED NOT DETECTED Final   Streptococcus pneumoniae NOT DETECTED NOT DETECTED Final   Streptococcus pyogenes NOT DETECTED NOT DETECTED Final   A.calcoaceticus-baumannii NOT DETECTED NOT DETECTED Final   Bacteroides fragilis NOT DETECTED NOT DETECTED Final   Enterobacterales NOT DETECTED NOT DETECTED Final   Enterobacter cloacae complex NOT DETECTED NOT DETECTED Final   Escherichia coli NOT DETECTED NOT DETECTED Final   Klebsiella aerogenes NOT DETECTED NOT DETECTED Final   Klebsiella oxytoca NOT DETECTED NOT DETECTED Final   Klebsiella pneumoniae NOT DETECTED NOT DETECTED Final   Proteus species NOT DETECTED NOT DETECTED Final   Salmonella species NOT DETECTED NOT DETECTED Final   Serratia marcescens NOT DETECTED NOT DETECTED Final   Haemophilus influenzae NOT DETECTED NOT DETECTED Final   Neisseria meningitidis NOT DETECTED NOT DETECTED Final   Pseudomonas aeruginosa NOT DETECTED NOT DETECTED Final   Stenotrophomonas maltophilia NOT DETECTED NOT DETECTED Final   Candida albicans NOT DETECTED NOT DETECTED Final   Candida auris NOT DETECTED NOT DETECTED Final   Candida glabrata DETECTED (A) NOT DETECTED Final    Comment: CRITICAL RESULT CALLED TO, READ BACK BY AND VERIFIED WITH: PHARMD THOMAS J. [id number] FCP    Candida krusei NOT DETECTED NOT DETECTED Final   Candida parapsilosis NOT DETECTED NOT DETECTED Final   Candida tropicalis NOT DETECTED NOT DETECTED Final   Cryptococcus  neoformans/gattii NOT DETECTED NOT DETECTED Final    Comment: Performed at [facility], [address], [place], [place] [address]  Culture, blood (routine x 2)     Status: None (Preliminary result)   Collection Time: 05/31/20  9:36 AM   Specimen: BLOOD LEFT HAND  Result Value Ref Range Status   Specimen Description BLOOD LEFT HAND  Final   Special Requests   Final    BOTTLES DRAWN AEROBIC AND ANAEROBIC Blood Culture results may not be optimal due to an inadequate volume of blood received in culture bottles   Culture  Setup Time   Final    YEAST ANAEROBIC BOTTLE ONLY CRITICAL VALUE NOTED.  VALUE IS CONSISTENT WITH PREVIOUSLY REPORTED AND CALLED VALUE.    Culture   Final    YEAST IDENTIFICATION TO FOLLOW Performed at [facility], [place] [address], [place], [place] [address]    Report Status PENDING  Incomplete  MRSA PCR Screening     Status: None   Collection Time: 05/31/20  3:52 PM   Specimen: Nasal Mucosa; Nasopharyngeal  Result Value Ref Range Status   MRSA by PCR NEGATIVE NEGATIVE Final    Comment:        The GeneXpert MRSA Assay (FDA approved for NASAL specimens only), is one component of a comprehensive MRSA colonization surveillance program. It is not intended to diagnose MRSA infection nor to guide or monitor treatment for MRSA infections. Performed at [facility], [place] [address] [place], [place] [address]     Studies/Results: No results found.    Assessment/Plan:  INTERVAL HISTORY:  Patient's cognitive status continues to improve  Active  Problems:   Pressure injury of skin   IVH (intraventricular hemorrhage) (HCC)   Altered mental status   Encounter for intubation   Hypertensive emergency   Atrial flutter ([place])    Jason [name] is a 70 y.o. male with Struve coronary disease diabetes mellitus paroxysmal atrial fibrillation stroke systolic heart failure who was admitted to the ICU with thalamic hemorrhage with intraventricular  extension that required reversal of his anticoagulation intubation and placement of an external ventricular drain.  He was then reintubated but fortunately successfully extubated.  Drain has been out.  He does not appear to have had any central lines.  He became febrile to 102.8 and blood cultures were taken and he was started on vancomycin and Zosyn.  Chest x-ray it showed a possible left lower infiltrate he was narrowed to Zosyn in the interim his blood cultures turned positive for Candida glabrata  #1 Candida glabrata fungemia: not clear what source was  --eraxis reinstituted as a better drug for this bug --We also added fluconazole on board in case there is need for CNS penetration in other words if he has fungal meningitis and placement of his infection ventricular drain --I am ordering an MRI of the brain but without contrast due to his acute kidney injury.  I am not sure as can be particularly revealing in terms of being able to exclude meningitis --Would consider lumbar puncture  Alternatively if we are not able to rule in or rule out central nervous system infection via imaging and/or LP I would consider narrowing to Eraxis which does not have good CNS penetration and see how his course progresses and if he shows continued cognitive improvement and has no evidence of a headache would then simply treat this as a fungemia that was likely acquired through an IV site though not clear which one. Perhaps he also might have intertrigo or skin infection that led tot his I could not see this on exam  Stopped his statin while he is on the 80s all if we get rid of the is all we can have him reinstitute his statin.  I going to repeat his blood cultures today.  He will need a dilated funduscopic exam by ophthalmology to exclude fungal endophthalmitis  I spent greater than 35 minutes with the patient including greater than 50% of time in face to face counsel of the patient  And his 2 sons and in  coordination of his care.   I will have my partner Dr Gale Journey check on him this weekend and I will be back on Monday.   LOS: 22 days   Alcide Evener 06/02/2020, 11:31 AM

## 2020-06-02 NOTE — Progress Notes (Addendum)
Daily Progress Note   Patient Name: Jason [name]       Date: 06/02/2020 DOB: 04-06-1950  Age: 70 y.o. MRN#: [id number] Attending Physician: Domenic Polite, MD Primary Care Physician: Jodi Marble, MD Admit Date: 05/11/2020  Reason for Consultation/Follow-up: Establishing goals of care  Subjective:  Jason Kaufman, NP and I were updated by speech therapist that she had completed FEES test- patient had a large mucous plug which requiring aggressive suction. She stated that patient was more awake today due to the suctioning. She recommended humidified nasal cannula.  They will attempt to advance diet to see how patient tolerates.   We met with patient's sons at bedside, Jason [name] - younger brother and Jason [name] - older brother. Jason [name] was optimistic about his father's condition. He said that patient looked "tremendously better" compared to when he last saw patient in Saturday. When we saw patient, he appeared in mild distress due to his breathing. He was drowsy but awake verbal stimulation.  Patient was not able to answer any orientation question but able to recognize his sons.  Patient fell back to sleep multiple times during questioning.  He sometimes opened his eyes and raised his left arm to reach for something in the air.  Patient was able to follow simple commands.  Patient was able to verbalize that he is cold.  We explained to Jason [name] and Jason [name] that patient's prognosis remains guarded.  He might be more awake today due to aggressive suctioning earlier.  We expressed that we wanted to be prepared for the worst case scenario and focus on his quality of life.  Jason [name] remained optimistic about patient's condition and stated that patient would get through this and enjoyed his quality of life outside  hospital.  He only trusted what he saw, which patient was more alert today.  He stated that is good to have some hope.   We spoke to Jason [name] in private.  He he seemed to have a better understanding of patient's overall condition and prognosis.  He would like to monitor patient in the next few days to see if he continues to improve.  Plan is to observe over the weekend and have a family meeting on Monday with Jason [name] and patient's mother.   Review of Systems  Constitutional: Negative.   Respiratory: Positive for  sputum production. Negative for cough and wheezing.   Cardiovascular: Negative.     Length of Stay: 22  Current Medications: Scheduled Meds:  . amantadine  100 mg Per Tube BID  . atorvastatin  40 mg Per NG tube q1800  . chlorhexidine gluconate (MEDLINE KIT)  15 mL Mouth Rinse BID  . diltiazem  30 mg Per Tube Q8H  . enoxaparin (LOVENOX) injection  40 mg Subcutaneous Daily  . feeding supplement (PROSource TF)  45 mL Per Tube TID  . free water  300 mL Per Tube Q6H  . insulin aspart  0-20 Units Subcutaneous Q4H  . levETIRAcetam  750 mg Per Tube BID  . mouth rinse  15 mL Mouth Rinse q12n4p  . metoprolol tartrate  25 mg Per Tube BID  . pantoprazole sodium  40 mg Per Tube Daily  . thiamine  100 mg Per Tube Daily    Continuous Infusions: . sodium chloride    . sodium chloride 75 mL/hr at 06/01/20 2147  . anidulafungin     Followed by  . [START ON 06/03/2020] anidulafungin    . feeding supplement (JEVITY 1.5 CAL/FIBER) 1,000 mL (06/02/20 0428)  . fluconazole (DIFLUCAN) IV    . piperacillin-tazobactam (ZOSYN)  IV 3.375 g (06/02/20 0221)    PRN Meds: acetaminophen **OR** acetaminophen (TYLENOL) oral liquid 160 mg/5 mL **OR** acetaminophen, Gerhardt's butt cream, morphine injection, ondansetron (ZOFRAN) IV, polyethylene glycol, senna-docusate  Physical Exam Constitutional:      Appearance: He is ill-appearing.     Comments: Drowsy but awakes to verbal stimulation.  Mild acute  distress from his mouth breathing  HENT:     Head: Normocephalic.  Eyes:     General:        Right eye: No discharge.        Left eye: No discharge.     Comments: Droopy right eye  Pulmonary:     Effort: Respiratory distress present.  Neurological:     Comments: Able to follow simple command Very minimal movement of bilateral lower extremities  Psychiatric:        Mood and Affect: Mood normal.             Vital Signs: BP 130/84 (BP Location: Left Arm)   Pulse 70   Temp 97.7 F (36.5 C) (Oral)   Resp (!) 38   Ht _0  (1.778 m)   Wt 104.3 kg   SpO2 99%   BMI 33.00 kg/m  SpO2: SpO2: 99 % O2 Device: O2 Device: Nasal Cannula O2 Flow Rate: O2 Flow Rate (L/min): 2 L/min  Intake/output summary:   Intake/Output Summary (Last 24 hours) at 06/02/2020 1131 Last data filed at 06/01/2020 1820 Gross per 24 hour  Intake 50 ml  Output 525 ml  Net -475 ml   LBM: Last BM Date: 05/30/20 Baseline Weight: Weight: 100 kg Most recent weight: Weight: 104.3 kg       Palliative Assessment/Data: 20%      Patient Active Problem List   Diagnosis Date Noted  . Atrial flutter ([place])   . Altered mental status   . Encounter for intubation   . Hypertensive emergency   . IVH (intraventricular hemorrhage) ([place]) 05/11/2020  . Pressure injury of skin 03/02/2020  . DKA, type 2 ([place]) 02/27/2020  . Paroxysmal A-fib ([place]) 02/27/2020  . Elevated troponin 02/27/2020  . CAD (coronary artery disease), native coronary artery 02/27/2020  . Hyperosmolar hyperglycemic state (HHS) ([place]) 02/26/2020  . Essential hypertension 10/29/2019  . Type  2 diabetes mellitus ([place]) 10/29/2019    Palliative Care Assessment & Plan   Patient Profile: 70 y.o.malewith past medical history of DM, previous CVA, CAD/MI/CABG/Afib on anticoagulation, anxiety and depressionwho was admitted on 2/24/2022with altered mental status and hypertensive emergency. Imaging revealed hemorrhage of the left thalamus with intro  ventricular extension.He was intubated and extubated 2x and has remained encephalopathic. He is currently in a floor bed. His albumin is 2.1. 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   Assessment/Recommendations/Plan  I have reviewed the medical record from progress notes labs and imaging   Will attempt to advance diet per speech therapist and see how patient tolerates.  Close monitoring for aspiration  We will continue to monitor his progress over the weekend  Plan to have a family meeting on Monday with Jason [name] and patient's mother to talk about goal of care vs PEG tube placement  Asked RN to place him on humidified oxygen  Please note- patient has two sons and a living mother- per Hoy Morn- patient's mother is cognitive and able to be involved in medical decision making- in [place] in the absence of an HCPOA then surviving children and parents are equal in decision making process- I have added patient's other son and his mother's contact information.    Goals of Care and Additional Recommendations:  Limitations on Scope of Treatment: Avoid Hospitalization  Code Status:  DNR  Prognosis:   Unable to determine  Discharge Planning:  To Be Determined   Thank you for allowing the Palliative Medicine Team to assist in the care of this patient.   Time In: 10:28 Time Out: 11:04 Total Time 36 mins Prolonged Time Billed  no       Greater than 50%  of this time was spent counseling and coordinating care related to the above assessment and plan.  Jason [name], AGNP-C Palliative Medicine  Gaylan Gerold, DO Internal Medicine    Please contact Palliative Medicine Team phone at [phone] for questions and concerns.

## 2020-06-02 NOTE — Plan of Care (Signed)
Not progressing due to altered mental status.   Problem: Safety: Goal: Non-violent Restraint(s) Outcome: Not Progressing   Problem: Education: Goal: Knowledge of disease or condition will improve Outcome: Not Progressing Goal: Knowledge of secondary prevention will improve Outcome: Not Progressing Goal: Knowledge of patient specific risk factors addressed and post discharge goals established will improve Outcome: Not Progressing Goal: Individualized Educational Video(s) Outcome: Not Progressing   Problem: Coping: Goal: Will verbalize positive feelings about self Outcome: Not Progressing Goal: Will identify appropriate support needs Outcome: Not Progressing   Problem: Health Behavior/Discharge Planning: Goal: Ability to manage health-related needs will improve Outcome: Not Progressing   Problem: Self-Care: Goal: Ability to participate in self-care as condition permits will improve Outcome: Not Progressing Goal: Verbalization of feelings and concerns over difficulty with self-care will improve Outcome: Not Progressing Goal: Ability to communicate needs accurately will improve Outcome: Not Progressing   Problem: Nutrition: Goal: Risk of aspiration will decrease Outcome: Not Progressing Goal: Dietary intake will improve Outcome: Not Progressing   Problem: Intracerebral Hemorrhage Tissue Perfusion: Goal: Complications of Intracerebral Hemorrhage will be minimized Outcome: Not Progressing   Problem: Education: Goal: Knowledge of General Education information will improve Description: Including pain rating scale, medication(s)/side effects and non-pharmacologic comfort measures Outcome: Not Progressing   Problem: Health Behavior/Discharge Planning: Goal: Ability to manage health-related needs will improve Outcome: Not Progressing   Problem: Clinical Measurements: Goal: Ability to maintain clinical measurements within normal limits will improve Outcome: Not  Progressing Goal: Will remain free from infection Outcome: Not Progressing Goal: Diagnostic test results will improve Outcome: Not Progressing Goal: Respiratory complications will improve Outcome: Not Progressing Goal: Cardiovascular complication will be avoided Outcome: Not Progressing   Problem: Activity: Goal: Risk for activity intolerance will decrease Outcome: Not Progressing   Problem: Nutrition: Goal: Adequate nutrition will be maintained Outcome: Not Progressing   Problem: Coping: Goal: Level of anxiety will decrease Outcome: Not Progressing   Problem: Elimination: Goal: Will not experience complications related to bowel motility Outcome: Not Progressing Goal: Will not experience complications related to urinary retention Outcome: Not Progressing

## 2020-06-03 DIAGNOSIS — G9341 Metabolic encephalopathy: Secondary | ICD-10-CM

## 2020-06-03 DIAGNOSIS — B49 Unspecified mycosis: Secondary | ICD-10-CM

## 2020-06-03 DIAGNOSIS — A419 Sepsis, unspecified organism: Secondary | ICD-10-CM

## 2020-06-03 DIAGNOSIS — R6520 Severe sepsis without septic shock: Secondary | ICD-10-CM | POA: Diagnosis not present

## 2020-06-03 LAB — BASIC METABOLIC PANEL
Anion gap: 7 (ref 5–15)
BUN: 56 mg/dL — ABNORMAL HIGH (ref 8–23)
CO2: 23 mmol/L (ref 22–32)
Calcium: 7.7 mg/dL — ABNORMAL LOW (ref 8.9–10.3)
Chloride: 116 mmol/L — ABNORMAL HIGH (ref 98–111)
Creatinine, Ser: 2.22 mg/dL — ABNORMAL HIGH (ref 0.61–1.24)
GFR, Estimated: 31 mL/min — ABNORMAL LOW (ref >60)
Glucose, Bld: 224 mg/dL — ABNORMAL HIGH (ref 70–99)
Potassium: 3.8 mmol/L (ref 3.5–5.1)
Sodium: 146 mmol/L — ABNORMAL HIGH (ref 135–145)

## 2020-06-03 LAB — GLUCOSE, CAPILLARY
Glucose-Capillary: 185 mg/dL — ABNORMAL HIGH (ref 70–99)
Glucose-Capillary: 205 mg/dL — ABNORMAL HIGH (ref 70–99)
Glucose-Capillary: 235 mg/dL — ABNORMAL HIGH (ref 70–99)
Glucose-Capillary: 202 mg/dL — ABNORMAL HIGH (ref 70–99)
Glucose-Capillary: 215 mg/dL — ABNORMAL HIGH (ref 70–99)

## 2020-06-03 LAB — CBC
HCT: 39.2 % (ref 39.0–52.0)
Hemoglobin: 12.3 g/dL — ABNORMAL LOW (ref 13.0–17.0)
MCH: 27.8 pg (ref 26.0–34.0)
MCHC: 31.4 g/dL (ref 30.0–36.0)
MCV: 88.7 fL (ref 80.0–100.0)
Platelets: 79 10*3/uL — ABNORMAL LOW (ref 150–400)
RBC: 4.42 MIL/uL (ref 4.22–5.81)
RDW: 15.4 % (ref 11.5–15.5)
WBC: 6.4 10*3/uL (ref 4.0–10.5)
nRBC: 0 % (ref 0.0–0.2)

## 2020-06-03 MED ORDER — JEVITY 1.5 CAL/FIBER PO LIQD
1000.0000 mL | ORAL | Status: DC
  Administered 2020-06-03 – 2020-06-07 (×4): 1000 mL
  Filled 2020-06-03 (×6): qty 1000

## 2020-06-03 MED ORDER — WHITE PETROLATUM EX OINT
TOPICAL_OINTMENT | CUTANEOUS | Status: AC
  Administered 2020-06-03: 0.2
  Filled 2020-06-03: qty 28.35

## 2020-06-03 NOTE — Progress Notes (Signed)
STROKE TEAM PROGRESS NOTE   SUBJECTIVE (INTERVAL HISTORY) No family at the bedside. Pt lethargic however, able to follow simple commands. Mental status seems improving. MRI brain with and without contrast did not suggest CNS infection. ID on board.  Labs     Component Value Date/Time   WBC 6.4 06/03/2020 0117   RBC 4.42 06/03/2020 0117   HGB 12.3 (L) 06/03/2020 0117   HCT 39.2 06/03/2020 0117   PLT 79 (L) 06/03/2020 0117   MCV 88.7 06/03/2020 0117   MCH 27.8 06/03/2020 0117   MCHC 31.4 06/03/2020 0117   RDW 15.4 06/03/2020 0117   LYMPHSABS 1.0 05/18/2020 0920   MONOABS 1.3 (H) 05/18/2020 0920   EOSABS 0.0 05/18/2020 0920   BASOSABS 0.1 05/18/2020 0920   BMP Latest Ref Rng & Units 06/03/2020 06/02/2020 06/01/2020  Glucose 70 - 99 mg/dL 350(K) 938(H) 829(H)  BUN 8 - 23 mg/dL 37(J) 69(C) 78(L)  Creatinine 0.61 - 1.24 mg/dL 3.81(O) 1.75(Z) 0.25(E)  Sodium 135 - 145 mmol/L 146(H) 146(H) 144  Potassium 3.5 - 5.1 mmol/L 3.8 3.8 4.2  Chloride 98 - 111 mmol/L 116(H) 113(H) 112(H)  CO2 22 - 32 mmol/L 23 24 22   Calcium 8.9 - 10.3 mg/dL 7.7(L) 7.5(L) 7.5(L)    IMAGING CT HEAD WO CONTRAST  Result Date: 05/31/2020 CLINICAL DATA:  Mental status change.  Sepsis. EXAM: CT HEAD WITHOUT CONTRAST TECHNIQUE: Contiguous axial images were obtained from the base of the skull through the vertex without intravenous contrast. COMPARISON:  March 6 22. FINDINGS: Brain: Decreased intraventricular hemorrhage layering within the occipital horns bilaterally. No evidence of new hemorrhage. No evidence of acute large vascular territory infarct. Similar patchy white matter hypodensities, most likely related to chronic microvascular ischemic disease. Similar sequela of prior right frontal ventriculostomy with resolution of the previously seen hemorrhage along the tract. Vascular: Calcific atherosclerosis. No hyperdense vessel identified. Skull: Right frontal burr hole.  No acute fracture. Sinuses/Orbits: Opacification  of a left ethmoid air cell. Mucosal thickening of the right sphenoid sinus with air-fluid level and frothy secretions. Unremarkable orbits. Other: Trace bilateral mastoid effusions. IMPRESSION: 1. Decreased intraventricular hemorrhage layering within the occipital horns bilaterally. Similar ventriculomegaly. 2. Paranasal sinus mucosal thickening with frothy secretions and air-fluid level in the right sphenoid sinus. Electronically Signed   By: 11-06-1986 MD   On: 05/31/2020 11:10   MR BRAIN W WO CONTRAST  Result Date: 06/02/2020 CLINICAL DATA:  Thalamic hemorrhage with intraventricular extension. Meningitis/infection suspected. EXAM: MRI HEAD WITHOUT AND WITH CONTRAST TECHNIQUE: Multiplanar, multiecho pulse sequences of the brain and surrounding structures were obtained without and with intravenous contrast. CONTRAST:  7.42mL GADAVIST GADOBUTROL 1 MMOL/ML IV SOLN COMPARISON:  CT 2 days ago. Multiple previous CNS imaging exams since late February of this year. FINDINGS: Brain: Diffusion imaging shows a 7 or 8 mm focus of acute infarction within the deep white matter adjacent to the atrium of the right lateral ventricle. Two adjacent punctate acute cortical infarctions at the left posterior frontal vertex. Possible punctate acute deep white matter infarction of the corona radiography region the left. Few punctate acute infarctions at the superior cerebellar vermis. No large vessel territory infarction. Atrophy of the brainstem with old small vessel insults. Small focus of old hemorrhage in the right cerebellar hemisphere. Cerebral hemispheres show generalized atrophy. Resolving changes of recent intraparenchymal hemorrhage in the left thalamus. Scattered foci hemosiderin deposition within the brain consistent with old brain hemorrhages. Findings raise the possibility of amyloid angiopathy. There is moderate ventriculomegaly  as seen previously. Some residual blood products or layering dependently in the  occipital horns of the lateral ventricles. Ventricular size does not appear increased over baseline. Chronic small-vessel ischemic changes are seen in a widespread fashion throughout the cerebral hemispheric white matter. After contrast administration, there is no evidence of abnormal parenchymal or leptomeningeal enhancement to suggest intracranial infection by imaging. Vascular: Major vessels at the base of the brain show flow. Skull and upper cervical spine: Negative Sinuses/Orbits: Paranasal sinuses are clear.  Orbits are negative. Other: Bilateral mastoid effusions are present. IMPRESSION: 1. Resolving changes of recent intraparenchymal hemorrhage in the left thalamus. Scattered foci of hemosiderin deposition throughout the brain consistent with old brain hemorrhages. Pattern suggests amyloid angiopathy. 2. 7 or 8 mm focus of acute infarction within the deep white matter adjacent to the atrium of the right lateral ventricle. Two adjacent punctate acute cortical infarctions at the left posterior frontal vertex. Possible punctate acute deep white matter infarction of the corona radiography region the left corona radiography region. Adjacent punctate acute infarctions at the superior cerebellar vermis. This pattern of acute infarctions could be due to micro embolic disease from the heart or ascending aorta or could conceivably be secondary to vascular spasm induced by the previous hemorrhage. 3. No evidence of abnormal parenchymal or leptomeningeal enhancement to suggest intracranial infection by imaging. 4. Moderate ventriculomegaly as seen previously. Some residual blood products or layering dependently in the occipital horns of the lateral ventricles. Ventricular size does not appear increased over baseline. Electronically Signed   By: Paulina Fusi M.D.   On: 06/02/2020 16:19   DG CHEST PORT 1 VIEW  Result Date: 05/31/2020 CLINICAL DATA:  Fever, altered mental status EXAM: PORTABLE CHEST 1 VIEW COMPARISON:   05/27/2020 FINDINGS: Feeding tube traverses esophagus into stomach. 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 Atherosclerotic calcification aorta. Mediastinal contours and pulmonary vascularity normal. Persistent infiltrate versus atelectasis at LEFT base. Remaining lungs clear. No pleural effusion or pneumothorax. Skin fold projects over LEFT upper chest. Old BILATERAL rib fractures. IMPRESSION: Persistent LEFT basilar opacity which could represent atelectasis or infiltrate. Electronically Signed   By: Ulyses Southward M.D.   On: 05/31/2020 08:44   EEG Result Date: 05/14/2020  IMPRESSION: This study is suggestive of moderate to evere diffuse encephalopathy, nonspecific etiology. No seizures or epileptiform discharges were seen throughout the recording.  PHYSICAL EXAM Temp:  [97.4 F (36.3 C)-99 F (37.2 C)] 98.4 F (36.9 C) (03/19 1300) Pulse Rate:  [76-102] 76 (03/19 1300) Resp:  [18-44] 40 (03/19 1300) BP: (125-143)/(77-96) 143/86 (03/19 1300) SpO2:  [94 %-100 %] 94 % (03/19 1140) Weight:  [102.5 kg] 102.5 kg (03/19 0500)  General - Well nourished, well developed elderly male, not in acute distress but drowsy lethargic  Ophthalmologic - fundi not visualized due to noncooperation.  Cardiovascular - irregularly irregular heart rate and rhythm.  Neuro - lethargic and drowsy, eyes half way open, no language output, able to follow simple commands at midline and right UE. Eyes in mid position, with attempted tracking, bilateral horizontal gaze incomplete, blinking to visual threat on the right more consistent but on the left inconsistent. Right facial mild droopy. Tongue protrusion incomplete but midline. On pain stimulation, mild withdraw in all extremities, seems equal, but no spontaneous movement or movement at request. Sensation, coordination not cooperative and gait not tested.  ASSESSMENT/PLAN [name] is a 70 y.o. male with history of HTN, DM, CAD/MI, afib on eliquis  presenting with AMS, garbled speech, N/V  and behavior change.   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 Including filling both the third ventricle with layering blood in the fourth and lateral ventricle  Repeat CT head 05/21/2020-done for concern for rightward gaze-with no new changes. CTA head and neck with no LVO. Thalamic ICH is resolving and blood seen layered in the posterior horns of lateral ventricles.  2D Echo EF 45-50%, No shunt  LDL 84  HgbA1c 9.4  VTE prophylaxis - SCDs, Lovenox 40mg  daily  Eliquis (apixaban) daily prior to admission, now on No antithrombotic  Therapy recommendations:  SNF  Disposition:  pending medical readiness  Palliative care consult is appreciated but family wants full support   his facility staff reported his baseline functioning was very slow to speak, able to independently transfer to chair.   IVH  S/p EVD  2/27 Neurosurgery instilled tPA into EVD  3/1 EVD weaned to 20 cm H20, ICPs stable  3/2 failed clamping trial  3/5 EVD accidentally dislodged. Reintubated.   CT head repeat 3/6 stable, repeat at night due to concern for gaze deviation also stable.   3/7NSG signed off.   Extubated 3/8. EEG showed no seizure  Hypertensive emergency  Home meds:  Lotensin, metoprolol  Stable on the low end  On Benazepril 40mg  daily, metoprolol 50mg  bid, cardizem 60 Q6 . Long-term BP goal normotensive  Afib/Aflutter on [facility] with HF   On eliquis PTA  Reversed by Kcentra  No antithrombotics/anticoagulants due to ICH  Continue po cardizem and metoprolol for rate  control  Cardiology consult appreciated: Rate control adequate.   Continue tele monitoring   EF 45%   Seizure like activity  No recurrence   EEG with no evidence of seizures.  Severe diffuse encephalopathy of nonspecific etiology.  Continue Keppra 750 twice daily   Respiratory failure and LLL Pneumonia/sinusitis  Extubated 3/1, vomited post extubation  Tachypnea re-intubated 3/5 and extubated 3/8  CCM on board and appreciated  Empiric antibiotics-completed 5 days of Zosyn.   WBC 8.2  (stable in last 4 days)  Tmax 100.2->afebrile  Blood cultures 3/4 neg  Fungal infection  Blood culture + candida glabrata  ID concerning source from decubitus ulcer  mri brain no leptomeningeal enhancement  On fluconazole -> anidulafungin  Dysphagia  Likely due to hemorrhagic stroke  Failed swallow eval  On TF @ 30  Speech on board  Waxing and waning mentation has been prohibitive for safe swallowing  High aspiration risk   Attempting to reach family has been unsuccessful. Left  voicemail with son today.   SLP unable to assess today due to somnolence.  Hyperlipidemia  Home meds:  crestor 20  LDL 84, goal < 70  May continue statin at discharge  Diabetes type II Uncontrolled with hyperglycemia and episodes of hpoglycemia.  Home meds:  insulin  HgbA1c 9.4, goal < 7.0  SSI  Appreciate CCM assistance in management  Other Stroke Risk Factors  Advanced Age >/= 20   Hx stroke/TIA  Hospital day #23  Neurology has nothing to add at this time. will sign off. Please call with questions. Pt will follow up with stroke clinic  [facility] weeks after [facility].   Marvel PlanJindong Zhara Gieske, MD PhD Stroke Neurology 06/03/2020 11:28 PM

## 2020-06-03 NOTE — Progress Notes (Signed)
PROGRESS NOTE    Jason [name]   QIH:[id number]  DOB: [birth year]  PCP: Jodi Marble, MD    DOA: 05/11/2020 LOS: 47   Brief Narrative   70 year old M with PMH of CAD, DM-2, PAF on Eliquis, CVA, HTN and systolic CHF admitted to ICU on 05/11/2020 with >1.2cm thalamic hemorrhage with intraventricular extension in the setting of hypertensive emergency.  He received Kcentra for Eliquis reversal.  He was intubated and had external ventricular drain placed on 05/12/2020.  He was extubated on 05/16/2020.  EVD dislodged on 05/20/2020.  He was reintubated on 3/5, and eventually extubated to BiPAP on 3/8.  He was transferred to [facility] service on 3/12, Clinical course since then, patient continues to be persistently encephalopathic, in semi-vegetative state.  Palliative care consulted.  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 Fib -Received KCentra for Eliquis reversal in ED -Followed by neurosurgery and neurology, external ventricular drain from 2/25-3/5.  -EEG with generalized slowing consistent with moderate to severe diffuse encephalopathy, continued on Keppra for seizure prophylaxis -Continue tube feeds for now, with some improvement in mentation, started dysphagia 1 diet, no plans for PEG tube at this time -Decrease tube feeds  Candida glabrata fungemia -Febrile 3/16, blood cx+ for Candida glabrata -Appreciate infectious disease input now on Eraxis and fluconazole, discussed with neurology highly unlikely to have fungal meningitis -MRI  brain without LM enhancement to suggest meningitis, decubitus wounds could be source of fungemia -Continue Eraxis -If patient continues to make clinical progress will ask ophthalmology to evaluate on Monday to exclude fungal endophthalmitis -Follow-up repeat blood cultures from 3/18  Acute respiratory failure with hypoxia  -Secondary to hydrocephalus and intracranial hemorrhage, - intubation and mechanical ventilation from 2/25-3/1, reintubated 3/5-3/8 --More stable now, monitor  Persistent A. fib/A flutter: - Rate has been controlled mostly in 60's. Was on Eliquis POA. -Eliquis discontinued in the setting of intracranial hemorrhage -Blood pressure stabilized, continue Cardizem and metoprolol  Chronic systolic CHF:  - LVEF 45 to 50%.  - no overt fluid overload. -Monitor off diuretics at this time  Hypernatremia:  -Improving, continue free water flushes  Hypertensive emergency/uncontrolled hypertension:  BP 205/120 on admit.   Hypotensive yesterday, now normotensive, restarted Cardizem and metoprolol  History of CAD: Stable -Cardiac meds as above.  Uncontrolled IDDM-2: A1c 9.4% -Blood sugars trending up again, restart Lantus, continue sliding scale  Pressure skin injury: Stage II Pressure Injury 05/24/20 Buttocks Bilateral;Medial Deep Tissue Pressure Injury - Purple or maroon localized area of discolored intact skin or blood-filled blister due to damage of underlying soft tissue from pressure and/or shear. Multiple blisters along (Active)  05/24/20 0800  Location: Buttocks  Location Orientation: Bilateral;Medial  Staging: Deep Tissue Pressure Injury - Purple or maroon localized area of discolored intact skin or blood-filled blister due to damage of underlying soft tissue from pressure and/or shear.  Wound Description (Comments): Multiple blisters along with deep tissue injury.  Present on Admission:     Obesity: Body mass index is 32.43 kg/m.  Complicates overall  care and prognosis.  DVT prophylaxis: Lovenox   CODE STATUS: DNR  Subjective    -  Without considerable changes from yesterday, eyes are open, partly obtunded   Disposition Plan & Communication   Status is: Inpatient  Inpatient status is appropriate due to severity of illness  Dispo: The patient is from: Home              Anticipated d/c is to: SNF              Patient currently is not medically stable for d/c   Difficult to place patient - No   Consults, Procedures, Significant Events   Consultants:  Neurosurgery Intensivist Cardiology Neurology Palliative medicine  Procedures:  ETT 2/25 >> 3/1, 3/5 >> 3/8 EVD 2/25 >> 3/5  Antimicrobials:  Anti-infectives (From admission, onward)   Start     Dose/Rate Route Frequency Ordered Stop   06/03/20 1300  anidulafungin (ERAXIS) 100 mg in sodium chloride 0.9 % 100 mL IVPB       "Followed by" Linked Group Details   100 mg 78 mL/hr over 100 Minutes Intravenous Every 24 hours 06/02/20 1119     06/02/20 1600  fluconazole (DIFLUCAN) IVPB 400 mg  Status:  Discontinued        400 mg 100 mL/hr over 120 Minutes Intravenous Every 24 hours 06/02/20 1119 06/02/20 1712   06/02/20 1300  anidulafungin (ERAXIS) 200 mg in sodium chloride 0.9 % 200 mL IVPB       "Followed by" Linked Group Details   200 mg 78 mL/hr over 200 Minutes Intravenous  Once 06/02/20 1119 06/02/20 2142   06/02/20 1200  anidulafungin (ERAXIS) 100 mg in sodium chloride 0.9 % 100 mL IVPB  Status:  Discontinued        100 mg 78 mL/hr over 100 Minutes Intravenous Every 24 hours 06/01/20 1230 06/01/20 1249   06/02/20 1000  voriconazole (VFEND) tablet 200 mg  Status:  Discontinued        200 mg Oral Every 12 hours 06/01/20 1347 06/01/20 1436   06/02/20 1000  voriconazole (VFEND) tablet 200 mg  Status:  Discontinued        200 mg Per Tube Every 12 hours 06/01/20 1436 06/02/20 1119   06/01/20 1445  voriconazole (VFEND) tablet 400 mg        400 mg Per Tube Every 12 hours  06/01/20 1436 06/01/20 2145   06/01/20 1430  voriconazole (VFEND) tablet 400 mg  Status:  Discontinued        400 mg Oral Every 12 hours 06/01/20 1347 06/01/20 1436   06/01/20 1330  anidulafungin (ERAXIS) 200 mg in sodium chloride 0.9 % 200 mL IVPB  Status:  Discontinued        200 mg 78 mL/hr over 200 Minutes Intravenous  Once 06/01/20 1230 06/01/20 1249   06/01/20 1300  vancomycin (VANCOREADY) IVPB 1000 mg/200 mL  Status:  Discontinued        1,000 mg 200 mL/hr over 60 Minutes Intravenous Every 24 hours 05/31/20 1323 06/01/20 0928   05/31/20 1900  piperacillin-tazobactam (ZOSYN) IVPB 3.375 g  Status:  Discontinued        3.375 g 12.5 mL/hr over 240 Minutes Intravenous Every 8 hours 05/31/20 1012 06/02/20 1346   05/31/20 1100  vancomycin (VANCOREADY) IVPB 2000 mg/400 mL        2,000 mg 200 mL/hr over 120 Minutes Intravenous  Once 05/31/20 1012 05/31/20 1634   05/31/20 1100  piperacillin-tazobactam (ZOSYN) IVPB 3.375 g        3.375 g 100 mL/hr over 30 Minutes Intravenous  Once 05/31/20  1012 05/31/20 1406   05/21/20 1100  vancomycin (VANCOREADY) IVPB 1750 mg/350 mL  Status:  Discontinued        1,750 mg 175 mL/hr over 120 Minutes Intravenous Every 24 hours 05/20/20 0953 05/23/20 1046   05/20/20 1045  vancomycin (VANCOREADY) IVPB 2000 mg/400 mL        2,000 mg 200 mL/hr over 120 Minutes Intravenous  Once 05/20/20 0948 05/20/20 1341   05/19/20 1015  cefTRIAXone (ROCEPHIN) 2 g in sodium chloride 0.9 % 100 mL IVPB  Status:  Discontinued        2 g 200 mL/hr over 30 Minutes Intravenous Every 24 hours 05/18/20 0958 05/18/20 1132   05/18/20 1400  piperacillin-tazobactam (ZOSYN) IVPB 3.375 g  Status:  Discontinued        3.375 g 12.5 mL/hr over 240 Minutes Intravenous Every 8 hours 05/18/20 1132 05/23/20 1046   05/17/20 1015  cefTRIAXone (ROCEPHIN) 1 g in sodium chloride 0.9 % 100 mL IVPB  Status:  Discontinued        1 g 200 mL/hr over 30 Minutes Intravenous Every 24 hours 05/17/20 0918  05/18/20 0958   05/12/20 1530  ceFAZolin (ANCEF) IVPB 1 g/50 mL premix  Status:  Discontinued        1 g 100 mL/hr over 30 Minutes Intravenous Every 8 hours 05/12/20 1440 05/17/20 0918        Micro    Objective   Vitals:   06/03/20 0500 06/03/20 0811 06/03/20 1140 06/03/20 1300  BP:  (!) 136/96 140/79 (!) 143/86  Pulse:  87 87 76  Resp:  18 (!) 21 (!) 40  Temp:  98.2 F (36.8 C) 98.4 F (36.9 C) 98.4 F (36.9 C)  TempSrc:  Oral Oral Axillary  SpO2:  97% 94%   Weight: 102.5 kg     Height:        Intake/Output Summary (Last 24 hours) at 06/03/2020 1315 Last data filed at 06/03/2020 0400 Gross per 24 hour  Intake 300 ml  Output 1050 ml  Net -750 ml   Filed Weights   06/01/20 0404 06/02/20 0500 06/03/20 0500  Weight: 107 kg 104.3 kg 102.5 kg    Physical Exam: Chronically ill elderly male laying in bed, opens eyes, tracking, intermittently follows some commands HEENT: Peoples equal and reactive CVS: S1-S2, regular rate rhythm Lungs: Scattered rhonchi Abdomen: Soft, nontender, bowel sounds present Extremities: No edema, offloading boots on  Neuro: No responsive, opens eyes, follows some commands Labs   Data Reviewed: I have personally reviewed following labs and imaging studies  CBC: Recent Labs  Lab 05/28/20 0404 05/31/20 0331 06/01/20 0435 06/02/20 0404 06/03/20 0117  WBC 10.6* 9.9 7.3 4.9 6.4  HGB 14.7 14.0 14.0 12.5* 12.3*  HCT 47.7 44.8 44.0 39.9 39.2  MCV 90.7 90.0 88.9 89.1 88.7  PLT 263 219 108* 87* 79*   Basic Metabolic Panel: Recent Labs  Lab 05/28/20 0404 05/31/20 0331 06/01/20 0435 06/02/20 0404 06/03/20 0117  NA 150* 149* 144 146* 146*  K 4.0 3.8 4.2 3.8 3.8  CL 114* 112* 112* 113* 116*  CO2 _0 GLUCOSE 62* 78 128* 216* 224*  BUN 27* 42* 51* 55* 56*  CREATININE 0.88 1.49* 2.06* 2.16* 2.22*  CALCIUM 8.3* 8.1* 7.5* 7.5* 7.7*  MG 2.6* 2.8*  --   --   --   PHOS 4.9*  --   --   --   --    GFR: Estimated Creatinine  Clearance: 36.6 mL/min (A) (by C-G formula based on SCr of 2.22 mg/dL (H)). Liver Function Tests: Recent Labs  Lab 05/28/20 0404 05/31/20 0331  AST 42* 35  ALT 67* 46*  ALKPHOS 68 66  BILITOT 0.5 0.6  PROT 6.7 6.4*  ALBUMIN 2.1* 2.0*   No results for input(s): LIPASE, AMYLASE in the last 168 hours. No results for input(s): AMMONIA in the last 168 hours. Coagulation Profile: No results for input(s): INR, PROTIME in the last 168 hours. Cardiac Enzymes: No results for input(s): CKTOTAL, CKMB, CKMBINDEX, TROPONINI in the last 168 hours. BNP (last 3 results) No results for input(s): PROBNP in the last 8760 hours. HbA1C: No results for input(s): HGBA1C in the last 72 hours. CBG: Recent Labs  Lab 06/02/20 2005 06/02/20 2357 06/03/20 0357 06/03/20 0816 06/03/20 1145  GLUCAP 220* 213* 185* 205* 235*   Lipid Profile: No results for input(s): CHOL, HDL, LDLCALC, TRIG, CHOLHDL, LDLDIRECT in the last 72 hours. Thyroid Function Tests: No results for input(s): TSH, T4TOTAL, FREET4, T3FREE, THYROIDAB in the last 72 hours. Anemia Panel: No results for input(s): VITAMINB12, FOLATE, FERRITIN, TIBC, IRON, RETICCTPCT in the last 72 hours. Sepsis Labs: Recent Labs  Lab 05/31/20 0933 05/31/20 1227 05/31/20 1534 06/01/20 0435 06/02/20 0404  PROCALCITON  --   --  2.35 3.50 2.52  LATICACIDVEN 2.0* 2.1*  --   --   --     Recent Results (from the past 240 hour(s))  Culture, blood (routine x 2)     Status: Abnormal (Preliminary result)   Collection Time: 05/31/20  9:28 AM   Specimen: BLOOD  Result Value Ref Range Status   Specimen Description BLOOD LEFT ANTECUBITAL  Final   Special Requests   Final    BOTTLES DRAWN AEROBIC AND ANAEROBIC Blood Culture results may not be optimal due to an inadequate volume of blood received in culture bottles   Culture  Setup Time (A)  Final    YEAST IN BOTH AEROBIC AND ANAEROBIC BOTTLES CRITICAL RESULT CALLED TO, READ BACK BY AND VERIFIED WITH: Linna Darner [id number] FCP Performed at [facility], [place] [address] [place], [place] [address]    Culture CANDIDA GLABRATA (A)  Final   Report Status PENDING  Incomplete  Blood Culture ID Panel (Reflexed)     Status: Abnormal   Collection Time: 05/31/20  9:28 AM  Result Value Ref Range Status   Enterococcus faecalis NOT DETECTED NOT DETECTED Final   Enterococcus Faecium NOT DETECTED NOT DETECTED Final   Listeria monocytogenes NOT DETECTED NOT DETECTED Final   Staphylococcus species NOT DETECTED NOT DETECTED Final   Staphylococcus aureus (BCID) NOT DETECTED NOT DETECTED Final   Staphylococcus epidermidis NOT DETECTED NOT DETECTED Final   Staphylococcus lugdunensis NOT DETECTED NOT DETECTED Final   Streptococcus species NOT DETECTED NOT DETECTED Final   Streptococcus agalactiae NOT DETECTED NOT DETECTED Final   Streptococcus pneumoniae NOT DETECTED NOT DETECTED Final   Streptococcus pyogenes NOT DETECTED NOT DETECTED Final   A.calcoaceticus-baumannii NOT DETECTED NOT DETECTED Final   Bacteroides fragilis NOT DETECTED NOT DETECTED Final   Enterobacterales NOT DETECTED NOT DETECTED Final   Enterobacter cloacae complex NOT DETECTED NOT DETECTED Final   Escherichia coli NOT DETECTED NOT DETECTED Final   Klebsiella aerogenes NOT DETECTED NOT DETECTED Final   Klebsiella oxytoca NOT DETECTED NOT DETECTED Final   Klebsiella pneumoniae NOT DETECTED NOT DETECTED Final   Proteus species NOT DETECTED NOT DETECTED Final   Salmonella species NOT  DETECTED NOT DETECTED Final   Serratia marcescens NOT DETECTED NOT DETECTED Final   Haemophilus influenzae NOT DETECTED NOT DETECTED Final   Neisseria meningitidis NOT DETECTED NOT DETECTED Final   Pseudomonas aeruginosa NOT DETECTED NOT DETECTED Final   Stenotrophomonas maltophilia NOT DETECTED NOT DETECTED Final   Candida albicans NOT DETECTED NOT DETECTED Final   Candida auris NOT DETECTED NOT DETECTED Final   Candida glabrata DETECTED (A) NOT  DETECTED Final    Comment: CRITICAL RESULT CALLED TO, READ BACK BY AND VERIFIED WITH: PHARMD THOMAS J. [id number] FCP    Candida krusei NOT DETECTED NOT DETECTED Final   Candida parapsilosis NOT DETECTED NOT DETECTED Final   Candida tropicalis NOT DETECTED NOT DETECTED Final   Cryptococcus neoformans/gattii NOT DETECTED NOT DETECTED Final    Comment: Performed at [facility], [place] [address] [place], [place] [address]  Culture, blood (routine x 2)     Status: Abnormal (Preliminary result)   Collection Time: 05/31/20  9:36 AM   Specimen: BLOOD LEFT HAND  Result Value Ref Range Status   Specimen Description BLOOD LEFT HAND  Final   Special Requests   Final    BOTTLES DRAWN AEROBIC AND ANAEROBIC Blood Culture results may not be optimal due to an inadequate volume of blood received in culture bottles   Culture  Setup Time   Final    YEAST IN BOTH AEROBIC AND ANAEROBIC BOTTLES CRITICAL VALUE NOTED.  VALUE IS CONSISTENT WITH PREVIOUSLY REPORTED AND CALLED VALUE. Performed at [facility], [place] [address] [place], [place] [address]    Culture CANDIDA GLABRATA (A)  Final   Report Status PENDING  Incomplete  MRSA PCR Screening     Status: None   Collection Time: 05/31/20  3:52 PM   Specimen: Nasal Mucosa; Nasopharyngeal  Result Value Ref Range Status   MRSA by PCR NEGATIVE NEGATIVE Final    Comment:        The GeneXpert MRSA Assay (FDA approved for NASAL specimens only), is one component of a comprehensive MRSA colonization surveillance program. It is not intended to diagnose MRSA infection nor to guide or monitor treatment for MRSA infections. Performed at [facility], [place] [address], [place], [place] [address]   Culture, blood (Routine X 2) w Reflex to ID Panel     Status: None (Preliminary result)   Collection Time: 06/02/20 10:08 AM   Specimen: BLOOD  Result Value Ref Range Status   Specimen Description BLOOD LEFT ANTECUBITAL  Final   Special Requests    Final    BOTTLES DRAWN AEROBIC ONLY Blood Culture adequate volume   Culture   Final    NO GROWTH 1 DAY Performed at [facility], [place] [address], [place], [place] [address]    Report Status PENDING  Incomplete  Culture, blood (Routine X 2) w Reflex to ID Panel     Status: None (Preliminary result)   Collection Time: 06/02/20 10:08 AM   Specimen: BLOOD LEFT HAND  Result Value Ref Range Status   Specimen Description BLOOD LEFT HAND  Final   Special Requests   Final    BOTTLES DRAWN AEROBIC ONLY Blood Culture adequate volume   Culture   Final    NO GROWTH 1 DAY Performed at [facility], [place] [address] [place], [place] [address]    Report Status PENDING  Incomplete      Imaging Studies   MR BRAIN W WO CONTRAST  Result Date: 06/02/2020 CLINICAL DATA:  Thalamic hemorrhage with intraventricular extension. Meningitis/infection suspected. EXAM: MRI HEAD WITHOUT AND WITH CONTRAST TECHNIQUE: Multiplanar, multiecho pulse sequences of the brain and surrounding structures were obtained without and with intravenous contrast. CONTRAST:  7.6m GADAVIST GADOBUTROL 1 MMOL/ML IV SOLN COMPARISON:  CT 2 days ago. Multiple previous CNS imaging exams since late February of this year. FINDINGS: Brain: Diffusion imaging shows a 7 or 8 mm focus of acute infarction within the deep white matter adjacent to the atrium of the right lateral ventricle. Two adjacent punctate acute cortical infarctions at the left posterior frontal vertex. Possible punctate acute deep white matter infarction of the corona radiography region the left. Few punctate acute infarctions at the superior cerebellar vermis. No large vessel territory infarction. Atrophy of the brainstem with old small vessel insults. Small focus of old hemorrhage in the right cerebellar hemisphere. Cerebral hemispheres show generalized atrophy. Resolving changes of recent intraparenchymal hemorrhage in the left thalamus. Scattered foci hemosiderin  deposition within the brain consistent with old brain hemorrhages. Findings raise the possibility of amyloid angiopathy. There is moderate ventriculomegaly as seen previously. Some residual blood products or layering dependently in the occipital horns of the lateral ventricles. Ventricular size does not appear increased over baseline. Chronic small-vessel ischemic changes are seen in a widespread fashion throughout the cerebral hemispheric white matter. After contrast administration, there is no evidence of abnormal parenchymal or leptomeningeal enhancement to suggest intracranial infection by imaging. Vascular: Major vessels at the base of the brain show flow. Skull and upper cervical spine: Negative Sinuses/Orbits: Paranasal sinuses are clear.  Orbits are negative. Other: Bilateral mastoid effusions are present. IMPRESSION: 1. Resolving changes of recent intraparenchymal hemorrhage in the left thalamus. Scattered foci of hemosiderin deposition throughout the brain consistent with old brain hemorrhages. Pattern suggests amyloid angiopathy. 2. 7 or 8 mm focus of acute infarction within the deep white matter adjacent to the atrium of the right lateral ventricle. Two adjacent punctate acute cortical infarctions at the left posterior frontal vertex. Possible punctate acute deep white matter infarction of the corona radiography region the left corona radiography region. Adjacent punctate acute infarctions at the superior cerebellar vermis. This pattern of acute infarctions could be due to micro embolic disease from the heart or ascending aorta or could conceivably be secondary to vascular spasm induced by the previous hemorrhage. 3. No evidence of abnormal parenchymal or leptomeningeal enhancement to suggest intracranial infection by imaging. 4. Moderate ventriculomegaly as seen previously. Some residual blood products or layering dependently in the occipital horns of the lateral ventricles. Ventricular size does not  appear increased over baseline. Electronically Signed   By: MNelson ChimesM.D.   On: 06/02/2020 16:19     Medications   Scheduled Meds: . amantadine  100 mg Per Tube BID  . chlorhexidine gluconate (MEDLINE KIT)  15 mL Mouth Rinse BID  . diltiazem  30 mg Per Tube Q8H  . feeding supplement (PROSource TF)  45 mL Per Tube TID  . free water  200 mL Per Tube Q4H  . insulin aspart  0-20 Units Subcutaneous Q4H  . insulin glargine  10 Units Subcutaneous Daily  . levETIRAcetam  750 mg Per Tube BID  . mouth rinse  15 mL Mouth Rinse q12n4p  . metoprolol tartrate  25 mg Per Tube BID  . pantoprazole sodium  40 mg Per Tube Daily  . thiamine  100 mg Per Tube Daily   Continuous Infusions: . sodium chloride    .  anidulafungin 100 mg (06/03/20 1257)  . feeding supplement (JEVITY 1.5 CAL/FIBER) 1,000 mL (06/03/20 0406)     LOS: 23 days    Time spent: 25 minutes   Domenic Polite, MD [facility]  06/03/2020, 1:15 PM

## 2020-06-03 NOTE — Plan of Care (Signed)
  Problem: Coping: Goal: Will verbalize positive feelings about self Outcome: Progressing   Problem: Self-Care: Goal: Ability to communicate needs accurately will improve Outcome: Progressing   Problem: Nutrition: Goal: Risk of aspiration will decrease Outcome: Progressing   Problem: Pain Managment: Goal: General experience of comfort will improve Outcome: Progressing

## 2020-06-03 NOTE — Progress Notes (Addendum)
Subjective: Afebrile; last fever 3/16 No leukocytosis Remains on anidulafungin Discussed with nursing about decub/mentation; mentation not worse, overall improving 3/18 bcx negative  Antibiotics:  Anti-infectives (From admission, onward)   Start     Dose/Rate Route Frequency Ordered Stop   06/03/20 1300  anidulafungin (ERAXIS) 100 mg in sodium chloride 0.9 % 100 mL IVPB       "Followed by" Linked Group Details   100 mg 78 mL/hr over 100 Minutes Intravenous Every 24 hours 06/02/20 1119     06/02/20 1600  fluconazole (DIFLUCAN) IVPB 400 mg  Status:  Discontinued        400 mg 100 mL/hr over 120 Minutes Intravenous Every 24 hours 06/02/20 1119 06/02/20 1712   06/02/20 1300  anidulafungin (ERAXIS) 200 mg in sodium chloride 0.9 % 200 mL IVPB       "Followed by" Linked Group Details   200 mg 78 mL/hr over 200 Minutes Intravenous  Once 06/02/20 1119 06/02/20 2142   06/02/20 1200  anidulafungin (ERAXIS) 100 mg in sodium chloride 0.9 % 100 mL IVPB  Status:  Discontinued        100 mg 78 mL/hr over 100 Minutes Intravenous Every 24 hours 06/01/20 1230 06/01/20 1249   06/02/20 1000  voriconazole (VFEND) tablet 200 mg  Status:  Discontinued        200 mg Oral Every 12 hours 06/01/20 1347 06/01/20 1436   06/02/20 1000  voriconazole (VFEND) tablet 200 mg  Status:  Discontinued        200 mg Per Tube Every 12 hours 06/01/20 1436 06/02/20 1119   06/01/20 1445  voriconazole (VFEND) tablet 400 mg        400 mg Per Tube Every 12 hours 06/01/20 1436 06/01/20 2145   06/01/20 1430  voriconazole (VFEND) tablet 400 mg  Status:  Discontinued        400 mg Oral Every 12 hours 06/01/20 1347 06/01/20 1436   06/01/20 1330  anidulafungin (ERAXIS) 200 mg in sodium chloride 0.9 % 200 mL IVPB  Status:  Discontinued        200 mg 78 mL/hr over 200 Minutes Intravenous  Once 06/01/20 1230 06/01/20 1249   06/01/20 1300  vancomycin (VANCOREADY) IVPB 1000 mg/200 mL  Status:  Discontinued        1,000  mg 200 mL/hr over 60 Minutes Intravenous Every 24 hours 05/31/20 1323 06/01/20 0928   05/31/20 1900  piperacillin-tazobactam (ZOSYN) IVPB 3.375 g  Status:  Discontinued        3.375 g 12.5 mL/hr over 240 Minutes Intravenous Every 8 hours 05/31/20 1012 06/02/20 1346   05/31/20 1100  vancomycin (VANCOREADY) IVPB 2000 mg/400 mL        2,000 mg 200 mL/hr over 120 Minutes Intravenous  Once 05/31/20 1012 05/31/20 1634   05/31/20 1100  piperacillin-tazobactam (ZOSYN) IVPB 3.375 g        3.375 g 100 mL/hr over 30 Minutes Intravenous  Once 05/31/20 1012 05/31/20 1406   05/21/20 1100  vancomycin (VANCOREADY) IVPB 1750 mg/350 mL  Status:  Discontinued        1,750 mg 175 mL/hr over 120 Minutes Intravenous Every 24 hours 05/20/20 0953 05/23/20 1046   05/20/20 1045  vancomycin (VANCOREADY) IVPB 2000 mg/400 mL        2,000 mg 200 mL/hr over 120 Minutes Intravenous  Once 05/20/20 0948 05/20/20 1341   05/19/20 1015  cefTRIAXone (ROCEPHIN) 2 g in sodium chloride 0.9 %  100 mL IVPB  Status:  Discontinued        2 g 200 mL/hr over 30 Minutes Intravenous Every 24 hours 05/18/20 0958 05/18/20 1132   05/18/20 1400  piperacillin-tazobactam (ZOSYN) IVPB 3.375 g  Status:  Discontinued        3.375 g 12.5 mL/hr over 240 Minutes Intravenous Every 8 hours 05/18/20 1132 05/23/20 1046   05/17/20 1015  cefTRIAXone (ROCEPHIN) 1 g in sodium chloride 0.9 % 100 mL IVPB  Status:  Discontinued        1 g 200 mL/hr over 30 Minutes Intravenous Every 24 hours 05/17/20 0918 05/18/20 0958   05/12/20 1530  ceFAZolin (ANCEF) IVPB 1 g/50 mL premix  Status:  Discontinued        1 g 100 mL/hr over 30 Minutes Intravenous Every 8 hours 05/12/20 1440 05/17/20 0918      Medications: Scheduled Meds: . amantadine  100 mg Per Tube BID  . chlorhexidine gluconate (MEDLINE KIT)  15 mL Mouth Rinse BID  . diltiazem  30 mg Per Tube Q8H  . feeding supplement (PROSource TF)  45 mL Per Tube TID  . free water  200 mL Per Tube Q4H  . insulin  aspart  0-20 Units Subcutaneous Q4H  . insulin glargine  10 Units Subcutaneous Daily  . levETIRAcetam  750 mg Per Tube BID  . mouth rinse  15 mL Mouth Rinse q12n4p  . metoprolol tartrate  25 mg Per Tube BID  . pantoprazole sodium  40 mg Per Tube Daily  . thiamine  100 mg Per Tube Daily   Continuous Infusions: . sodium chloride    . anidulafungin 100 mg (06/03/20 1257)  . feeding supplement (JEVITY 1.5 CAL/FIBER) 1,000 mL (06/03/20 0406)   PRN Meds:.acetaminophen **OR** acetaminophen (TYLENOL) oral liquid 160 mg/5 mL **OR** acetaminophen, Gerhardt's butt cream, morphine injection, ondansetron (ZOFRAN) IV, polyethylene glycol, Resource ThickenUp Clear, senna-docusate    Objective: Weight change: -1.814 kg  Intake/Output Summary (Last 24 hours) at 06/03/2020 1330 Last data filed at 06/03/2020 0400 Gross per 24 hour  Intake 300 ml  Output 1050 ml  Net -750 ml   Blood pressure (!) 143/86, pulse 76, temperature 98.4 F (36.9 C), temperature source Axillary, resp. rate (!) 40, height _0  (1.778 m), weight 102.5 kg, SpO2 94 %. Temp:  [97.4 F (36.3 C)-99 F (37.2 C)] 98.4 F (36.9 C) (03/19 1300) Pulse Rate:  [76-102] 76 (03/19 1300) Resp:  [18-44] 40 (03/19 1300) BP: (125-143)/(77-96) 143/86 (03/19 1300) SpO2:  [94 %-100 %] 94 % (03/19 1140) Weight:  [102.5 kg] 102.5 kg (03/19 0500)  Physical Exam: Physical Exam HENT:     Head: Normocephalic.  Eyes:     Extraocular Movements: Extraocular movements intact.  Cardiovascular:     Rate and Rhythm: Normal rate.     Heart sounds: No murmur heard. No friction rub. No gallop.   Pulmonary:     Effort: Pulmonary effort is normal. No respiratory distress.     Breath sounds: No stridor. No wheezing.  Skin:    General: Skin is warm and dry.     Comments: I discussed with nursing... sacral stage 2-3 coin size  Neurological:     Mental Status: He is alert.     Comments: Patient opened eyes to void and minimal movement of  extremities. I am unable to do full neuro exam due to mentation   He continues to follow commands he named his son he responds to his name  CBC:    BMET Recent Labs    06/02/20 0404 06/03/20 0117  NA 146* 146*  K 3.8 3.8  CL 113* 116*  CO2 24 23  GLUCOSE 216* 224*  BUN 55* 56*  CREATININE 2.16* 2.22*  CALCIUM 7.5* 7.7*     Liver Panel  No results for input(s): PROT, ALBUMIN, AST, ALT, ALKPHOS, BILITOT, BILIDIR, IBILI in the last 72 hours.     Sedimentation Rate No results for input(s): ESRSEDRATE in the last 72 hours. C-Reactive Protein No results for input(s): CRP in the last 72 hours.  Micro Results: Recent Results (from the past 720 hour(s))  Blood Cultures (routine x 2)     Status: None   Collection Time: 05/11/20  7:36 PM   Specimen: Site Not Specified; Blood  Result Value Ref Range Status   Specimen Description   Final    SITE NOT SPECIFIED Performed at [facility] [address], [place], [place] [address]    Special Requests   Final    BOTTLES DRAWN AEROBIC AND ANAEROBIC Blood Culture results may not be optimal due to an inadequate volume of blood received in culture bottles Performed at [facility] [address], [place], [place] [address]    Culture   Final    NO GROWTH 5 DAYS Performed at [facility], [place] [address] [place], [place] [address]    Report Status 05/16/2020 FINAL  Final  Blood Cultures (routine x 2)     Status: None   Collection Time: 05/11/20  7:36 PM   Specimen: BLOOD  Result Value Ref Range Status   Specimen Description   Final    BLOOD LEFT WRIST Performed at [facility] [address] [place], [place] [address]    Special Requests   Final    BOTTLES DRAWN AEROBIC AND ANAEROBIC Blood Culture adequate volume Performed at [facility] [address] [place], [place] [address]    Culture   Final    NO GROWTH 5 DAYS Performed at  [facility], [place] [address], [place], [place] [address]    Report Status 05/16/2020 FINAL  Final  Urine culture     Status: None   Collection Time: 05/11/20  9:00 PM   Specimen: Urine, Random  Result Value Ref Range Status   Specimen Description   Final    URINE, RANDOM Performed at [facility] [address] [place], [place] [address]    Special Requests   Final    NONE Performed at [facility], [place] [address] [place], [place] [address]    Culture   Final    NO GROWTH Performed at [facility], [place] [address] [place], [place] [address]    Report Status 05/13/2020 FINAL  Final  Resp Panel by RT-PCR (Flu A&B, Covid) Nasopharyngeal Swab     Status: None   Collection Time: 05/11/20  9:30 PM   Specimen: Nasopharyngeal Swab; Nasopharyngeal(NP) swabs in vial transport medium  Result Value Ref Range Status   SARS Coronavirus 2 by RT PCR NEGATIVE NEGATIVE Final    Comment: (NOTE) SARS-CoV-2 target nucleic acids are NOT DETECTED.  The SARS-CoV-2 RNA is generally detectable in upper respiratory specimens during the acute phase of infection. The lowest concentration of SARS-CoV-2 viral copies this assay can detect is 138 copies/mL. A negative result does not preclude SARS-Cov-2 infection and should not be used as the sole basis  for treatment or other patient management decisions. A negative result may occur with  improper specimen collection/handling, submission of specimen other than nasopharyngeal swab, presence of viral mutation(s) within the areas targeted by this assay, and inadequate number of viral copies(<138 copies/mL). A negative result must be combined with clinical observations, patient history, and epidemiological information. The expected result is Negative.  Fact Sheet for Patients:  EntrepreneurPulse.com.au  Fact Sheet for Healthcare Providers:  IncredibleEmployment.be  This  test is no t yet approved or cleared by the [place] FDA and  has been authorized for detection and/or diagnosis of SARS-CoV-2 by FDA under an Emergency Use Authorization (EUA). This EUA will remain  in effect (meaning this test can be used) for the duration of the COVID-19 declaration under Section 564(b)(1) of the Act, 21 U.S.C.section 360bbb-3(b)(1), unless the authorization is terminated  or revoked sooner.       Influenza A by PCR NEGATIVE NEGATIVE Final   Influenza B by PCR NEGATIVE NEGATIVE Final    Comment: (NOTE) The Xpert Xpress SARS-CoV-2/FLU/RSV plus assay is intended as an aid in the diagnosis of influenza from Nasopharyngeal swab specimens and should not be used as a sole basis for treatment. Nasal washings and aspirates are unacceptable for Xpert Xpress SARS-CoV-2/FLU/RSV testing.  Fact Sheet for Patients: EntrepreneurPulse.com.au  Fact Sheet for Healthcare Providers: IncredibleEmployment.be  This test is not yet approved or cleared by the [place] FDA and has been authorized for detection and/or diagnosis of SARS-CoV-2 by FDA under an Emergency Use Authorization (EUA). This EUA will remain in effect (meaning this test can be used) for the duration of the COVID-19 declaration under Section 564(b)(1) of the Act, 21 U.S.C. section 360bbb-3(b)(1), unless the authorization is terminated or revoked.  Performed at [facility], [place] [address], [place], [place] [address]   MRSA PCR Screening     Status: None   Collection Time: 05/12/20 12:26 AM   Specimen: Nasopharyngeal Swab  Result Value Ref Range Status   MRSA by PCR NEGATIVE NEGATIVE Final    Comment:        The GeneXpert MRSA Assay (FDA approved for NASAL specimens only), is one component of a comprehensive MRSA colonization surveillance program. It is not intended to diagnose MRSA infection nor to guide or monitor treatment for MRSA  infections. Performed at [facility], [place] [address] [place],  [address]   Expectorated Sputum Assessment w Gram Stain, Rflx to Resp Cult     Status: None   Collection Time: 05/17/20 11:29 PM   Specimen: Sputum  Result Value Ref Range Status   Specimen Description SPUTUM  Final   Special Requests NONE  Final   Sputum evaluation   Final    THIS SPECIMEN IS ACCEPTABLE FOR SPUTUM CULTURE Performed at [facility], [place] [address], [place],  [address]    Report Status 05/18/2020 FINAL  Final  Culture, Respiratory w Gram Stain     Status: None   Collection Time: 05/17/20 11:29 PM   Specimen: SPU  Result Value Ref Range Status   Specimen Description SPUTUM  Final   Special Requests NONE Reflexed from [id number]  Final   Gram Stain   Final    FEW WBC PRESENT, PREDOMINANTLY PMN FEW GRAM NEGATIVE RODS FEW GRAM POSITIVE COCCI IN PAIRS IN CLUSTERS RARE GRAM POSITIVE RODS    Culture   Final    FEW Normal respiratory flora-no Staph aureus or Pseudomonas seen Performed at [facility], [place] [address], [place], [place] [address]    Report Status 05/20/2020 FINAL  Final  Culture, blood (Routine X 2) w Reflex to ID Panel     Status: None   Collection Time: 05/19/20 12:44 AM   Specimen: BLOOD  Result Value Ref Range Status   Specimen Description BLOOD RIGHT ARM  Final   Special Requests   Final    BOTTLES DRAWN AEROBIC AND ANAEROBIC Blood Culture adequate volume   Culture   Final    NO GROWTH 5 DAYS Performed at [facility], [place] [address], [place], [place] [address]    Report Status 05/24/2020 FINAL  Final  Culture, blood (Routine X 2) w Reflex to ID Panel     Status: None   Collection Time: 05/19/20 12:59 AM   Specimen: BLOOD RIGHT HAND  Result Value Ref Range Status   Specimen Description BLOOD RIGHT HAND  Final   Special Requests   Final    BOTTLES DRAWN AEROBIC AND ANAEROBIC Blood Culture results may not be optimal due to an inadequate volume of  blood received in culture bottles   Culture   Final    NO GROWTH 5 DAYS Performed at [facility], [place] [address] [place], [place] [address]    Report Status 05/24/2020 FINAL  Final  Expectorated Sputum Assessment w Gram Stain, Rflx to Resp Cult     Status: None   Collection Time: 05/19/20  6:30 AM   Specimen: Expectorated Sputum  Result Value Ref Range Status   Specimen Description EXPECTORATED SPUTUM  Final   Special Requests Normal  Final   Sputum evaluation   Final    THIS SPECIMEN IS ACCEPTABLE FOR SPUTUM CULTURE Performed at [facility], [place] [address], [place], [place] [address]    Report Status 05/22/2020 FINAL  Final  Culture, Respiratory w Gram Stain     Status: None   Collection Time: 05/19/20  6:30 AM  Result Value Ref Range Status   Specimen Description EXPECTORATED SPUTUM  Final   Special Requests Normal Reflexed from [id number]  Final   Gram Stain   Final    ABUNDANT WBC PRESENT,BOTH PMN AND MONONUCLEAR FEW SQUAMOUS EPITHELIAL CELLS PRESENT FEW GRAM POSITIVE COCCI RARE GRAM VARIABLE ROD    Culture   Final    RARE Normal respiratory flora-no Staph aureus or Pseudomonas seen Performed at [facility], [place] [address] [place], [place] [address]    Report Status 05/21/2020 FINAL  Final  Culture, blood (routine x 2)     Status: Abnormal (Preliminary result)   Collection Time: 05/31/20  9:28 AM   Specimen: BLOOD  Result Value Ref Range Status   Specimen Description BLOOD LEFT ANTECUBITAL  Final   Special Requests   Final    BOTTLES DRAWN AEROBIC AND ANAEROBIC Blood Culture results may not be optimal due to an inadequate volume of blood received in culture bottles   Culture  Setup Time (A)  Final    YEAST IN BOTH AEROBIC AND ANAEROBIC BOTTLES CRITICAL RESULT CALLED TO, READ BACK BY AND VERIFIED WITH: Philemon Kingdom FCP Performed at [facility], [place] [address], [place], [place] [address]    Culture CANDIDA GLABRATA (A)  Final    Report Status PENDING  Incomplete  Blood Culture ID Panel (Reflexed)     Status: Abnormal   Collection Time: 05/31/20  9:28 AM  Result Value Ref Range Status  Enterococcus faecalis NOT DETECTED NOT DETECTED Final   Enterococcus Faecium NOT DETECTED NOT DETECTED Final   Listeria monocytogenes NOT DETECTED NOT DETECTED Final   Staphylococcus species NOT DETECTED NOT DETECTED Final   Staphylococcus aureus (BCID) NOT DETECTED NOT DETECTED Final   Staphylococcus epidermidis NOT DETECTED NOT DETECTED Final   Staphylococcus lugdunensis NOT DETECTED NOT DETECTED Final   Streptococcus species NOT DETECTED NOT DETECTED Final   Streptococcus agalactiae NOT DETECTED NOT DETECTED Final   Streptococcus pneumoniae NOT DETECTED NOT DETECTED Final   Streptococcus pyogenes NOT DETECTED NOT DETECTED Final   A.calcoaceticus-baumannii NOT DETECTED NOT DETECTED Final   Bacteroides fragilis NOT DETECTED NOT DETECTED Final   Enterobacterales NOT DETECTED NOT DETECTED Final   Enterobacter cloacae complex NOT DETECTED NOT DETECTED Final   Escherichia coli NOT DETECTED NOT DETECTED Final   Klebsiella aerogenes NOT DETECTED NOT DETECTED Final   Klebsiella oxytoca NOT DETECTED NOT DETECTED Final   Klebsiella pneumoniae NOT DETECTED NOT DETECTED Final   Proteus species NOT DETECTED NOT DETECTED Final   Salmonella species NOT DETECTED NOT DETECTED Final   Serratia marcescens NOT DETECTED NOT DETECTED Final   Haemophilus influenzae NOT DETECTED NOT DETECTED Final   Neisseria meningitidis NOT DETECTED NOT DETECTED Final   Pseudomonas aeruginosa NOT DETECTED NOT DETECTED Final   Stenotrophomonas maltophilia NOT DETECTED NOT DETECTED Final   Candida albicans NOT DETECTED NOT DETECTED Final   Candida auris NOT DETECTED NOT DETECTED Final   Candida glabrata DETECTED (A) NOT DETECTED Final    Comment: CRITICAL RESULT CALLED TO, READ BACK BY AND VERIFIED WITH: PHARMD THOMAS J. [id number] FCP    Candida krusei NOT  DETECTED NOT DETECTED Final   Candida parapsilosis NOT DETECTED NOT DETECTED Final   Candida tropicalis NOT DETECTED NOT DETECTED Final   Cryptococcus neoformans/gattii NOT DETECTED NOT DETECTED Final    Comment: Performed at [facility], [address] [place], [place] [address]  Culture, blood (routine x 2)     Status: Abnormal (Preliminary result)   Collection Time: 05/31/20  9:36 AM   Specimen: BLOOD LEFT HAND  Result Value Ref Range Status   Specimen Description BLOOD LEFT HAND  Final   Special Requests   Final    BOTTLES DRAWN AEROBIC AND ANAEROBIC Blood Culture results may not be optimal due to an inadequate volume of blood received in culture bottles   Culture  Setup Time   Final    YEAST IN BOTH AEROBIC AND ANAEROBIC BOTTLES CRITICAL VALUE NOTED.  VALUE IS CONSISTENT WITH PREVIOUSLY REPORTED AND CALLED VALUE. Performed at [facility], [place] [address], [place], [place] [address]    Culture CANDIDA GLABRATA (A)  Final   Report Status PENDING  Incomplete  MRSA PCR Screening     Status: None   Collection Time: 05/31/20  3:52 PM   Specimen: Nasal Mucosa; Nasopharyngeal  Result Value Ref Range Status   MRSA by PCR NEGATIVE NEGATIVE Final    Comment:        The GeneXpert MRSA Assay (FDA approved for NASAL specimens only), is one component of a comprehensive MRSA colonization surveillance program. It is not intended to diagnose MRSA infection nor to guide or monitor treatment for MRSA infections. Performed at [facility], [place] [address] [place], [place] [address]   Culture, blood (Routine X 2) w Reflex to ID Panel     Status: None (Preliminary result)   Collection Time: 06/02/20 10:08 AM   Specimen:  BLOOD  Result Value Ref Range Status   Specimen Description BLOOD LEFT ANTECUBITAL  Final   Special Requests   Final    BOTTLES DRAWN AEROBIC ONLY Blood Culture adequate volume   Culture   Final    NO GROWTH 1 DAY Performed at [facility],  [address] [place], [place] [address]    Report Status PENDING  Incomplete  Culture, blood (Routine X 2) w Reflex to ID Panel     Status: None (Preliminary result)   Collection Time: 06/02/20 10:08 AM   Specimen: BLOOD LEFT HAND  Result Value Ref Range Status   Specimen Description BLOOD LEFT HAND  Final   Special Requests   Final    BOTTLES DRAWN AEROBIC ONLY Blood Culture adequate volume   Culture   Final    NO GROWTH 1 DAY Performed at [facility], [place] [address] [place], [place] [address]    Report Status PENDING  Incomplete    Studies/Results: 3/18 mri brain Reviewed  1. Resolving changes of recent intraparenchymal hemorrhage in the left thalamus. Scattered foci of hemosiderin deposition throughout the brain consistent with old brain hemorrhages. Pattern suggests amyloid angiopathy. 2. 7 or 8 mm focus of acute infarction within the deep white matter adjacent to the atrium of the right lateral ventricle. Two adjacent punctate acute cortical infarctions at the left posterior frontal vertex. Possible punctate acute deep white matter infarction of the corona radiography region the left corona radiography region. Adjacent punctate acute infarctions at the superior cerebellar vermis. This pattern of acute infarctions could be due to micro embolic disease from the heart or ascending aorta or could conceivably be secondary to vascular spasm induced by the previous hemorrhage. 3. No evidence of abnormal parenchymal or leptomeningeal enhancement to suggest intracranial infection by imaging. 4. Moderate ventriculomegaly as seen previously. Some residual blood products or layering dependently in the occipital horns of the lateral ventricles. Ventricular size does not appear increased over baseline.     Assessment/Plan: Active Problems:   Pressure injury of skin   IVH (intraventricular hemorrhage) (HCC)   Altered mental status   Encounter for intubation    Hypertensive emergency   Atrial flutter ([place])    Jason [name] is a 70 y.o. male with chronic decub ulcers, coronary disease, diabetes mellitus, paroxysmal atrial fibrillation stroke systolic heart failure who was admitted to the ICU with thalamic hemorrhage with intraventricular extension that required reversal of his anticoagulation intubation and placement of an external ventricular drain.  Course complicated by sepsis in setting of c-glabrata fungemia.    3/14 bcx c glabrata. No central line or a typical host for this, but dr Tommy Medal suspect translocation from decubitus ulcer (which doesn't appear grossly infected  AMS improved in setting of stroke, and also has been on empiric abx along with antifungal coverage  3/18 mri brain no leptomeningeal enhancement. EVD drain had been removed/dislodged 3/05  Initially on vori/fluconazole but narrowed to just anidulafungin after mri finding not suggestive of cns involvement. I agree we can monitor on anidulafungin and continue observign for cns improvement or otherwise. c glabrata sensitivity is pending (usually azole class resistance so not necessarily getting cns coverage previously and still improving, probably initial ams due to stroke)  Decub's sacral doesn't appear infected or needing abx tx. Stage 2-3  Recommendation: -continue eraxis -check tte -ophthalmology evaluation if possible in house.   I spent more than 30 minute reviewing data/chart, and coordinating care and >50% direct face  to face time providing counseling/discussing diagnostics/treatment plan with patient    LOS: 23 days   Jason [name] 06/03/2020, 1:30 PM

## 2020-06-04 ENCOUNTER — Inpatient Hospital Stay (HOSPITAL_COMMUNITY): Payer: Medicare HMO

## 2020-06-04 DIAGNOSIS — I38 Endocarditis, valve unspecified: Secondary | ICD-10-CM | POA: Diagnosis not present

## 2020-06-04 DIAGNOSIS — R4182 Altered mental status, unspecified: Secondary | ICD-10-CM | POA: Diagnosis not present

## 2020-06-04 DIAGNOSIS — A419 Sepsis, unspecified organism: Secondary | ICD-10-CM | POA: Diagnosis not present

## 2020-06-04 DIAGNOSIS — R6520 Severe sepsis without septic shock: Secondary | ICD-10-CM | POA: Diagnosis not present

## 2020-06-04 DIAGNOSIS — R509 Fever, unspecified: Secondary | ICD-10-CM

## 2020-06-04 DIAGNOSIS — L89153 Pressure ulcer of sacral region, stage 3: Secondary | ICD-10-CM

## 2020-06-04 DIAGNOSIS — B49 Unspecified mycosis: Secondary | ICD-10-CM | POA: Diagnosis not present

## 2020-06-04 LAB — CBC
HCT: 39.6 % (ref 39.0–52.0)
Hemoglobin: 12.7 g/dL — ABNORMAL LOW (ref 13.0–17.0)
MCH: 27.8 pg (ref 26.0–34.0)
MCHC: 32.1 g/dL (ref 30.0–36.0)
MCV: 86.7 fL (ref 80.0–100.0)
Platelets: 58 10*3/uL — ABNORMAL LOW (ref 150–400)
RBC: 4.57 MIL/uL (ref 4.22–5.81)
RDW: 15.4 % (ref 11.5–15.5)
WBC: 7.7 10*3/uL (ref 4.0–10.5)
nRBC: 0 % (ref 0.0–0.2)

## 2020-06-04 LAB — GLUCOSE, CAPILLARY
Glucose-Capillary: 128 mg/dL — ABNORMAL HIGH (ref 70–99)
Glucose-Capillary: 144 mg/dL — ABNORMAL HIGH (ref 70–99)
Glucose-Capillary: 148 mg/dL — ABNORMAL HIGH (ref 70–99)
Glucose-Capillary: 162 mg/dL — ABNORMAL HIGH (ref 70–99)
Glucose-Capillary: 183 mg/dL — ABNORMAL HIGH (ref 70–99)
Glucose-Capillary: 257 mg/dL — ABNORMAL HIGH (ref 70–99)

## 2020-06-04 LAB — BASIC METABOLIC PANEL
Anion gap: 11 (ref 5–15)
BUN: 51 mg/dL — ABNORMAL HIGH (ref 8–23)
CO2: 21 mmol/L — ABNORMAL LOW (ref 22–32)
Calcium: 7.9 mg/dL — ABNORMAL LOW (ref 8.9–10.3)
Chloride: 114 mmol/L — ABNORMAL HIGH (ref 98–111)
Creatinine, Ser: 1.85 mg/dL — ABNORMAL HIGH (ref 0.61–1.24)
GFR, Estimated: 38 mL/min — ABNORMAL LOW (ref >60)
Glucose, Bld: 154 mg/dL — ABNORMAL HIGH (ref 70–99)
Potassium: 3.5 mmol/L (ref 3.5–5.1)
Sodium: 146 mmol/L — ABNORMAL HIGH (ref 135–145)

## 2020-06-04 NOTE — Progress Notes (Signed)
°  SLP Cancellation Note  Patient Details Name: Jason [name] MRN: [id number] DOB: Jan 14, 1951   Cancelled treatment:       Reason Eval/Treat Not Completed: Patient's level of consciousness;Medical issues which prohibited therapy. Patient unable to maintain adequate alertness for PO's and per RN, has been this way all of today   Angela Nevin, MA, CCC-SLP Speech Therapy [facility]

## 2020-06-04 NOTE — Plan of Care (Signed)
  Problem: Safety: Goal: Non-violent Restraint(s) Outcome: Progressing   Problem: Education: Goal: Knowledge of disease or condition will improve Outcome: Progressing Goal: Knowledge of secondary prevention will improve Outcome: Progressing Goal: Knowledge of patient specific risk factors addressed and post discharge goals established will improve Outcome: Progressing Goal: Individualized Educational Video(s) Outcome: Progressing   Problem: Coping: Goal: Will verbalize positive feelings about self Outcome: Progressing Goal: Will identify appropriate support needs Outcome: Progressing   Problem: Health Behavior/Discharge Planning: Goal: Ability to manage health-related needs will improve Outcome: Progressing   Problem: Self-Care: Goal: Ability to participate in self-care as condition permits will improve Outcome: Progressing Goal: Verbalization of feelings and concerns over difficulty with self-care will improve Outcome: Progressing Goal: Ability to communicate needs accurately will improve Outcome: Progressing   Problem: Nutrition: Goal: Risk of aspiration will decrease Outcome: Progressing Goal: Dietary intake will improve Outcome: Progressing   Problem: Intracerebral Hemorrhage Tissue Perfusion: Goal: Complications of Intracerebral Hemorrhage will be minimized Outcome: Progressing   Problem: Education: Goal: Knowledge of General Education information will improve Description: Including pain rating scale, medication(s)/side effects and non-pharmacologic comfort measures Outcome: Progressing   Problem: Health Behavior/Discharge Planning: Goal: Ability to manage health-related needs will improve Outcome: Progressing   Problem: Clinical Measurements: Goal: Ability to maintain clinical measurements within normal limits will improve Outcome: Progressing Goal: Will remain free from infection Outcome: Progressing Goal: Diagnostic test results will improve Outcome:  Progressing Goal: Respiratory complications will improve Outcome: Progressing Goal: Cardiovascular complication will be avoided Outcome: Progressing   Problem: Activity: Goal: Risk for activity intolerance will decrease Outcome: Progressing   Problem: Nutrition: Goal: Adequate nutrition will be maintained Outcome: Progressing   Problem: Coping: Goal: Level of anxiety will decrease Outcome: Progressing   Problem: Elimination: Goal: Will not experience complications related to bowel motility Outcome: Progressing Goal: Will not experience complications related to urinary retention Outcome: Progressing   Problem: Pain Managment: Goal: General experience of comfort will improve Outcome: Progressing   Problem: Safety: Goal: Ability to remain free from injury will improve Outcome: Progressing   Problem: Skin Integrity: Goal: Risk for impaired skin integrity will decrease Outcome: Progressing

## 2020-06-04 NOTE — Progress Notes (Signed)
Subjective: Had a fever again late 3/19 No new leukocytosis  Same nursing care team the last 48 hours. Stable poor ams. Opens eyes to voice and some upper ext movement but unclear if volitional  Antibiotics:  Anti-infectives (From admission, onward)   Start     Dose/Rate Route Frequency Ordered Stop   06/03/20 1300  anidulafungin (ERAXIS) 100 mg in sodium chloride 0.9 % 100 mL IVPB       "Followed by" Linked Group Details   100 mg 78 mL/hr over 100 Minutes Intravenous Every 24 hours 06/02/20 1119     06/02/20 1600  fluconazole (DIFLUCAN) IVPB 400 mg  Status:  Discontinued        400 mg 100 mL/hr over 120 Minutes Intravenous Every 24 hours 06/02/20 1119 06/02/20 1712   06/02/20 1300  anidulafungin (ERAXIS) 200 mg in sodium chloride 0.9 % 200 mL IVPB       "Followed by" Linked Group Details   200 mg 78 mL/hr over 200 Minutes Intravenous  Once 06/02/20 1119 06/02/20 2142   06/02/20 1200  anidulafungin (ERAXIS) 100 mg in sodium chloride 0.9 % 100 mL IVPB  Status:  Discontinued        100 mg 78 mL/hr over 100 Minutes Intravenous Every 24 hours 06/01/20 1230 06/01/20 1249   06/02/20 1000  voriconazole (VFEND) tablet 200 mg  Status:  Discontinued        200 mg Oral Every 12 hours 06/01/20 1347 06/01/20 1436   06/02/20 1000  voriconazole (VFEND) tablet 200 mg  Status:  Discontinued        200 mg Per Tube Every 12 hours 06/01/20 1436 06/02/20 1119   06/01/20 1445  voriconazole (VFEND) tablet 400 mg        400 mg Per Tube Every 12 hours 06/01/20 1436 06/01/20 2145   06/01/20 1430  voriconazole (VFEND) tablet 400 mg  Status:  Discontinued        400 mg Oral Every 12 hours 06/01/20 1347 06/01/20 1436   06/01/20 1330  anidulafungin (ERAXIS) 200 mg in sodium chloride 0.9 % 200 mL IVPB  Status:  Discontinued        200 mg 78 mL/hr over 200 Minutes Intravenous  Once 06/01/20 1230 06/01/20 1249   06/01/20 1300  vancomycin (VANCOREADY) IVPB 1000 mg/200 mL  Status:  Discontinued         1,000 mg 200 mL/hr over 60 Minutes Intravenous Every 24 hours 05/31/20 1323 06/01/20 0928   05/31/20 1900  piperacillin-tazobactam (ZOSYN) IVPB 3.375 g  Status:  Discontinued        3.375 g 12.5 mL/hr over 240 Minutes Intravenous Every 8 hours 05/31/20 1012 06/02/20 1346   05/31/20 1100  vancomycin (VANCOREADY) IVPB 2000 mg/400 mL        2,000 mg 200 mL/hr over 120 Minutes Intravenous  Once 05/31/20 1012 05/31/20 1634   05/31/20 1100  piperacillin-tazobactam (ZOSYN) IVPB 3.375 g        3.375 g 100 mL/hr over 30 Minutes Intravenous  Once 05/31/20 1012 05/31/20 1406   05/21/20 1100  vancomycin (VANCOREADY) IVPB 1750 mg/350 mL  Status:  Discontinued        1,750 mg 175 mL/hr over 120 Minutes Intravenous Every 24 hours 05/20/20 0953 05/23/20 1046   05/20/20 1045  vancomycin (VANCOREADY) IVPB 2000 mg/400 mL        2,000 mg 200 mL/hr over 120 Minutes Intravenous  Once 05/20/20 0948 05/20/20 1341  05/19/20 1015  cefTRIAXone (ROCEPHIN) 2 g in sodium chloride 0.9 % 100 mL IVPB  Status:  Discontinued        2 g 200 mL/hr over 30 Minutes Intravenous Every 24 hours 05/18/20 0958 05/18/20 1132   05/18/20 1400  piperacillin-tazobactam (ZOSYN) IVPB 3.375 g  Status:  Discontinued        3.375 g 12.5 mL/hr over 240 Minutes Intravenous Every 8 hours 05/18/20 1132 05/23/20 1046   05/17/20 1015  cefTRIAXone (ROCEPHIN) 1 g in sodium chloride 0.9 % 100 mL IVPB  Status:  Discontinued        1 g 200 mL/hr over 30 Minutes Intravenous Every 24 hours 05/17/20 0918 05/18/20 0958   05/12/20 1530  ceFAZolin (ANCEF) IVPB 1 g/50 mL premix  Status:  Discontinued        1 g 100 mL/hr over 30 Minutes Intravenous Every 8 hours 05/12/20 1440 05/17/20 0918      Medications: Scheduled Meds: . amantadine  100 mg Per Tube BID  . chlorhexidine gluconate (MEDLINE KIT)  15 mL Mouth Rinse BID  . diltiazem  30 mg Per Tube Q8H  . feeding supplement (PROSource TF)  45 mL Per Tube TID  . free water  200 mL Per Tube Q4H  .  insulin aspart  0-20 Units Subcutaneous Q4H  . insulin glargine  10 Units Subcutaneous Daily  . levETIRAcetam  750 mg Per Tube BID  . mouth rinse  15 mL Mouth Rinse q12n4p  . metoprolol tartrate  25 mg Per Tube BID  . pantoprazole sodium  40 mg Per Tube Daily  . thiamine  100 mg Per Tube Daily   Continuous Infusions: . sodium chloride    . anidulafungin 100 mg (06/03/20 1257)  . feeding supplement (JEVITY 1.5 CAL/FIBER) 1,000 mL (06/04/20 0540)   PRN Meds:.acetaminophen **OR** acetaminophen (TYLENOL) oral liquid 160 mg/5 mL **OR** acetaminophen, Gerhardt's butt cream, morphine injection, ondansetron (ZOFRAN) IV, polyethylene glycol, Resource ThickenUp Clear, senna-docusate    Objective: Weight change: -1.613 kg  Intake/Output Summary (Last 24 hours) at 06/04/2020 1342 Last data filed at 06/04/2020 1312 Gross per 24 hour  Intake --  Output 1950 ml  Net -1950 ml   Blood pressure 117/78, pulse 98, temperature 98 F (36.7 C), resp. rate (!) 38, height 5' 10" (1.778 m), weight 100.9 kg, SpO2 94 %. Temp:  [98 F (36.7 C)-102 F (38.9 C)] 98 F (36.7 C) (03/20 1254) Pulse Rate:  [77-126] 98 (03/20 1254) Resp:  [32-47] 38 (03/20 1254) BP: (99-160)/(65-124) 117/78 (03/20 1254) SpO2:  [90 %-97 %] 94 % (03/20 1254) Weight:  [100.9 kg] 100.9 kg (03/20 0500)  Physical Exam: Physical Exam HENT:     Head: Normocephalic.  Eyes:     Extraocular Movements: Extraocular movements intact.  Cardiovascular:     Rate and Rhythm: Normal rate.     Heart sounds: No murmur heard. No friction rub. No gallop.   Pulmonary:     Effort: Pulmonary effort is normal. No respiratory distress.     Breath sounds: No stridor. No wheezing.  Skin:    General: Skin is warm and dry.     Comments: Sacral irregular shape stage 3 ulcer  No surrounding erythema or fluctuance No purulence  Neurological:     Mental Status: He is alert.     Comments: Patient opened eyes to void and minimal movement of  extremities. I am unable to do full neuro exam due to mentation   He continues to  follow commands he named his son he responds to his name  CBC: Lab Results  Component Value Date   WBC 7.7 06/04/2020   HGB 12.7 (L) 06/04/2020   HCT 39.6 06/04/2020   MCV 86.7 06/04/2020   PLT 58 (L) 06/04/2020      BMET Recent Labs    06/03/20 0117 06/04/20 0326  NA 146* 146*  K 3.8 3.5  CL 116* 114*  CO2 23 21*  GLUCOSE 224* 154*  BUN 56* 51*  CREATININE 2.22* 1.85*  CALCIUM 7.7* 7.9*     Liver Panel  No results for input(s): PROT, ALBUMIN, AST, ALT, ALKPHOS, BILITOT, BILIDIR, IBILI in the last 72 hours.     Sedimentation Rate No results for input(s): ESRSEDRATE in the last 72 hours. C-Reactive Protein No results for input(s): CRP in the last 72 hours.  Micro Results: Recent Results (from the past 720 hour(s))  Blood Cultures (routine x 2)     Status: None   Collection Time: 05/11/20  7:36 PM   Specimen: Site Not Specified; Blood  Result Value Ref Range Status   Specimen Description   Final    SITE NOT SPECIFIED Performed at [facility] [address], [place], [place] [address]    Special Requests   Final    BOTTLES DRAWN AEROBIC AND ANAEROBIC Blood Culture results may not be optimal due to an inadequate volume of blood received in culture bottles Performed at [facility] [address], [place], [place] [address]    Culture   Final    NO GROWTH 5 DAYS Performed at [facility], [place] [address], [place], [place] [address]    Report Status 05/16/2020 FINAL  Final  Blood Cultures (routine x 2)     Status: None   Collection Time: 05/11/20  7:36 PM   Specimen: BLOOD  Result Value Ref Range Status   Specimen Description   Final    BLOOD LEFT WRIST Performed at [facility] [address] [place], [place] [address]    Special Requests   Final    BOTTLES DRAWN AEROBIC AND ANAEROBIC Blood Culture  adequate volume Performed at [facility] [address] [place], [place] [address]    Culture   Final    NO GROWTH 5 DAYS Performed at [facility], [place] [address] [place], [place] [address]    Report Status 05/16/2020 FINAL  Final  Urine culture     Status: None   Collection Time: 05/11/20  9:00 PM   Specimen: Urine, Random  Result Value Ref Range Status   Specimen Description   Final    URINE, RANDOM Performed at [facility] [address], [place], [place] [address]    Special Requests   Final    NONE Performed at [facility], [place] [address] [place], [place] [address]    Culture   Final    NO GROWTH Performed at [facility], [place] [address] [place], [place] [address]    Report Status 05/13/2020 FINAL  Final  Resp Panel by RT-PCR (Flu A&B, Covid) Nasopharyngeal Swab     Status: None   Collection Time: 05/11/20  9:30 PM   Specimen: Nasopharyngeal Swab; Nasopharyngeal(NP) swabs in vial transport medium  Result Value Ref Range Status   SARS Coronavirus 2 by RT PCR NEGATIVE NEGATIVE Final    Comment: (NOTE) SARS-CoV-2 target nucleic acids are NOT DETECTED.  The SARS-CoV-2 RNA is generally detectable in upper respiratory specimens during the acute phase of infection. The lowest concentration of SARS-CoV-2 viral copies this assay can detect is 138 copies/mL. A negative result does not preclude SARS-Cov-2 infection and should not be used as the sole basis for treatment or other patient management decisions. A negative result may occur with  improper specimen collection/handling, submission of specimen other than nasopharyngeal swab, presence of viral mutation(s) within the areas targeted by this assay, and inadequate number of viral copies(<138 copies/mL). A negative result must be combined with clinical observations, patient history, and epidemiological information. The expected result is  Negative.  Fact Sheet for Patients:  EntrepreneurPulse.com.au  Fact Sheet for Healthcare Providers:  IncredibleEmployment.be  This test is no t yet approved or cleared by the [place] FDA and  has been authorized for detection and/or diagnosis of SARS-CoV-2 by FDA under an Emergency Use Authorization (EUA). This EUA will remain  in effect (meaning this test can be used) for the duration of the COVID-19 declaration under Section 564(b)(1) of the Act, 21 U.S.C.section 360bbb-3(b)(1), unless the authorization is terminated  or revoked sooner.       Influenza A by PCR NEGATIVE NEGATIVE Final   Influenza B by PCR NEGATIVE NEGATIVE Final    Comment: (NOTE) The Xpert Xpress SARS-CoV-2/FLU/RSV plus assay is intended as an aid in the diagnosis of influenza from Nasopharyngeal swab specimens and should not be used as a sole basis for treatment. Nasal washings and aspirates are unacceptable for Xpert Xpress SARS-CoV-2/FLU/RSV testing.  Fact Sheet for Patients: EntrepreneurPulse.com.au  Fact Sheet for Healthcare Providers: IncredibleEmployment.be  This test is not yet approved or cleared by the [place] FDA and has been authorized for detection and/or diagnosis of SARS-CoV-2 by FDA under an Emergency Use Authorization (EUA). This EUA will remain in effect (meaning this test can be used) for the duration of the COVID-19 declaration under Section 564(b)(1) of the Act, 21 U.S.C. section 360bbb-3(b)(1), unless the authorization is terminated or revoked.  Performed at [facility], [place] [address] [place], [place] [address]   MRSA PCR Screening     Status: None   Collection Time: 05/12/20 12:26 AM   Specimen: Nasopharyngeal Swab  Result Value Ref Range Status   MRSA by PCR NEGATIVE NEGATIVE Final    Comment:        The GeneXpert MRSA Assay (FDA approved for NASAL specimens only), is  one component of a comprehensive MRSA colonization surveillance program. It is not intended to diagnose MRSA infection nor to guide or monitor treatment for MRSA infections. Performed at [facility], [place] [address] [place], [place] [address]   Expectorated Sputum Assessment w Gram Stain, Rflx to Resp Cult     Status: None   Collection Time: 05/17/20 11:29 PM   Specimen: Sputum  Result Value Ref Range Status   Specimen Description SPUTUM  Final   Special Requests NONE  Final   Sputum evaluation   Final    THIS SPECIMEN IS ACCEPTABLE FOR SPUTUM CULTURE Performed at [facility], [place] [address], [place], [place] [address]    Report Status 05/18/2020 FINAL  Final  Culture, Respiratory w Gram Stain     Status: None   Collection Time: 05/17/20 11:29 PM   Specimen: SPU  Result Value Ref Range Status   Specimen Description SPUTUM  Final   Special Requests NONE Reflexed from [id number]  Final   Gram Stain   Final  FEW WBC PRESENT, PREDOMINANTLY PMN FEW GRAM NEGATIVE RODS FEW GRAM POSITIVE COCCI IN PAIRS IN CLUSTERS RARE GRAM POSITIVE RODS    Culture   Final    FEW Normal respiratory flora-no Staph aureus or Pseudomonas seen Performed at [facility], [address] [place], [place] [address]    Report Status 05/20/2020 FINAL  Final  Culture, blood (Routine X 2) w Reflex to ID Panel     Status: None   Collection Time: 05/19/20 12:44 AM   Specimen: BLOOD  Result Value Ref Range Status   Specimen Description BLOOD RIGHT ARM  Final   Special Requests   Final    BOTTLES DRAWN AEROBIC AND ANAEROBIC Blood Culture adequate volume   Culture   Final    NO GROWTH 5 DAYS Performed at [facility], [place] [address], [place], [place] [address]    Report Status 05/24/2020 FINAL  Final  Culture, blood (Routine X 2) w Reflex to ID Panel     Status: None   Collection Time: 05/19/20 12:59 AM   Specimen: BLOOD RIGHT HAND  Result Value Ref Range Status   Specimen  Description BLOOD RIGHT HAND  Final   Special Requests   Final    BOTTLES DRAWN AEROBIC AND ANAEROBIC Blood Culture results may not be optimal due to an inadequate volume of blood received in culture bottles   Culture   Final    NO GROWTH 5 DAYS Performed at [facility], [place] [address], [place], [place] [address]    Report Status 05/24/2020 FINAL  Final  Expectorated Sputum Assessment w Gram Stain, Rflx to Resp Cult     Status: None   Collection Time: 05/19/20  6:30 AM   Specimen: Expectorated Sputum  Result Value Ref Range Status   Specimen Description EXPECTORATED SPUTUM  Final   Special Requests Normal  Final   Sputum evaluation   Final    THIS SPECIMEN IS ACCEPTABLE FOR SPUTUM CULTURE Performed at [facility], [place] [address] [place], [place] [address]    Report Status 05/22/2020 FINAL  Final  Culture, Respiratory w Gram Stain     Status: None   Collection Time: 05/19/20  6:30 AM  Result Value Ref Range Status   Specimen Description EXPECTORATED SPUTUM  Final   Special Requests Normal Reflexed from [id number]  Final   Gram Stain   Final    ABUNDANT WBC PRESENT,BOTH PMN AND MONONUCLEAR FEW SQUAMOUS EPITHELIAL CELLS PRESENT FEW GRAM POSITIVE COCCI RARE GRAM VARIABLE ROD    Culture   Final    RARE Normal respiratory flora-no Staph aureus or Pseudomonas seen Performed at [facility], [place] [address] [place], [place] [address]    Report Status 05/21/2020 FINAL  Final  Culture, blood (routine x 2)     Status: Abnormal (Preliminary result)   Collection Time: 05/31/20  9:28 AM   Specimen: BLOOD  Result Value Ref Range Status   Specimen Description BLOOD LEFT ANTECUBITAL  Final   Special Requests   Final    BOTTLES DRAWN AEROBIC AND ANAEROBIC Blood Culture results may not be optimal due to an inadequate volume of blood received in culture bottles   Culture  Setup Time (A)  Final    YEAST IN BOTH AEROBIC AND ANAEROBIC BOTTLES CRITICAL RESULT CALLED TO, READ  BACK BY AND VERIFIED WITH: Herreid [id number] FCP    Culture (A)  Final    CANDIDA GLABRATA Sent  to [place] for further susceptibility testing. Performed at [facility], [place] [address], [place], [place] [address]    Report Status PENDING  Incomplete  Blood Culture ID Panel (Reflexed)     Status: Abnormal   Collection Time: 05/31/20  9:28 AM  Result Value Ref Range Status   Enterococcus faecalis NOT DETECTED NOT DETECTED Final   Enterococcus Faecium NOT DETECTED NOT DETECTED Final   Listeria monocytogenes NOT DETECTED NOT DETECTED Final   Staphylococcus species NOT DETECTED NOT DETECTED Final   Staphylococcus aureus (BCID) NOT DETECTED NOT DETECTED Final   Staphylococcus epidermidis NOT DETECTED NOT DETECTED Final   Staphylococcus lugdunensis NOT DETECTED NOT DETECTED Final   Streptococcus species NOT DETECTED NOT DETECTED Final   Streptococcus agalactiae NOT DETECTED NOT DETECTED Final   Streptococcus pneumoniae NOT DETECTED NOT DETECTED Final   Streptococcus pyogenes NOT DETECTED NOT DETECTED Final   A.calcoaceticus-baumannii NOT DETECTED NOT DETECTED Final   Bacteroides fragilis NOT DETECTED NOT DETECTED Final   Enterobacterales NOT DETECTED NOT DETECTED Final   Enterobacter cloacae complex NOT DETECTED NOT DETECTED Final   Escherichia coli NOT DETECTED NOT DETECTED Final   Klebsiella aerogenes NOT DETECTED NOT DETECTED Final   Klebsiella oxytoca NOT DETECTED NOT DETECTED Final   Klebsiella pneumoniae NOT DETECTED NOT DETECTED Final   Proteus species NOT DETECTED NOT DETECTED Final   Salmonella species NOT DETECTED NOT DETECTED Final   Serratia marcescens NOT DETECTED NOT DETECTED Final   Haemophilus influenzae NOT DETECTED NOT DETECTED Final   Neisseria meningitidis NOT DETECTED NOT DETECTED Final   Pseudomonas aeruginosa NOT DETECTED NOT DETECTED Final   Stenotrophomonas maltophilia NOT DETECTED NOT DETECTED Final   Candida albicans NOT DETECTED NOT DETECTED  Final   Candida auris NOT DETECTED NOT DETECTED Final   Candida glabrata DETECTED (A) NOT DETECTED Final    Comment: CRITICAL RESULT CALLED TO, READ BACK BY AND VERIFIED WITH: PHARMD THOMAS J. [id number] FCP    Candida krusei NOT DETECTED NOT DETECTED Final   Candida parapsilosis NOT DETECTED NOT DETECTED Final   Candida tropicalis NOT DETECTED NOT DETECTED Final   Cryptococcus neoformans/gattii NOT DETECTED NOT DETECTED Final    Comment: Performed at [facility], [place] [address], [place], [place] [address]  Culture, blood (routine x 2)     Status: Abnormal (Preliminary result)   Collection Time: 05/31/20  9:36 AM   Specimen: BLOOD LEFT HAND  Result Value Ref Range Status   Specimen Description BLOOD LEFT HAND  Final   Special Requests   Final    BOTTLES DRAWN AEROBIC AND ANAEROBIC Blood Culture results may not be optimal due to an inadequate volume of blood received in culture bottles   Culture  Setup Time   Final    YEAST IN BOTH AEROBIC AND ANAEROBIC BOTTLES CRITICAL VALUE NOTED.  VALUE IS CONSISTENT WITH PREVIOUSLY REPORTED AND CALLED VALUE. Performed at [facility], [place] [address], [place], [place] [address]    Culture CANDIDA GLABRATA (A)  Final   Report Status PENDING  Incomplete  MRSA PCR Screening     Status: None   Collection Time: 05/31/20  3:52 PM   Specimen: Nasal Mucosa; Nasopharyngeal  Result Value Ref Range Status   MRSA by PCR NEGATIVE NEGATIVE Final    Comment:        The GeneXpert MRSA Assay (FDA approved for NASAL specimens only), is one component of a comprehensive MRSA colonization surveillance program. It is not intended to  diagnose MRSA infection nor to guide or monitor treatment for MRSA infections. Performed at [facility], [place] [address], [place], [place] [address]   Culture, blood (Routine X 2) w Reflex to ID Panel     Status: None (Preliminary result)   Collection Time: 06/02/20 10:08 AM   Specimen: BLOOD  Result Value  Ref Range Status   Specimen Description BLOOD LEFT ANTECUBITAL  Final   Special Requests   Final    BOTTLES DRAWN AEROBIC ONLY Blood Culture adequate volume   Culture   Final    NO GROWTH 1 DAY Performed at [facility], [place] [address] [place], [place] [address]    Report Status PENDING  Incomplete  Culture, blood (Routine X 2) w Reflex to ID Panel     Status: None (Preliminary result)   Collection Time: 06/02/20 10:08 AM   Specimen: BLOOD LEFT HAND  Result Value Ref Range Status   Specimen Description BLOOD LEFT HAND  Final   Special Requests   Final    BOTTLES DRAWN AEROBIC ONLY Blood Culture adequate volume   Culture   Final    NO GROWTH 1 DAY Performed at [facility], [place] [address], [place],  [address]    Report Status PENDING  Incomplete    Studies/Results: 3/18 mri brain Reviewed  1. Resolving changes of recent intraparenchymal hemorrhage in the left thalamus. Scattered foci of hemosiderin deposition throughout the brain consistent with old brain hemorrhages. Pattern suggests amyloid angiopathy. 2. 7 or 8 mm focus of acute infarction within the deep white matter adjacent to the atrium of the right lateral ventricle. Two adjacent punctate acute cortical infarctions at the left posterior frontal vertex. Possible punctate acute deep white matter infarction of the corona radiography region the left corona radiography region. Adjacent punctate acute infarctions at the superior cerebellar vermis. This pattern of acute infarctions could be due to micro embolic disease from the heart or ascending aorta or could conceivably be secondary to vascular spasm induced by the previous hemorrhage. 3. No evidence of abnormal parenchymal or leptomeningeal enhancement to suggest intracranial infection by imaging. 4. Moderate ventriculomegaly as seen previously. Some residual blood products or layering dependently in the occipital horns of the lateral ventricles.  Ventricular size does not appear increased over baseline.     Assessment/Plan: Active Problems:   Pressure injury of skin   IVH (intraventricular hemorrhage) (HCC)   Altered mental status   Encounter for intubation   Hypertensive emergency   Atrial flutter ([place])   Fungemia   Metabolic encephalopathy   Severe sepsis without septic shock ([place])    Jason [name] is a 70 y.o. male with chronic decub ulcers, coronary disease, diabetes mellitus, paroxysmal atrial fibrillation stroke systolic heart failure who was admitted to the ICU with thalamic hemorrhage with intraventricular extension that required reversal of his anticoagulation intubation and placement of an external ventricular drain.  Course complicated by sepsis in setting of c-glabrata fungemia.    3/14 bcx c glabrata. No central line or a typical host for this, but dr Tommy Medal suspect translocation from decubitus ulcer (which doesn't appear grossly infected  AMS improved in setting of stroke, and also has been on empiric abx along with antifungal coverage  3/18 mri brain no leptomeningeal enhancement. EVD drain had been removed/dislodged 3/05  Initially on vori/fluconazole but narrowed to just anidulafungin after mri finding not suggestive of cns involvement. I agree we can monitor on anidulafungin and continue observign for  cns improvement or otherwise. c glabrata sensitivity is pending (usually azole class resistance so not necessarily getting cns coverage previously and still improving, probably initial ams due to stroke)  Decub's sacral doesn't appear infected or needing abx tx. Stage 2-3   ------------ 06/04/2020 Overall still clearly not baseline. Only opens eyes to voice and raised right arm but ?volitionally. No clear decline since switching from azole therapy to anidulafungin  Had another fever late 3/19. Will repeat culture. No increased o2 requirement  From cns standpoint, without a nidus, candida infection is  unusual. At this time will continue eraxis and observe  From a hemorrhagic stroke standpoint, very debilitated, and furthermore poor baseline functional status prior to stroke   Recommendation: -continue eraxis -f/u tte -ophthalmology evaluation if possible in house.      LOS: 24 days    T  06/04/2020, 1:42 PM

## 2020-06-04 NOTE — Progress Notes (Addendum)
PROGRESS NOTE    Jason [name]   YYT:[id number]  DOB: [birth year]  PCP: Jodi Marble, MD    DOA: 05/11/2020 LOS: 73   Brief Narrative   70 year old M with PMH of CAD, DM-2, PAF on Eliquis, CVA, HTN and systolic CHF admitted to ICU on 05/11/2020 with >1.2cm thalamic hemorrhage with intraventricular extension in the setting of hypertensive emergency.  He received Kcentra for Eliquis reversal.  He was intubated and had external ventricular drain placed on 05/12/2020.  He was extubated on 05/16/2020.  EVD dislodged on 05/20/2020.  He was reintubated on 3/5, and eventually extubated to BiPAP on 3/8.  He was transferred to [facility] service on 3/12, patient continues to be persistently encephalopathic.  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 Fib -Received KCentra for Eliquis reversal in ED -Followed by neurosurgery and neurology, external ventricular drain from 2/25-3/5.  -EEG with generalized slowing consistent with moderate to severe diffuse encephalopathy, continued on Keppra for seizure prophylaxis -Continue tube feeds for now, Friday and Saturday had mild improvement in his mental status, subsequently started on a dysphagia 1 diet  -More attempted today with temp of 102, could be secondary to fungemia or ongoing aspiration, repeat CXR -started dysphagia 1 diet, no plans for PEG tube at this time -Remains on tube feeds via cortrak  Candida glabrata fungemia -Febrile 3/16, blood cx+ for Candida glabrata -Appreciate  infectious disease input now on Eraxis and fluconazole, discussed with neurology highly unlikely to have fungal meningitis -MRI brain without LM enhancement to suggest meningitis, decubitus wounds could be source of fungemia -Continue Eraxis -If patient continues to make clinical progress will ask ophthalmology to evaluate on Monday to exclude fungal endophthalmitis per ID recommendations -Follow-up repeat blood cultures from 3/18 are negative  Acute respiratory failure with hypoxia  -Secondary to hydrocephalus and intracranial hemorrhage, - intubation and mechanical ventilation from 2/25-3/1, reintubated 3/5-3/8 --More stable now, monitor  Persistent A. fib/A flutter: -Rate has been controlled mostly in 60's. Was on Eliquis POA. -Eliquis discontinued in the setting of intracranial hemorrhage -Blood pressure stabilized, continue Cardizem and metoprolol  Chronic systolic CHF:  - LVEF 45 to 50%.  - no overt fluid overload. -Monitor off diuretics at this time  Hypernatremia:  -Improving, continue free water flushes  Hypertensive emergency/uncontrolled hypertension:  BP 205/120 on admit.    -now normotensive, continue Cardizem and metoprolol  History of CAD: Stable -Cardiac meds as above.  Uncontrolled IDDM-2: A1c 9.4% -Blood sugars trending up again, restart Lantus, continue sliding scale  Pressure skin injury: Stage II Pressure Injury 05/24/20 Buttocks Bilateral;Medial Deep Tissue Pressure Injury - Purple or maroon localized area of discolored intact skin or blood-filled blister due to damage of underlying soft tissue from pressure and/or shear. Multiple blisters along (Active)  05/24/20 0800  Location: Buttocks  Location Orientation: Bilateral;Medial  Staging: Deep Tissue Pressure Injury - Purple or maroon localized area of discolored intact skin or blood-filled blister due to damage of underlying soft tissue from pressure and/or shear.  Wound Description (Comments):  Multiple blisters along with deep tissue injury.  Present on Admission:  Obesity: Body mass index is 31.92 kg/m.  Complicates overall care and prognosis.  DVT prophylaxis: Lovenox   CODE STATUS: DNR Family communication: d/w son 3/17, 3/18  Subjective   -Febrile to 102 today, more obtunded  Disposition Plan & Communication   Status is: Inpatient  Inpatient status is appropriate due to severity of illness  Dispo: The patient is from: Home              Anticipated d/c is to: SNF              Patient currently is not medically stable for d/c   Difficult to place patient - No   Consults, Procedures, Significant Events   Consultants:  Neurosurgery PCCM Infectious disease Cardiology Neurology Palliative medicine  Procedures:  ETT 2/25 >> 3/1, 3/5 >> 3/8 EVD 2/25 >> 3/5  Antimicrobials:  Anti-infectives (From admission, onward)   Start     Dose/Rate Route Frequency Ordered Stop   06/03/20 1300  anidulafungin (ERAXIS) 100 mg in sodium chloride 0.9 % 100 mL IVPB       "Followed by" Linked Group Details   100 mg 78 mL/hr over 100 Minutes Intravenous Every 24 hours 06/02/20 1119     06/02/20 1600  fluconazole (DIFLUCAN) IVPB 400 mg  Status:  Discontinued        400 mg 100 mL/hr over 120 Minutes Intravenous Every 24 hours 06/02/20 1119 06/02/20 1712   06/02/20 1300  anidulafungin (ERAXIS) 200 mg in sodium chloride 0.9 % 200 mL IVPB       "Followed by" Linked Group Details   200 mg 78 mL/hr over 200 Minutes Intravenous  Once 06/02/20 1119 06/02/20 2142   06/02/20 1200  anidulafungin (ERAXIS) 100 mg in sodium chloride 0.9 % 100 mL IVPB  Status:  Discontinued        100 mg 78 mL/hr over 100 Minutes Intravenous Every 24 hours 06/01/20 1230 06/01/20 1249   06/02/20 1000  voriconazole (VFEND) tablet 200 mg  Status:  Discontinued        200 mg Oral Every 12 hours 06/01/20 1347 06/01/20 1436   06/02/20 1000  voriconazole (VFEND) tablet 200 mg  Status:  Discontinued         200 mg Per Tube Every 12 hours 06/01/20 1436 06/02/20 1119   06/01/20 1445  voriconazole (VFEND) tablet 400 mg        400 mg Per Tube Every 12 hours 06/01/20 1436 06/01/20 2145   06/01/20 1430  voriconazole (VFEND) tablet 400 mg  Status:  Discontinued        400 mg Oral Every 12 hours 06/01/20 1347 06/01/20 1436   06/01/20 1330  anidulafungin (ERAXIS) 200 mg in sodium chloride 0.9 % 200 mL IVPB  Status:  Discontinued        200 mg 78 mL/hr over 200 Minutes Intravenous  Once 06/01/20 1230 06/01/20 1249   06/01/20 1300  vancomycin (VANCOREADY) IVPB 1000 mg/200 mL  Status:  Discontinued        1,000 mg 200 mL/hr over 60 Minutes Intravenous Every 24 hours 05/31/20 1323 06/01/20 0928   05/31/20 1900  piperacillin-tazobactam (ZOSYN) IVPB 3.375 g  Status:  Discontinued        3.375 g 12.5 mL/hr over 240 Minutes Intravenous Every 8 hours 05/31/20 1012 06/02/20 1346   05/31/20 1100  vancomycin (VANCOREADY) IVPB 2000 mg/400 mL        2,000 mg 200 mL/hr over 120 Minutes Intravenous  Once 05/31/20 1012 05/31/20  1634   05/31/20 1100  piperacillin-tazobactam (ZOSYN) IVPB 3.375 g        3.375 g 100 mL/hr over 30 Minutes Intravenous  Once 05/31/20 1012 05/31/20 1406   05/21/20 1100  vancomycin (VANCOREADY) IVPB 1750 mg/350 mL  Status:  Discontinued        1,750 mg 175 mL/hr over 120 Minutes Intravenous Every 24 hours 05/20/20 0953 05/23/20 1046   05/20/20 1045  vancomycin (VANCOREADY) IVPB 2000 mg/400 mL        2,000 mg 200 mL/hr over 120 Minutes Intravenous  Once 05/20/20 0948 05/20/20 1341   05/19/20 1015  cefTRIAXone (ROCEPHIN) 2 g in sodium chloride 0.9 % 100 mL IVPB  Status:  Discontinued        2 g 200 mL/hr over 30 Minutes Intravenous Every 24 hours 05/18/20 0958 05/18/20 1132   05/18/20 1400  piperacillin-tazobactam (ZOSYN) IVPB 3.375 g  Status:  Discontinued        3.375 g 12.5 mL/hr over 240 Minutes Intravenous Every 8 hours 05/18/20 1132 05/23/20 1046   05/17/20 1015  cefTRIAXone  (ROCEPHIN) 1 g in sodium chloride 0.9 % 100 mL IVPB  Status:  Discontinued        1 g 200 mL/hr over 30 Minutes Intravenous Every 24 hours 05/17/20 0918 05/18/20 0958   05/12/20 1530  ceFAZolin (ANCEF) IVPB 1 g/50 mL premix  Status:  Discontinued        1 g 100 mL/hr over 30 Minutes Intravenous Every 8 hours 05/12/20 1440 05/17/20 0918        Micro    Objective   Vitals:   06/04/20 0759 06/04/20 0820 06/04/20 0820 06/04/20 0920  BP: 99/82 124/75 124/75 103/65  Pulse: (!) 119 (!) 111 (!) 114 77  Resp: (!) 38 (!) 40  (!) 36  Temp: (!) 101.1 F (38.4 C) (!) 101.1 F (38.4 C)  98.5 F (36.9 C)  TempSrc: Rectal Rectal  Axillary  SpO2: 90% 96% 97% 92%  Weight:      Height:        Intake/Output Summary (Last 24 hours) at 06/04/2020 1104 Last data filed at 06/04/2020 0500 Gross per 24 hour  Intake 300 ml  Output 1400 ml  Net -1100 ml   Filed Weights   06/02/20 0500 06/03/20 0500 06/04/20 0500  Weight: 104.3 kg 102.5 kg 100.9 kg    Physical Exam: Chronically ill elderly male laying in bed, appears more lethargic today, did not follow any commands HEENT: Pupils equal and reactive CVS: S1-S2, regular rate rhythm Lungs: Scattered rhonchi Abdomen: Soft, nontender, bowel sounds present Extremities: No edema, offloading boots on  Neuro: Poorly responsive, more attempted today, did not follow any commands Labs   Data Reviewed: I have personally reviewed following labs and imaging studies  CBC: Recent Labs  Lab 05/31/20 0331 06/01/20 0435 06/02/20 0404 06/03/20 0117 06/04/20 0326  WBC 9.9 7.3 4.9 6.4 7.7  HGB 14.0 14.0 12.5* 12.3* 12.7*  HCT 44.8 44.0 39.9 39.2 39.6  MCV 90.0 88.9 89.1 88.7 86.7  PLT 219 108* 87* 79* 58*   Basic Metabolic Panel: Recent Labs  Lab 05/31/20 0331 06/01/20 0435 06/02/20 0404 06/03/20 0117 06/04/20 0326  NA 149* 144 146* 146* 146*  K 3.8 4.2 3.8 3.8 3.5  CL 112* 112* 113* 116* 114*  CO2 _0 21*  GLUCOSE 78 128* 216* 224*  154*  BUN 42* 51* 55* 56* 51*  CREATININE 1.49* 2.06* 2.16* 2.22* 1.85*  CALCIUM 8.1*  7.5* 7.5* 7.7* 7.9*  MG 2.8*  --   --   --   --    GFR: Estimated Creatinine Clearance: 43.6 mL/min (A) (by C-G formula based on SCr of 1.85 mg/dL (H)). Liver Function Tests: Recent Labs  Lab 05/31/20 0331  AST 35  ALT 46*  ALKPHOS 66  BILITOT 0.6  PROT 6.4*  ALBUMIN 2.0*   No results for input(s): LIPASE, AMYLASE in the last 168 hours. No results for input(s): AMMONIA in the last 168 hours. Coagulation Profile: No results for input(s): INR, PROTIME in the last 168 hours. Cardiac Enzymes: No results for input(s): CKTOTAL, CKMB, CKMBINDEX, TROPONINI in the last 168 hours. BNP (last 3 results) No results for input(s): PROBNP in the last 8760 hours. HbA1C: No results for input(s): HGBA1C in the last 72 hours. CBG: Recent Labs  Lab 06/03/20 1707 06/03/20 1939 06/04/20 0003 06/04/20 0423 06/04/20 0803  GLUCAP 202* 215* 144* 148* 128*   Lipid Profile: No results for input(s): CHOL, HDL, LDLCALC, TRIG, CHOLHDL, LDLDIRECT in the last 72 hours. Thyroid Function Tests: No results for input(s): TSH, T4TOTAL, FREET4, T3FREE, THYROIDAB in the last 72 hours. Anemia Panel: No results for input(s): VITAMINB12, FOLATE, FERRITIN, TIBC, IRON, RETICCTPCT in the last 72 hours. Sepsis Labs: Recent Labs  Lab 05/31/20 0933 05/31/20 1227 05/31/20 1534 06/01/20 0435 06/02/20 0404  PROCALCITON  --   --  2.35 3.50 2.52  LATICACIDVEN 2.0* 2.1*  --   --   --     Recent Results (from the past 240 hour(s))  Culture, blood (routine x 2)     Status: Abnormal (Preliminary result)   Collection Time: 05/31/20  9:28 AM   Specimen: BLOOD  Result Value Ref Range Status   Specimen Description BLOOD LEFT ANTECUBITAL  Final   Special Requests   Final    BOTTLES DRAWN AEROBIC AND ANAEROBIC Blood Culture results may not be optimal due to an inadequate volume of blood received in culture bottles   Culture  Setup  Time (A)  Final    YEAST IN BOTH AEROBIC AND ANAEROBIC BOTTLES CRITICAL RESULT CALLED TO, READ BACK BY AND VERIFIED WITH: [place] J. [id number] FCP    Culture (A)  Final    CANDIDA GLABRATA Sent to [place] for further susceptibility testing. Performed at [facility], [place] [address], [place], [place] [address]    Report Status PENDING  Incomplete  Blood Culture ID Panel (Reflexed)     Status: Abnormal   Collection Time: 05/31/20  9:28 AM  Result Value Ref Range Status   Enterococcus faecalis NOT DETECTED NOT DETECTED Final   Enterococcus Faecium NOT DETECTED NOT DETECTED Final   Listeria monocytogenes NOT DETECTED NOT DETECTED Final   Staphylococcus species NOT DETECTED NOT DETECTED Final   Staphylococcus aureus (BCID) NOT DETECTED NOT DETECTED Final   Staphylococcus epidermidis NOT DETECTED NOT DETECTED Final   Staphylococcus lugdunensis NOT DETECTED NOT DETECTED Final   Streptococcus species NOT DETECTED NOT DETECTED Final   Streptococcus agalactiae NOT DETECTED NOT DETECTED Final   Streptococcus pneumoniae NOT DETECTED NOT DETECTED Final   Streptococcus pyogenes NOT DETECTED NOT DETECTED Final   A.calcoaceticus-baumannii NOT DETECTED NOT DETECTED Final   Bacteroides fragilis NOT DETECTED NOT DETECTED Final   Enterobacterales NOT DETECTED NOT DETECTED Final   Enterobacter cloacae complex NOT DETECTED NOT DETECTED Final   Escherichia coli NOT DETECTED NOT DETECTED Final   Klebsiella aerogenes NOT DETECTED NOT DETECTED Final   Klebsiella oxytoca NOT  DETECTED NOT DETECTED Final   Klebsiella pneumoniae NOT DETECTED NOT DETECTED Final   Proteus species NOT DETECTED NOT DETECTED Final   Salmonella species NOT DETECTED NOT DETECTED Final   Serratia marcescens NOT DETECTED NOT DETECTED Final   Haemophilus influenzae NOT DETECTED NOT DETECTED Final   Neisseria meningitidis NOT DETECTED NOT DETECTED Final   Pseudomonas aeruginosa NOT DETECTED NOT DETECTED Final    Stenotrophomonas maltophilia NOT DETECTED NOT DETECTED Final   Candida albicans NOT DETECTED NOT DETECTED Final   Candida auris NOT DETECTED NOT DETECTED Final   Candida glabrata DETECTED (A) NOT DETECTED Final    Comment: CRITICAL RESULT CALLED TO, READ BACK BY AND VERIFIED WITH: PHARMD THOMAS J. [id number] FCP    Candida krusei NOT DETECTED NOT DETECTED Final   Candida parapsilosis NOT DETECTED NOT DETECTED Final   Candida tropicalis NOT DETECTED NOT DETECTED Final   Cryptococcus neoformans/gattii NOT DETECTED NOT DETECTED Final    Comment: Performed at [facility], [address], [place], [place] [address]  Culture, blood (routine x 2)     Status: Abnormal (Preliminary result)   Collection Time: 05/31/20  9:36 AM   Specimen: BLOOD LEFT HAND  Result Value Ref Range Status   Specimen Description BLOOD LEFT HAND  Final   Special Requests   Final    BOTTLES DRAWN AEROBIC AND ANAEROBIC Blood Culture results may not be optimal due to an inadequate volume of blood received in culture bottles   Culture  Setup Time   Final    YEAST IN BOTH AEROBIC AND ANAEROBIC BOTTLES CRITICAL VALUE NOTED.  VALUE IS CONSISTENT WITH PREVIOUSLY REPORTED AND CALLED VALUE. Performed at [facility], [place] [address], [place], [place] [address]    Culture CANDIDA GLABRATA (A)  Final   Report Status PENDING  Incomplete  MRSA PCR Screening     Status: None   Collection Time: 05/31/20  3:52 PM   Specimen: Nasal Mucosa; Nasopharyngeal  Result Value Ref Range Status   MRSA by PCR NEGATIVE NEGATIVE Final    Comment:        The GeneXpert MRSA Assay (FDA approved for NASAL specimens only), is one component of a comprehensive MRSA colonization surveillance program. It is not intended to diagnose MRSA infection nor to guide or monitor treatment for MRSA infections. Performed at [facility], [place] [address] [place], [place] [address]   Culture, blood (Routine X 2) w Reflex to ID Panel      Status: None (Preliminary result)   Collection Time: 06/02/20 10:08 AM   Specimen: BLOOD  Result Value Ref Range Status   Specimen Description BLOOD LEFT ANTECUBITAL  Final   Special Requests   Final    BOTTLES DRAWN AEROBIC ONLY Blood Culture adequate volume   Culture   Final    NO GROWTH 1 DAY Performed at [facility], [place] [address], [place], [place] [address]    Report Status PENDING  Incomplete  Culture, blood (Routine X 2) w Reflex to ID Panel     Status: None (Preliminary result)   Collection Time: 06/02/20 10:08 AM   Specimen: BLOOD LEFT HAND  Result Value Ref Range Status   Specimen Description BLOOD LEFT HAND  Final   Special Requests   Final    BOTTLES DRAWN AEROBIC ONLY Blood Culture adequate volume   Culture   Final    NO GROWTH 1 DAY Performed at [facility], [place]  [address], [place], [place] [address]    Report Status PENDING  Incomplete     Imaging Studies   MR BRAIN W WO CONTRAST  Result Date: 06/02/2020 CLINICAL DATA:  Thalamic hemorrhage with intraventricular extension. Meningitis/infection suspected. EXAM: MRI HEAD WITHOUT AND WITH CONTRAST TECHNIQUE: Multiplanar, multiecho pulse sequences of the brain and surrounding structures were obtained without and with intravenous contrast. CONTRAST:  7.101m GADAVIST GADOBUTROL 1 MMOL/ML IV SOLN COMPARISON:  CT 2 days ago. Multiple previous CNS imaging exams since late February of this year. FINDINGS: Brain: Diffusion imaging shows a 7 or 8 mm focus of acute infarction within the deep white matter adjacent to the atrium of the right lateral ventricle. Two adjacent punctate acute cortical infarctions at the left posterior frontal vertex. Possible punctate acute deep white matter infarction of the corona radiography region the left. Few punctate acute infarctions at the superior cerebellar vermis. No large vessel territory infarction. Atrophy of the brainstem with old small vessel insults. Small focus of old  hemorrhage in the right cerebellar hemisphere. Cerebral hemispheres show generalized atrophy. Resolving changes of recent intraparenchymal hemorrhage in the left thalamus. Scattered foci hemosiderin deposition within the brain consistent with old brain hemorrhages. Findings raise the possibility of amyloid angiopathy. There is moderate ventriculomegaly as seen previously. Some residual blood products or layering dependently in the occipital horns of the lateral ventricles. Ventricular size does not appear increased over baseline. Chronic small-vessel ischemic changes are seen in a widespread fashion throughout the cerebral hemispheric white matter. After contrast administration, there is no evidence of abnormal parenchymal or leptomeningeal enhancement to suggest intracranial infection by imaging. Vascular: Major vessels at the base of the brain show flow. Skull and upper cervical spine: Negative Sinuses/Orbits: Paranasal sinuses are clear.  Orbits are negative. Other: Bilateral mastoid effusions are present. IMPRESSION: 1. Resolving changes of recent intraparenchymal hemorrhage in the left thalamus. Scattered foci of hemosiderin deposition throughout the brain consistent with old brain hemorrhages. Pattern suggests amyloid angiopathy. 2. 7 or 8 mm focus of acute infarction within the deep white matter adjacent to the atrium of the right lateral ventricle. Two adjacent punctate acute cortical infarctions at the left posterior frontal vertex. Possible punctate acute deep white matter infarction of the corona radiography region the left corona radiography region. Adjacent punctate acute infarctions at the superior cerebellar vermis. This pattern of acute infarctions could be due to micro embolic disease from the heart or ascending aorta or could conceivably be secondary to vascular spasm induced by the previous hemorrhage. 3. No evidence of abnormal parenchymal or leptomeningeal enhancement to suggest intracranial  infection by imaging. 4. Moderate ventriculomegaly as seen previously. Some residual blood products or layering dependently in the occipital horns of the lateral ventricles. Ventricular size does not appear increased over baseline. Electronically Signed   By: MNelson ChimesM.D.   On: 06/02/2020 16:19   DG CHEST PORT 1 VIEW  Result Date: 06/04/2020 CLINICAL DATA:  Hypoxia EXAM: PORTABLE CHEST 1 VIEW COMPARISON:  May 31, 2020 FINDINGS: Mediastinal contour and cardiac silhouette are stable. Heart size is enlarged. Feeding tube is identified unchanged. Patchy consolidation of left lung base is unchanged. The right lung is clear. Old posttraumatic deformity of several right ribs are unchanged. IMPRESSION: Patchy consolidation of left lung base unchanged. Electronically Signed   By: WAbelardo DieselM.D.   On: 06/04/2020 09:20     Medications   Scheduled Meds: . amantadine  100 mg Per Tube BID  . chlorhexidine gluconate (MEDLINE KIT)  15  mL Mouth Rinse BID  . diltiazem  30 mg Per Tube Q8H  . feeding supplement (PROSource TF)  45 mL Per Tube TID  . free water  200 mL Per Tube Q4H  . insulin aspart  0-20 Units Subcutaneous Q4H  . insulin glargine  10 Units Subcutaneous Daily  . levETIRAcetam  750 mg Per Tube BID  . mouth rinse  15 mL Mouth Rinse q12n4p  . metoprolol tartrate  25 mg Per Tube BID  . pantoprazole sodium  40 mg Per Tube Daily  . thiamine  100 mg Per Tube Daily   Continuous Infusions: . sodium chloride    . anidulafungin 100 mg (06/03/20 1257)  . feeding supplement (JEVITY 1.5 CAL/FIBER) 1,000 mL (06/04/20 0540)     LOS: 24 days   Time spent: 25 minutes   Domenic Polite, MD [facility]  06/04/2020, 11:04 AM

## 2020-06-04 NOTE — Progress Notes (Signed)
*  PRELIMINARY RESULTS* Echocardiogram Limited 2-D Echocardiogram has been performed with saline bubble study.  Stacey Drain 06/04/2020, 2:05 PM

## 2020-06-05 DIAGNOSIS — R4182 Altered mental status, unspecified: Secondary | ICD-10-CM | POA: Diagnosis not present

## 2020-06-05 DIAGNOSIS — Z1632 Resistance to antifungal drug(s): Secondary | ICD-10-CM

## 2020-06-05 DIAGNOSIS — J9600 Acute respiratory failure, unspecified whether with hypoxia or hypercapnia: Secondary | ICD-10-CM

## 2020-06-05 DIAGNOSIS — T17908A Unspecified foreign body in respiratory tract, part unspecified causing other injury, initial encounter: Secondary | ICD-10-CM

## 2020-06-05 DIAGNOSIS — B3789 Other sites of candidiasis: Secondary | ICD-10-CM | POA: Diagnosis not present

## 2020-06-05 DIAGNOSIS — I629 Nontraumatic intracranial hemorrhage, unspecified: Secondary | ICD-10-CM

## 2020-06-05 LAB — BLOOD CULTURE ID PANEL (REFLEXED) - BCID2

## 2020-06-05 LAB — GLUCOSE, CAPILLARY
Glucose-Capillary: 193 mg/dL — ABNORMAL HIGH (ref 70–99)
Glucose-Capillary: 207 mg/dL — ABNORMAL HIGH (ref 70–99)
Glucose-Capillary: 230 mg/dL — ABNORMAL HIGH (ref 70–99)
Glucose-Capillary: 247 mg/dL — ABNORMAL HIGH (ref 70–99)
Glucose-Capillary: 286 mg/dL — ABNORMAL HIGH (ref 70–99)
Glucose-Capillary: 290 mg/dL — ABNORMAL HIGH (ref 70–99)

## 2020-06-05 LAB — CBC
HCT: 44.7 % (ref 39.0–52.0)
Hemoglobin: 14.7 g/dL (ref 13.0–17.0)
MCH: 28.2 pg (ref 26.0–34.0)
MCHC: 32.9 g/dL (ref 30.0–36.0)
MCV: 85.6 fL (ref 80.0–100.0)
Platelets: 43 10*3/uL — ABNORMAL LOW (ref 150–400)
RBC: 5.22 MIL/uL (ref 4.22–5.81)
RDW: 15.7 % — ABNORMAL HIGH (ref 11.5–15.5)
WBC: 7.1 10*3/uL (ref 4.0–10.5)
nRBC: 0 % (ref 0.0–0.2)

## 2020-06-05 LAB — COMPREHENSIVE METABOLIC PANEL
ALT: 127 U/L — ABNORMAL HIGH (ref 0–44)
AST: 69 U/L — ABNORMAL HIGH (ref 15–41)
Albumin: 1.7 g/dL — ABNORMAL LOW (ref 3.5–5.0)
Alkaline Phosphatase: 385 U/L — ABNORMAL HIGH (ref 38–126)
Anion gap: 7 (ref 5–15)
BUN: 53 mg/dL — ABNORMAL HIGH (ref 8–23)
CO2: 23 mmol/L (ref 22–32)
Calcium: 8 mg/dL — ABNORMAL LOW (ref 8.9–10.3)
Chloride: 116 mmol/L — ABNORMAL HIGH (ref 98–111)
Creatinine, Ser: 1.79 mg/dL — ABNORMAL HIGH (ref 0.61–1.24)
GFR, Estimated: 40 mL/min — ABNORMAL LOW (ref >60)
Glucose, Bld: 254 mg/dL — ABNORMAL HIGH (ref 70–99)
Potassium: 3.5 mmol/L (ref 3.5–5.1)
Sodium: 146 mmol/L — ABNORMAL HIGH (ref 135–145)
Total Bilirubin: 0.7 mg/dL (ref 0.3–1.2)
Total Protein: 5.8 g/dL — ABNORMAL LOW (ref 6.5–8.1)

## 2020-06-05 MED ORDER — INSULIN GLARGINE 100 UNIT/ML ~~LOC~~ SOLN
15.0000 [IU] | Freq: Every day | SUBCUTANEOUS | Status: DC
  Administered 2020-06-06 – 2020-06-07 (×2): 15 [IU] via SUBCUTANEOUS
  Filled 2020-06-05 (×2): qty 0.15

## 2020-06-05 MED ORDER — TROPICAMIDE 1 % OP SOLN
1.0000 [drp] | Freq: Once | OPHTHALMIC | Status: AC
  Administered 2020-06-05: 1 [drp] via OPHTHALMIC
  Filled 2020-06-05: qty 15

## 2020-06-05 MED ORDER — PHENYLEPHRINE HCL 2.5 % OP SOLN
1.0000 [drp] | Freq: Once | OPHTHALMIC | Status: AC
  Administered 2020-06-05: 1 [drp] via OPHTHALMIC
  Filled 2020-06-05: qty 2

## 2020-06-05 MED ORDER — METOPROLOL TARTRATE 50 MG PO TABS
50.0000 mg | ORAL_TABLET | Freq: Two times a day (BID) | ORAL | Status: DC
  Administered 2020-06-05 – 2020-06-07 (×5): 50 mg
  Filled 2020-06-05 (×7): qty 1

## 2020-06-05 NOTE — Progress Notes (Signed)
PHARMACY - PHYSICIAN COMMUNICATION CRITICAL VALUE ALERT - BLOOD CULTURE IDENTIFICATION (BCID)  Jason [name] is an 70 y.o. male who presented to [facility] on 05/11/2020.  Blood cultures 1 of 9 bottle with staph epi (MEC + A).  Assessment:  Discussed with Dr. Arville Care, felt likely to be contaminant.  Name of physician (or Provider) ContactedArville Care, MD  Current antibiotics: Anidulafungin  Changes to prescribed antibiotics recommended:  none  Results for orders placed or performed during the hospital encounter of 05/11/20  Blood Culture ID Panel (Reflexed) (Collected: 06/04/2020  3:19 PM)  Result Value Ref Range   Enterococcus faecalis NOT DETECTED NOT DETECTED   Enterococcus Faecium NOT DETECTED NOT DETECTED   Listeria monocytogenes NOT DETECTED NOT DETECTED   Staphylococcus species DETECTED (A) NOT DETECTED   Staphylococcus aureus (BCID) NOT DETECTED NOT DETECTED   Staphylococcus epidermidis DETECTED (A) NOT DETECTED   Staphylococcus lugdunensis NOT DETECTED NOT DETECTED   Streptococcus species NOT DETECTED NOT DETECTED   Streptococcus agalactiae NOT DETECTED NOT DETECTED   Streptococcus pneumoniae NOT DETECTED NOT DETECTED   Streptococcus pyogenes NOT DETECTED NOT DETECTED   A.calcoaceticus-baumannii NOT DETECTED NOT DETECTED   Bacteroides fragilis NOT DETECTED NOT DETECTED   Enterobacterales NOT DETECTED NOT DETECTED   Enterobacter cloacae complex NOT DETECTED NOT DETECTED   Escherichia coli NOT DETECTED NOT DETECTED   Klebsiella aerogenes NOT DETECTED NOT DETECTED   Klebsiella oxytoca NOT DETECTED NOT DETECTED   Klebsiella pneumoniae NOT DETECTED NOT DETECTED   Proteus species NOT DETECTED NOT DETECTED   Salmonella species NOT DETECTED NOT DETECTED   Serratia marcescens NOT DETECTED NOT DETECTED   Haemophilus influenzae NOT DETECTED NOT DETECTED   Neisseria meningitidis NOT DETECTED NOT DETECTED   Pseudomonas aeruginosa NOT DETECTED NOT DETECTED   Stenotrophomonas maltophilia  NOT DETECTED NOT DETECTED   Candida albicans NOT DETECTED NOT DETECTED   Candida auris NOT DETECTED NOT DETECTED   Candida glabrata NOT DETECTED NOT DETECTED   Candida krusei NOT DETECTED NOT DETECTED   Candida parapsilosis NOT DETECTED NOT DETECTED   Candida tropicalis NOT DETECTED NOT DETECTED   Cryptococcus neoformans/gattii NOT DETECTED NOT DETECTED   Methicillin resistance mecA/C DETECTED (A) NOT DETECTED    Tad Moore, BCPS, BCCP Clinical Pharmacist  06/05/2020 8:13 PM   [facility] pharmacy phone numbers are listed on [email or web address]

## 2020-06-05 NOTE — Progress Notes (Signed)
PROGRESS NOTE    Jason [name]   HRC:[id number]  DOB: 02-01-1951  PCP: Jodi Marble, MD    DOA: 05/11/2020 LOS: 68   Brief Narrative   70 year old Male with history of CAD, DM-2, PAF on Eliquis, CVA, HTN and systolic CHF admitted to ICU on 05/11/2020 with >1.2cm thalamic hemorrhage with intraventricular extension in the setting of hypertensive emergency and acute hydrocephalus.  He received Kcentra for Eliquis reversal.  He was intubated and had external ventricular drain placed on 05/12/2020.  He was extubated on 05/16/2020.  EVD dislodged on 05/20/2020.  He was reintubated on 3/5, and eventually extubated to BiPAP on 3/8.  He was transferred from [facility] to [facility] service on 3/12, patient continues to be persistently encephalopathic.  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 Fib, received KCentra for Eliquis reversal in ED -Followed by neurosurgery and neurology, had external ventricular drain from 2/25-3/5.  -EEG noted generalized slowing consistent with moderate to severe diffuse encephalopathy, continued on Keppra for seizure prophylaxis -Continue tube feeds for now, 3/18-19 had mild improvement in his mental status, subsequently started on a dysphagia 1 diet  -started dysphagia 1 diet, no plans for PEG tube at this time -Remains on tube feeds via cortrak, until po intake improves  Candida glabrata fungemia -Febrile 3/16, blood cx+ for Candida glabrata -Appreciate infectious disease input now on Eraxis and fluconazole, discussed with  neurology highly unlikely to have fungal meningitis -MRI brain without LM enhancement to suggest meningitis, decubitus wounds could be source of fungemia -Continue Eraxis -ID recommended ophthalmology evaluation to exclude fungal endophthalmitis, d/w Dr.Bowen today -repeat blood Cx 3/18 w/ yeast again -fevers improving  Acute respiratory failure with hypoxia  -Secondary to hydrocephalus and intracranial hemorrhage, - intubation and mechanical ventilation from 2/25-3/1, reintubated 3/5-3/8 - continues to be intermittently tachypnic, seen by PCCM multiple times  Persistent A. fib/A flutter: -Rate has been controlled mostly in 60's. Was on Eliquis POA. -Eliquis discontinued in the setting of intracranial hemorrhage -Blood pressure stabilized, continue Cardizem and metoprolol  Chronic systolic CHF:  - LVEF 45 to 50%.  - no overt fluid overload. -Monitor off diuretics at this time  Hypernatremia:  -Improving, continue free water flushes  Hypertensive emergency/uncontrolled hypertension:  BP 205/120 on admit.    -now normotensive, continue Cardizem and metoprolol  History of CAD: Stable -Cardiac meds as above.  Uncontrolled IDDM-2: A1c 9.4% -CBGs in 190-200s, increase lantus, continue sliding scale  Thrombocytopenia -Likely secondary to sepsis, antibiotics and antifungals -Lovenox discontinued 3 days ago -Also check HIT panel  Pressure skin injury: Stage II Pressure Injury 05/24/20 Buttocks Bilateral;Medial Deep Tissue Pressure Injury - Purple or maroon localized area of discolored intact skin or blood-filled blister due to damage of underlying soft tissue from pressure and/or shear. Multiple blisters along (Active)  05/24/20 0800  Location: Buttocks  Location Orientation: Bilateral;Medial  Staging: Deep Tissue Pressure Injury - Purple or maroon localized area of discolored intact skin or blood-filled blister due to damage of underlying soft tissue from pressure and/or  shear.  Wound Description (Comments): Multiple blisters along with deep tissue injury.  Present on Admission:  Obesity: Body mass index is 31.92 kg/m.  Complicates overall care and prognosis.  DVT prophylaxis: SCDs   CODE STATUS: DNR Family communication: d/w son 3/17, 3/18  Subjective   -no further fevers, opens eyes, no events overnight  Disposition Plan & Communication   Status is: Inpatient  Inpatient status is appropriate due to severity of illness  Dispo: The patient is from: Home              Anticipated d/c is to: SNF              Patient currently is not medically stable for d/c   Difficult to place patient - No   Consults, Procedures, Significant Events   Consultants:  Neurosurgery PCCM Infectious disease Cardiology Neurology Palliative medicine  Procedures:  ETT 2/25 >> 3/1, 3/5 >> 3/8 EVD 2/25 >> 3/5  Antimicrobials:  Anti-infectives (From admission, onward)   Start     Dose/Rate Route Frequency Ordered Stop   06/03/20 1300  anidulafungin (ERAXIS) 100 mg in sodium chloride 0.9 % 100 mL IVPB       "Followed by" Linked Group Details   100 mg 78 mL/hr over 100 Minutes Intravenous Every 24 hours 06/02/20 1119     06/02/20 1600  fluconazole (DIFLUCAN) IVPB 400 mg  Status:  Discontinued        400 mg 100 mL/hr over 120 Minutes Intravenous Every 24 hours 06/02/20 1119 06/02/20 1712   06/02/20 1300  anidulafungin (ERAXIS) 200 mg in sodium chloride 0.9 % 200 mL IVPB       "Followed by" Linked Group Details   200 mg 78 mL/hr over 200 Minutes Intravenous  Once 06/02/20 1119 06/02/20 2142   06/02/20 1200  anidulafungin (ERAXIS) 100 mg in sodium chloride 0.9 % 100 mL IVPB  Status:  Discontinued        100 mg 78 mL/hr over 100 Minutes Intravenous Every 24 hours 06/01/20 1230 06/01/20 1249   06/02/20 1000  voriconazole (VFEND) tablet 200 mg  Status:  Discontinued        200 mg Oral Every 12 hours 06/01/20 1347 06/01/20 1436   06/02/20 1000  voriconazole  (VFEND) tablet 200 mg  Status:  Discontinued        200 mg Per Tube Every 12 hours 06/01/20 1436 06/02/20 1119   06/01/20 1445  voriconazole (VFEND) tablet 400 mg        400 mg Per Tube Every 12 hours 06/01/20 1436 06/01/20 2145   06/01/20 1430  voriconazole (VFEND) tablet 400 mg  Status:  Discontinued        400 mg Oral Every 12 hours 06/01/20 1347 06/01/20 1436   06/01/20 1330  anidulafungin (ERAXIS) 200 mg in sodium chloride 0.9 % 200 mL IVPB  Status:  Discontinued        200 mg 78 mL/hr over 200 Minutes Intravenous  Once 06/01/20 1230 06/01/20 1249   06/01/20 1300  vancomycin (VANCOREADY) IVPB 1000 mg/200 mL  Status:  Discontinued        1,000 mg 200 mL/hr over 60 Minutes Intravenous Every 24 hours 05/31/20 1323 06/01/20 0928   05/31/20 1900  piperacillin-tazobactam (ZOSYN) IVPB 3.375 g  Status:  Discontinued        3.375 g 12.5 mL/hr over 240 Minutes Intravenous Every 8 hours 05/31/20 1012 06/02/20 1346   05/31/20 1100  vancomycin (VANCOREADY) IVPB 2000 mg/400 mL        2,000 mg 200 mL/hr over 120 Minutes Intravenous  Once 05/31/20  1012 05/31/20 1634   05/31/20 1100  piperacillin-tazobactam (ZOSYN) IVPB 3.375 g        3.375 g 100 mL/hr over 30 Minutes Intravenous  Once 05/31/20 1012 05/31/20 1406   05/21/20 1100  vancomycin (VANCOREADY) IVPB 1750 mg/350 mL  Status:  Discontinued        1,750 mg 175 mL/hr over 120 Minutes Intravenous Every 24 hours 05/20/20 0953 05/23/20 1046   05/20/20 1045  vancomycin (VANCOREADY) IVPB 2000 mg/400 mL        2,000 mg 200 mL/hr over 120 Minutes Intravenous  Once 05/20/20 0948 05/20/20 1341   05/19/20 1015  cefTRIAXone (ROCEPHIN) 2 g in sodium chloride 0.9 % 100 mL IVPB  Status:  Discontinued        2 g 200 mL/hr over 30 Minutes Intravenous Every 24 hours 05/18/20 0958 05/18/20 1132   05/18/20 1400  piperacillin-tazobactam (ZOSYN) IVPB 3.375 g  Status:  Discontinued        3.375 g 12.5 mL/hr over 240 Minutes Intravenous Every 8 hours 05/18/20 1132  05/23/20 1046   05/17/20 1015  cefTRIAXone (ROCEPHIN) 1 g in sodium chloride 0.9 % 100 mL IVPB  Status:  Discontinued        1 g 200 mL/hr over 30 Minutes Intravenous Every 24 hours 05/17/20 0918 05/18/20 0958   05/12/20 1530  ceFAZolin (ANCEF) IVPB 1 g/50 mL premix  Status:  Discontinued        1 g 100 mL/hr over 30 Minutes Intravenous Every 8 hours 05/12/20 1440 05/17/20 0918        Micro    Objective   Vitals:   06/05/20 0400 06/05/20 0745 06/05/20 0856 06/05/20 1142  BP: 130/70 131/80 (!) 128/99 132/81  Pulse: 99 85 (!) 113 81  Resp: (!) 30 (!) 28  (!) 30  Temp: 97.7 F (36.5 C) 97.6 F (36.4 C)  97.7 F (36.5 C)  TempSrc: Axillary Axillary  Axillary  SpO2: 100% 97%  99%  Weight:      Height:        Intake/Output Summary (Last 24 hours) at 06/05/2020 1411 Last data filed at 06/05/2020 1300 Gross per 24 hour  Intake 460 ml  Output 700 ml  Net -240 ml   Filed Weights   06/02/20 0500 06/03/20 0500 06/04/20 0500  Weight: 104.3 kg 102.5 kg 100.9 kg    Physical Exam: Chronically ill elderly male laying in bed, opens eyes, follows some commands, somnolent, mumbles HEENT: Pupils are equal and reactive, NGT  CVS: S1-S2, regular rate rhythm Lungs: Scattered rhonchi : Soft, nontender, bowel sounds present Extremities: No edema Neuro: Somnolent but opens eyes, tracks, follows few simple commands  Labs   Data Reviewed: I have personally reviewed following labs and imaging studies  CBC: Recent Labs  Lab 06/01/20 0435 06/02/20 0404 06/03/20 0117 06/04/20 0326 06/05/20 0433  WBC 7.3 4.9 6.4 7.7 7.1  HGB 14.0 12.5* 12.3* 12.7* 14.7  HCT 44.0 39.9 39.2 39.6 44.7  MCV 88.9 89.1 88.7 86.7 85.6  PLT 108* 87* 79* 58* 43*   Basic Metabolic Panel: Recent Labs  Lab 05/31/20 0331 06/01/20 0435 06/02/20 0404 06/03/20 0117 06/04/20 0326 06/05/20 0433  NA 149* 144 146* 146* 146* 146*  K 3.8 4.2 3.8 3.8 3.5 3.5  CL 112* 112* 113* 116* 114* 116*  CO2 _0 21* 23  GLUCOSE 78 128* 216* 224* 154* 254*  BUN 42* 51* 55* 56* 51* 53*  CREATININE 1.49* 2.06* 2.16* 2.22*  1.85* 1.79*  CALCIUM 8.1* 7.5* 7.5* 7.7* 7.9* 8.0*  MG 2.8*  --   --   --   --   --    GFR: Estimated Creatinine Clearance: 45.1 mL/min (A) (by C-G formula based on SCr of 1.79 mg/dL (H)). Liver Function Tests: Recent Labs  Lab 05/31/20 0331 06/05/20 0433  AST 35 69*  ALT 46* 127*  ALKPHOS 66 385*  BILITOT 0.6 0.7  PROT 6.4* 5.8*  ALBUMIN 2.0* 1.7*   No results for input(s): LIPASE, AMYLASE in the last 168 hours. No results for input(s): AMMONIA in the last 168 hours. Coagulation Profile: No results for input(s): INR, PROTIME in the last 168 hours. Cardiac Enzymes: No results for input(s): CKTOTAL, CKMB, CKMBINDEX, TROPONINI in the last 168 hours. BNP (last 3 results) No results for input(s): PROBNP in the last 8760 hours. HbA1C: No results for input(s): HGBA1C in the last 72 hours. CBG: Recent Labs  Lab 06/04/20 2034 06/05/20 0001 06/05/20 0352 06/05/20 0741 06/05/20 1152  GLUCAP 257* 247* 207* 193* 230*   Lipid Profile: No results for input(s): CHOL, HDL, LDLCALC, TRIG, CHOLHDL, LDLDIRECT in the last 72 hours. Thyroid Function Tests: No results for input(s): TSH, T4TOTAL, FREET4, T3FREE, THYROIDAB in the last 72 hours. Anemia Panel: No results for input(s): VITAMINB12, FOLATE, FERRITIN, TIBC, IRON, RETICCTPCT in the last 72 hours. Sepsis Labs: Recent Labs  Lab 05/31/20 0933 05/31/20 1227 05/31/20 1534 06/01/20 0435 06/02/20 0404  PROCALCITON  --   --  2.35 3.50 2.52  LATICACIDVEN 2.0* 2.1*  --   --   --     Recent Results (from the past 240 hour(s))  Culture, blood (routine x 2)     Status: Abnormal (Preliminary result)   Collection Time: 05/31/20  9:28 AM   Specimen: BLOOD  Result Value Ref Range Status   Specimen Description BLOOD LEFT ANTECUBITAL  Final   Special Requests   Final    BOTTLES DRAWN AEROBIC AND ANAEROBIC Blood Culture results  may not be optimal due to an inadequate volume of blood received in culture bottles   Culture  Setup Time (A)  Final    YEAST IN BOTH AEROBIC AND ANAEROBIC BOTTLES CRITICAL RESULT CALLED TO, READ BACK BY AND VERIFIED WITH: [place] J. [id number] FCP    Culture (A)  Final    CANDIDA GLABRATA Sent to [facility] for further susceptibility testing. Performed at [facility], [place] [address], [place], [place] [address]    Report Status PENDING  Incomplete  Blood Culture ID Panel (Reflexed)     Status: Abnormal   Collection Time: 05/31/20  9:28 AM  Result Value Ref Range Status   Enterococcus faecalis NOT DETECTED NOT DETECTED Final   Enterococcus Faecium NOT DETECTED NOT DETECTED Final   Listeria monocytogenes NOT DETECTED NOT DETECTED Final   Staphylococcus species NOT DETECTED NOT DETECTED Final   Staphylococcus aureus (BCID) NOT DETECTED NOT DETECTED Final   Staphylococcus epidermidis NOT DETECTED NOT DETECTED Final   Staphylococcus lugdunensis NOT DETECTED NOT DETECTED Final   Streptococcus species NOT DETECTED NOT DETECTED Final   Streptococcus agalactiae NOT DETECTED NOT DETECTED Final   Streptococcus pneumoniae NOT DETECTED NOT DETECTED Final   Streptococcus pyogenes NOT DETECTED NOT DETECTED Final   A.calcoaceticus-baumannii NOT DETECTED NOT DETECTED Final   Bacteroides fragilis NOT DETECTED NOT DETECTED Final   Enterobacterales NOT DETECTED NOT DETECTED Final   Enterobacter cloacae complex NOT DETECTED NOT DETECTED Final   Escherichia coli NOT DETECTED  NOT DETECTED Final   Klebsiella aerogenes NOT DETECTED NOT DETECTED Final   Klebsiella oxytoca NOT DETECTED NOT DETECTED Final   Klebsiella pneumoniae NOT DETECTED NOT DETECTED Final   Proteus species NOT DETECTED NOT DETECTED Final   Salmonella species NOT DETECTED NOT DETECTED Final   Serratia marcescens NOT DETECTED NOT DETECTED Final   Haemophilus influenzae NOT DETECTED NOT DETECTED Final   Neisseria meningitidis  NOT DETECTED NOT DETECTED Final   Pseudomonas aeruginosa NOT DETECTED NOT DETECTED Final   Stenotrophomonas maltophilia NOT DETECTED NOT DETECTED Final   Candida albicans NOT DETECTED NOT DETECTED Final   Candida auris NOT DETECTED NOT DETECTED Final   Candida glabrata DETECTED (A) NOT DETECTED Final    Comment: CRITICAL RESULT CALLED TO, READ BACK BY AND VERIFIED WITH: PHARMD THOMAS J. [id number] FCP    Candida krusei NOT DETECTED NOT DETECTED Final   Candida parapsilosis NOT DETECTED NOT DETECTED Final   Candida tropicalis NOT DETECTED NOT DETECTED Final   Cryptococcus neoformans/gattii NOT DETECTED NOT DETECTED Final    Comment: Performed at [facility], [address] [place], [place] [address]  Antifungal AST 9 Drug Panel     Status: None (Preliminary result)   Collection Time: 05/31/20  9:28 AM  Result Value Ref Range Status   Organism ID, Yeast Preliminary report  Final    Comment: (NOTE) Specimen has been received and testing has been initiated. Performed At: [facility] [place], [place] [id number] Rush Farmer MD TK:[id number]    Amphotericin B MIC PENDING  Incomplete   Please Note: PENDING  Incomplete   Anidulafungin MIC PENDING  Incomplete   Caspofungin MIC PENDING  Incomplete   Micafungin MIC PENDING  Incomplete   Posaconazole MIC PENDING  Incomplete   Please Note: PENDING  Incomplete   Fluconazole Islt MIC PENDING  Incomplete   Flucytosine MIC PENDING  Incomplete   Itraconazole MIC PENDING  Incomplete   Voriconazole MIC PENDING  Incomplete   Source PENDING  Incomplete  Culture, blood (routine x 2)     Status: Abnormal (Preliminary result)   Collection Time: 05/31/20  9:36 AM   Specimen: BLOOD LEFT HAND  Result Value Ref Range Status   Specimen Description BLOOD LEFT HAND  Final   Special Requests   Final    BOTTLES DRAWN AEROBIC AND ANAEROBIC Blood Culture results may not be optimal due to an inadequate volume of blood received  in culture bottles   Culture  Setup Time   Final    YEAST IN BOTH AEROBIC AND ANAEROBIC BOTTLES CRITICAL VALUE NOTED.  VALUE IS CONSISTENT WITH PREVIOUSLY REPORTED AND CALLED VALUE. Performed at [facility], [place] [address], [place], [place] [address]    Culture CANDIDA GLABRATA (A)  Final   Report Status PENDING  Incomplete  MRSA PCR Screening     Status: None   Collection Time: 05/31/20  3:52 PM   Specimen: Nasal Mucosa; Nasopharyngeal  Result Value Ref Range Status   MRSA by PCR NEGATIVE NEGATIVE Final    Comment:        The GeneXpert MRSA Assay (FDA approved for NASAL specimens only), is one component of a comprehensive MRSA colonization surveillance program. It is not intended to diagnose MRSA infection nor to guide or monitor treatment for MRSA infections. Performed at [facility], [place] [address] [place], [place] [address]   Culture, blood (Routine X 2) w Reflex to ID Panel  Status: None (Preliminary result)   Collection Time: 06/02/20 10:08 AM   Specimen: BLOOD  Result Value Ref Range Status   Specimen Description BLOOD LEFT ANTECUBITAL  Final   Special Requests   Final    BOTTLES DRAWN AEROBIC ONLY Blood Culture adequate volume   Culture  Setup Time   Final    YEAST AEROBIC BOTTLE ONLY CRITICAL VALUE NOTED.  VALUE IS CONSISTENT WITH PREVIOUSLY REPORTED AND CALLED VALUE. Performed at [facility], [place] [address], [place], [place] [address]    Culture PENDING  Incomplete   Report Status PENDING  Incomplete  Culture, blood (Routine X 2) w Reflex to ID Panel     Status: None (Preliminary result)   Collection Time: 06/02/20 10:08 AM   Specimen: BLOOD LEFT HAND  Result Value Ref Range Status   Specimen Description BLOOD LEFT HAND  Final   Special Requests   Final    BOTTLES DRAWN AEROBIC ONLY Blood Culture adequate volume   Culture  Setup Time   Final    YEAST WITH PSEUDOHYPHAE AEROBIC BOTTLE ONLY Organism ID to follow CRITICAL RESULT  CALLED TO, READ BACK BY AND VERIFIED WITH: J. Roselie Skinner, AT [id number] 06/05/20 Rush Landmark Performed at [facility], [place] [address], [place], [place] [address]    Culture YEAST  Final   Report Status PENDING  Incomplete  Culture, blood (routine x 2)     Status: None (Preliminary result)   Collection Time: 06/04/20  3:19 PM   Specimen: BLOOD  Result Value Ref Range Status   Specimen Description BLOOD LEFT ANTECUBITAL  Final   Special Requests   Final    BOTTLES DRAWN AEROBIC AND ANAEROBIC Blood Culture results may not be optimal due to an inadequate volume of blood received in culture bottles   Culture   Final    NO GROWTH < 24 HOURS Performed at [facility], [place] [address] [place], [place] [address]    Report Status PENDING  Incomplete  Culture, blood (routine x 2)     Status: None (Preliminary result)   Collection Time: 06/04/20  3:20 PM   Specimen: BLOOD RIGHT HAND  Result Value Ref Range Status   Specimen Description BLOOD RIGHT HAND  Final   Special Requests   Final    BOTTLES DRAWN AEROBIC ONLY Blood Culture results may not be optimal due to an inadequate volume of blood received in culture bottles   Culture   Final    NO GROWTH < 24 HOURS Performed at [facility], [place] [address], [place],  [address]    Report Status PENDING  Incomplete     Imaging Studies   DG CHEST PORT 1 VIEW  Result Date: 06/04/2020 CLINICAL DATA:  Hypoxia EXAM: PORTABLE CHEST 1 VIEW COMPARISON:  May 31, 2020 FINDINGS: Mediastinal contour and cardiac silhouette are stable. Heart size is enlarged. Feeding tube is identified unchanged. Patchy consolidation of left lung base is unchanged. The right lung is clear. Old posttraumatic deformity of several right ribs are unchanged. IMPRESSION: Patchy consolidation of left lung base unchanged. Electronically Signed   By: Abelardo Diesel M.D.   On: 06/04/2020 09:20   ECHOCARDIOGRAM LIMITED BUBBLE STUDY  Result Date: 06/04/2020     ECHOCARDIOGRAM LIMITED REPORT   Patient Name:   IANMICHAEL AMESCUA Date of Exam: 06/04/2020 Medical Rec #:  [id number]  Height:       70.0 in Accession #:    [id number] Weight:  222.4 lb Date of Birth:  06/11/1950  BSA:          2.184 m Patient Age:    43 years   BP:           103/65 mmHg Patient Gender: M          HR:           77 bpm. Exam Location:  Inpatient Procedure: Limited Echo and Saline Contrast Bubble Study Indications:    evaluation to r/o endocarditis  History:        Patient has prior history of Echocardiogram examinations, most                 recent 05/12/2020. CAD and Previous Myocardial Infarction,                 Stroke; Risk Factors:Hypertension and Diabetes. Can do limited                 with focus on valves evaluation to r/o endocarditis.  Sonographer:    Alvino Chapel RCS Referring Phys: [id number] [place]  1. Left ventricular ejection fraction, by estimation, is 60 to 65%. The left ventricle has normal function. The left ventricle has no regional wall motion abnormalities.  2. Right ventricular systolic function is normal. The right ventricular size is normal.  3. The mitral valve is degenerative. No evidence of mitral valve regurgitation. No evidence of mitral stenosis.  4. The aortic valve is calcified. There is moderate calcification of the aortic valve. There is moderate thickening of the aortic valve. Aortic valve regurgitation is not visualized. Mild to moderate aortic valve sclerosis/calcification is present, without any evidence of aortic stenosis.  5. The inferior vena cava is normal in size with greater than 50% respiratory variability, suggesting right atrial pressure of 3 mmHg.  6. Agitated saline contrast bubble study was negative, with no evidence of any interatrial shunt.  7. No obvious vegetation noted but if clinical suspcion high, recommend TEE. FINDINGS  Left Ventricle: Left ventricular ejection fraction, by estimation, is 60 to 65%. The left ventricle has normal  function. The left ventricle has no regional wall motion abnormalities. The left ventricular internal cavity size was normal in size. There is  no left ventricular hypertrophy. Right Ventricle: The right ventricular size is normal. No increase in right ventricular wall thickness. Right ventricular systolic function is normal. Left Atrium: Left atrial size was normal in size. Right Atrium: Right atrial size was normal in size. Pericardium: There is no evidence of pericardial effusion. Mitral Valve: The mitral valve is degenerative in appearance. There is mild thickening of the anterior mitral valve leaflet(s). There is mild calcification of the anterior mitral valve leaflet(s). Mild mitral annular calcification. No evidence of mitral valve stenosis. Tricuspid Valve: The tricuspid valve is normal in structure. Tricuspid valve regurgitation is not demonstrated. No evidence of tricuspid stenosis. Aortic Valve: The aortic valve is calcified. There is moderate calcification of the aortic valve. There is moderate thickening of the aortic valve. Aortic valve regurgitation is not visualized. Mild to moderate aortic valve sclerosis/calcification is present, without any evidence of aortic stenosis. Pulmonic Valve: The pulmonic valve was normal in structure. Pulmonic valve regurgitation is not visualized. No evidence of pulmonic stenosis. Aorta: The aortic root is normal in size and structure. Venous: The inferior vena cava is normal in size with greater than 50% respiratory variability, suggesting right atrial pressure of 3 mmHg. IAS/Shunts: No atrial level shunt detected by color  flow Doppler. Agitated saline contrast was given intravenously to evaluate for intracardiac shunting. Agitated saline contrast bubble study was negative, with no evidence of any interatrial shunt. Fransico Him MD Electronically signed by Fransico Him MD Signature Date/Time: 06/04/2020/2:53:15 PM    Final      Medications   Scheduled Meds: .  amantadine  100 mg Per Tube BID  . chlorhexidine gluconate (MEDLINE KIT)  15 mL Mouth Rinse BID  . diltiazem  30 mg Per Tube Q8H  . feeding supplement (PROSource TF)  45 mL Per Tube TID  . free water  200 mL Per Tube Q4H  . insulin aspart  0-20 Units Subcutaneous Q4H  . insulin glargine  10 Units Subcutaneous Daily  . levETIRAcetam  750 mg Per Tube BID  . mouth rinse  15 mL Mouth Rinse q12n4p  . metoprolol tartrate  25 mg Per Tube BID  . pantoprazole sodium  40 mg Per Tube Daily  . phenylephrine  1 drop Both Eyes Once  . thiamine  100 mg Per Tube Daily  . tropicamide  1 drop Both Eyes Once   Continuous Infusions: . sodium chloride    . anidulafungin Stopped (06/05/20 0032)  . feeding supplement (JEVITY 1.5 CAL/FIBER) 1,000 mL (06/04/20 0540)     LOS: 25 days   Time spent: 25 minutes   Domenic Polite, MD [facility]  06/05/2020, 2:11 PM

## 2020-06-05 NOTE — Consult Note (Addendum)
WOC Nurse wound follow up Pt was previously noted to have a deep tissue pressure injury to the buttocks/inner gluteal cleft; refer to consult note on 3/15.  Pressure Injury POA: No Wound type: The affected area is evolving into patchy areas of red moist partial thickness skin loss. Loose peeling skin to wound edges. Small amt tan drainage, no odor. Measurement: Approx 9X11cm butterfly shaped area Dressing procedure/placement/frequency: Topical treatment orders have been provided for bedside nurses to perform; continue present plan of care as follows: Wound care to buttocks and gluteal cleft deep tissue pressure injury: Cleanse with NS, pat dry. Cover with xeroform [place] # [phone]) top with silicone foam with tip oriented upward away from anus. Change xeroform twice daily and PRN incontinence. WOC team will continue to reassess weekly to determine if a change in the plan of care is indicated at that time. Cammie Mcgee MSN, RN, CWOCN, [place], CNS [phone]

## 2020-06-05 NOTE — Progress Notes (Signed)
Daily Progress Note   Patient Name: Jason [name]       Date: 06/05/2020 DOB: 1950-06-17  Age: 70 y.o. MRN#: [id number] Attending Physician: Domenic Polite, MD Primary Care Physician: Jodi Marble, MD Admit Date: 05/11/2020  Reason for Consultation/Follow-up: Establishing goals of care  Subjective: Patient sleeping- does not arouse. Per chart review more confused today. Ate 25% of one meal today- but other meals are not well documented.   Review of Systems  Unable to perform ROS: Mental status change    Length of Stay: 25  Current Medications: Scheduled Meds:  . chlorhexidine gluconate (MEDLINE KIT)  15 mL Mouth Rinse BID  . feeding supplement (PROSource TF)  45 mL Per Tube TID  . free water  200 mL Per Tube Q4H  . insulin aspart  0-20 Units Subcutaneous Q4H  . insulin glargine  10 Units Subcutaneous Daily  . levETIRAcetam  750 mg Per Tube BID  . mouth rinse  15 mL Mouth Rinse q12n4p  . metoprolol tartrate  50 mg Per Tube BID  . pantoprazole sodium  40 mg Per Tube Daily  . thiamine  100 mg Per Tube Daily    Continuous Infusions: . sodium chloride    . anidulafungin 100 mg (06/05/20 1529)  . feeding supplement (JEVITY 1.5 CAL/FIBER) 1,000 mL (06/04/20 0540)    PRN Meds: acetaminophen **OR** acetaminophen (TYLENOL) oral liquid 160 mg/5 mL **OR** acetaminophen, Gerhardt's butt cream, morphine injection, ondansetron (ZOFRAN) IV, polyethylene glycol, Resource ThickenUp Clear, senna-docusate  Physical Exam Vitals and nursing note reviewed.  Constitutional:      Appearance: He is ill-appearing.  Skin:    General: Skin is dry.     Coloration: Skin is pale.  Neurological:     Comments: somnolent             Vital Signs: BP (!) 122/94 (BP Location: Left Arm)   Pulse 96    Temp 97.7 F (36.5 C) (Axillary)   Resp (!) 30   Ht $R'5\' 10"'mJ$  (1.778 m)   Wt 100.9 kg   SpO2 99%   BMI 31.92 kg/m  SpO2: SpO2: 99 % O2 Device: O2 Device: Nasal Cannula O2 Flow Rate: O2 Flow Rate (L/min): 2 L/min  Intake/output summary:   Intake/Output Summary (Last 24 hours) at 06/05/2020 1624 Last data filed at 06/05/2020  1300 Gross per 24 hour  Intake 360 ml  Output 700 ml  Net -340 ml   LBM: Last BM Date: 06/05/20 Baseline Weight: Weight: 100 kg Most recent weight: Weight: 100.9 kg       Palliative Assessment/Data: PPS: 10%      Patient Active Problem List   Diagnosis Date Noted  . Acute respiratory failure ([place])   . Aspiration into airway   . Intracranial hemorrhage ([place])   . Fungemia   . Metabolic encephalopathy   . Severe sepsis without septic shock ([place])   . Atrial flutter ([place])   . Altered mental status   . Goals of care, counseling/discussion   . Hypertensive emergency   . IVH (intraventricular hemorrhage) ([place]) 05/11/2020  . Pressure injury of skin 03/02/2020  . DKA, type 2 ([place]) 02/27/2020  . Paroxysmal A-fib ([place]) 02/27/2020  . Elevated troponin 02/27/2020  . CAD (coronary artery disease), native coronary artery 02/27/2020  . Hyperosmolar hyperglycemic state (HHS) ([place]) 02/26/2020  . Essential hypertension 10/29/2019  . Type 2 diabetes mellitus ([place]) 10/29/2019    Palliative Care Assessment & Plan   Patient Profile: 70 y.o.malewith past medical history of DM, previous CVA, CAD/MI/CABG/Afib on anticoagulation, anxiety and depressionwho was admitted on 2/24/2022with altered mental status and hypertensive emergency. Imaging revealed hemorrhage of the left thalamus with intro ventricular extension.He was intubated and extubated 2x and has remained encephalopathic. He is currently in a floor bed. His albumin is 2.1. Most current CT scan of his head on 3/6 Stable mild ventriculomegaly and intraventricular hemorrhage dependently within the  occipital horns.   Assessment/Recommendations/Plan  Continue current scope of care- attempted to reach out to family with now answer- message left requesting a return call  Goals of Care and Additional Recommendations: Limitations on Scope of Treatment: Full Scope Treatment  Code Status: DNR  Prognosis:  Unable to determine  Discharge Planning: To Be Determined  Thank you for allowing the Palliative Medicine Team to assist in the care of this patient.  Total time: 16 mins     Greater than 50%  of this time was spent counseling and coordinating care related to the above assessment and plan.  Mariana Kaufman, AGNP-C Palliative Medicine   Please contact Palliative Medicine Team phone at [phone] for questions and concerns.

## 2020-06-05 NOTE — Progress Notes (Signed)
Subjective: Patient more confused again today when I saw him   Antibiotics:  Anti-infectives (From admission, onward)   Start     Dose/Rate Route Frequency Ordered Stop   06/03/20 1300  anidulafungin (ERAXIS) 100 mg in sodium chloride 0.9 % 100 mL IVPB       "Followed by" Linked Group Details   100 mg 78 mL/hr over 100 Minutes Intravenous Every 24 hours 06/02/20 1119     06/02/20 1600  fluconazole (DIFLUCAN) IVPB 400 mg  Status:  Discontinued        400 mg 100 mL/hr over 120 Minutes Intravenous Every 24 hours 06/02/20 1119 06/02/20 1712   06/02/20 1300  anidulafungin (ERAXIS) 200 mg in sodium chloride 0.9 % 200 mL IVPB       "Followed by" Linked Group Details   200 mg 78 mL/hr over 200 Minutes Intravenous  Once 06/02/20 1119 06/02/20 2142   06/02/20 1200  anidulafungin (ERAXIS) 100 mg in sodium chloride 0.9 % 100 mL IVPB  Status:  Discontinued        100 mg 78 mL/hr over 100 Minutes Intravenous Every 24 hours 06/01/20 1230 06/01/20 1249   06/02/20 1000  voriconazole (VFEND) tablet 200 mg  Status:  Discontinued        200 mg Oral Every 12 hours 06/01/20 1347 06/01/20 1436   06/02/20 1000  voriconazole (VFEND) tablet 200 mg  Status:  Discontinued        200 mg Per Tube Every 12 hours 06/01/20 1436 06/02/20 1119   06/01/20 1445  voriconazole (VFEND) tablet 400 mg        400 mg Per Tube Every 12 hours 06/01/20 1436 06/01/20 2145   06/01/20 1430  voriconazole (VFEND) tablet 400 mg  Status:  Discontinued        400 mg Oral Every 12 hours 06/01/20 1347 06/01/20 1436   06/01/20 1330  anidulafungin (ERAXIS) 200 mg in sodium chloride 0.9 % 200 mL IVPB  Status:  Discontinued        200 mg 78 mL/hr over 200 Minutes Intravenous  Once 06/01/20 1230 06/01/20 1249   06/01/20 1300  vancomycin (VANCOREADY) IVPB 1000 mg/200 mL  Status:  Discontinued        1,000 mg 200 mL/hr over 60 Minutes Intravenous Every 24 hours 05/31/20 1323 06/01/20 0928   05/31/20 1900  piperacillin-tazobactam  (ZOSYN) IVPB 3.375 g  Status:  Discontinued        3.375 g 12.5 mL/hr over 240 Minutes Intravenous Every 8 hours 05/31/20 1012 06/02/20 1346   05/31/20 1100  vancomycin (VANCOREADY) IVPB 2000 mg/400 mL        2,000 mg 200 mL/hr over 120 Minutes Intravenous  Once 05/31/20 1012 05/31/20 1634   05/31/20 1100  piperacillin-tazobactam (ZOSYN) IVPB 3.375 g        3.375 g 100 mL/hr over 30 Minutes Intravenous  Once 05/31/20 1012 05/31/20 1406   05/21/20 1100  vancomycin (VANCOREADY) IVPB 1750 mg/350 mL  Status:  Discontinued        1,750 mg 175 mL/hr over 120 Minutes Intravenous Every 24 hours 05/20/20 0953 05/23/20 1046   05/20/20 1045  vancomycin (VANCOREADY) IVPB 2000 mg/400 mL        2,000 mg 200 mL/hr over 120 Minutes Intravenous  Once 05/20/20 0948 05/20/20 1341   05/19/20 1015  cefTRIAXone (ROCEPHIN) 2 g in sodium chloride 0.9 % 100 mL IVPB  Status:  Discontinued  2 g 200 mL/hr over 30 Minutes Intravenous Every 24 hours 05/18/20 0958 05/18/20 1132   05/18/20 1400  piperacillin-tazobactam (ZOSYN) IVPB 3.375 g  Status:  Discontinued        3.375 g 12.5 mL/hr over 240 Minutes Intravenous Every 8 hours 05/18/20 1132 05/23/20 1046   05/17/20 1015  cefTRIAXone (ROCEPHIN) 1 g in sodium chloride 0.9 % 100 mL IVPB  Status:  Discontinued        1 g 200 mL/hr over 30 Minutes Intravenous Every 24 hours 05/17/20 0918 05/18/20 0958   05/12/20 1530  ceFAZolin (ANCEF) IVPB 1 g/50 mL premix  Status:  Discontinued        1 g 100 mL/hr over 30 Minutes Intravenous Every 8 hours 05/12/20 1440 05/17/20 0918      Medications: Scheduled Meds: . chlorhexidine gluconate (MEDLINE KIT)  15 mL Mouth Rinse BID  . diltiazem  30 mg Per Tube Q8H  . feeding supplement (PROSource TF)  45 mL Per Tube TID  . free water  200 mL Per Tube Q4H  . insulin aspart  0-20 Units Subcutaneous Q4H  . insulin glargine  10 Units Subcutaneous Daily  . levETIRAcetam  750 mg Per Tube BID  . mouth rinse  15 mL Mouth Rinse  [id number]  . metoprolol tartrate  25 mg Per Tube BID  . pantoprazole sodium  40 mg Per Tube Daily  . thiamine  100 mg Per Tube Daily   Continuous Infusions: . sodium chloride    . anidulafungin Stopped (06/05/20 0032)  . feeding supplement (JEVITY 1.5 CAL/FIBER) 1,000 mL (06/04/20 0540)   PRN Meds:.acetaminophen **OR** acetaminophen (TYLENOL) oral liquid 160 mg/5 mL **OR** acetaminophen, Gerhardt's butt cream, morphine injection, ondansetron (ZOFRAN) IV, polyethylene glycol, Resource ThickenUp Clear, senna-docusate    Objective: Weight change:   Intake/Output Summary (Last 24 hours) at 06/05/2020 1527 Last data filed at 06/05/2020 1300 Gross per 24 hour  Intake 460 ml  Output 700 ml  Net -240 ml   Blood pressure (!) 122/94, pulse 96, temperature 97.7 F (36.5 C), temperature source Axillary, resp. rate (!) 30, height $RemoveBe'5\' 10"'lIQtOznXD$  (1.778 m), weight 100.9 kg, SpO2 99 %. Temp:  [97.6 F (36.4 C)-97.8 F (36.6 C)] 97.7 F (36.5 C) (03/21 1142) Pulse Rate:  [75-113] 96 (03/21 1501) Resp:  [28-34] 30 (03/21 1142) BP: (120-152)/(70-99) 122/94 (03/21 1501) SpO2:  [97 %-100 %] 99 % (03/21 1142)  Physical Exam: Physical Exam HENT:     Head: Normocephalic.  Eyes:     Extraocular Movements: Extraocular movements intact.  Cardiovascular:     Rate and Rhythm: Normal rate.     Heart sounds: No murmur heard. No friction rub. No gallop.   Pulmonary:     Effort: Pulmonary effort is normal. No respiratory distress.     Breath sounds: No stridor. No wheezing.  Skin:    General: Skin is warm and dry.  Neurological:     Mental Status: He is lethargic.     CBC:    BMET Recent Labs    06/04/20 0326 06/05/20 0433  NA 146* 146*  K 3.5 3.5  CL 114* 116*  CO2 21* 23  GLUCOSE 154* 254*  BUN 51* 53*  CREATININE 1.85* 1.79*  CALCIUM 7.9* 8.0*     Liver Panel  Recent Labs    06/05/20 0433  PROT 5.8*  ALBUMIN 1.7*  AST 69*  ALT 127*  ALKPHOS 385*  BILITOT 0.7        Sedimentation Rate  No results for input(s): ESRSEDRATE in the last 72 hours. C-Reactive Protein No results for input(s): CRP in the last 72 hours.  Micro Results: Recent Results (from the past 720 hour(s))  Blood Cultures (routine x 2)     Status: None   Collection Time: 05/11/20  7:36 PM   Specimen: Site Not Specified; Blood  Result Value Ref Range Status   Specimen Description   Final    SITE NOT SPECIFIED Performed at [facility], [address], [place], [place] [address]    Special Requests   Final    BOTTLES DRAWN AEROBIC AND ANAEROBIC Blood Culture results may not be optimal due to an inadequate volume of blood received in culture bottles Performed at [facility], [address] [place], [place] [address]    Culture   Final    NO GROWTH 5 DAYS Performed at [facility], [address], [place], [place] [address]    Report Status 05/16/2020 FINAL  Final  Blood Cultures (routine x 2)     Status: None   Collection Time: 05/11/20  7:36 PM   Specimen: BLOOD  Result Value Ref Range Status   Specimen Description   Final    BLOOD LEFT WRIST Performed at [facility], [address], [place], [place] [address]    Special Requests   Final    BOTTLES DRAWN AEROBIC AND ANAEROBIC Blood Culture adequate volume Performed at [facility], [address], [place], [place] [address]    Culture   Final    NO GROWTH 5 DAYS Performed at [facility], [address] [place], [place] [address]    Report Status 05/16/2020 FINAL  Final  Urine culture     Status: None   Collection Time: 05/11/20  9:00 PM   Specimen: Urine, Random  Result Value Ref Range Status   Specimen Description   Final    URINE, RANDOM Performed at [facility], [address], [place], [place] [address]    Special Requests   Final    NONE Performed at [facility], [address] [place], [place] [address]    Culture   Final    NO GROWTH Performed at [facility], [address] [place], [place] [address]    Report Status 05/13/2020 FINAL  Final  Resp Panel by RT-PCR (Flu A&B, Covid) Nasopharyngeal Swab     Status: None   Collection Time: 05/11/20  9:30 PM   Specimen: Nasopharyngeal Swab; Nasopharyngeal(NP) swabs in vial transport medium  Result Value Ref Range Status   SARS Coronavirus 2 by RT PCR NEGATIVE NEGATIVE Final    Comment: (NOTE) SARS-CoV-2 target nucleic acids are NOT DETECTED.  The SARS-CoV-2 RNA is generally detectable in upper respiratory specimens during the acute phase of infection. The lowest concentration of SARS-CoV-2 viral copies this assay can detect is 138 copies/mL. A negative result does not preclude SARS-Cov-2 infection and should not be used as the sole basis for treatment or other patient management decisions. A negative result may occur with  improper specimen collection/handling, submission of specimen other than nasopharyngeal swab, presence of viral mutation(s) within the areas targeted by this assay, and inadequate number of viral copies(<138 copies/mL). A negative result must be combined with clinical observations, patient history, and epidemiological information. The expected result is Negative.  Fact Sheet for Patients:  BloggerCourse.com  Fact Sheet for Healthcare Providers:  SeriousBroker.it  This test is no t yet approved or cleared by the [place] and  has been authorized for detection and/or diagnosis of SARS-CoV-2 by FDA under an Emergency Use Authorization (EUA). This EUA will remain  in effect (meaning this test can be used) for the duration of the COVID-19 declaration under Section 564(b)(1) of the Act, 21 U.S.C.section 360bbb-3(b)(1), unless the authorization is terminated  or revoked sooner.       Influenza A by PCR NEGATIVE  NEGATIVE Final   Influenza B by PCR NEGATIVE NEGATIVE Final    Comment: (NOTE) The Xpert Xpress SARS-CoV-2/FLU/RSV plus assay is intended as an aid in the diagnosis of influenza from Nasopharyngeal swab specimens and should not be used as a sole basis for treatment. Nasal washings and aspirates are unacceptable for Xpert Xpress SARS-CoV-2/FLU/RSV testing.  Fact Sheet for Patients: EntrepreneurPulse.com.au  Fact Sheet for Healthcare Providers: IncredibleEmployment.be  This test is not yet approved or cleared by the [place] FDA and has been authorized for detection and/or diagnosis of SARS-CoV-2 by FDA under an Emergency Use Authorization (EUA). This EUA will remain in effect (meaning this test can be used) for the duration of the COVID-19 declaration under Section 564(b)(1) of the Act, 21 U.S.C. section 360bbb-3(b)(1), unless the authorization is terminated or revoked.  Performed at [facility], [place] [address], [place], [place] [address]   MRSA PCR Screening     Status: None   Collection Time: 05/12/20 12:26 AM   Specimen: Nasopharyngeal Swab  Result Value Ref Range Status   MRSA by PCR NEGATIVE NEGATIVE Final    Comment:        The GeneXpert MRSA Assay (FDA approved for NASAL specimens only), is one component of a comprehensive MRSA colonization surveillance program. It is not intended to diagnose MRSA infection nor to guide or monitor treatment for MRSA infections. Performed at [facility], [place] [address], [place], [place] [address]   Expectorated Sputum Assessment w Gram Stain, Rflx to Resp Cult     Status: None   Collection Time: 05/17/20 11:29 PM   Specimen: Sputum  Result Value Ref Range Status   Specimen Description SPUTUM  Final   Special Requests NONE  Final   Sputum evaluation   Final    THIS SPECIMEN IS ACCEPTABLE FOR SPUTUM CULTURE Performed at [facility], [place] [address]  [place], [place] [address]    Report Status 05/18/2020 FINAL  Final  Culture, Respiratory w Gram Stain     Status: None   Collection Time: 05/17/20 11:29 PM   Specimen: SPU  Result Value Ref Range Status   Specimen Description SPUTUM  Final   Special Requests NONE Reflexed from [id number]  Final   Gram Stain   Final    FEW WBC PRESENT, PREDOMINANTLY PMN FEW GRAM NEGATIVE RODS FEW GRAM POSITIVE COCCI IN PAIRS IN CLUSTERS RARE GRAM POSITIVE RODS    Culture   Final    FEW Normal respiratory flora-no Staph aureus or Pseudomonas seen Performed at [facility], [address] [place], [place] [address]    Report Status 05/20/2020 FINAL  Final  Culture, blood (Routine X 2) w Reflex to ID Panel     Status: None   Collection Time: 05/19/20 12:44 AM   Specimen: BLOOD  Result Value Ref Range Status   Specimen Description BLOOD RIGHT ARM  Final   Special Requests   Final    BOTTLES DRAWN AEROBIC AND ANAEROBIC Blood  Culture adequate volume   Culture   Final    NO GROWTH 5 DAYS Performed at [facility], [place] [address] [place], [place] [address]    Report Status 05/24/2020 FINAL  Final  Culture, blood (Routine X 2) w Reflex to ID Panel     Status: None   Collection Time: 05/19/20 12:59 AM   Specimen: BLOOD RIGHT HAND  Result Value Ref Range Status   Specimen Description BLOOD RIGHT HAND  Final   Special Requests   Final    BOTTLES DRAWN AEROBIC AND ANAEROBIC Blood Culture results may not be optimal due to an inadequate volume of blood received in culture bottles   Culture   Final    NO GROWTH 5 DAYS Performed at [facility], [place] [address], [place], [place] [address]    Report Status 05/24/2020 FINAL  Final  Expectorated Sputum Assessment w Gram Stain, Rflx to Resp Cult     Status: None   Collection Time: 05/19/20  6:30 AM   Specimen: Expectorated Sputum  Result Value Ref Range Status   Specimen Description EXPECTORATED SPUTUM  Final   Special Requests Normal  Final    Sputum evaluation   Final    THIS SPECIMEN IS ACCEPTABLE FOR SPUTUM CULTURE Performed at [facility], [place] [address], [place], [place] [address]    Report Status 05/22/2020 FINAL  Final  Culture, Respiratory w Gram Stain     Status: None   Collection Time: 05/19/20  6:30 AM  Result Value Ref Range Status   Specimen Description EXPECTORATED SPUTUM  Final   Special Requests Normal Reflexed from [id number]  Final   Gram Stain   Final    ABUNDANT WBC PRESENT,BOTH PMN AND MONONUCLEAR FEW SQUAMOUS EPITHELIAL CELLS PRESENT FEW GRAM POSITIVE COCCI RARE GRAM VARIABLE ROD    Culture   Final    RARE Normal respiratory flora-no Staph aureus or Pseudomonas seen Performed at [facility], [place] [address] [place], [place] [address]    Report Status 05/21/2020 FINAL  Final  Culture, blood (routine x 2)     Status: Abnormal (Preliminary result)   Collection Time: 05/31/20  9:28 AM   Specimen: BLOOD  Result Value Ref Range Status   Specimen Description BLOOD LEFT ANTECUBITAL  Final   Special Requests   Final    BOTTLES DRAWN AEROBIC AND ANAEROBIC Blood Culture results may not be optimal due to an inadequate volume of blood received in culture bottles   Culture  Setup Time (A)  Final    YEAST IN BOTH AEROBIC AND ANAEROBIC BOTTLES CRITICAL RESULT CALLED TO, READ BACK BY AND VERIFIED WITH: [place] 4431 [id number] FCP    Culture (A)  Final    CANDIDA GLABRATA Sent to [place] for further susceptibility testing. Performed at [facility], [place] [address] [place], [place] [address]    Report Status PENDING  Incomplete  Blood Culture ID Panel (Reflexed)     Status: Abnormal   Collection Time: 05/31/20  9:28 AM  Result Value Ref Range Status   Enterococcus faecalis NOT DETECTED NOT DETECTED Final   Enterococcus Faecium NOT DETECTED NOT DETECTED Final   Listeria monocytogenes NOT DETECTED NOT DETECTED Final   Staphylococcus species NOT DETECTED NOT DETECTED Final    Staphylococcus aureus (BCID) NOT DETECTED NOT DETECTED Final   Staphylococcus epidermidis NOT DETECTED NOT DETECTED Final   Staphylococcus lugdunensis NOT DETECTED NOT DETECTED Final   Streptococcus species  NOT DETECTED NOT DETECTED Final   Streptococcus agalactiae NOT DETECTED NOT DETECTED Final   Streptococcus pneumoniae NOT DETECTED NOT DETECTED Final   Streptococcus pyogenes NOT DETECTED NOT DETECTED Final   A.calcoaceticus-baumannii NOT DETECTED NOT DETECTED Final   Bacteroides fragilis NOT DETECTED NOT DETECTED Final   Enterobacterales NOT DETECTED NOT DETECTED Final   Enterobacter cloacae complex NOT DETECTED NOT DETECTED Final   Escherichia coli NOT DETECTED NOT DETECTED Final   Klebsiella aerogenes NOT DETECTED NOT DETECTED Final   Klebsiella oxytoca NOT DETECTED NOT DETECTED Final   Klebsiella pneumoniae NOT DETECTED NOT DETECTED Final   Proteus species NOT DETECTED NOT DETECTED Final   Salmonella species NOT DETECTED NOT DETECTED Final   Serratia marcescens NOT DETECTED NOT DETECTED Final   Haemophilus influenzae NOT DETECTED NOT DETECTED Final   Neisseria meningitidis NOT DETECTED NOT DETECTED Final   Pseudomonas aeruginosa NOT DETECTED NOT DETECTED Final   Stenotrophomonas maltophilia NOT DETECTED NOT DETECTED Final   Candida albicans NOT DETECTED NOT DETECTED Final   Candida auris NOT DETECTED NOT DETECTED Final   Candida glabrata DETECTED (A) NOT DETECTED Final    Comment: CRITICAL RESULT CALLED TO, READ BACK BY AND VERIFIED WITH: PHARMD THOMAS J. [id number] FCP    Candida krusei NOT DETECTED NOT DETECTED Final   Candida parapsilosis NOT DETECTED NOT DETECTED Final   Candida tropicalis NOT DETECTED NOT DETECTED Final   Cryptococcus neoformans/gattii NOT DETECTED NOT DETECTED Final    Comment: Performed at [facility], [address], [place], [place] [address]  Antifungal AST 9 Drug Panel     Status: None (Preliminary result)   Collection Time: 05/31/20  9:28  AM  Result Value Ref Range Status   Organism ID, Yeast Preliminary report  Final    Comment: (NOTE) Specimen has been received and testing has been initiated. Performed At: [facility] [place], [place] [id number] Rush Farmer MD YJ:[id number]    Amphotericin B MIC PENDING  Incomplete   Please Note: PENDING  Incomplete   Anidulafungin MIC PENDING  Incomplete   Caspofungin MIC PENDING  Incomplete   Micafungin MIC PENDING  Incomplete   Posaconazole MIC PENDING  Incomplete   Please Note: PENDING  Incomplete   Fluconazole Islt MIC PENDING  Incomplete   Flucytosine MIC PENDING  Incomplete   Itraconazole MIC PENDING  Incomplete   Voriconazole MIC PENDING  Incomplete   Source PENDING  Incomplete  Culture, blood (routine x 2)     Status: Abnormal (Preliminary result)   Collection Time: 05/31/20  9:36 AM   Specimen: BLOOD LEFT HAND  Result Value Ref Range Status   Specimen Description BLOOD LEFT HAND  Final   Special Requests   Final    BOTTLES DRAWN AEROBIC AND ANAEROBIC Blood Culture results may not be optimal due to an inadequate volume of blood received in culture bottles   Culture  Setup Time   Final    YEAST IN BOTH AEROBIC AND ANAEROBIC BOTTLES CRITICAL VALUE NOTED.  VALUE IS CONSISTENT WITH PREVIOUSLY REPORTED AND CALLED VALUE. Performed at [facility], [place] [address], [place], [place] [address]    Culture CANDIDA GLABRATA (A)  Final   Report Status PENDING  Incomplete  MRSA PCR Screening     Status: None   Collection Time: 05/31/20  3:52 PM   Specimen: Nasal Mucosa; Nasopharyngeal  Result Value Ref Range Status   MRSA by PCR NEGATIVE NEGATIVE Final    Comment:  The GeneXpert MRSA Assay (FDA approved for NASAL specimens only), is one component of a comprehensive MRSA colonization surveillance program. It is not intended to diagnose MRSA infection nor to guide or monitor treatment for MRSA infections. Performed at [facility], [place] [address] [place], [place] [address]   Culture, blood (Routine X 2) w Reflex to ID Panel     Status: None (Preliminary result)   Collection Time: 06/02/20 10:08 AM   Specimen: BLOOD  Result Value Ref Range Status   Specimen Description BLOOD LEFT ANTECUBITAL  Final   Special Requests   Final    BOTTLES DRAWN AEROBIC ONLY Blood Culture adequate volume   Culture  Setup Time   Final    YEAST AEROBIC BOTTLE ONLY CRITICAL VALUE NOTED.  VALUE IS CONSISTENT WITH PREVIOUSLY REPORTED AND CALLED VALUE. Performed at [facility], [place] [address] [place], [place] [address]    Culture PENDING  Incomplete   Report Status PENDING  Incomplete  Culture, blood (Routine X 2) w Reflex to ID Panel     Status: None (Preliminary result)   Collection Time: 06/02/20 10:08 AM   Specimen: BLOOD LEFT HAND  Result Value Ref Range Status   Specimen Description BLOOD LEFT HAND  Final   Special Requests   Final    BOTTLES DRAWN AEROBIC ONLY Blood Culture adequate volume   Culture  Setup Time   Final    YEAST WITH PSEUDOHYPHAE AEROBIC BOTTLE ONLY Organism ID to follow CRITICAL RESULT CALLED TO, READ BACK BY AND VERIFIED WITH: J. Roselie Skinner, AT [id number] 06/05/20 [place] Performed at [facility], [place] [address], [place], [place] [address]    Culture YEAST  Final   Report Status PENDING  Incomplete  Culture, blood (routine x 2)     Status: None (Preliminary result)   Collection Time: 06/04/20  3:19 PM   Specimen: BLOOD  Result Value Ref Range Status   Specimen Description BLOOD LEFT ANTECUBITAL  Final   Special Requests   Final    BOTTLES DRAWN AEROBIC AND ANAEROBIC Blood Culture results may not be optimal due to an inadequate volume of blood received in culture bottles   Culture   Final    NO GROWTH < 24 HOURS Performed at [facility], [place] [address] [place], [place] [address]    Report Status PENDING  Incomplete  Culture, blood (routine x 2)     Status: None  (Preliminary result)   Collection Time: 06/04/20  3:20 PM   Specimen: BLOOD RIGHT HAND  Result Value Ref Range Status   Specimen Description BLOOD RIGHT HAND  Final   Special Requests   Final    BOTTLES DRAWN AEROBIC ONLY Blood Culture results may not be optimal due to an inadequate volume of blood received in culture bottles   Culture   Final    NO GROWTH < 24 HOURS Performed at [facility], [place] [address], [place], [place] [address]    Report Status PENDING  Incomplete    Studies/Results: DG CHEST PORT 1 VIEW  Result Date: 06/04/2020 CLINICAL DATA:  Hypoxia EXAM: PORTABLE CHEST 1 VIEW COMPARISON:  May 31, 2020 FINDINGS: Mediastinal contour and cardiac silhouette are stable. Heart size is enlarged. Feeding tube is identified unchanged. Patchy consolidation of left lung base is unchanged. The right lung is clear. Old posttraumatic deformity of several right ribs are unchanged. IMPRESSION: Patchy consolidation of left lung base unchanged. Electronically Signed  By: Abelardo Diesel M.D.   On: 06/04/2020 09:20   ECHOCARDIOGRAM LIMITED BUBBLE STUDY  Result Date: 06/04/2020    ECHOCARDIOGRAM LIMITED REPORT   Patient Name:   Jason [name] Date of Exam: 06/04/2020 Medical Rec #:  [id number]  Height:       70.0 in Accession #:    [id number] Weight:       222.4 lb Date of Birth:  [birth year]  BSA:          2.184 m Patient Age:    70 years   BP:           103/65 mmHg Patient Gender: M          HR:           77 bpm. Exam Location:  Inpatient Procedure: Limited Echo and Saline Contrast Bubble Study Indications:    evaluation to r/o endocarditis  History:        Patient has prior history of Echocardiogram examinations, most                 recent 05/12/2020. CAD and Previous Myocardial Infarction,                 Stroke; Risk Factors:Hypertension and Diabetes. Can do limited                 with focus on valves evaluation to r/o endocarditis.  Sonographer:    Alvino Chapel RCS Referring Phys: [id number] [place]  1. Left ventricular ejection fraction, by estimation, is 60 to 65%. The left ventricle has normal function. The left ventricle has no regional wall motion abnormalities.  2. Right ventricular systolic function is normal. The right ventricular size is normal.  3. The mitral valve is degenerative. No evidence of mitral valve regurgitation. No evidence of mitral stenosis.  4. The aortic valve is calcified. There is moderate calcification of the aortic valve. There is moderate thickening of the aortic valve. Aortic valve regurgitation is not visualized. Mild to moderate aortic valve sclerosis/calcification is present, without any evidence of aortic stenosis.  5. The inferior vena cava is normal in size with greater than 50% respiratory variability, suggesting right atrial pressure of 3 mmHg.  6. Agitated saline contrast bubble study was negative, with no evidence of any interatrial shunt.  7. No obvious vegetation noted but if clinical suspcion high, recommend TEE. FINDINGS  Left Ventricle: Left ventricular ejection fraction, by estimation, is 60 to 65%. The left ventricle has normal function. The left ventricle has no regional wall motion abnormalities. The left ventricular internal cavity size was normal in size. There is  no left ventricular hypertrophy. Right Ventricle: The right ventricular size is normal. No increase in right ventricular wall thickness. Right ventricular systolic function is normal. Left Atrium: Left atrial size was normal in size. Right Atrium: Right atrial size was normal in size. Pericardium: There is no evidence of pericardial effusion. Mitral Valve: The mitral valve is degenerative in appearance. There is mild thickening of the anterior mitral valve leaflet(s). There is mild calcification of the anterior mitral valve leaflet(s). Mild mitral annular calcification. No evidence of mitral valve stenosis. Tricuspid Valve: The tricuspid valve is normal in structure. Tricuspid  valve regurgitation is not demonstrated. No evidence of tricuspid stenosis. Aortic Valve: The aortic valve is calcified. There is moderate calcification of the aortic valve. There is moderate thickening of the aortic valve. Aortic valve regurgitation is not visualized. Mild to moderate aortic valve sclerosis/calcification is  present, without any evidence of aortic stenosis. Pulmonic Valve: The pulmonic valve was normal in structure. Pulmonic valve regurgitation is not visualized. No evidence of pulmonic stenosis. Aorta: The aortic root is normal in size and structure. Venous: The inferior vena cava is normal in size with greater than 50% respiratory variability, suggesting right atrial pressure of 3 mmHg. IAS/Shunts: No atrial level shunt detected by color flow Doppler. Agitated saline contrast was given intravenously to evaluate for intracardiac shunting. Agitated saline contrast bubble study was negative, with no evidence of any interatrial shunt. Fransico Him MD Electronically signed by Fransico Him MD Signature Date/Time: 06/04/2020/2:53:15 PM    Final       Assessment/Plan:  INTERVAL HISTORY:   Patient's cognition worsened again relative to when I saw him on Friday  Active Problems:   Pressure injury of skin   IVH (intraventricular hemorrhage) (HCC)   Altered mental status   Encounter for intubation   Hypertensive emergency   Atrial flutter ([place])   Fungemia   Metabolic encephalopathy   Severe sepsis without septic shock ([place])    Jason [name] is a 70 y.o. male with Struve coronary disease diabetes mellitus paroxysmal atrial fibrillation stroke systolic heart failure who was admitted to the ICU with thalamic hemorrhage with intraventricular extension that required reversal of his anticoagulation intubation and placement of an external ventricular drain.  He was then reintubated but fortunately successfully extubated.  Drain has been out.  He does not appear to have had any central lines.   He became febrile to 102.8 and blood cultures were taken and he was started on vancomycin and Zosyn.  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 I have not seen Candidal meningitis but he did have drain placed (it does not appear overtly infected and MRI brain unremarkable for infection  Hemology will see to exclude fungal endophthalmitis  --eraxis  --Follow-up sensitivities on Candida glabrata  #2 Goals of care: Had an extensive discussion with patient's oldest son Dominica Severin regarding what " goals of care" meant and also with regards to his father's prognosis and trajectory.  He tells me that his younger brother [name] is the decision maker and I believe has healthcare power of attorney.  He was concerned that providers not discuss their fathers prognosis in his presence.  Apparently last week when palliative care were seeing the patient according to Dominica Severin his father told him "she (palliative care NP) is trying to kill me."  I assured him I would pass on this concern to team and that we would try to ensure utmost respect dignity for his father.  His brother apparently is driving from [place] and will be her on Tuesday and Wednesday and he asked if I could talk to his brother when he arrives.  I did also go over the spectrum of care from extreme aggressive care with surgeries and interventions versus pure palliative care.  I spent greater than 35 minutes with the patient including greater than 50% of time in face to face counsel of the patient and his son  and in coordination of their care.     LOS: 25 days   Alcide Evener 06/05/2020, 3:27 PM

## 2020-06-05 NOTE — Progress Notes (Signed)
SLP eval reviewed, patient cleared for D1 diet. Surgery will sign off at this time. Please re-consult for additional questions/concerns.   Diamantina Monks, MD General and Trauma Surgery [facility] Surgery

## 2020-06-05 NOTE — Progress Notes (Signed)
  Speech Language Pathology Treatment: Dysphagia  Patient Details Name: Jason [name] MRN: [id number] DOB: 04/18/1950 Today's Date: 06/05/2020 Time: 9562-1308 SLP Time Calculation (min) (ACUTE ONLY): 28 min  Assessment / Plan / Recommendation Clinical Impression  Pt demonstrates stable arousal and swallow function in comparison with last visit on 3/18 though reportedly he was too lethargic to attempt his new diet over the weekend. Today pt had eyes open, able to follow one step commands though very weak. Attempted to assist pt in in deep breaths for audible phonation and word level intelligibility with 0% accuracy. Pt was able to consume 8 oz of honey thick water by spoon and 4 oz of chocolate pudding without signs of aspiration, though there is high potential for moderate pharyngeal residue. Recommend staff attempt teaspoons of honey thick liquids and pudding if pt is awake and upright. Will f/u for tolerance.   HPI HPI: 70yo male admitted 05/11/20 with AMS. PMH: HTN, DM, CVA, CAD, MI, CABG, AFib, anxiety/depression, recent fall (02/2020) with DKA/HHS, memory impairment.  CTHead - hemorrhage with intraventricular extension and possibly mildly increased ventricular size. Intubated 2/25 - 3/1.Evaluated 3/2, on 3/4 recommended full liquids, pt passed [place], worsening mental status, reintubated 3/5-3/8.      SLP Plan  Continue with current plan of care       Recommendations  Diet recommendations: Dysphagia 1 (puree);Honey-thick liquid Liquids provided via: Teaspoon Medication Administration: Via alternative means Supervision: Staff to assist with self feeding Compensations: Slow rate;Small sips/bites;Follow solids with liquid Postural Changes and/or Swallow Maneuvers: Seated upright 90 degrees                Follow up Recommendations: Skilled Nursing facility SLP Visit Diagnosis: Dysphagia, oral phase (R13.11);Dysphagia, unspecified (R13.10) Plan: Continue with current plan of care        GO                DeBlois, Riley Nearing 06/05/2020, 9:59 AM

## 2020-06-05 NOTE — Final Consult Note (Signed)
OPHTHALMOLOGY CONSULT NOTE  Date: 06/05/20 Time: 3:42 PM  Patient Name: Jason [name]  DOB: 28-Feb-1951 MRN: [id number]  Reason for Consult: R/O fungal endopthalmitis  HPI:  This is a 70 y.o. male admitted for hemorrhagic stroke with complicated clinical course including a diagnosis of fungemia for whom ophthalmology was consulted to rule out fungal endopthalmitis.   Prior to Admission medications   Medication Sig Start Date End Date Taking? Authorizing Provider  acetaminophen (TYLENOL) 650 MG CR tablet Take 650 mg by mouth every 8 (eight) hours as needed for pain.   Yes [provider]  benazepril (LOTENSIN) 40 MG tablet Take 1 tablet (40 mg total) by mouth daily. 03/08/20 05/07/20 Yes Steffanie Rainwater, MD  buPROPion (WELLBUTRIN) 75 MG tablet Take 1 tablet (75 mg total) by mouth daily. 03/09/20 05/08/20 Yes Amponsah, Flossie Buffy, MD  docusate sodium (COLACE) 100 MG capsule Take 100 mg by mouth daily.   Yes [provider]  doxycycline (VIBRAMYCIN) 100 MG capsule Take 100 mg by mouth 2 (two) times daily. Start date: 05/09/20   Yes [provider]  ELIQUIS 5 MG TABS tablet Take 1 tablet (5 mg total) by mouth 2 (two) times daily. 03/08/20 05/07/20 Yes Amponsah, Flossie Buffy, MD  Insulin Glargine [facility] KWIKPEN) 100 UNIT/ML Inject 35 Units into the skin daily. ON Hold per [facility] Patient taking differently: Inject 20 Units into the skin at bedtime. 03/08/20  Yes Amponsah, Flossie Buffy, MD  insulin lispro (HUMALOG) 100 UNIT/ML KwikPen Inject 2-12 Units into the skin 3 (three) times daily. Sliding scale:   0-150 = 2 units 151-200 = 4 units 201-250 = 6 units 251-300 = 8 units 301-350 = 10 units 351-400 = 12 units   Yes [provider]  melatonin 3 MG TABS tablet Take 3 mg by mouth at bedtime.   Yes [provider]  metoprolol tartrate (LOPRESSOR) 50 MG tablet Take 1 tablet (50 mg total) by mouth 2 (two) times daily. 03/08/20 04/07/20 Yes Steffanie Rainwater, MD   ondansetron (ZOFRAN) 4 MG tablet Take 4 mg by mouth every 8 (eight) hours as needed for nausea or vomiting.   Yes [provider]  polyethylene glycol (MIRALAX / GLYCOLAX) 17 g packet Take 17 g by mouth daily.   Yes [provider]  rosuvastatin (CRESTOR) 20 MG tablet Take 1 tablet (20 mg total) by mouth daily. 03/09/20 04/08/20 Yes Steffanie Rainwater, MD  aspirin EC 81 MG tablet Take 1 tablet (81 mg total) by mouth daily. Patient not taking: Reported on 05/11/2020 03/08/20   Steffanie Rainwater, MD  insulin lispro (HUMALOG) 100 UNIT/ML injection Inject 0.02-0.12 mLs (2-12 Units total) into the skin 3 (three) times daily with meals. Based on Sliding scale Patient not taking: Reported on 05/11/2020 03/08/20   Steffanie Rainwater, MD  lidocaine (LIDODERM) 5 % Place 2 patches onto the skin daily. Remove & Discard patch within 12 hours or as directed by MD Patient not taking: Reported on 05/11/2020 03/09/20   Steffanie Rainwater, MD    Past Medical History:  Diagnosis Date  . Anxiety   . Coronary artery disease   . Depression   . Diabetes mellitus without complication (HCC)   . Dysrhythmia   . Hypertension   . Myocardial infarction (HCC)   . Stroke [facility])     family history is not on file.  Social History   Occupational History  . Occupation: Retired  Tobacco Use  . Smoking status: Never Smoker  .  Smokeless tobacco: Never Used  Vaping Use  . Vaping Use: Never used  Substance and Sexual Activity  . Alcohol use: Not Currently  . Drug use: Not Currently  . Sexual activity: Not Currently    No Known Allergies  ROS: Positive as above, otherwise negative.  EXAM:  Mental Status: Minimally responsive  Base Exam: Right Eye Left Eye  Visual Acuity (At near) Unable, would not fix or follow unable  IOP (Tonopen) 14 13  Pupillary Exam dilated   Motility Moves in all directions with dolls head   Confrontation VF unable unable   Anterior Segment Exam     Lids/Lashes WNL WN  Conjuctiva White and Quiet White and Quiet  Cornea Clear Clear  Anterior Chamber Deep and Quiet Deep and Quiet  Iris Round Round  Lens nsc with cortical changes nsc with cortical changes  Vitreous WNL WNL   Poster Segment Exam    Disc Sharp Sharp  CD ratio    Macula Flat, small temporal heme Flat  Vessels WNL WNL  Periphery Attached Attached   Radiographic Studies Reviewed:  DG CHEST PORT 1 VIEW  Result Date: 06/04/2020 CLINICAL DATA:  Hypoxia EXAM: PORTABLE CHEST 1 VIEW COMPARISON:  May 31, 2020 FINDINGS: Mediastinal contour and cardiac silhouette are stable. Heart size is enlarged. Feeding tube is identified unchanged. Patchy consolidation of left lung base is unchanged. The right lung is clear. Old posttraumatic deformity of several right ribs are unchanged. IMPRESSION: Patchy consolidation of left lung base unchanged. Electronically Signed   By: Sherian Rein M.D.   On: 06/04/2020 09:20   ECHOCARDIOGRAM LIMITED BUBBLE STUDY  Result Date: 06/04/2020    ECHOCARDIOGRAM LIMITED REPORT   Patient Name:   Jason [name] Date of Exam: 06/04/2020 Medical Rec #:  [id number]  Height:       70.0 in Accession #:    [id number] Weight:       222.4 lb Date of Birth:  [birth year]  BSA:          2.184 m Patient Age:    70 years   BP:           103/65 mmHg Patient Gender: M          HR:           77 bpm. Exam Location:  Inpatient Procedure: Limited Echo and Saline Contrast Bubble Study Indications:    evaluation to r/o endocarditis  History:        Patient has prior history of Echocardiogram examinations, most                 recent 05/12/2020. CAD and Previous Myocardial Infarction,                 Stroke; Risk Factors:Hypertension and Diabetes. Can do limited                 with focus on valves evaluation to r/o endocarditis.  Sonographer:    Celesta Gentile RCS Referring Phys: [id number] TRUNG T VU IMPRESSIONS  1. Left ventricular ejection fraction, by estimation, is 60 to 65%. The left  ventricle has normal function. The left ventricle has no regional wall motion abnormalities.  2. Right ventricular systolic function is normal. The right ventricular size is normal.  3. The mitral valve is degenerative. No evidence of mitral valve regurgitation. No evidence of mitral stenosis.  4. The aortic valve is calcified. There is moderate calcification of the aortic valve. There is moderate thickening of the aortic  valve. Aortic valve regurgitation is not visualized. Mild to moderate aortic valve sclerosis/calcification is present, without any evidence of aortic stenosis.  5. The inferior vena cava is normal in size with greater than 50% respiratory variability, suggesting right atrial pressure of 3 mmHg.  6. Agitated saline contrast bubble study was negative, with no evidence of any interatrial shunt.  7. No obvious vegetation noted but if clinical suspcion high, recommend TEE. FINDINGS  Left Ventricle: Left ventricular ejection fraction, by estimation, is 60 to 65%. The left ventricle has normal function. The left ventricle has no regional wall motion abnormalities. The left ventricular internal cavity size was normal in size. There is  no left ventricular hypertrophy. Right Ventricle: The right ventricular size is normal. No increase in right ventricular wall thickness. Right ventricular systolic function is normal. Left Atrium: Left atrial size was normal in size. Right Atrium: Right atrial size was normal in size. Pericardium: There is no evidence of pericardial effusion. Mitral Valve: The mitral valve is degenerative in appearance. There is mild thickening of the anterior mitral valve leaflet(s). There is mild calcification of the anterior mitral valve leaflet(s). Mild mitral annular calcification. No evidence of mitral valve stenosis. Tricuspid Valve: The tricuspid valve is normal in structure. Tricuspid valve regurgitation is not demonstrated. No evidence of tricuspid stenosis. Aortic Valve: The  aortic valve is calcified. There is moderate calcification of the aortic valve. There is moderate thickening of the aortic valve. Aortic valve regurgitation is not visualized. Mild to moderate aortic valve sclerosis/calcification is present, without any evidence of aortic stenosis. Pulmonic Valve: The pulmonic valve was normal in structure. Pulmonic valve regurgitation is not visualized. No evidence of pulmonic stenosis. Aorta: The aortic root is normal in size and structure. Venous: The inferior vena cava is normal in size with greater than 50% respiratory variability, suggesting right atrial pressure of 3 mmHg. IAS/Shunts: No atrial level shunt detected by color flow Doppler. Agitated saline contrast was given intravenously to evaluate for intracardiac shunting. Agitated saline contrast bubble study was negative, with no evidence of any interatrial shunt. Armanda Magic MD Electronically signed by Armanda Magic MD Signature Date/Time: 06/04/2020/2:53:15 PM    Final     Assessment and Recommendation: 1. Fungemia: with no signs of fungal endophthalmitis 2. Intraretinal hemorrhage right eye: Small likely diabetic mediated and represents non proliferative diabetic retinopathy though exam limited to bedside exam. Rec primary team to arrange follow up with prior ophthalmologist/optometrist, or [place] at discharge for follow up within a few months.   Please call with any questions.  Sinda Du MD [facility] Ophthalmology [phone]

## 2020-06-05 NOTE — Progress Notes (Signed)
Physical Therapy Treatment Patient Details Name: Jason CookMRN: [id number] DOB: 1950-11-30 Today's Date: 06/05/2020    History of Present Illness Pt is a 70 y.o. male admitted from [place] [facility] on 05/11/20 with AMS. 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 S/p R frontal IVC placement. ETT 2/25-3/1; EVD accidentally disloged 3/5 and pt reintubated; extubated again 3/8. Concern for new rightward gaze 3/6 with repeat head CT showing no new changes; thalamic ICH resolving. EEG 3/8 showed no seizures. Pt with dysphagia; plan for PEG placement 3/15. PMH includes DM, HTN, CVA, CABG, afib with RVR, anxiety, depression; of note, recent admission from home 02/2020 with DKA, d/c to SNF for rehab.    PT Comments    Today's skilled session focused on LE stretching and strengthening. No assistance noted from patient. Pt with facial grimacing with all movements, increased with hip movements. RN made aware. Acute PT to continued during pt's hospital stay.    Follow Up Recommendations  SNF;Supervision for mobility/OOB     Equipment Recommendations  Hospital bed;Wheelchair (measurements PT);Wheelchair cushion (measurements PT);Other (comment) (hoyer lift)    Precautions / Restrictions Precautions Precautions: Fall;Other (comment) Precaution Comments: Bladder/bowel incontinence; sacral wound w/ dressing; Cortrak Restrictions Weight Bearing Restrictions: No    Cognition Arousal/Alertness: Awake/alert Behavior During Therapy: Flat affect   Area of Impairment: Attention;Following commands;Awareness;Problem solving                   Current Attention Level: Focused Memory: Decreased recall of precautions;Decreased short-term memory Following Commands: Follows one step commands inconsistently Safety/Judgement: Decreased awareness of safety;Decreased awareness of deficits Awareness: Intellectual Problem Solving: Decreased initiation;Slow processing;Difficulty  sequencing;Requires verbal cues;Requires tactile cues General Comments: Patient with minimal verbalizations this session. Patient nodding head yes/no to questions with delay to <25% of questions, pt did track toward left x3 when son spoke from that direction      Exercises General Exercises - Lower Extremity Ankle Circles/Pumps: PROM;Both;10 reps;Supine Heel Slides: PROM;Both;10 reps;Supine Hip ABduction/ADduction: PROM;Both;10 reps;Supine Straight Leg Raises: PROM;Both;10 reps;Supine Other Exercises Other Exercises: passived heel cord stretching 30 sec's x 3 reps each side.     Pertinent Vitals/Pain Pain Assessment: Faces Faces Pain Scale: Hurts even more Pain Location: with all movements this session Pain Descriptors / Indicators: Grimacing;Moaning;Guarding Pain Intervention(s): Limited activity within patient's tolerance;Monitored during session;Repositioned;Patient requesting pain meds-RN notified     PT Goals (current goals can now be found in the care plan section) Acute Rehab PT Goals Patient Stated Goal: none stated; per chart, pt's son wants to continue with full scope of care PT Goal Formulation: Patient unable to participate in goal setting Time For Goal Achievement: 06/12/20 Potential to Achieve Goals: Poor Progress towards PT goals: Progressing toward goals    Frequency    Min 2X/week      PT Plan Current plan remains appropriate    AM-PAC PT "6 Clicks" Mobility   Outcome Measure  Help needed turning from your back to your side while in a flat bed without using bedrails?: A Lot Help needed moving from lying on your back to sitting on the side of a flat bed without using bedrails?: Total Help needed moving to and from a bed to a chair (including a wheelchair)?: Total Help needed standing up from a chair using your arms (e.g., wheelchair or bedside chair)?: Total Help needed to walk in hospital room?: Total Help needed climbing 3-5 steps with a railing? :  Total 6 Click Score: 7    End of  Session Equipment Utilized During Treatment: Oxygen Activity Tolerance: Patient limited by lethargy;Patient limited by pain Patient left: in bed;with call bell/phone within reach;with SCD's reapplied;with family/visitor present (son at bedside; reapplied bil PRAFO boots when session ended as well.) Nurse Communication: Mobility status;Need for lift equipment PT Visit Diagnosis: Muscle weakness (generalized) (M62.81)     Time: 0350-0938 PT Time Calculation (min) (ACUTE ONLY): 17 min  Charges:  $Therapeutic Exercise: 8-22 mins                    Sallyanne Kuster, PTA, [facility] Acute Rehab Services Office- [phone] 06/05/20, 1:48 PM   Sallyanne Kuster 06/05/2020, 1:47 PM

## 2020-06-05 NOTE — Progress Notes (Signed)
PHARMACY - PHYSICIAN COMMUNICATION CRITICAL VALUE ALERT - BLOOD CULTURE IDENTIFICATION (BCID)  Jason [name] is an 70 y.o. male who presented to [facility] on 05/11/2020 .  Has positive blood cultures for candida glabrata on 3/16.  A repeat blood cultures on 3/18 are now positive for yeast  Assessment: On anidulafungin already for yeast bloodstream infection  Name of physician (or Provider) Contacted: ID team via [name], PharmD  Current antibiotics: Anidulafuntin  Changes to prescribed antibiotics recommended: None Continue anidulafungin  Results for orders placed or performed during the hospital encounter of 05/11/20  Blood Culture ID Panel (Reflexed) (Collected: 05/31/2020  9:28 AM)  Result Value Ref Range   Enterococcus faecalis NOT DETECTED NOT DETECTED   Enterococcus Faecium NOT DETECTED NOT DETECTED   Listeria monocytogenes NOT DETECTED NOT DETECTED   Staphylococcus species NOT DETECTED NOT DETECTED   Staphylococcus aureus (BCID) NOT DETECTED NOT DETECTED   Staphylococcus epidermidis NOT DETECTED NOT DETECTED   Staphylococcus lugdunensis NOT DETECTED NOT DETECTED   Streptococcus species NOT DETECTED NOT DETECTED   Streptococcus agalactiae NOT DETECTED NOT DETECTED   Streptococcus pneumoniae NOT DETECTED NOT DETECTED   Streptococcus pyogenes NOT DETECTED NOT DETECTED   A.calcoaceticus-baumannii NOT DETECTED NOT DETECTED   Bacteroides fragilis NOT DETECTED NOT DETECTED   Enterobacterales NOT DETECTED NOT DETECTED   Enterobacter cloacae complex NOT DETECTED NOT DETECTED   Escherichia coli NOT DETECTED NOT DETECTED   Klebsiella aerogenes NOT DETECTED NOT DETECTED   Klebsiella oxytoca NOT DETECTED NOT DETECTED   Klebsiella pneumoniae NOT DETECTED NOT DETECTED   Proteus species NOT DETECTED NOT DETECTED   Salmonella species NOT DETECTED NOT DETECTED   Serratia marcescens NOT DETECTED NOT DETECTED   Haemophilus influenzae NOT DETECTED NOT DETECTED   Neisseria meningitidis  NOT DETECTED NOT DETECTED   Pseudomonas aeruginosa NOT DETECTED NOT DETECTED   Stenotrophomonas maltophilia NOT DETECTED NOT DETECTED   Candida albicans NOT DETECTED NOT DETECTED   Candida auris NOT DETECTED NOT DETECTED   Candida glabrata DETECTED (A) NOT DETECTED   Candida krusei NOT DETECTED NOT DETECTED   Candida parapsilosis NOT DETECTED NOT DETECTED   Candida tropicalis NOT DETECTED NOT DETECTED   Cryptococcus neoformans/gattii NOT DETECTED NOT DETECTED    Jason [name] 06/05/2020  9:30 AM

## 2020-06-06 ENCOUNTER — Inpatient Hospital Stay (HOSPITAL_COMMUNITY): Payer: Medicare HMO

## 2020-06-06 DIAGNOSIS — R601 Generalized edema: Secondary | ICD-10-CM

## 2020-06-06 DIAGNOSIS — R4182 Altered mental status, unspecified: Secondary | ICD-10-CM | POA: Diagnosis not present

## 2020-06-06 DIAGNOSIS — B3789 Other sites of candidiasis: Secondary | ICD-10-CM | POA: Diagnosis not present

## 2020-06-06 DIAGNOSIS — I5023 Acute on chronic systolic (congestive) heart failure: Secondary | ICD-10-CM

## 2020-06-06 DIAGNOSIS — N5089 Other specified disorders of the male genital organs: Secondary | ICD-10-CM

## 2020-06-06 DIAGNOSIS — Z1632 Resistance to antifungal drug(s): Secondary | ICD-10-CM | POA: Diagnosis not present

## 2020-06-06 LAB — BLOOD GAS, ARTERIAL
Acid-base deficit: 2 mmol/L (ref 0.0–2.0)
Bicarbonate: 21.7 mmol/L (ref 20.0–28.0)
Drawn by: 51133
FIO2: 32
O2 Saturation: 97 %
Patient temperature: 37.9
pCO2 arterial: 34.6 mmHg (ref 32.0–48.0)
pH, Arterial: 7.418 (ref 7.350–7.450)
pO2, Arterial: 97.8 mmHg (ref 83.0–108.0)

## 2020-06-06 LAB — BASIC METABOLIC PANEL
Anion gap: 9 (ref 5–15)
BUN: 53 mg/dL — ABNORMAL HIGH (ref 8–23)
CO2: 21 mmol/L — ABNORMAL LOW (ref 22–32)
Calcium: 8 mg/dL — ABNORMAL LOW (ref 8.9–10.3)
Chloride: 119 mmol/L — ABNORMAL HIGH (ref 98–111)
Creatinine, Ser: 1.79 mg/dL — ABNORMAL HIGH (ref 0.61–1.24)
GFR, Estimated: 40 mL/min — ABNORMAL LOW (ref >60)
Glucose, Bld: 199 mg/dL — ABNORMAL HIGH (ref 70–99)
Potassium: 3 mmol/L — ABNORMAL LOW (ref 3.5–5.1)
Sodium: 149 mmol/L — ABNORMAL HIGH (ref 135–145)

## 2020-06-06 LAB — CBC
HCT: 39.5 % (ref 39.0–52.0)
Hemoglobin: 12.8 g/dL — ABNORMAL LOW (ref 13.0–17.0)
MCH: 27.8 pg (ref 26.0–34.0)
MCHC: 32.4 g/dL (ref 30.0–36.0)
MCV: 85.9 fL (ref 80.0–100.0)
Platelets: 65 10*3/uL — ABNORMAL LOW (ref 150–400)
RBC: 4.6 MIL/uL (ref 4.22–5.81)
RDW: 15.9 % — ABNORMAL HIGH (ref 11.5–15.5)
WBC: 9.1 10*3/uL (ref 4.0–10.5)
nRBC: 0.2 % (ref 0.0–0.2)

## 2020-06-06 LAB — GLUCOSE, CAPILLARY
Glucose-Capillary: 142 mg/dL — ABNORMAL HIGH (ref 70–99)
Glucose-Capillary: 184 mg/dL — ABNORMAL HIGH (ref 70–99)
Glucose-Capillary: 204 mg/dL — ABNORMAL HIGH (ref 70–99)
Glucose-Capillary: 219 mg/dL — ABNORMAL HIGH (ref 70–99)
Glucose-Capillary: 220 mg/dL — ABNORMAL HIGH (ref 70–99)
Glucose-Capillary: 225 mg/dL — ABNORMAL HIGH (ref 70–99)
Glucose-Capillary: 242 mg/dL — ABNORMAL HIGH (ref 70–99)

## 2020-06-06 LAB — HEPARIN INDUCED PLATELET AB (HIT ANTIBODY): Heparin Induced Plt Ab: 0.144 OD (ref 0.000–0.400)

## 2020-06-06 MED ORDER — FREE WATER
300.0000 mL | Status: DC
  Administered 2020-06-06 – 2020-06-07 (×8): 300 mL

## 2020-06-06 MED ORDER — MORPHINE SULFATE (PF) 2 MG/ML IV SOLN
2.0000 mg | INTRAVENOUS | Status: DC | PRN
  Administered 2020-06-06 – 2020-06-07 (×2): 2 mg via INTRAVENOUS
  Filled 2020-06-06 (×2): qty 1

## 2020-06-06 MED ORDER — POTASSIUM CHLORIDE 20 MEQ PO PACK
40.0000 meq | PACK | Freq: Once | ORAL | Status: AC
  Administered 2020-06-06: 40 meq
  Filled 2020-06-06: qty 2

## 2020-06-06 MED ORDER — DILTIAZEM HCL 30 MG PO TABS
30.0000 mg | ORAL_TABLET | Freq: Three times a day (TID) | ORAL | Status: DC
  Administered 2020-06-06 – 2020-06-07 (×4): 30 mg via ORAL
  Filled 2020-06-06 (×4): qty 1

## 2020-06-06 MED ORDER — FLUCONAZOLE 200 MG PO TABS
800.0000 mg | ORAL_TABLET | Freq: Every day | ORAL | Status: DC
  Administered 2020-06-06 – 2020-06-08 (×3): 800 mg via ORAL
  Filled 2020-06-06 (×3): qty 4

## 2020-06-06 MED ORDER — FUROSEMIDE 10 MG/ML IJ SOLN
40.0000 mg | Freq: Two times a day (BID) | INTRAMUSCULAR | Status: DC
  Administered 2020-06-06 – 2020-06-07 (×2): 40 mg via INTRAVENOUS
  Filled 2020-06-06 (×2): qty 4

## 2020-06-06 MED ORDER — MORPHINE SULFATE (PF) 2 MG/ML IV SOLN
2.0000 mg | INTRAVENOUS | Status: DC | PRN
  Administered 2020-06-07: 2 mg via INTRAVENOUS
  Filled 2020-06-06: qty 1

## 2020-06-06 NOTE — Progress Notes (Signed)
Subjective: Patient more confused again today when I saw Jason [name]   Antibiotics:  Anti-infectives (From admission, onward)   Start     Dose/Rate Route Frequency Ordered Stop   06/06/20 1200  fluconazole (DIFLUCAN) tablet 800 mg        800 mg Oral Daily 06/06/20 1110     06/03/20 1300  anidulafungin (ERAXIS) 100 mg in sodium chloride 0.9 % 100 mL IVPB  Status:  Discontinued       "Followed by" Linked Group Details   100 mg 78 mL/hr over 100 Minutes Intravenous Every 24 hours 06/02/20 1119 06/06/20 1110   06/02/20 1600  fluconazole (DIFLUCAN) IVPB 400 mg  Status:  Discontinued        400 mg 100 mL/hr over 120 Minutes Intravenous Every 24 hours 06/02/20 1119 06/02/20 1712   06/02/20 1300  anidulafungin (ERAXIS) 200 mg in sodium chloride 0.9 % 200 mL IVPB       "Followed by" Linked Group Details   200 mg 78 mL/hr over 200 Minutes Intravenous  Once 06/02/20 1119 06/02/20 2142   06/02/20 1200  anidulafungin (ERAXIS) 100 mg in sodium chloride 0.9 % 100 mL IVPB  Status:  Discontinued        100 mg 78 mL/hr over 100 Minutes Intravenous Every 24 hours 06/01/20 1230 06/01/20 1249   06/02/20 1000  voriconazole (VFEND) tablet 200 mg  Status:  Discontinued        200 mg Oral Every 12 hours 06/01/20 1347 06/01/20 1436   06/02/20 1000  voriconazole (VFEND) tablet 200 mg  Status:  Discontinued        200 mg Per Tube Every 12 hours 06/01/20 1436 06/02/20 1119   06/01/20 1445  voriconazole (VFEND) tablet 400 mg        400 mg Per Tube Every 12 hours 06/01/20 1436 06/01/20 2145   06/01/20 1430  voriconazole (VFEND) tablet 400 mg  Status:  Discontinued        400 mg Oral Every 12 hours 06/01/20 1347 06/01/20 1436   06/01/20 1330  anidulafungin (ERAXIS) 200 mg in sodium chloride 0.9 % 200 mL IVPB  Status:  Discontinued        200 mg 78 mL/hr over 200 Minutes Intravenous  Once 06/01/20 1230 06/01/20 1249   06/01/20 1300  vancomycin (VANCOREADY) IVPB 1000 mg/200 mL  Status:  Discontinued         1,000 mg 200 mL/hr over 60 Minutes Intravenous Every 24 hours 05/31/20 1323 06/01/20 0928   05/31/20 1900  piperacillin-tazobactam (ZOSYN) IVPB 3.375 g  Status:  Discontinued        3.375 g 12.5 mL/hr over 240 Minutes Intravenous Every 8 hours 05/31/20 1012 06/02/20 1346   05/31/20 1100  vancomycin (VANCOREADY) IVPB 2000 mg/400 mL        2,000 mg 200 mL/hr over 120 Minutes Intravenous  Once 05/31/20 1012 05/31/20 1634   05/31/20 1100  piperacillin-tazobactam (ZOSYN) IVPB 3.375 g        3.375 g 100 mL/hr over 30 Minutes Intravenous  Once 05/31/20 1012 05/31/20 1406   05/21/20 1100  vancomycin (VANCOREADY) IVPB 1750 mg/350 mL  Status:  Discontinued        1,750 mg 175 mL/hr over 120 Minutes Intravenous Every 24 hours 05/20/20 0953 05/23/20 1046   05/20/20 1045  vancomycin (VANCOREADY) IVPB 2000 mg/400 mL        2,000 mg 200 mL/hr over 120 Minutes Intravenous  Once 05/20/20  2229 05/20/20 1341   05/19/20 1015  cefTRIAXone (ROCEPHIN) 2 g in sodium chloride 0.9 % 100 mL IVPB  Status:  Discontinued        2 g 200 mL/hr over 30 Minutes Intravenous Every 24 hours 05/18/20 0958 05/18/20 1132   05/18/20 1400  piperacillin-tazobactam (ZOSYN) IVPB 3.375 g  Status:  Discontinued        3.375 g 12.5 mL/hr over 240 Minutes Intravenous Every 8 hours 05/18/20 1132 05/23/20 1046   05/17/20 1015  cefTRIAXone (ROCEPHIN) 1 g in sodium chloride 0.9 % 100 mL IVPB  Status:  Discontinued        1 g 200 mL/hr over 30 Minutes Intravenous Every 24 hours 05/17/20 0918 05/18/20 0958   05/12/20 1530  ceFAZolin (ANCEF) IVPB 1 g/50 mL premix  Status:  Discontinued        1 g 100 mL/hr over 30 Minutes Intravenous Every 8 hours 05/12/20 1440 05/17/20 0918      Medications: Scheduled Meds: . chlorhexidine gluconate (MEDLINE KIT)  15 mL Mouth Rinse BID  . diltiazem  30 mg Oral Q8H  . feeding supplement (PROSource TF)  45 mL Per Tube TID  . fluconazole  800 mg Oral Daily  . free water  300 mL Per Tube Q4H  . insulin  aspart  0-20 Units Subcutaneous Q4H  . insulin glargine  15 Units Subcutaneous Daily  . levETIRAcetam  750 mg Per Tube BID  . mouth rinse  15 mL Mouth Rinse q12n4p  . metoprolol tartrate  50 mg Per Tube BID  . pantoprazole sodium  40 mg Per Tube Daily  . thiamine  100 mg Per Tube Daily   Continuous Infusions: . sodium chloride    . feeding supplement (JEVITY 1.5 CAL/FIBER) 1,000 mL (06/06/20 0916)   PRN Meds:.acetaminophen **OR** acetaminophen (TYLENOL) oral liquid 160 mg/5 mL **OR** acetaminophen, Gerhardt's butt cream, morphine injection, ondansetron (ZOFRAN) IV, polyethylene glycol, Resource ThickenUp Clear, senna-docusate    Objective: Weight change:   Intake/Output Summary (Last 24 hours) at 06/06/2020 1213 Last data filed at 06/06/2020 0900 Gross per 24 hour  Intake 600 ml  Output 1000 ml  Net -400 ml   Blood pressure 127/76, pulse 100, temperature 98.3 F (36.8 C), temperature source Axillary, resp. rate (!) 40, height $RemoveBe'5\' 10"'zBaPslmko$  (1.778 m), weight 100.9 kg, SpO2 98 %. Temp:  [98.3 F (36.8 C)-100.3 F (37.9 C)] 98.3 F (36.8 C) (03/22 1147) Pulse Rate:  [96-136] 100 (03/22 1147) Resp:  [29-50] 40 (03/22 1147) BP: (122-138)/(76-95) 127/76 (03/22 1147) SpO2:  [94 %-98 %] 98 % (03/22 0352)  Physical Exam: Physical Exam HENT:     Head: Normocephalic.  Eyes:     Extraocular Movements: Extraocular movements intact.  Cardiovascular:     Rate and Rhythm: Tachycardia present.     Heart sounds: No murmur heard. No friction rub. No gallop.   Pulmonary:     Effort: No respiratory distress.     Breath sounds: No stridor. No wheezing.  Abdominal:     General: Bowel sounds are normal. There is no distension.  Skin:    General: Skin is warm and dry.    Date he was following commands including gripping my finger and closing and opening his eyes.  Very sleepy  CBC:    BMET Recent Labs    06/05/20 0433 06/06/20 0435  NA 146* 149*  K 3.5 3.0*  CL 116* 119*  CO2 23  21*  GLUCOSE 254* 199*  BUN 53*  53*  CREATININE 1.79* 1.79*  CALCIUM 8.0* 8.0*     Liver Panel  Recent Labs    06/05/20 0433  PROT 5.8*  ALBUMIN 1.7*  AST 69*  ALT 127*  ALKPHOS 385*  BILITOT 0.7       Sedimentation Rate No results for input(s): ESRSEDRATE in the last 72 hours. C-Reactive Protein No results for input(s): CRP in the last 72 hours.  Micro Results: Recent Results (from the past 720 hour(s))  Blood Cultures (routine x 2)     Status: None   Collection Time: 05/11/20  7:36 PM   Specimen: Site Not Specified; Blood  Result Value Ref Range Status   Specimen Description   Final    SITE NOT SPECIFIED Performed at [facility] [address] [place], [place] [address]    Special Requests   Final    BOTTLES DRAWN AEROBIC AND ANAEROBIC Blood Culture results may not be optimal due to an inadequate volume of blood received in culture bottles Performed at [facility] [address] [place], [place] [address]    Culture   Final    NO GROWTH 5 DAYS Performed at [facility], [place] [address] [place], [place] [address]    Report Status 05/16/2020 FINAL  Final  Blood Cultures (routine x 2)     Status: None   Collection Time: 05/11/20  7:36 PM   Specimen: BLOOD  Result Value Ref Range Status   Specimen Description   Final    BLOOD LEFT WRIST Performed at [facility] [address], [place], [place] [address]    Special Requests   Final    BOTTLES DRAWN AEROBIC AND ANAEROBIC Blood Culture adequate volume Performed at [facility] [address], [place], [place] [address]    Culture   Final    NO GROWTH 5 DAYS Performed at [facility], [place] [address], [place], [place] [address]    Report Status 05/16/2020 FINAL  Final  Urine culture     Status: None   Collection Time: 05/11/20  9:00 PM   Specimen: Urine, Random  Result Value Ref Range Status   Specimen  Description   Final    URINE, RANDOM Performed at [facility] [address], [place], [place] [address]    Special Requests   Final    NONE Performed at [facility], [place] [address] [place], [place] [address]    Culture   Final    NO GROWTH Performed at [facility], [place] [address] [place], [place] [address]    Report Status 05/13/2020 FINAL  Final  Resp Panel by RT-PCR (Flu A&B, Covid) Nasopharyngeal Swab     Status: None   Collection Time: 05/11/20  9:30 PM   Specimen: Nasopharyngeal Swab; Nasopharyngeal(NP) swabs in vial transport medium  Result Value Ref Range Status   SARS Coronavirus 2 by RT PCR NEGATIVE NEGATIVE Final    Comment: (NOTE) SARS-CoV-2 target nucleic acids are NOT DETECTED.  The SARS-CoV-2 RNA is generally detectable in upper respiratory specimens during the acute phase of infection. The lowest concentration of SARS-CoV-2 viral copies this assay can detect is 138 copies/mL. A negative result does not preclude SARS-Cov-2 infection and should not be used as the sole basis for treatment or other patient management decisions. A negative result may occur with  improper specimen collection/handling, submission of specimen other than nasopharyngeal swab, presence of viral  mutation(s) within the areas targeted by this assay, and inadequate number of viral copies(<138 copies/mL). A negative result must be combined with clinical observations, patient history, and epidemiological information. The expected result is Negative.  Fact Sheet for Patients:  EntrepreneurPulse.com.au  Fact Sheet for Healthcare Providers:  IncredibleEmployment.be  This test is no t yet approved or cleared by the [place] FDA and  has been authorized for detection and/or diagnosis of SARS-CoV-2 by FDA under an Emergency Use Authorization (EUA). This EUA will remain  in effect (meaning this test can be  used) for the duration of the COVID-19 declaration under Section 564(b)(1) of the Act, 21 U.S.C.section 360bbb-3(b)(1), unless the authorization is terminated  or revoked sooner.       Influenza A by PCR NEGATIVE NEGATIVE Final   Influenza B by PCR NEGATIVE NEGATIVE Final    Comment: (NOTE) The Xpert Xpress SARS-CoV-2/FLU/RSV plus assay is intended as an aid in the diagnosis of influenza from Nasopharyngeal swab specimens and should not be used as a sole basis for treatment. Nasal washings and aspirates are unacceptable for Xpert Xpress SARS-CoV-2/FLU/RSV testing.  Fact Sheet for Patients: EntrepreneurPulse.com.au  Fact Sheet for Healthcare Providers: IncredibleEmployment.be  This test is not yet approved or cleared by the [place] FDA and has been authorized for detection and/or diagnosis of SARS-CoV-2 by FDA under an Emergency Use Authorization (EUA). This EUA will remain in effect (meaning this test can be used) for the duration of the COVID-19 declaration under Section 564(b)(1) of the Act, 21 U.S.C. section 360bbb-3(b)(1), unless the authorization is terminated or revoked.  Performed at [facility], [place] [address], [place], [place] [address]   MRSA PCR Screening     Status: None   Collection Time: 05/12/20 12:26 AM   Specimen: Nasopharyngeal Swab  Result Value Ref Range Status   MRSA by PCR NEGATIVE NEGATIVE Final    Comment:        The GeneXpert MRSA Assay (FDA approved for NASAL specimens only), is one component of a comprehensive MRSA colonization surveillance program. It is not intended to diagnose MRSA infection nor to guide or monitor treatment for MRSA infections. Performed at [facility], [place] [address], [place], [place] [address]   Expectorated Sputum Assessment w Gram Stain, Rflx to Resp Cult     Status: None   Collection Time: 05/17/20 11:29 PM   Specimen: Sputum  Result Value Ref  Range Status   Specimen Description SPUTUM  Final   Special Requests NONE  Final   Sputum evaluation   Final    THIS SPECIMEN IS ACCEPTABLE FOR SPUTUM CULTURE Performed at [facility], [place] [address], [place], [place] [address]    Report Status 05/18/2020 FINAL  Final  Culture, Respiratory w Gram Stain     Status: None   Collection Time: 05/17/20 11:29 PM   Specimen: SPU  Result Value Ref Range Status   Specimen Description SPUTUM  Final   Special Requests NONE Reflexed from [id number]  Final   Gram Stain   Final    FEW WBC PRESENT, PREDOMINANTLY PMN FEW GRAM NEGATIVE RODS FEW GRAM POSITIVE COCCI IN PAIRS IN CLUSTERS RARE GRAM POSITIVE RODS    Culture   Final    FEW Normal respiratory flora-no Staph aureus or Pseudomonas seen Performed at [facility], [address], [place], [place] [address]    Report Status 05/20/2020 FINAL  Final  Culture, blood (Routine X 2) w Reflex to  ID Panel     Status: None   Collection Time: 05/19/20 12:44 AM   Specimen: BLOOD  Result Value Ref Range Status   Specimen Description BLOOD RIGHT ARM  Final   Special Requests   Final    BOTTLES DRAWN AEROBIC AND ANAEROBIC Blood Culture adequate volume   Culture   Final    NO GROWTH 5 DAYS Performed at [facility], [address], [place], [place] [address]    Report Status 05/24/2020 FINAL  Final  Culture, blood (Routine X 2) w Reflex to ID Panel     Status: None   Collection Time: 05/19/20 12:59 AM   Specimen: BLOOD RIGHT HAND  Result Value Ref Range Status   Specimen Description BLOOD RIGHT HAND  Final   Special Requests   Final    BOTTLES DRAWN AEROBIC AND ANAEROBIC Blood Culture results may not be optimal due to an inadequate volume of blood received in culture bottles   Culture   Final    NO GROWTH 5 DAYS Performed at [facility], [place] [address] [place], [place] [address]    Report Status 05/24/2020 FINAL  Final  Expectorated Sputum Assessment w Gram Stain, Rflx to  Resp Cult     Status: None   Collection Time: 05/19/20  6:30 AM   Specimen: Expectorated Sputum  Result Value Ref Range Status   Specimen Description EXPECTORATED SPUTUM  Final   Special Requests Normal  Final   Sputum evaluation   Final    THIS SPECIMEN IS ACCEPTABLE FOR SPUTUM CULTURE Performed at [facility], [place] [address], [place], [place] [address]    Report Status 05/22/2020 FINAL  Final  Culture, Respiratory w Gram Stain     Status: None   Collection Time: 05/19/20  6:30 AM  Result Value Ref Range Status   Specimen Description EXPECTORATED SPUTUM  Final   Special Requests Normal Reflexed from [id number]  Final   Gram Stain   Final    ABUNDANT WBC PRESENT,BOTH PMN AND MONONUCLEAR FEW SQUAMOUS EPITHELIAL CELLS PRESENT FEW GRAM POSITIVE COCCI RARE GRAM VARIABLE ROD    Culture   Final    RARE Normal respiratory flora-no Staph aureus or Pseudomonas seen Performed at [facility], [place] [address], [place], [place] [address]    Report Status 05/21/2020 FINAL  Final  Culture, blood (routine x 2)     Status: Abnormal (Preliminary result)   Collection Time: 05/31/20  9:28 AM   Specimen: BLOOD  Result Value Ref Range Status   Specimen Description BLOOD LEFT ANTECUBITAL  Final   Special Requests   Final    BOTTLES DRAWN AEROBIC AND ANAEROBIC Blood Culture results may not be optimal due to an inadequate volume of blood received in culture bottles   Culture  Setup Time (A)  Final    YEAST IN BOTH AEROBIC AND ANAEROBIC BOTTLES CRITICAL RESULT CALLED TO, READ BACK BY AND VERIFIED WITH: Ivey [id number] FCP    Culture (A)  Final    CANDIDA GLABRATA Sent to [facility] for further susceptibility testing. Performed at [facility], [place] [address], [place], [place] [address]    Report Status PENDING  Incomplete  Blood Culture ID Panel (Reflexed)     Status: Abnormal   Collection Time: 05/31/20  9:28 AM  Result Value Ref Range Status   Enterococcus faecalis  NOT DETECTED NOT DETECTED Final   Enterococcus Faecium NOT DETECTED NOT DETECTED Final  Listeria monocytogenes NOT DETECTED NOT DETECTED Final   Staphylococcus species NOT DETECTED NOT DETECTED Final   Staphylococcus aureus (BCID) NOT DETECTED NOT DETECTED Final   Staphylococcus epidermidis NOT DETECTED NOT DETECTED Final   Staphylococcus lugdunensis NOT DETECTED NOT DETECTED Final   Streptococcus species NOT DETECTED NOT DETECTED Final   Streptococcus agalactiae NOT DETECTED NOT DETECTED Final   Streptococcus pneumoniae NOT DETECTED NOT DETECTED Final   Streptococcus pyogenes NOT DETECTED NOT DETECTED Final   A.calcoaceticus-baumannii NOT DETECTED NOT DETECTED Final   Bacteroides fragilis NOT DETECTED NOT DETECTED Final   Enterobacterales NOT DETECTED NOT DETECTED Final   Enterobacter cloacae complex NOT DETECTED NOT DETECTED Final   Escherichia coli NOT DETECTED NOT DETECTED Final   Klebsiella aerogenes NOT DETECTED NOT DETECTED Final   Klebsiella oxytoca NOT DETECTED NOT DETECTED Final   Klebsiella pneumoniae NOT DETECTED NOT DETECTED Final   Proteus species NOT DETECTED NOT DETECTED Final   Salmonella species NOT DETECTED NOT DETECTED Final   Serratia marcescens NOT DETECTED NOT DETECTED Final   Haemophilus influenzae NOT DETECTED NOT DETECTED Final   Neisseria meningitidis NOT DETECTED NOT DETECTED Final   Pseudomonas aeruginosa NOT DETECTED NOT DETECTED Final   Stenotrophomonas maltophilia NOT DETECTED NOT DETECTED Final   Candida albicans NOT DETECTED NOT DETECTED Final   Candida auris NOT DETECTED NOT DETECTED Final   Candida glabrata DETECTED (A) NOT DETECTED Final    Comment: CRITICAL RESULT CALLED TO, READ BACK BY AND VERIFIED WITH: PHARMD THOMAS J. 1326 [id number] FCP    Candida krusei NOT DETECTED NOT DETECTED Final   Candida parapsilosis NOT DETECTED NOT DETECTED Final   Candida tropicalis NOT DETECTED NOT DETECTED Final   Cryptococcus neoformans/gattii NOT DETECTED NOT  DETECTED Final    Comment: Performed at [facility], [address] [place] [address] [place], [place] [address]  Antifungal AST 9 Drug Panel     Status: None   Collection Time: 05/31/20  9:28 AM  Result Value Ref Range Status   Organism ID, Yeast Candida glabrata  Corrected    Comment: (NOTE) Identification performed by account, not confirmed by this laboratory. CORRECTED ON 03/22 AT 1036: PREVIOUSLY REPORTED AS Preliminary report    Amphotericin B MIC 1.0 ug/mL  Final    Comment: (NOTE) *Breakpoints have been established for only some organism-drug combinations as indicated. Results of this test are labeled for research purposes only by the assay's manufacturer. The performance characteristics of this assay have not been established by the manufacturer. The result should not be used for treatment or for diagnostic purposes without confirmation of the diagnosis by another medically established diagnostic product or procedure. The performance characteristics were determined by [facility].    Please Note: PENDING  Incomplete   Anidulafungin MIC Comment  Final    Comment: (NOTE) 0.03 ug/mL Susceptible *Breakpoints have been established for only some organism-drug combinations as indicated. Results of this test are labeled for research purposes only by the assay's manufacturer. The performance characteristics of this assay have not been established by the manufacturer. The result should not be used for treatment or for diagnostic purposes without confirmation of the diagnosis by another medically established diagnostic product or procedure. The performance characteristics were determined by [facility].    Caspofungin MIC Comment  Final    Comment: 0.06 ug/mL Susceptible   Micafungin MIC Comment  Final    Comment: 0.016 ug/mL Susceptible   Posaconazole MIC 1.0 ug/mL  Final    Comment: (NOTE) *Breakpoints have been established for  only some organism-drug combinations as indicated. Results of  this test are labeled for research purposes only by the assay's manufacturer. The performance characteristics of this assay have not been established by the manufacturer. The result should not be used for treatment or for diagnostic purposes without confirmation of the diagnosis by another medically established diagnostic product or procedure. The performance characteristics were determined by [facility].    Please Note: PENDING  Incomplete   Fluconazole Islt MIC 16.0 ug/mL  Final    Comment: Susceptible Dose Dependent   Flucytosine MIC 0.06 ug/mL or less  Final   Itraconazole MIC 1.0 ug/mL  Final   Voriconazole MIC 0.5 ug/mL  Final    Comment: (NOTE) Performed At: [facility] [place], [place] [id number] Rush Farmer MD VE:[id number]    Source PENDING  Incomplete  Culture, blood (routine x 2)     Status: Abnormal (Preliminary result)   Collection Time: 05/31/20  9:36 AM   Specimen: BLOOD LEFT HAND  Result Value Ref Range Status   Specimen Description BLOOD LEFT HAND  Final   Special Requests   Final    BOTTLES DRAWN AEROBIC AND ANAEROBIC Blood Culture results may not be optimal due to an inadequate volume of blood received in culture bottles   Culture  Setup Time   Final    YEAST IN BOTH AEROBIC AND ANAEROBIC BOTTLES CRITICAL VALUE NOTED.  VALUE IS CONSISTENT WITH PREVIOUSLY REPORTED AND CALLED VALUE. Performed at [facility], [place] [address] [place], [place] [address]    Culture CANDIDA GLABRATA (A)  Final   Report Status PENDING  Incomplete  MRSA PCR Screening     Status: None   Collection Time: 05/31/20  3:52 PM   Specimen: Nasal Mucosa; Nasopharyngeal  Result Value Ref Range Status   MRSA by PCR NEGATIVE NEGATIVE Final    Comment:        The GeneXpert MRSA Assay (FDA approved for NASAL specimens only), is one component of a comprehensive MRSA colonization surveillance program. It is not intended to diagnose MRSA infection nor to guide  or monitor treatment for MRSA infections. Performed at [facility], [place] [address] [place], [place] [address]   Culture, blood (Routine X 2) w Reflex to ID Panel     Status: None (Preliminary result)   Collection Time: 06/02/20 10:08 AM   Specimen: BLOOD  Result Value Ref Range Status   Specimen Description BLOOD LEFT ANTECUBITAL  Final   Special Requests   Final    BOTTLES DRAWN AEROBIC ONLY Blood Culture adequate volume   Culture  Setup Time   Final    YEAST AEROBIC BOTTLE ONLY CRITICAL VALUE NOTED.  VALUE IS CONSISTENT WITH PREVIOUSLY REPORTED AND CALLED VALUE.    Culture   Final    YEAST TOO YOUNG TO READ Performed at [place], [place] [address] [place], [place] [address]    Report Status PENDING  Incomplete  Culture, blood (Routine X 2) w Reflex to ID Panel     Status: None (Preliminary result)   Collection Time: 06/02/20 10:08 AM   Specimen: BLOOD LEFT HAND  Result Value Ref Range Status   Specimen Description BLOOD LEFT HAND  Final   Special Requests   Final    BOTTLES DRAWN AEROBIC ONLY Blood Culture adequate volume   Culture  Setup Time   Final    YEAST WITH PSEUDOHYPHAE AEROBIC BOTTLE ONLY CRITICAL RESULT CALLED TO, READ BACK BY AND  VERIFIED WITH: J. FRENS PHARMD, AT 0277 06/05/20 [place]    Culture   Final    YEAST CULTURE REINCUBATED FOR BETTER GROWTH Performed at [facility], [place] [address] [place], [place] [address]    Report Status PENDING  Incomplete  Culture, blood (routine x 2)     Status: Abnormal (Preliminary result)   Collection Time: 06/04/20  3:19 PM   Specimen: BLOOD  Result Value Ref Range Status   Specimen Description BLOOD LEFT ANTECUBITAL  Final   Special Requests   Final    BOTTLES DRAWN AEROBIC AND ANAEROBIC Blood Culture results may not be optimal due to an inadequate volume of blood received in culture bottles   Culture  Setup Time   Final    GRAM POSITIVE COCCI IN CLUSTERS ANAEROBIC BOTTLE ONLY CRITICAL  RESULT CALLED TO, READ BACK BY AND VERIFIED WITH: PHARMD [place] C 2000 [id number] FCP    Culture (A)  Final    STAPHYLOCOCCUS EPIDERMIDIS THE SIGNIFICANCE OF ISOLATING THIS ORGANISM FROM A SINGLE SET OF BLOOD CULTURES WHEN MULTIPLE SETS ARE DRAWN IS UNCERTAIN. PLEASE NOTIFY THE MICROBIOLOGY DEPARTMENT WITHIN ONE WEEK IF SPECIATION AND SENSITIVITIES ARE REQUIRED. Performed at [facility], [place] [address], [place],  [address]    Report Status PENDING  Incomplete  Blood Culture ID Panel (Reflexed)     Status: Abnormal   Collection Time: 06/04/20  3:19 PM  Result Value Ref Range Status   Enterococcus faecalis NOT DETECTED NOT DETECTED Final   Enterococcus Faecium NOT DETECTED NOT DETECTED Final   Listeria monocytogenes NOT DETECTED NOT DETECTED Final   Staphylococcus species DETECTED (A) NOT DETECTED Final    Comment: CRITICAL RESULT CALLED TO, READ BACK BY AND VERIFIED WITH: PHARMD JESSICA C 2000 [id number] FCP    Staphylococcus aureus (BCID) NOT DETECTED NOT DETECTED Final   Staphylococcus epidermidis DETECTED (A) NOT DETECTED Final    Comment: Methicillin (oxacillin) resistant coagulase negative staphylococcus. Possible blood culture contaminant (unless isolated from more than one blood culture draw or clinical case suggests pathogenicity). No antibiotic treatment is indicated for blood  culture contaminants. CRITICAL RESULT CALLED TO, READ BACK BY AND VERIFIED WITH: PHARMD JESSICA C 2000 [id number] FCP    Staphylococcus lugdunensis NOT DETECTED NOT DETECTED Final   Streptococcus species NOT DETECTED NOT DETECTED Final   Streptococcus agalactiae NOT DETECTED NOT DETECTED Final   Streptococcus pneumoniae NOT DETECTED NOT DETECTED Final   Streptococcus pyogenes NOT DETECTED NOT DETECTED Final   A.calcoaceticus-baumannii NOT DETECTED NOT DETECTED Final   Bacteroides fragilis NOT DETECTED NOT DETECTED Final   Enterobacterales NOT DETECTED NOT DETECTED Final   Enterobacter cloacae  complex NOT DETECTED NOT DETECTED Final   Escherichia coli NOT DETECTED NOT DETECTED Final   Klebsiella aerogenes NOT DETECTED NOT DETECTED Final   Klebsiella oxytoca NOT DETECTED NOT DETECTED Final   Klebsiella pneumoniae NOT DETECTED NOT DETECTED Final   Proteus species NOT DETECTED NOT DETECTED Final   Salmonella species NOT DETECTED NOT DETECTED Final   Serratia marcescens NOT DETECTED NOT DETECTED Final   Haemophilus influenzae NOT DETECTED NOT DETECTED Final   Neisseria meningitidis NOT DETECTED NOT DETECTED Final   Pseudomonas aeruginosa NOT DETECTED NOT DETECTED Final   Stenotrophomonas maltophilia NOT DETECTED NOT DETECTED Final   Candida albicans NOT DETECTED NOT DETECTED Final   Candida auris NOT DETECTED NOT DETECTED Final   Candida glabrata NOT DETECTED NOT DETECTED Final   Candida krusei NOT DETECTED NOT DETECTED Final  Candida parapsilosis NOT DETECTED NOT DETECTED Final   Candida tropicalis NOT DETECTED NOT DETECTED Final   Cryptococcus neoformans/gattii NOT DETECTED NOT DETECTED Final   Methicillin resistance mecA/C DETECTED (A) NOT DETECTED Final    Comment: CRITICAL RESULT CALLED TO, READ BACK BY AND VERIFIED WITH: PHARMD JESSICA C 2000 [id number] FCP Performed at [facility], [place] [address], [place], [place] [address]   Culture, blood (routine x 2)     Status: None (Preliminary result)   Collection Time: 06/04/20  3:20 PM   Specimen: BLOOD RIGHT HAND  Result Value Ref Range Status   Specimen Description BLOOD RIGHT HAND  Final   Special Requests   Final    BOTTLES DRAWN AEROBIC ONLY Blood Culture results may not be optimal due to an inadequate volume of blood received in culture bottles   Culture   Final    NO GROWTH 2 DAYS Performed at [facility], [place] [address] [place], [place] [address]    Report Status PENDING  Incomplete    Studies/Results: DG CHEST PORT 1 VIEW  Result Date: 06/06/2020 CLINICAL DATA:  70 year old male with respiratory  distress. EXAM: PORTABLE CHEST 1 VIEW COMPARISON:  Chest radiograph dated 06/04/2020. FINDINGS: Feeding tube extends below the diaphragm. No interval change in the patchy left lung base density compared to prior radiograph. No large pleural effusion. No pneumothorax. Stable cardiomediastinal silhouette. Atherosclerotic calcification of the aorta. Median sternotomy wires and CABG vascular clips. No acute osseous pathology. Old healed right posterior rib fractures. IMPRESSION: No interval change. Electronically Signed   By: Anner Crete M.D.   On: 06/06/2020 02:45   ECHOCARDIOGRAM LIMITED BUBBLE STUDY  Result Date: 06/04/2020    ECHOCARDIOGRAM LIMITED REPORT   Patient Name:   Jason Jason [name] Date of Exam: 06/04/2020 Medical Rec #:  [id number]  Height:       70.0 in Accession #:    [id number] Weight:       222.4 lb Date of Birth:  [birth year]  BSA:          2.184 m Patient Age:    18 years   BP:           103/65 mmHg Patient Gender: M          HR:           77 bpm. Exam Location:  Inpatient Procedure: Limited Echo and Saline Contrast Bubble Study Indications:    evaluation to r/o endocarditis  History:        Patient has prior history of Echocardiogram examinations, most                 recent 05/12/2020. CAD and Previous Myocardial Infarction,                 Stroke; Risk Factors:Hypertension and Diabetes. Can do limited                 with focus on valves evaluation to r/o endocarditis.  Sonographer:    Alvino Chapel RCS Referring Phys: [id number] [place]  1. Left ventricular ejection fraction, by estimation, is 60 to 65%. The left ventricle has normal function. The left ventricle has no regional wall motion abnormalities.  2. Right ventricular systolic function is normal. The right ventricular size is normal.  3. The mitral valve is degenerative. No evidence of mitral valve regurgitation. No evidence of mitral stenosis.  4. The aortic valve is calcified. There is moderate calcification of the aortic  valve. There is moderate thickening of the aortic valve. Aortic valve regurgitation is not visualized. Mild to moderate aortic valve sclerosis/calcification is present, without any evidence of aortic stenosis.  5. The inferior vena cava is normal in size with greater than 50% respiratory variability, suggesting right atrial pressure of 3 mmHg.  6. Agitated saline contrast bubble study was negative, with no evidence of any interatrial shunt.  7. No obvious vegetation noted but if clinical suspcion high, recommend TEE. FINDINGS  Left Ventricle: Left ventricular ejection fraction, by estimation, is 60 to 65%. The left ventricle has normal function. The left ventricle has no regional wall motion abnormalities. The left ventricular internal cavity size was normal in size. There is  no left ventricular hypertrophy. Right Ventricle: The right ventricular size is normal. No increase in right ventricular wall thickness. Right ventricular systolic function is normal. Left Atrium: Left atrial size was normal in size. Right Atrium: Right atrial size was normal in size. Pericardium: There is no evidence of pericardial effusion. Mitral Valve: The mitral valve is degenerative in appearance. There is mild thickening of the anterior mitral valve leaflet(s). There is mild calcification of the anterior mitral valve leaflet(s). Mild mitral annular calcification. No evidence of mitral valve stenosis. Tricuspid Valve: The tricuspid valve is normal in structure. Tricuspid valve regurgitation is not demonstrated. No evidence of tricuspid stenosis. Aortic Valve: The aortic valve is calcified. There is moderate calcification of the aortic valve. There is moderate thickening of the aortic valve. Aortic valve regurgitation is not visualized. Mild to moderate aortic valve sclerosis/calcification is present, without any evidence of aortic stenosis. Pulmonic Valve: The pulmonic valve was normal in structure. Pulmonic valve regurgitation is not  visualized. No evidence of pulmonic stenosis. Aorta: The aortic root is normal in size and structure. Venous: The inferior vena cava is normal in size with greater than 50% respiratory variability, suggesting right atrial pressure of 3 mmHg. IAS/Shunts: No atrial level shunt detected by color flow Doppler. Agitated saline contrast was given intravenously to evaluate for intracardiac shunting. Agitated saline contrast bubble study was negative, with no evidence of any interatrial shunt. Jason Him MD Electronically signed by Jason Him MD Signature Date/Time: 06/04/2020/2:53:15 PM    Final       Assessment/Plan:  INTERVAL HISTORY: Ophthalmology if seen the patient there is no evidence of fungal endophthalmitis.  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 male with Struve coronary disease diabetes mellitus paroxysmal atrial fibrillation stroke systolic heart failure who was admitted to the ICU with thalamic hemorrhage with intraventricular extension that required reversal of his anticoagulation intubation and placement of an external ventricular drain.  He was then reintubated but fortunately successfully extubated.  Drain has been out.  He does not appear to have had any central lines.  He became febrile to 102.8 and blood cultures were taken and he was started on vancomycin and Zosyn.  Chest x-ray it showed a possible left lower infiltrate he was narrowed to Zosyn in the interim his blood cultures  turned positive for Candida glabrata  #1 Candida  glabrata fungemia: not clear what source was perhaps decubitus ulcer. I have not seen Candidal meningitis but he did have drain placed (it does not appear overtly infected and MRI brain unremarkable for infection  Certainly appreciate Dr. Valetta Close excluding fungal endophthalmitis  His Candida glabrata has dose-dependent sensitivity to fluconazole so we will place Jason [name] on high-dose fluconazole which will also penetrate CNS   #2 Goals of care: Had an extensive discussion with patient's oldest son Dominica Severin regarding yesterday      LOS: 26 days   Alcide Evener 06/06/2020, 12:13 PM

## 2020-06-06 NOTE — Progress Notes (Signed)
ABG sent to lab, lab made aware. 

## 2020-06-06 NOTE — Progress Notes (Signed)
Date of note: 06/06/2020  Subjective: The patient was noted to have worsening scrotal swelling especially on the left with mild tenderness as well as left thigh edema and tenderness left leg with abdominal wall swelling and edema and generalized abdominal distention.  No fever or chills.  No worsening dyspnea or respiratory distress.  Is maintaining his pulse oximetry.  No nausea or vomiting.  Is tolerating his tube feedings.  Notes and labs were reviewed.  Objective: Physical examination: Generally: Acutely ill looking, responding to verbal sounds, nonverbal, tachypneic in no acute respiratory distress. Vital signs: Blood pressure was 128/87 with heart rate of 94 and respiratory rate of 40 that is similar to the rest of the day with temperature of 98.4 and pulse currently 98% on current FiO2 by Ventimask. Head - atraumatic, normocephalic.  Pupils - equal, round and reactive to light and accommodation. Extraocular movements are intact. No scleral icterus.  Oropharynx - moist mucous membranes and tongue. No pharyngeal erythema or exudate.  Neck - supple. No JVD. Carotid pulses 2+ bilaterally. No carotid bruits. No palpable thyromegaly or lymphadenopathy. Cardiovascular - regular rate and rhythm. Normal S1 and S2. No murmurs, gallops or rubs.  Lungs -slight diminished bibasal breath sounds.   Abdomen - soft distended, nontender with slightly diminished  bowel sounds and abdominal wall edema with mild shifting dullness and no palpable organomegaly or masses.  Extremities -left leg and thigh tender swelling with trace to 1+ left thigh pitting edema with no clubbing or cyanosis.  Neuro -he opens eyes to sound and tracks.  He is nonverbal and does not follow commands. GU: Significant enlarged scrotal with  scrotal edema and mild tenderness on the left side.  Labs and radiographic studies were reviewed.  Assessment/plan: 1.  Left scrotal and thigh as well as lower abdominal swelling with associated  tenderness and left leg tense edema.  We will need to rule out DVT.  The patient could be having developing anasarca that  could be partly related to hypoalbuminemia with a last albumin of 1.7.  This could be also related to hepatorenal syndrome. -We will obtain a bilateral lower extremity venous Doppler to rule out DVT.  The patient's Eliquis was discontinued given his left thalamic hemorrhage with intraventricular extension and Lovenox was discontinued given his thrombocytopenia. -We will diurese with IV Lasix. -We will replace potassium especially given today's hypokalemia. -IV morphine sulfate will be given for pain.  2.  Acute hypoxic respiratory failure secondary to hydrocephalus and intracranial hemorrhage with persistent tachypnea. -Continue current O2 protocol.  If he has worsening hypoxia will need to evaluate for acute PE.  3.  Suspected acute on chronic systolic CHF.  -We will diurese as mentioned above and follow strict I's and O's.  We will continue other current plan of care.  Authorized and performed by: Valente David, MD Total critical care time: Approximately 40     minutes. Due to a high probability of clinically significant, life-threatening deterioration, the patient required my highest level of preparedness to intervene emergently and I personally spent this critical care time directly and personally managing the patient.  This critical care time included obtaining a history, examining the patient, pulse oximetry, ordering and review of studies, arranging urgent treatment with development of management plan, evaluation of patient's response to treatment, frequent reassessment, and discussions with other providers. This critical care time was performed to assess and manage the high probability of imminent, life-threatening deterioration that could result in multiorgan failure.  It was exclusive  of separately billable procedures and treating other patients and teaching  time.  Please see MDM section and the rest of the note for further information on patient assessment and treatment.

## 2020-06-06 NOTE — Progress Notes (Addendum)
PROGRESS NOTE    Jason [name]   ACZ:[id number]  DOB: [birth year]  PCP: Jodi Marble, MD    DOA: 05/11/2020 LOS: 72   Brief Narrative   70 year old Male with history of CAD, DM-2, PAF on Eliquis, CVA, HTN and systolic CHF admitted to ICU on 05/11/2020 with >1.2cm thalamic hemorrhage with intraventricular extension in the setting of hypertensive emergency and acute hydrocephalus.  He received Kcentra for Eliquis reversal.  He was intubated and had external ventricular drain placed on 05/12/2020.  He was extubated on 05/16/2020.  EVD dislodged on 05/20/2020.  He was reintubated on 3/5, and eventually extubated to BiPAP on 3/8. -He was transferred from [facility] to [facility] service on 3/12, patient continues to be persistently encephalopathic.  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 Fib, received KCentra for Eliquis reversal in ED -Followed by neurosurgery and neurology, had external ventricular drain from 2/25-3/5.  -EEG noted generalized slowing, c/w diffuse encephalopathy, continued on Keppra for seizure prophylaxis -remains on TFs via cortrak,  3/18-19 had some improvement in his mental status, subsequently started on a dysphagia 1 diet  - eating 30% of his meals if fed,  no plans for PEG tube at this time -Remains on lower rate tube feeds via cortrak, until po intake improves -if remains afebrile, stable resp status and po intake improves discharge to SNF for rehab  Candida glabrata fungemia -Febrile 3/16, blood cx+ for Candida  glabrata -Appreciate infectious disease input, was on Eraxis and fluconazole, discussed with neurology highly unlikely to have fungal meningitis -MRI brain without LM enhancement to suggest meningitis, decubitus wounds could be source of fungemia -continue Fluconazole, changed to PO now, duration per ID -ID recommended ophthalmology evaluation to exclude fungal endophthalmitis, appreciate Ophthalmology eval by Dr.Bowen, negative for endophthalmitis  -repeat blood Cx 3/18 w/ yeast again -fevers improving  Acute respiratory failure with hypoxia  -Secondary to hydrocephalus and intracranial hemorrhage, - intubation and mechanical ventilation from 2/25-3/1, reintubated 3/5-3/8 - continues to be intermittently tachypnic, seen by PCCM multiple times  Persistent A. fib/A flutter: -Was on Eliquis POA. -Eliquis discontinued in the setting of intracranial hemorrhage -Blood pressure stabilized, continue Cardizem and metoprolol  Chronic systolic CHF:  - LVEF 45 to 50%.  - no overt fluid overload. -Monitor off diuretics at this time  Hypernatremia:  -Trending up again, increase free water flushes  Hypertensive emergency/uncontrolled hypertension:  BP 205/120 on admit.    -now normotensive, continue Cardizem and metoprolol  History of CAD: Stable -Cardiac meds as above.  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 Multiple blisters along (Active)  05/24/20 0800  Location: Buttocks  Location Orientation: Bilateral;Medial  Staging: Deep Tissue  Pressure Injury - Purple or maroon localized area of discolored intact skin or blood-filled blister due to  damage of underlying soft tissue from pressure and/or shear.  Wound Description (Comments): Multiple blisters along with deep tissue injury.  Present on Admission:     Obesity: Body mass index is 31.92 kg/m.  Complicates overall care and prognosis.  DVT prophylaxis: SCDs   CODE STATUS: DNR Family communication: attempted to reach son Dominica Severin twice today via his cell, no response  Subjective   -no further fevers, opens eyes, no events overnight  Disposition Plan & Communication   Status is: Inpatient  Inpatient status is appropriate due to severity of illness  Dispo: The patient is from: Home              Anticipated d/c is to: SNF              Patient currently is not medically stable for d/c   Difficult to place patient - No   Consults, Procedures, Significant Events   Consultants:  Neurosurgery PCCM Infectious disease Cardiology Neurology Palliative medicine  Procedures:  ETT 2/25 >> 3/1, 3/5 >> 3/8 EVD 2/25 >> 3/5  Antimicrobials:  Anti-infectives (From admission, onward)   Start     Dose/Rate Route Frequency Ordered Stop   06/06/20 1200  fluconazole (DIFLUCAN) tablet 800 mg        800 mg Oral Daily 06/06/20 1110     06/03/20 1300  anidulafungin (ERAXIS) 100 mg in sodium chloride 0.9 % 100 mL IVPB  Status:  Discontinued       "Followed by" Linked Group Details   100 mg 78 mL/hr over 100 Minutes Intravenous Every 24 hours 06/02/20 1119 06/06/20 1110   06/02/20 1600  fluconazole (DIFLUCAN) IVPB 400 mg  Status:  Discontinued        400 mg 100 mL/hr over 120 Minutes Intravenous Every 24 hours 06/02/20 1119 06/02/20 1712   06/02/20 1300  anidulafungin (ERAXIS) 200 mg in sodium chloride 0.9 % 200 mL IVPB       "Followed by" Linked Group Details   200 mg 78 mL/hr over 200 Minutes Intravenous  Once 06/02/20 1119 06/02/20 2142   06/02/20 1200  anidulafungin  (ERAXIS) 100 mg in sodium chloride 0.9 % 100 mL IVPB  Status:  Discontinued        100 mg 78 mL/hr over 100 Minutes Intravenous Every 24 hours 06/01/20 1230 06/01/20 1249   06/02/20 1000  voriconazole (VFEND) tablet 200 mg  Status:  Discontinued        200 mg Oral Every 12 hours 06/01/20 1347 06/01/20 1436   06/02/20 1000  voriconazole (VFEND) tablet 200 mg  Status:  Discontinued        200 mg Per Tube Every 12 hours 06/01/20 1436 06/02/20 1119   06/01/20 1445  voriconazole (VFEND) tablet 400 mg        400 mg Per Tube Every 12 hours 06/01/20 1436 06/01/20 2145   06/01/20 1430  voriconazole (VFEND) tablet 400 mg  Status:  Discontinued        400 mg Oral Every 12 hours 06/01/20 1347 06/01/20 1436   06/01/20 1330  anidulafungin (ERAXIS) 200 mg in sodium chloride 0.9 % 200 mL IVPB  Status:  Discontinued        200 mg 78 mL/hr over 200 Minutes Intravenous  Once 06/01/20 1230 06/01/20 1249   06/01/20 1300  vancomycin (VANCOREADY) IVPB 1000 mg/200 mL  Status:  Discontinued        1,000 mg 200 mL/hr over 60 Minutes Intravenous Every 24 hours 05/31/20 1323 06/01/20 4097  05/31/20 1900  piperacillin-tazobactam (ZOSYN) IVPB 3.375 g  Status:  Discontinued        3.375 g 12.5 mL/hr over 240 Minutes Intravenous Every 8 hours 05/31/20 1012 06/02/20 1346   05/31/20 1100  vancomycin (VANCOREADY) IVPB 2000 mg/400 mL        2,000 mg 200 mL/hr over 120 Minutes Intravenous  Once 05/31/20 1012 05/31/20 1634   05/31/20 1100  piperacillin-tazobactam (ZOSYN) IVPB 3.375 g        3.375 g 100 mL/hr over 30 Minutes Intravenous  Once 05/31/20 1012 05/31/20 1406   05/21/20 1100  vancomycin (VANCOREADY) IVPB 1750 mg/350 mL  Status:  Discontinued        1,750 mg 175 mL/hr over 120 Minutes Intravenous Every 24 hours 05/20/20 0953 05/23/20 1046   05/20/20 1045  vancomycin (VANCOREADY) IVPB 2000 mg/400 mL        2,000 mg 200 mL/hr over 120 Minutes Intravenous  Once 05/20/20 0948 05/20/20 1341   05/19/20 1015   cefTRIAXone (ROCEPHIN) 2 g in sodium chloride 0.9 % 100 mL IVPB  Status:  Discontinued        2 g 200 mL/hr over 30 Minutes Intravenous Every 24 hours 05/18/20 0958 05/18/20 1132   05/18/20 1400  piperacillin-tazobactam (ZOSYN) IVPB 3.375 g  Status:  Discontinued        3.375 g 12.5 mL/hr over 240 Minutes Intravenous Every 8 hours 05/18/20 1132 05/23/20 1046   05/17/20 1015  cefTRIAXone (ROCEPHIN) 1 g in sodium chloride 0.9 % 100 mL IVPB  Status:  Discontinued        1 g 200 mL/hr over 30 Minutes Intravenous Every 24 hours 05/17/20 0918 05/18/20 0958   05/12/20 1530  ceFAZolin (ANCEF) IVPB 1 g/50 mL premix  Status:  Discontinued        1 g 100 mL/hr over 30 Minutes Intravenous Every 8 hours 05/12/20 1440 05/17/20 0918        Micro    Objective   Vitals:   06/06/20 0138 06/06/20 0352 06/06/20 0726 06/06/20 1147  BP:  138/86 135/90 127/76  Pulse:  (!) 114 (!) 124 100  Resp:  (!) 50 (!) 46 (!) 40  Temp:  98.9 F (37.2 C) 98.9 F (37.2 C) 98.3 F (36.8 C)  TempSrc:  Oral Axillary Axillary  SpO2: 94% 98%    Weight:      Height:        Intake/Output Summary (Last 24 hours) at 06/06/2020 1359 Last data filed at 06/06/2020 1300 Gross per 24 hour  Intake 460 ml  Output 1000 ml  Net -540 ml   Filed Weights   06/02/20 0500 06/03/20 0500 06/04/20 0500  Weight: 104.3 kg 102.5 kg 100.9 kg    Physical Exam: Chronically ill elderly male laying in bed, somnolent but opens eyes, intermittently follows some commands HEENT: Pupils are equal and reactive, cortrak with tube feeds  CVS: S1-S2, regular rate rhythm Lungs: Few scattered rhonchi Abdomen: Soft, nontender, bowel sounds present Extremities: No edema Skin: Sacral decubitus wounds  Neuro: Somnolent but opens eyes, tracks, follows few simple commands  Labs    CBC: Recent Labs  Lab 06/02/20 0404 06/03/20 0117 06/04/20 0326 06/05/20 0433 06/06/20 0435  WBC 4.9 6.4 7.7 7.1 9.1  HGB 12.5* 12.3* 12.7* 14.7 12.8*  HCT  39.9 39.2 39.6 44.7 39.5  MCV 89.1 88.7 86.7 85.6 85.9  PLT 87* 79* 58* 43* 65*   Basic Metabolic Panel: Recent Labs  Lab 05/31/20 0331 06/01/20 0435  06/02/20 0404 06/03/20 0117 06/04/20 0326 06/05/20 0433 06/06/20 0435  NA 149*   < > 146* 146* 146* 146* 149*  K 3.8   < > 3.8 3.8 3.5 3.5 3.0*  CL 112*   < > 113* 116* 114* 116* 119*  CO2 28   < > 24 23 21* 23 21*  GLUCOSE 78   < > 216* 224* 154* 254* 199*  BUN 42*   < > 55* 56* 51* 53* 53*  CREATININE 1.49*   < > 2.16* 2.22* 1.85* 1.79* 1.79*  CALCIUM 8.1*   < > 7.5* 7.7* 7.9* 8.0* 8.0*  MG 2.8*  --   --   --   --   --   --    < > = values in this interval not displayed.   GFR: Estimated Creatinine Clearance: 45.1 mL/min (A) (by C-G formula based on SCr of 1.79 mg/dL (H)). Liver Function Tests: Recent Labs  Lab 05/31/20 0331 06/05/20 0433  AST 35 69*  ALT 46* 127*  ALKPHOS 66 385*  BILITOT 0.6 0.7  PROT 6.4* 5.8*  ALBUMIN 2.0* 1.7*   No results for input(s): LIPASE, AMYLASE in the last 168 hours. No results for input(s): AMMONIA in the last 168 hours. Coagulation Profile: No results for input(s): INR, PROTIME in the last 168 hours. Cardiac Enzymes: No results for input(s): CKTOTAL, CKMB, CKMBINDEX, TROPONINI in the last 168 hours. BNP (last 3 results) No results for input(s): PROBNP in the last 8760 hours. HbA1C: No results for input(s): HGBA1C in the last 72 hours. CBG: Recent Labs  Lab 06/05/20 2038 06/06/20 0020 06/06/20 0355 06/06/20 0740 06/06/20 1145  GLUCAP 286* 225* 204* 142* 184*   Lipid Profile: No results for input(s): CHOL, HDL, LDLCALC, TRIG, CHOLHDL, LDLDIRECT in the last 72 hours. Thyroid Function Tests: No results for input(s): TSH, T4TOTAL, FREET4, T3FREE, THYROIDAB in the last 72 hours. Anemia Panel: No results for input(s): VITAMINB12, FOLATE, FERRITIN, TIBC, IRON, RETICCTPCT in the last 72 hours. Sepsis Labs: Recent Labs  Lab 05/31/20 0933 05/31/20 1227 05/31/20 1534  06/01/20 0435 06/02/20 0404  PROCALCITON  --   --  2.35 3.50 2.52  LATICACIDVEN 2.0* 2.1*  --   --   --     Recent Results (from the past 240 hour(s))  Culture, blood (routine x 2)     Status: Abnormal (Preliminary result)   Collection Time: 05/31/20  9:28 AM   Specimen: BLOOD  Result Value Ref Range Status   Specimen Description BLOOD LEFT ANTECUBITAL  Final   Special Requests   Final    BOTTLES DRAWN AEROBIC AND ANAEROBIC Blood Culture results may not be optimal due to an inadequate volume of blood received in culture bottles   Culture  Setup Time (A)  Final    YEAST IN BOTH AEROBIC AND ANAEROBIC BOTTLES CRITICAL RESULT CALLED TO, READ BACK BY AND VERIFIED WITH: [place] J. 1326 [id number] FCP    Culture (A)  Final    CANDIDA GLABRATA Sent to [facility] for further susceptibility testing. Performed at [facility], [place] [address] [place],  [address]    Report Status PENDING  Incomplete  Blood Culture ID Panel (Reflexed)     Status: Abnormal   Collection Time: 05/31/20  9:28 AM  Result Value Ref Range Status   Enterococcus faecalis NOT DETECTED NOT DETECTED Final   Enterococcus Faecium NOT DETECTED NOT DETECTED Final   Listeria monocytogenes NOT DETECTED NOT DETECTED Final   Staphylococcus species  NOT DETECTED NOT DETECTED Final   Staphylococcus aureus (BCID) NOT DETECTED NOT DETECTED Final   Staphylococcus epidermidis NOT DETECTED NOT DETECTED Final   Staphylococcus lugdunensis NOT DETECTED NOT DETECTED Final   Streptococcus species NOT DETECTED NOT DETECTED Final   Streptococcus agalactiae NOT DETECTED NOT DETECTED Final   Streptococcus pneumoniae NOT DETECTED NOT DETECTED Final   Streptococcus pyogenes NOT DETECTED NOT DETECTED Final   A.calcoaceticus-baumannii NOT DETECTED NOT DETECTED Final   Bacteroides fragilis NOT DETECTED NOT DETECTED Final   Enterobacterales NOT DETECTED NOT DETECTED Final   Enterobacter cloacae complex NOT DETECTED NOT DETECTED Final    Escherichia coli NOT DETECTED NOT DETECTED Final   Klebsiella aerogenes NOT DETECTED NOT DETECTED Final   Klebsiella oxytoca NOT DETECTED NOT DETECTED Final   Klebsiella pneumoniae NOT DETECTED NOT DETECTED Final   Proteus species NOT DETECTED NOT DETECTED Final   Salmonella species NOT DETECTED NOT DETECTED Final   Serratia marcescens NOT DETECTED NOT DETECTED Final   Haemophilus influenzae NOT DETECTED NOT DETECTED Final   Neisseria meningitidis NOT DETECTED NOT DETECTED Final   Pseudomonas aeruginosa NOT DETECTED NOT DETECTED Final   Stenotrophomonas maltophilia NOT DETECTED NOT DETECTED Final   Candida albicans NOT DETECTED NOT DETECTED Final   Candida auris NOT DETECTED NOT DETECTED Final   Candida glabrata DETECTED (A) NOT DETECTED Final    Comment: CRITICAL RESULT CALLED TO, READ BACK BY AND VERIFIED WITH: PHARMD THOMAS J. 1326 [id number] FCP    Candida krusei NOT DETECTED NOT DETECTED Final   Candida parapsilosis NOT DETECTED NOT DETECTED Final   Candida tropicalis NOT DETECTED NOT DETECTED Final   Cryptococcus neoformans/gattii NOT DETECTED NOT DETECTED Final    Comment: Performed at [facility], [address], [place], [place] [address]  Antifungal AST 9 Drug Panel     Status: None   Collection Time: 05/31/20  9:28 AM  Result Value Ref Range Status   Organism ID, Yeast Candida glabrata  Corrected    Comment: (NOTE) Identification performed by account, not confirmed by this laboratory. CORRECTED ON 03/22 AT 1036: PREVIOUSLY REPORTED AS Preliminary report    Amphotericin B MIC 1.0 ug/mL  Final    Comment: (NOTE) *Breakpoints have been established for only some organism-drug combinations as indicated. Results of this test are labeled for research purposes only by the assay's manufacturer. The performance characteristics of this assay have not been established by the manufacturer. The result should not be used for treatment or for diagnostic purposes  without confirmation of the diagnosis by another medically established diagnostic product or procedure. The performance characteristics were determined by [facility].    Please Note: PENDING  Incomplete   Anidulafungin MIC Comment  Final    Comment: (NOTE) 0.03 ug/mL Susceptible *Breakpoints have been established for only some organism-drug combinations as indicated. Results of this test are labeled for research purposes only by the assay's manufacturer. The performance characteristics of this assay have not been established by the manufacturer. The result should not be used for treatment or for diagnostic purposes without confirmation of the diagnosis by another medically established diagnostic product or procedure. The performance characteristics were determined by [facility].    Caspofungin MIC Comment  Final    Comment: 0.06 ug/mL Susceptible   Micafungin MIC Comment  Final    Comment: 0.016 ug/mL Susceptible   Posaconazole MIC 1.0 ug/mL  Final    Comment: (NOTE) *Breakpoints have been established for only some organism-drug combinations as indicated. Results of this test  are labeled for research purposes only by the assay's manufacturer. The performance characteristics of this assay have not been established by the manufacturer. The result should not be used for treatment or for diagnostic purposes without confirmation of the diagnosis by another medically established diagnostic product or procedure. The performance characteristics were determined by [facility].    Please Note: PENDING  Incomplete   Fluconazole Islt MIC 16.0 ug/mL  Final    Comment: Susceptible Dose Dependent   Flucytosine MIC 0.06 ug/mL or less  Final   Itraconazole MIC 1.0 ug/mL  Final   Voriconazole MIC 0.5 ug/mL  Final    Comment: (NOTE) Performed At: [facility] [place], [place] [id number] Rush Farmer MD YS:[id number]    Source PENDING  Incomplete  Culture, blood (routine x  2)     Status: Abnormal (Preliminary result)   Collection Time: 05/31/20  9:36 AM   Specimen: BLOOD LEFT HAND  Result Value Ref Range Status   Specimen Description BLOOD LEFT HAND  Final   Special Requests   Final    BOTTLES DRAWN AEROBIC AND ANAEROBIC Blood Culture results may not be optimal due to an inadequate volume of blood received in culture bottles   Culture  Setup Time   Final    YEAST IN BOTH AEROBIC AND ANAEROBIC BOTTLES CRITICAL VALUE NOTED.  VALUE IS CONSISTENT WITH PREVIOUSLY REPORTED AND CALLED VALUE. Performed at [facility], [place] [address] [place], [place] [address]    Culture CANDIDA GLABRATA (A)  Final   Report Status PENDING  Incomplete  MRSA PCR Screening     Status: None   Collection Time: 05/31/20  3:52 PM   Specimen: Nasal Mucosa; Nasopharyngeal  Result Value Ref Range Status   MRSA by PCR NEGATIVE NEGATIVE Final    Comment:        The GeneXpert MRSA Assay (FDA approved for NASAL specimens only), is one component of a comprehensive MRSA colonization surveillance program. It is not intended to diagnose MRSA infection nor to guide or monitor treatment for MRSA infections. Performed at [facility], [place] [address], [place], [place] [address]   Culture, blood (Routine X 2) w Reflex to ID Panel     Status: None (Preliminary result)   Collection Time: 06/02/20 10:08 AM   Specimen: BLOOD  Result Value Ref Range Status   Specimen Description BLOOD LEFT ANTECUBITAL  Final   Special Requests   Final    BOTTLES DRAWN AEROBIC ONLY Blood Culture adequate volume   Culture  Setup Time   Final    YEAST AEROBIC BOTTLE ONLY CRITICAL VALUE NOTED.  VALUE IS CONSISTENT WITH PREVIOUSLY REPORTED AND CALLED VALUE.    Culture   Final    YEAST TOO YOUNG TO READ Performed at [facility], [place] [address] [place], [place] [address]    Report Status PENDING  Incomplete  Culture, blood (Routine X 2) w Reflex to ID Panel     Status: None (Preliminary  result)   Collection Time: 06/02/20 10:08 AM   Specimen: BLOOD LEFT HAND  Result Value Ref Range Status   Specimen Description BLOOD LEFT HAND  Final   Special Requests   Final    BOTTLES DRAWN AEROBIC ONLY Blood Culture adequate volume   Culture  Setup Time   Final    YEAST WITH PSEUDOHYPHAE AEROBIC BOTTLE ONLY CRITICAL RESULT CALLED TO, READ BACK BY AND VERIFIED WITH: J. FRENS PHARMD, AT 0623 06/05/20 D. Victoriano Lain  Culture   Final    YEAST CULTURE REINCUBATED FOR BETTER GROWTH Performed at [facility], [place] [address], [place], [place] [address]    Report Status PENDING  Incomplete  Culture, blood (routine x 2)     Status: Abnormal (Preliminary result)   Collection Time: 06/04/20  3:19 PM   Specimen: BLOOD  Result Value Ref Range Status   Specimen Description BLOOD LEFT ANTECUBITAL  Final   Special Requests   Final    BOTTLES DRAWN AEROBIC AND ANAEROBIC Blood Culture results may not be optimal due to an inadequate volume of blood received in culture bottles   Culture  Setup Time   Final    GRAM POSITIVE COCCI IN CLUSTERS ANAEROBIC BOTTLE ONLY CRITICAL RESULT CALLED TO, READ BACK BY AND VERIFIED WITH: PHARMD [place] C 2000 [id number] FCP    Culture (A)  Final    STAPHYLOCOCCUS EPIDERMIDIS THE SIGNIFICANCE OF ISOLATING THIS ORGANISM FROM A SINGLE SET OF BLOOD CULTURES WHEN MULTIPLE SETS ARE DRAWN IS UNCERTAIN. PLEASE NOTIFY THE MICROBIOLOGY DEPARTMENT WITHIN ONE WEEK IF SPECIATION AND SENSITIVITIES ARE REQUIRED. Performed at [facility], [place] [address], [place], [place] [address]    Report Status PENDING  Incomplete  Blood Culture ID Panel (Reflexed)     Status: Abnormal   Collection Time: 06/04/20  3:19 PM  Result Value Ref Range Status   Enterococcus faecalis NOT DETECTED NOT DETECTED Final   Enterococcus Faecium NOT DETECTED NOT DETECTED Final   Listeria monocytogenes NOT DETECTED NOT DETECTED Final   Staphylococcus species DETECTED (A) NOT DETECTED Final     Comment: CRITICAL RESULT CALLED TO, READ BACK BY AND VERIFIED WITH: PHARMD JESSICA C 2000 [id number] FCP    Staphylococcus aureus (BCID) NOT DETECTED NOT DETECTED Final   Staphylococcus epidermidis DETECTED (A) NOT DETECTED Final    Comment: Methicillin (oxacillin) resistant coagulase negative staphylococcus. Possible blood culture contaminant (unless isolated from more than one blood culture draw or clinical case suggests pathogenicity). No antibiotic treatment is indicated for blood  culture contaminants. CRITICAL RESULT CALLED TO, READ BACK BY AND VERIFIED WITH: PHARMD JESSICA C 2000 [id number] FCP    Staphylococcus lugdunensis NOT DETECTED NOT DETECTED Final   Streptococcus species NOT DETECTED NOT DETECTED Final   Streptococcus agalactiae NOT DETECTED NOT DETECTED Final   Streptococcus pneumoniae NOT DETECTED NOT DETECTED Final   Streptococcus pyogenes NOT DETECTED NOT DETECTED Final   A.calcoaceticus-baumannii NOT DETECTED NOT DETECTED Final   Bacteroides fragilis NOT DETECTED NOT DETECTED Final   Enterobacterales NOT DETECTED NOT DETECTED Final   Enterobacter cloacae complex NOT DETECTED NOT DETECTED Final   Escherichia coli NOT DETECTED NOT DETECTED Final   Klebsiella aerogenes NOT DETECTED NOT DETECTED Final   Klebsiella oxytoca NOT DETECTED NOT DETECTED Final   Klebsiella pneumoniae NOT DETECTED NOT DETECTED Final   Proteus species NOT DETECTED NOT DETECTED Final   Salmonella species NOT DETECTED NOT DETECTED Final   Serratia marcescens NOT DETECTED NOT DETECTED Final   Haemophilus influenzae NOT DETECTED NOT DETECTED Final   Neisseria meningitidis NOT DETECTED NOT DETECTED Final   Pseudomonas aeruginosa NOT DETECTED NOT DETECTED Final   Stenotrophomonas maltophilia NOT DETECTED NOT DETECTED Final   Candida albicans NOT DETECTED NOT DETECTED Final   Candida auris NOT DETECTED NOT DETECTED Final   Candida glabrata NOT DETECTED NOT DETECTED Final   Candida krusei NOT DETECTED NOT  DETECTED Final   Candida parapsilosis NOT DETECTED NOT DETECTED Final   Candida tropicalis NOT DETECTED  NOT DETECTED Final   Cryptococcus neoformans/gattii NOT DETECTED NOT DETECTED Final   Methicillin resistance mecA/C DETECTED (A) NOT DETECTED Final    Comment: CRITICAL RESULT CALLED TO, READ BACK BY AND VERIFIED WITH: PHARMD JESSICA C 2000 [id number] FCP Performed at [facility], [place] [address], [place], [place] [address]   Culture, blood (routine x 2)     Status: None (Preliminary result)   Collection Time: 06/04/20  3:20 PM   Specimen: BLOOD RIGHT HAND  Result Value Ref Range Status   Specimen Description BLOOD RIGHT HAND  Final   Special Requests   Final    BOTTLES DRAWN AEROBIC ONLY Blood Culture results may not be optimal due to an inadequate volume of blood received in culture bottles   Culture   Final    NO GROWTH 2 DAYS Performed at [facility], [place] [address] [place], [place] [address]    Report Status PENDING  Incomplete     Imaging Studies   DG CHEST PORT 1 VIEW  Result Date: 06/06/2020 CLINICAL DATA:  70 year old male with respiratory distress. EXAM: PORTABLE CHEST 1 VIEW COMPARISON:  Chest radiograph dated 06/04/2020. FINDINGS: Feeding tube extends below the diaphragm. No interval change in the patchy left lung base density compared to prior radiograph. No large pleural effusion. No pneumothorax. Stable cardiomediastinal silhouette. Atherosclerotic calcification of the aorta. Median sternotomy wires and CABG vascular clips. No acute osseous pathology. Old healed right posterior rib fractures. IMPRESSION: No interval change. Electronically Signed   By: Anner Crete M.D.   On: 06/06/2020 02:45   ECHOCARDIOGRAM LIMITED BUBBLE STUDY  Result Date: 06/04/2020    ECHOCARDIOGRAM LIMITED REPORT   Patient Name:   GIRARD KOONTZ Date of Exam: 06/04/2020 Medical Rec #:  [id number]  Height:       70.0 in Accession #:    [id number] Weight:       222.4 lb Date of Birth:   [birth year]  BSA:          2.184 m Patient Age:    51 years   BP:           103/65 mmHg Patient Gender: M          HR:           77 bpm. Exam Location:  Inpatient Procedure: Limited Echo and Saline Contrast Bubble Study Indications:    evaluation to r/o endocarditis  History:        Patient has prior history of Echocardiogram examinations, most                 recent 05/12/2020. CAD and Previous Myocardial Infarction,                 Stroke; Risk Factors:Hypertension and Diabetes. Can do limited                 with focus on valves evaluation to r/o endocarditis.  Sonographer:    Alvino Chapel RCS Referring Phys: [id number] [place]  1. Left ventricular ejection fraction, by estimation, is 60 to 65%. The left ventricle has normal function. The left ventricle has no regional wall motion abnormalities.  2. Right ventricular systolic function is normal. The right ventricular size is normal.  3. The mitral valve is degenerative. No evidence of mitral valve regurgitation. No evidence of mitral stenosis.  4. The aortic valve is calcified. There is moderate calcification of the aortic valve. There is moderate thickening of the aortic valve.  Aortic valve regurgitation is not visualized. Mild to moderate aortic valve sclerosis/calcification is present, without any evidence of aortic stenosis.  5. The inferior vena cava is normal in size with greater than 50% respiratory variability, suggesting right atrial pressure of 3 mmHg.  6. Agitated saline contrast bubble study was negative, with no evidence of any interatrial shunt.  7. No obvious vegetation noted but if clinical suspcion high, recommend TEE. FINDINGS  Left Ventricle: Left ventricular ejection fraction, by estimation, is 60 to 65%. The left ventricle has normal function. The left ventricle has no regional wall motion abnormalities. The left ventricular internal cavity size was normal in size. There is  no left ventricular hypertrophy. Right Ventricle: The  right ventricular size is normal. No increase in right ventricular wall thickness. Right ventricular systolic function is normal. Left Atrium: Left atrial size was normal in size. Right Atrium: Right atrial size was normal in size. Pericardium: There is no evidence of pericardial effusion. Mitral Valve: The mitral valve is degenerative in appearance. There is mild thickening of the anterior mitral valve leaflet(s). There is mild calcification of the anterior mitral valve leaflet(s). Mild mitral annular calcification. No evidence of mitral valve stenosis. Tricuspid Valve: The tricuspid valve is normal in structure. Tricuspid valve regurgitation is not demonstrated. No evidence of tricuspid stenosis. Aortic Valve: The aortic valve is calcified. There is moderate calcification of the aortic valve. There is moderate thickening of the aortic valve. Aortic valve regurgitation is not visualized. Mild to moderate aortic valve sclerosis/calcification is present, without any evidence of aortic stenosis. Pulmonic Valve: The pulmonic valve was normal in structure. Pulmonic valve regurgitation is not visualized. No evidence of pulmonic stenosis. Aorta: The aortic root is normal in size and structure. Venous: The inferior vena cava is normal in size with greater than 50% respiratory variability, suggesting right atrial pressure of 3 mmHg. IAS/Shunts: No atrial level shunt detected by color flow Doppler. Agitated saline contrast was given intravenously to evaluate for intracardiac shunting. Agitated saline contrast bubble study was negative, with no evidence of any interatrial shunt. Fransico Him MD Electronically signed by Fransico Him MD Signature Date/Time: 06/04/2020/2:53:15 PM    Final      Medications   Scheduled Meds: . chlorhexidine gluconate (MEDLINE KIT)  15 mL Mouth Rinse BID  . diltiazem  30 mg Oral Q8H  . feeding supplement (PROSource TF)  45 mL Per Tube TID  . fluconazole  800 mg Oral Daily  . free water   300 mL Per Tube Q4H  . insulin aspart  0-20 Units Subcutaneous Q4H  . insulin glargine  15 Units Subcutaneous Daily  . levETIRAcetam  750 mg Per Tube BID  . mouth rinse  15 mL Mouth Rinse q12n4p  . metoprolol tartrate  50 mg Per Tube BID  . pantoprazole sodium  40 mg Per Tube Daily  . thiamine  100 mg Per Tube Daily   Continuous Infusions: . sodium chloride    . feeding supplement (JEVITY 1.5 CAL/FIBER) 1,000 mL (06/06/20 0916)     LOS: 26 days   Time spent: 25 minutes   Domenic Polite, MD [facility]  06/06/2020, 1:59 PM

## 2020-06-06 NOTE — Significant Event (Addendum)
Rapid Response Event Note   Reason for Call :  Called as a second set of eyes d/t tachypnea.  MD notified earlier in night for tachypnea-ABG and PCXR ordered.   PCXR-no interval change ABG-7.41/34.6/97.8/21.7  Initial Focused Assessment:  Pt lying in bed with eyes closed. Pt will open eyes to verbal stimulation. R pupil 4 and sluggish, L pupil 5 and sluggish(baseline). Pt is tachypneic. Lungs with rhonchi t/o. Skin cool to touch.   T-98.9, HR-114, BP-138/86, RR-40, SpO2-98% on .35 venturi mask  Interventions:  RN to give dose of PRN morphine 2mg  for dyspnea  Plan of Care:  PCXR and ABG WNL. Pt's RR normally 30s. RN to give already ordered dose of prn morphine for dyspnea and continue to monitor pt. Please call RRT if further assistance needed.    Event Summary:   MD Notified:  Call Time:0405 Arrival End Time:0430  [id number], RN

## 2020-06-06 NOTE — Progress Notes (Signed)
SLP Cancellation Note  Patient Details Name: Jason [name] MRN: [id number] DOB: [birth year]   Cancelled treatment:       Reason Eval/Treat Not Completed: Fatigue/lethargy limiting ability to participate. Pt sleeping after am meal. NT reports that though pt is on venturi mask, he was alert and eager for breakfast and she did feed him. He ate about 30% of his meal without overt difficulty that she could observe. SLP attempted to re awaken t to check tolerance, but he is now sleeping deeply. Pt has been enjoying eating and drinking over the past two days though it is possible respiratory function has been impacted. His son was pleased yesterday to hear that he was eating and drinking. Given waxing and waning function, ongoing discussion of risk of eating and drinking on respiratory function should be had with pts family, as well as consideration of his comfort and obvious desire to eat and drink. Will continue to work with pt.    DeBlo[place], Riley Nearing 06/06/2020, 10:20 AM

## 2020-06-06 NOTE — Progress Notes (Signed)
Patient RR at 2330 45, Respiratory contacted and assessed status, Provider notified, and visited patient, MD placed verbal ordered for portable chest xray, ABG, and face mask. All orders completed. Reassessed RR post mask, RR 42-50 per minute, breaths even and shallow, 02 saturation 96% venturi mask 4L, Cap refill less than 3 seconds. Lung sounds diminished with minimal rhonchi in upper regions, diminished lower regions. HOB elevated,  Will continue to monitor.

## 2020-06-07 ENCOUNTER — Inpatient Hospital Stay (HOSPITAL_COMMUNITY): Payer: Medicare HMO

## 2020-06-07 DIAGNOSIS — J9601 Acute respiratory failure with hypoxia: Secondary | ICD-10-CM | POA: Diagnosis not present

## 2020-06-07 DIAGNOSIS — T17908D Unspecified foreign body in respiratory tract, part unspecified causing other injury, subsequent encounter: Secondary | ICD-10-CM | POA: Diagnosis not present

## 2020-06-07 DIAGNOSIS — R4182 Altered mental status, unspecified: Secondary | ICD-10-CM | POA: Diagnosis not present

## 2020-06-07 DIAGNOSIS — I824Z1 Acute embolism and thrombosis of unspecified deep veins of right distal lower extremity: Secondary | ICD-10-CM

## 2020-06-07 DIAGNOSIS — Z0189 Encounter for other specified special examinations: Secondary | ICD-10-CM

## 2020-06-07 DIAGNOSIS — M7989 Other specified soft tissue disorders: Secondary | ICD-10-CM

## 2020-06-07 DIAGNOSIS — I629 Nontraumatic intracranial hemorrhage, unspecified: Secondary | ICD-10-CM | POA: Diagnosis not present

## 2020-06-07 DIAGNOSIS — Z4659 Encounter for fitting and adjustment of other gastrointestinal appliance and device: Secondary | ICD-10-CM

## 2020-06-07 LAB — GLUCOSE, CAPILLARY
Glucose-Capillary: 199 mg/dL — ABNORMAL HIGH (ref 70–99)
Glucose-Capillary: 175 mg/dL — ABNORMAL HIGH (ref 70–99)
Glucose-Capillary: 196 mg/dL — ABNORMAL HIGH (ref 70–99)
Glucose-Capillary: 241 mg/dL — ABNORMAL HIGH (ref 70–99)
Glucose-Capillary: 193 mg/dL — ABNORMAL HIGH (ref 70–99)
Glucose-Capillary: 210 mg/dL — ABNORMAL HIGH (ref 70–99)

## 2020-06-07 LAB — CULTURE, BLOOD (ROUTINE X 2)
Special Requests: ADEQUATE
Special Requests: ADEQUATE

## 2020-06-07 LAB — CBC
HCT: 40.1 % (ref 39.0–52.0)
Hemoglobin: 12.8 g/dL — ABNORMAL LOW (ref 13.0–17.0)
MCH: 28.2 pg (ref 26.0–34.0)
MCHC: 31.9 g/dL (ref 30.0–36.0)
MCV: 88.3 fL (ref 80.0–100.0)
Platelets: 82 10*3/uL — ABNORMAL LOW (ref 150–400)
RBC: 4.54 MIL/uL (ref 4.22–5.81)
RDW: 16.3 % — ABNORMAL HIGH (ref 11.5–15.5)
WBC: 10.1 10*3/uL (ref 4.0–10.5)
nRBC: 0 % (ref 0.0–0.2)

## 2020-06-07 LAB — BASIC METABOLIC PANEL
Anion gap: 7 (ref 5–15)
BUN: 58 mg/dL — ABNORMAL HIGH (ref 8–23)
CO2: 22 mmol/L (ref 22–32)
Calcium: 8.2 mg/dL — ABNORMAL LOW (ref 8.9–10.3)
Chloride: 121 mmol/L — ABNORMAL HIGH (ref 98–111)
Creatinine, Ser: 1.89 mg/dL — ABNORMAL HIGH (ref 0.61–1.24)
GFR, Estimated: 37 mL/min — ABNORMAL LOW (ref >60)
Glucose, Bld: 210 mg/dL — ABNORMAL HIGH (ref 70–99)
Potassium: 3 mmol/L — ABNORMAL LOW (ref 3.5–5.1)
Sodium: 150 mmol/L — ABNORMAL HIGH (ref 135–145)

## 2020-06-07 MED ORDER — JUVEN PO PACK: 1.0000 | PACK | Freq: Two times a day (BID) | ORAL | Status: DC

## 2020-06-07 MED ORDER — MORPHINE SULFATE (PF) 4 MG/ML IV SOLN
4.0000 mg | INTRAVENOUS | Status: DC | PRN
  Administered 2020-06-07 – 2020-06-08 (×4): 4 mg via INTRAVENOUS
  Filled 2020-06-07 (×4): qty 1

## 2020-06-07 MED ORDER — METOPROLOL TARTRATE 5 MG/5ML IV SOLN
5.0000 mg | Freq: Four times a day (QID) | INTRAVENOUS | Status: DC | PRN
  Administered 2020-06-07 (×2): 5 mg via INTRAVENOUS
  Filled 2020-06-07 (×2): qty 5

## 2020-06-07 MED ORDER — DILTIAZEM HCL 30 MG PO TABS
60.0000 mg | ORAL_TABLET | Freq: Three times a day (TID) | ORAL | Status: DC
  Administered 2020-06-07 – 2020-06-08 (×4): 60 mg via ORAL
  Filled 2020-06-07 (×4): qty 2

## 2020-06-07 MED ORDER — FUROSEMIDE 10 MG/ML IJ SOLN: 40.0000 mg | Freq: Every day | INTRAMUSCULAR | Status: DC

## 2020-06-07 MED ORDER — GLUCERNA 1.5 CAL PO LIQD
1000.0000 mL | ORAL | Status: DC
  Filled 2020-06-07: qty 1000

## 2020-06-07 NOTE — Progress Notes (Signed)
Removed cortract. Pt tolerated  It well.

## 2020-06-07 NOTE — Progress Notes (Signed)
Subjective: Patient more confused again today when I saw him   Antibiotics:  Anti-infectives (From admission, onward)   Start     Dose/Rate Route Frequency Ordered Stop   06/06/20 1200  fluconazole (DIFLUCAN) tablet 800 mg        800 mg Oral Daily 06/06/20 1110     06/03/20 1300  anidulafungin (ERAXIS) 100 mg in sodium chloride 0.9 % 100 mL IVPB  Status:  Discontinued       "Followed by" Linked Group Details   100 mg 78 mL/hr over 100 Minutes Intravenous Every 24 hours 06/02/20 1119 06/06/20 1110   06/02/20 1600  fluconazole (DIFLUCAN) IVPB 400 mg  Status:  Discontinued        400 mg 100 mL/hr over 120 Minutes Intravenous Every 24 hours 06/02/20 1119 06/02/20 1712   06/02/20 1300  anidulafungin (ERAXIS) 200 mg in sodium chloride 0.9 % 200 mL IVPB       "Followed by" Linked Group Details   200 mg 78 mL/hr over 200 Minutes Intravenous  Once 06/02/20 1119 06/02/20 2142   06/02/20 1200  anidulafungin (ERAXIS) 100 mg in sodium chloride 0.9 % 100 mL IVPB  Status:  Discontinued        100 mg 78 mL/hr over 100 Minutes Intravenous Every 24 hours 06/01/20 1230 06/01/20 1249   06/02/20 1000  voriconazole (VFEND) tablet 200 mg  Status:  Discontinued        200 mg Oral Every 12 hours 06/01/20 1347 06/01/20 1436   06/02/20 1000  voriconazole (VFEND) tablet 200 mg  Status:  Discontinued        200 mg Per Tube Every 12 hours 06/01/20 1436 06/02/20 1119   06/01/20 1445  voriconazole (VFEND) tablet 400 mg        400 mg Per Tube Every 12 hours 06/01/20 1436 06/01/20 2145   06/01/20 1430  voriconazole (VFEND) tablet 400 mg  Status:  Discontinued        400 mg Oral Every 12 hours 06/01/20 1347 06/01/20 1436   06/01/20 1330  anidulafungin (ERAXIS) 200 mg in sodium chloride 0.9 % 200 mL IVPB  Status:  Discontinued        200 mg 78 mL/hr over 200 Minutes Intravenous  Once 06/01/20 1230 06/01/20 1249   06/01/20 1300  vancomycin (VANCOREADY) IVPB 1000 mg/200 mL  Status:  Discontinued         1,000 mg 200 mL/hr over 60 Minutes Intravenous Every 24 hours 05/31/20 1323 06/01/20 0928   05/31/20 1900  piperacillin-tazobactam (ZOSYN) IVPB 3.375 g  Status:  Discontinued        3.375 g 12.5 mL/hr over 240 Minutes Intravenous Every 8 hours 05/31/20 1012 06/02/20 1346   05/31/20 1100  vancomycin (VANCOREADY) IVPB 2000 mg/400 mL        2,000 mg 200 mL/hr over 120 Minutes Intravenous  Once 05/31/20 1012 05/31/20 1634   05/31/20 1100  piperacillin-tazobactam (ZOSYN) IVPB 3.375 g        3.375 g 100 mL/hr over 30 Minutes Intravenous  Once 05/31/20 1012 05/31/20 1406   05/21/20 1100  vancomycin (VANCOREADY) IVPB 1750 mg/350 mL  Status:  Discontinued        1,750 mg 175 mL/hr over 120 Minutes Intravenous Every 24 hours 05/20/20 0953 05/23/20 1046   05/20/20 1045  vancomycin (VANCOREADY) IVPB 2000 mg/400 mL        2,000 mg 200 mL/hr over 120 Minutes Intravenous  Once 05/20/20  7501 05/20/20 1341   05/19/20 1015  cefTRIAXone (ROCEPHIN) 2 g in sodium chloride 0.9 % 100 mL IVPB  Status:  Discontinued        2 g 200 mL/hr over 30 Minutes Intravenous Every 24 hours 05/18/20 0958 05/18/20 1132   05/18/20 1400  piperacillin-tazobactam (ZOSYN) IVPB 3.375 g  Status:  Discontinued        3.375 g 12.5 mL/hr over 240 Minutes Intravenous Every 8 hours 05/18/20 1132 05/23/20 1046   05/17/20 1015  cefTRIAXone (ROCEPHIN) 1 g in sodium chloride 0.9 % 100 mL IVPB  Status:  Discontinued        1 g 200 mL/hr over 30 Minutes Intravenous Every 24 hours 05/17/20 0918 05/18/20 0958   05/12/20 1530  ceFAZolin (ANCEF) IVPB 1 g/50 mL premix  Status:  Discontinued        1 g 100 mL/hr over 30 Minutes Intravenous Every 8 hours 05/12/20 1440 05/17/20 0918      Medications: Scheduled Meds: . chlorhexidine gluconate (MEDLINE KIT)  15 mL Mouth Rinse BID  . diltiazem  60 mg Oral Q8H  . feeding supplement (PROSource TF)  45 mL Per Tube TID  . fluconazole  800 mg Oral Daily  . free water  300 mL Per Tube Q4H  . insulin  aspart  0-20 Units Subcutaneous Q4H  . insulin glargine  15 Units Subcutaneous Daily  . levETIRAcetam  750 mg Per Tube BID  . mouth rinse  15 mL Mouth Rinse q12n4p  . metoprolol tartrate  50 mg Per Tube BID  . pantoprazole sodium  40 mg Per Tube Daily  . thiamine  100 mg Per Tube Daily   Continuous Infusions: . sodium chloride    . feeding supplement (JEVITY 1.5 CAL/FIBER) 1,000 mL (06/07/20 0946)   PRN Meds:.acetaminophen **OR** acetaminophen (TYLENOL) oral liquid 160 mg/5 mL **OR** acetaminophen, Gerhardt's butt cream, metoprolol tartrate, morphine injection, morphine injection, ondansetron (ZOFRAN) IV, polyethylene glycol, Resource ThickenUp Clear, senna-docusate    Objective: Weight change:   Intake/Output Summary (Last 24 hours) at 06/07/2020 1315 Last data filed at 06/07/2020 1200 Gross per 24 hour  Intake 100 ml  Output 3350 ml  Net -3250 ml   Blood pressure (!) 139/92, pulse 81, temperature 98.9 F (37.2 C), temperature source Oral, resp. rate (!) 40, height 5\' 10"  (1.778 m), weight 103.2 kg, SpO2 98 %. Temp:  [98.3 F (36.8 C)-99.1 F (37.3 C)] 98.9 F (37.2 C) (03/23 1225) Pulse Rate:  [66-139] 81 (03/23 1225) Resp:  [40-42] 40 (03/23 1225) BP: (124-140)/(70-99) 139/92 (03/23 1225) SpO2:  [97 %-99 %] 98 % (03/23 1225) Weight:  [103.2 kg] 103.2 kg (03/23 0500)  Physical Exam: Physical Exam HENT:     Head: Normocephalic.  Eyes:     Extraocular Movements: Extraocular movements intact.  Cardiovascular:     Rate and Rhythm: Tachycardia present.     Heart sounds: No murmur heard. No friction rub. No gallop.   Pulmonary:     Effort: No respiratory distress.     Breath sounds: No stridor. No wheezing.  Abdominal:     General: Bowel sounds are normal. There is no distension.  Skin:    General: Skin is warm and dry.  Neurological:     Comments: Starts to open his eyes to me calling his name but is somnolent otherwise      CBC:    BMET Recent Labs     06/06/20 0435 06/07/20 0419  NA 149* 150*  K  3.0* 3.0*  CL 119* 121*  CO2 21* 22  GLUCOSE 199* 210*  BUN 53* 58*  CREATININE 1.79* 1.89*  CALCIUM 8.0* 8.2*     Liver Panel  Recent Labs    06/05/20 0433  PROT 5.8*  ALBUMIN 1.7*  AST 69*  ALT 127*  ALKPHOS 385*  BILITOT 0.7       Sedimentation Rate No results for input(s): ESRSEDRATE in the last 72 hours. C-Reactive Protein No results for input(s): CRP in the last 72 hours.  Micro Results: Recent Results (from the past 720 hour(s))  Blood Cultures (routine x 2)     Status: None   Collection Time: 05/11/20  7:36 PM   Specimen: Site Not Specified; Blood  Result Value Ref Range Status   Specimen Description   Final    SITE NOT SPECIFIED Performed at [facility] [address] [place], [place] [address]    Special Requests   Final    BOTTLES DRAWN AEROBIC AND ANAEROBIC Blood Culture results may not be optimal due to an inadequate volume of blood received in culture bottles Performed at [facility] [address] [place], [place] [address]    Culture   Final    NO GROWTH 5 DAYS Performed at [facility], [place] [address] [place], [place] [address]    Report Status 05/16/2020 FINAL  Final  Blood Cultures (routine x 2)     Status: None   Collection Time: 05/11/20  7:36 PM   Specimen: BLOOD  Result Value Ref Range Status   Specimen Description   Final    BLOOD LEFT WRIST Performed at [facility] [address] [place], [place] [address]    Special Requests   Final    BOTTLES DRAWN AEROBIC AND ANAEROBIC Blood Culture adequate volume Performed at [facility] [address] [place], [place] [address]    Culture   Final    NO GROWTH 5 DAYS Performed at [facility], [place] [address], [place], [place] [address]    Report Status 05/16/2020 FINAL  Final  Urine culture     Status: None   Collection Time: 05/11/20   9:00 PM   Specimen: Urine, Random  Result Value Ref Range Status   Specimen Description   Final    URINE, RANDOM Performed at [facility] [address], [place], [place] [address]    Special Requests   Final    NONE Performed at [facility], [place] [address], [place], [place] [address]    Culture   Final    NO GROWTH Performed at [facility], [place] [address] [place], [place] [address]    Report Status 05/13/2020 FINAL  Final  Resp Panel by RT-PCR (Flu A&B, Covid) Nasopharyngeal Swab     Status: None   Collection Time: 05/11/20  9:30 PM   Specimen: Nasopharyngeal Swab; Nasopharyngeal(NP) swabs in vial transport medium  Result Value Ref Range Status   SARS Coronavirus 2 by RT PCR NEGATIVE NEGATIVE Final    Comment: (NOTE) SARS-CoV-2 target nucleic acids are NOT DETECTED.  The SARS-CoV-2 RNA is generally detectable in upper respiratory specimens during the acute phase of infection. The lowest concentration of SARS-CoV-2 viral copies this assay can detect is 138 copies/mL. A negative result does not preclude SARS-Cov-2 infection and should not be used as the sole basis for treatment or other patient management decisions. A negative result  may occur with  improper specimen collection/handling, submission of specimen other than nasopharyngeal swab, presence of viral mutation(s) within the areas targeted by this assay, and inadequate number of viral copies(<138 copies/mL). A negative result must be combined with clinical observations, patient history, and epidemiological information. The expected result is Negative.  Fact Sheet for Patients:  EntrepreneurPulse.com.au  Fact Sheet for Healthcare Providers:  IncredibleEmployment.be  This test is no t yet approved or cleared by the [place] FDA and  has been authorized for detection and/or diagnosis of SARS-CoV-2 by FDA under an Emergency Use  Authorization (EUA). This EUA will remain  in effect (meaning this test can be used) for the duration of the COVID-19 declaration under Section 564(b)(1) of the Act, 21 U.S.C.section 360bbb-3(b)(1), unless the authorization is terminated  or revoked sooner.       Influenza A by PCR NEGATIVE NEGATIVE Final   Influenza B by PCR NEGATIVE NEGATIVE Final    Comment: (NOTE) The Xpert Xpress SARS-CoV-2/FLU/RSV plus assay is intended as an aid in the diagnosis of influenza from Nasopharyngeal swab specimens and should not be used as a sole basis for treatment. Nasal washings and aspirates are unacceptable for Xpert Xpress SARS-CoV-2/FLU/RSV testing.  Fact Sheet for Patients: EntrepreneurPulse.com.au  Fact Sheet for Healthcare Providers: IncredibleEmployment.be  This test is not yet approved or cleared by the [place] FDA and has been authorized for detection and/or diagnosis of SARS-CoV-2 by FDA under an Emergency Use Authorization (EUA). This EUA will remain in effect (meaning this test can be used) for the duration of the COVID-19 declaration under Section 564(b)(1) of the Act, 21 U.S.C. section 360bbb-3(b)(1), unless the authorization is terminated or revoked.  Performed at [facility], [place] [address], [place], [place] [address]   MRSA PCR Screening     Status: None   Collection Time: 05/12/20 12:26 AM   Specimen: Nasopharyngeal Swab  Result Value Ref Range Status   MRSA by PCR NEGATIVE NEGATIVE Final    Comment:        The GeneXpert MRSA Assay (FDA approved for NASAL specimens only), is one component of a comprehensive MRSA colonization surveillance program. It is not intended to diagnose MRSA infection nor to guide or monitor treatment for MRSA infections. Performed at [facility], [place] [address], [place], [place] [address]   Expectorated Sputum Assessment w Gram Stain, Rflx to Resp Cult     Status:  None   Collection Time: 05/17/20 11:29 PM   Specimen: Sputum  Result Value Ref Range Status   Specimen Description SPUTUM  Final   Special Requests NONE  Final   Sputum evaluation   Final    THIS SPECIMEN IS ACCEPTABLE FOR SPUTUM CULTURE Performed at [facility], [place] [address], [place], [place] [address]    Report Status 05/18/2020 FINAL  Final  Culture, Respiratory w Gram Stain     Status: None   Collection Time: 05/17/20 11:29 PM   Specimen: SPU  Result Value Ref Range Status   Specimen Description SPUTUM  Final   Special Requests NONE Reflexed from [id number]  Final   Gram Stain   Final    FEW WBC PRESENT, PREDOMINANTLY PMN FEW GRAM NEGATIVE RODS FEW GRAM POSITIVE COCCI IN PAIRS IN CLUSTERS RARE GRAM POSITIVE RODS    Culture   Final    FEW Normal respiratory flora-no Staph aureus or Pseudomonas seen Performed at [facility], [address], [place], [place] [address]  Report Status 05/20/2020 FINAL  Final  Culture, blood (Routine X 2) w Reflex to ID Panel     Status: None   Collection Time: 05/19/20 12:44 AM   Specimen: BLOOD  Result Value Ref Range Status   Specimen Description BLOOD RIGHT ARM  Final   Special Requests   Final    BOTTLES DRAWN AEROBIC AND ANAEROBIC Blood Culture adequate volume   Culture   Final    NO GROWTH 5 DAYS Performed at [facility], [address] [place], [place] [address]    Report Status 05/24/2020 FINAL  Final  Culture, blood (Routine X 2) w Reflex to ID Panel     Status: None   Collection Time: 05/19/20 12:59 AM   Specimen: BLOOD RIGHT HAND  Result Value Ref Range Status   Specimen Description BLOOD RIGHT HAND  Final   Special Requests   Final    BOTTLES DRAWN AEROBIC AND ANAEROBIC Blood Culture results may not be optimal due to an inadequate volume of blood received in culture bottles   Culture   Final    NO GROWTH 5 DAYS Performed at [facility], [address], [place], [place] [address]    Report  Status 05/24/2020 FINAL  Final  Expectorated Sputum Assessment w Gram Stain, Rflx to Resp Cult     Status: None   Collection Time: 05/19/20  6:30 AM   Specimen: Expectorated Sputum  Result Value Ref Range Status   Specimen Description EXPECTORATED SPUTUM  Final   Special Requests Normal  Final   Sputum evaluation   Final    THIS SPECIMEN IS ACCEPTABLE FOR SPUTUM CULTURE Performed at [facility], [address] [place], [place] [address]    Report Status 05/22/2020 FINAL  Final  Culture, Respiratory w Gram Stain     Status: None   Collection Time: 05/19/20  6:30 AM  Result Value Ref Range Status   Specimen Description EXPECTORATED SPUTUM  Final   Special Requests Normal Reflexed from [id number]  Final   Gram Stain   Final    ABUNDANT WBC PRESENT,BOTH PMN AND MONONUCLEAR FEW SQUAMOUS EPITHELIAL CELLS PRESENT FEW GRAM POSITIVE COCCI RARE GRAM VARIABLE ROD    Culture   Final    RARE Normal respiratory flora-no Staph aureus or Pseudomonas seen Performed at [facility], [address], [place], [place] [address]    Report Status 05/21/2020 FINAL  Final  Culture, blood (routine x 2)     Status: Abnormal (Preliminary result)   Collection Time: 05/31/20  9:28 AM   Specimen: BLOOD  Result Value Ref Range Status   Specimen Description BLOOD LEFT ANTECUBITAL  Final   Special Requests   Final    BOTTLES DRAWN AEROBIC AND ANAEROBIC Blood Culture results may not be optimal due to an inadequate volume of blood received in culture bottles   Culture  Setup Time (A)  Final    YEAST IN BOTH AEROBIC AND ANAEROBIC BOTTLES CRITICAL RESULT CALLED TO, READ BACK BY AND VERIFIED WITH: PHARMD THOMAS J. 1326 [id number] FCP    Culture (A)  Final    CANDIDA GLABRATA Sent to [facility] for further susceptibility testing. Performed at [facility], [address], [place], [place] [address]    Report Status PENDING  Incomplete  Blood Culture ID Panel (Reflexed)     Status: Abnormal    Collection Time: 05/31/20  9:28 AM  Result Value Ref Range Status   Enterococcus  faecalis NOT DETECTED NOT DETECTED Final   Enterococcus Faecium NOT DETECTED NOT DETECTED Final   Listeria monocytogenes NOT DETECTED NOT DETECTED Final   Staphylococcus species NOT DETECTED NOT DETECTED Final   Staphylococcus aureus (BCID) NOT DETECTED NOT DETECTED Final   Staphylococcus epidermidis NOT DETECTED NOT DETECTED Final   Staphylococcus lugdunensis NOT DETECTED NOT DETECTED Final   Streptococcus species NOT DETECTED NOT DETECTED Final   Streptococcus agalactiae NOT DETECTED NOT DETECTED Final   Streptococcus pneumoniae NOT DETECTED NOT DETECTED Final   Streptococcus pyogenes NOT DETECTED NOT DETECTED Final   A.calcoaceticus-baumannii NOT DETECTED NOT DETECTED Final   Bacteroides fragilis NOT DETECTED NOT DETECTED Final   Enterobacterales NOT DETECTED NOT DETECTED Final   Enterobacter cloacae complex NOT DETECTED NOT DETECTED Final   Escherichia coli NOT DETECTED NOT DETECTED Final   Klebsiella aerogenes NOT DETECTED NOT DETECTED Final   Klebsiella oxytoca NOT DETECTED NOT DETECTED Final   Klebsiella pneumoniae NOT DETECTED NOT DETECTED Final   Proteus species NOT DETECTED NOT DETECTED Final   Salmonella species NOT DETECTED NOT DETECTED Final   Serratia marcescens NOT DETECTED NOT DETECTED Final   Haemophilus influenzae NOT DETECTED NOT DETECTED Final   Neisseria meningitidis NOT DETECTED NOT DETECTED Final   Pseudomonas aeruginosa NOT DETECTED NOT DETECTED Final   Stenotrophomonas maltophilia NOT DETECTED NOT DETECTED Final   Candida albicans NOT DETECTED NOT DETECTED Final   Candida auris NOT DETECTED NOT DETECTED Final   Candida glabrata DETECTED (A) NOT DETECTED Final    Comment: CRITICAL RESULT CALLED TO, READ BACK BY AND VERIFIED WITH: PHARMD THOMAS J. 1326 [id number] FCP    Candida krusei NOT DETECTED NOT DETECTED Final   Candida parapsilosis NOT DETECTED NOT DETECTED Final   Candida  tropicalis NOT DETECTED NOT DETECTED Final   Cryptococcus neoformans/gattii NOT DETECTED NOT DETECTED Final    Comment: Performed at [facility], [address] [place], [place] [address]  Antifungal AST 9 Drug Panel     Status: None   Collection Time: 05/31/20  9:28 AM  Result Value Ref Range Status   Organism ID, Yeast Candida glabrata  Corrected    Comment: (NOTE) Identification performed by account, not confirmed by this laboratory. CORRECTED ON 03/22 AT 1036: PREVIOUSLY REPORTED AS Preliminary report    Amphotericin B MIC 1.0 ug/mL  Final    Comment: (NOTE) *Breakpoints have been established for only some organism-drug combinations as indicated. Results of this test are labeled for research purposes only by the assay's manufacturer. The performance characteristics of this assay have not been established by the manufacturer. The result should not be used for treatment or for diagnostic purposes without confirmation of the diagnosis by another medically established diagnostic product or procedure. The performance characteristics were determined by [facility].    Please Note: PENDING  Incomplete   Anidulafungin MIC Comment  Final    Comment: (NOTE) 0.03 ug/mL Susceptible *Breakpoints have been established for only some organism-drug combinations as indicated. Results of this test are labeled for research purposes only by the assay's manufacturer. The performance characteristics of this assay have not been established by the manufacturer. The result should not be used for treatment or for diagnostic purposes without confirmation of the diagnosis by another medically established diagnostic product or procedure. The performance characteristics were determined by [facility].    Caspofungin MIC Comment  Final    Comment: 0.06 ug/mL Susceptible   Micafungin MIC Comment  Final    Comment: 0.016 ug/mL Susceptible  Posaconazole MIC 1.0 ug/mL  Final    Comment:  (NOTE) *Breakpoints have been established for only some organism-drug combinations as indicated. Results of this test are labeled for research purposes only by the assay's manufacturer. The performance characteristics of this assay have not been established by the manufacturer. The result should not be used for treatment or for diagnostic purposes without confirmation of the diagnosis by another medically established diagnostic product or procedure. The performance characteristics were determined by [facility].    Please Note: PENDING  Incomplete   Fluconazole Islt MIC 16.0 ug/mL  Final    Comment: Susceptible Dose Dependent   Flucytosine MIC 0.06 ug/mL or less  Final   Itraconazole MIC 1.0 ug/mL  Final   Voriconazole MIC 0.5 ug/mL  Final    Comment: (NOTE) Performed At: [facility] [place], [place] [id number] Rush Farmer MD DG:[id number]    Source PENDING  Incomplete  Culture, blood (routine x 2)     Status: Abnormal (Preliminary result)   Collection Time: 05/31/20  9:36 AM   Specimen: BLOOD LEFT HAND  Result Value Ref Range Status   Specimen Description BLOOD LEFT HAND  Final   Special Requests   Final    BOTTLES DRAWN AEROBIC AND ANAEROBIC Blood Culture results may not be optimal due to an inadequate volume of blood received in culture bottles   Culture  Setup Time   Final    YEAST IN BOTH AEROBIC AND ANAEROBIC BOTTLES CRITICAL VALUE NOTED.  VALUE IS CONSISTENT WITH PREVIOUSLY REPORTED AND CALLED VALUE. Performed at [facility], [place] [address], [place], [place] [address]    Culture CANDIDA GLABRATA (A)  Final   Report Status PENDING  Incomplete  MRSA PCR Screening     Status: None   Collection Time: 05/31/20  3:52 PM   Specimen: Nasal Mucosa; Nasopharyngeal  Result Value Ref Range Status   MRSA by PCR NEGATIVE NEGATIVE Final    Comment:        The GeneXpert MRSA Assay (FDA approved for NASAL specimens only), is one component of  a comprehensive MRSA colonization surveillance program. It is not intended to diagnose MRSA infection nor to guide or monitor treatment for MRSA infections. Performed at [facility], [place] [address], [place], [place] [address]   Culture, blood (Routine X 2) w Reflex to ID Panel     Status: Abnormal   Collection Time: 06/02/20 10:08 AM   Specimen: BLOOD  Result Value Ref Range Status   Specimen Description BLOOD LEFT ANTECUBITAL  Final   Special Requests   Final    BOTTLES DRAWN AEROBIC ONLY Blood Culture adequate volume   Culture  Setup Time   Final    YEAST AEROBIC BOTTLE ONLY CRITICAL VALUE NOTED.  VALUE IS CONSISTENT WITH PREVIOUSLY REPORTED AND CALLED VALUE. Performed at [facility], [place] [address], [place], [place] [address]    Culture CANDIDA GLABRATA (A)  Final   Report Status 06/07/2020 FINAL  Final  Culture, blood (Routine X 2) w Reflex to ID Panel     Status: Abnormal   Collection Time: 06/02/20 10:08 AM   Specimen: BLOOD LEFT HAND  Result Value Ref Range Status   Specimen Description BLOOD LEFT HAND  Final   Special Requests   Final    BOTTLES DRAWN AEROBIC ONLY Blood Culture adequate volume   Culture  Setup Time   Final    YEAST WITH PSEUDOHYPHAE AEROBIC BOTTLE ONLY CRITICAL RESULT  CALLED TO, READ BACK BY AND VERIFIED WITH: J. Eden Emms, AT 1410 06/05/20 Renato Shin Performed at [facility], [address] [place], [place] [address]    Culture CANDIDA GLABRATA (A)  Final   Report Status 06/07/2020 FINAL  Final  Culture, blood (routine x 2)     Status: Abnormal   Collection Time: 06/04/20  3:19 PM   Specimen: BLOOD  Result Value Ref Range Status   Specimen Description BLOOD LEFT ANTECUBITAL  Final   Special Requests   Final    BOTTLES DRAWN AEROBIC AND ANAEROBIC Blood Culture results may not be optimal due to an inadequate volume of blood received in culture bottles   Culture  Setup Time   Final    GRAM POSITIVE COCCI IN  CLUSTERS ANAEROBIC BOTTLE ONLY CRITICAL RESULT CALLED TO, READ BACK BY AND VERIFIED WITH: PHARMD JESSICA C 2000 [id number] FCP    Culture (A)  Final    STAPHYLOCOCCUS EPIDERMIDIS THE SIGNIFICANCE OF ISOLATING THIS ORGANISM FROM A SINGLE SET OF BLOOD CULTURES WHEN MULTIPLE SETS ARE DRAWN IS UNCERTAIN. PLEASE NOTIFY THE MICROBIOLOGY DEPARTMENT WITHIN ONE WEEK IF SPECIATION AND SENSITIVITIES ARE REQUIRED. Performed at [facility], [address] [place], [place] [address]    Report Status 06/07/2020 FINAL  Final  Blood Culture ID Panel (Reflexed)     Status: Abnormal   Collection Time: 06/04/20  3:19 PM  Result Value Ref Range Status   Enterococcus faecalis NOT DETECTED NOT DETECTED Final   Enterococcus Faecium NOT DETECTED NOT DETECTED Final   Listeria monocytogenes NOT DETECTED NOT DETECTED Final   Staphylococcus species DETECTED (A) NOT DETECTED Final    Comment: CRITICAL RESULT CALLED TO, READ BACK BY AND VERIFIED WITH: PHARMD JESSICA C 2000 [id number] FCP    Staphylococcus aureus (BCID) NOT DETECTED NOT DETECTED Final   Staphylococcus epidermidis DETECTED (A) NOT DETECTED Final    Comment: Methicillin (oxacillin) resistant coagulase negative staphylococcus. Possible blood culture contaminant (unless isolated from more than one blood culture draw or clinical case suggests pathogenicity). No antibiotic treatment is indicated for blood  culture contaminants. CRITICAL RESULT CALLED TO, READ BACK BY AND VERIFIED WITH: PHARMD JESSICA C 2000 [id number] FCP    Staphylococcus lugdunensis NOT DETECTED NOT DETECTED Final   Streptococcus species NOT DETECTED NOT DETECTED Final   Streptococcus agalactiae NOT DETECTED NOT DETECTED Final   Streptococcus pneumoniae NOT DETECTED NOT DETECTED Final   Streptococcus pyogenes NOT DETECTED NOT DETECTED Final   A.calcoaceticus-baumannii NOT DETECTED NOT DETECTED Final   Bacteroides fragilis NOT DETECTED NOT DETECTED Final   Enterobacterales NOT DETECTED NOT  DETECTED Final   Enterobacter cloacae complex NOT DETECTED NOT DETECTED Final   Escherichia coli NOT DETECTED NOT DETECTED Final   Klebsiella aerogenes NOT DETECTED NOT DETECTED Final   Klebsiella oxytoca NOT DETECTED NOT DETECTED Final   Klebsiella pneumoniae NOT DETECTED NOT DETECTED Final   Proteus species NOT DETECTED NOT DETECTED Final   Salmonella species NOT DETECTED NOT DETECTED Final   Serratia marcescens NOT DETECTED NOT DETECTED Final   Haemophilus influenzae NOT DETECTED NOT DETECTED Final   Neisseria meningitidis NOT DETECTED NOT DETECTED Final   Pseudomonas aeruginosa NOT DETECTED NOT DETECTED Final   Stenotrophomonas maltophilia NOT DETECTED NOT DETECTED Final   Candida albicans NOT DETECTED NOT DETECTED Final   Candida auris NOT DETECTED NOT DETECTED Final   Candida glabrata NOT DETECTED NOT DETECTED Final   Candida krusei NOT DETECTED NOT DETECTED Final   Candida parapsilosis  NOT DETECTED NOT DETECTED Final   Candida tropicalis NOT DETECTED NOT DETECTED Final   Cryptococcus neoformans/gattii NOT DETECTED NOT DETECTED Final   Methicillin resistance mecA/C DETECTED (A) NOT DETECTED Final    Comment: CRITICAL RESULT CALLED TO, READ BACK BY AND VERIFIED WITH: PHARMD JESSICA C 2000 [id number] FCP Performed at [facility], [place] [address], [place], [place] [address]   Culture, blood (routine x 2)     Status: None (Preliminary result)   Collection Time: 06/04/20  3:20 PM   Specimen: BLOOD RIGHT HAND  Result Value Ref Range Status   Specimen Description BLOOD RIGHT HAND  Final   Special Requests   Final    BOTTLES DRAWN AEROBIC ONLY Blood Culture results may not be optimal due to an inadequate volume of blood received in culture bottles   Culture   Final    NO GROWTH 3 DAYS Performed at [facility], [place] [address], [place], [place] [address]    Report Status PENDING  Incomplete    Studies/Results: DG CHEST PORT 1 VIEW  Result Date: 06/06/2020 CLINICAL DATA:   70 year old male with respiratory distress. EXAM: PORTABLE CHEST 1 VIEW COMPARISON:  Chest radiograph dated 06/04/2020. FINDINGS: Feeding tube extends below the diaphragm. No interval change in the patchy left lung base density compared to prior radiograph. No large pleural effusion. No pneumothorax. Stable cardiomediastinal silhouette. Atherosclerotic calcification of the aorta. Median sternotomy wires and CABG vascular clips. No acute osseous pathology. Old healed right posterior rib fractures. IMPRESSION: No interval change. Electronically Signed   By: Anner Crete M.D.   On: 06/06/2020 02:45   VAS [place] LOWER EXTREMITY VENOUS (DVT)  Result Date: 06/07/2020  Lower Venous DVT Study Indications: Swelling LT>RT.  Anticoagulation: Recently discontinued eliquis due to active bleed. Comparison Study: No prior studies. Performing Technologist: Darlin Coco RDMS,RVT  Examination Guidelines: A complete evaluation includes B-mode imaging, spectral Doppler, color Doppler, and power Doppler as needed of all accessible portions of each vessel. Bilateral testing is considered an integral part of a complete examination. Limited examinations for reoccurring indications may be performed as noted. The reflux portion of the exam is performed with the patient in reverse Trendelenburg.  +---------+---------------+---------+-----------+----------+-------------------+ RIGHT    CompressibilityPhasicitySpontaneityPropertiesThrombus Aging      +---------+---------------+---------+-----------+----------+-------------------+ CFV      Partial        Yes      Yes                  Acute               +---------+---------------+---------+-----------+----------+-------------------+ SFJ      Partial        Yes      Yes                  Acute               +---------+---------------+---------+-----------+----------+-------------------+ FV Prox  Partial        Yes      Yes                  Acute                +---------+---------------+---------+-----------+----------+-------------------+ FV Mid   Full           Yes      Yes                                      +---------+---------------+---------+-----------+----------+-------------------+  FV DistalFull                                                             +---------+---------------+---------+-----------+----------+-------------------+ PFV      Partial        Yes      Yes                  Acute               +---------+---------------+---------+-----------+----------+-------------------+ POP      Full           Yes      Yes                                      +---------+---------------+---------+-----------+----------+-------------------+ PTV      Full                                                             +---------+---------------+---------+-----------+----------+-------------------+ PERO                                                  Not well visualized +---------+---------------+---------+-----------+----------+-------------------+ GSV      Partial        Yes      Yes                  Acute               +---------+---------------+---------+-----------+----------+-------------------+ EIV                     Yes      Yes                                      +---------+---------------+---------+-----------+----------+-------------------+   +---------+---------------+---------+-----------+----------+-------------------+ LEFT     CompressibilityPhasicitySpontaneityPropertiesThrombus Aging      +---------+---------------+---------+-----------+----------+-------------------+ CFV      None           No       No                   Acute               +---------+---------------+---------+-----------+----------+-------------------+ SFJ      None                                         Acute               +---------+---------------+---------+-----------+----------+-------------------+ FV  Prox  None           No       No  Acute               +---------+---------------+---------+-----------+----------+-------------------+ FV Mid                  No       No                   Acute               +---------+---------------+---------+-----------+----------+-------------------+ FV Distal               No       No                   Acute               +---------+---------------+---------+-----------+----------+-------------------+ PFV      None           No       No                   Acute               +---------+---------------+---------+-----------+----------+-------------------+ POP                     No       No                   Acute               +---------+---------------+---------+-----------+----------+-------------------+ PTV      None           No       No                   Acute               +---------+---------------+---------+-----------+----------+-------------------+ PERO                                                  Not well visualized +---------+---------------+---------+-----------+----------+-------------------+ Gastroc  None           No       No                                       +---------+---------------+---------+-----------+----------+-------------------+ EIV      None           No       No                                       +---------+---------------+---------+-----------+----------+-------------------+    Summary: RIGHT: - Findings consistent with acute deep vein thrombosis involving the right common femoral vein, SF junction, right femoral vein, and right proximal profunda vein. - No cystic structure found in the popliteal fossa. - Common femoral vein obstruction doesn't appear to extend above inguinal ligament.  LEFT: - Findings consistent with acute deep vein thrombosis involving the left common femoral vein, SF junction, left femoral vein, left proximal profunda vein, left popliteal  vein, left posterior tibial veins, left gastrocnemius veins, and EIV. - No cystic structure found in the popliteal fossa. - Possible obstruction proximal to the inguinal ligament.  *  See table(s) above for measurements and observations.    Preliminary       Assessment/Plan:  INTERVAL HISTORY: He is now but has been found to have a deep venous thrombosis acutely   Active Problems:   Pressure injury of skin   IVH (intraventricular hemorrhage) (HCC)   Altered mental status   Goals of care, counseling/discussion   Hypertensive emergency   Atrial flutter ([place])   Fungemia   Metabolic encephalopathy   Severe sepsis without septic shock (HCC)   Acute respiratory failure (HCC)   Aspiration into airway   Intracranial hemorrhage ([place])    Jason [name] is a 70 y.o. male with Struve coronary disease diabetes mellitus paroxysmal atrial fibrillation stroke systolic heart failure who was admitted to the ICU with thalamic hemorrhage with intraventricular extension that required reversal of his anticoagulation intubation and placement of an external ventricular drain.  He was then reintubated but fortunately successfully extubated.  Drain has been out.  He does not appear to have had any central lines.  He became febrile to 102.8 and blood cultures were taken and he was started on vancomycin and Zosyn.  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 I have not seen Candidal meningitis but he did have drain placed (it does not appear overtly infected and MRI brain unremarkable for infection  Certainly appreciate Dr. Valetta Close excluding fungal endophthalmitis  His Candida glabrata has dose-dependent sensitivity to fluconazole we have placed him on fluconazole high-dose  #2 acute DVT this found: Defer to primary team  neurosurgery.  Certainly his recent intraventricular bleed would complicate anticoagulation.  3.  Goals of care had discussions with his son Dominica Severin 2 days ago but no one was at the bedside today.  Would encourage further discussions as his condition continues to become more complicated.      LOS: 27 days   Alcide Evener 06/07/2020, 1:15 PM

## 2020-06-07 NOTE — Progress Notes (Signed)
OT Cancellation Note  Patient Details Name: Corbitt Cloke MRN: [id number] DOB: 05/11/1950   Cancelled Treatment:    Reason Eval/Treat Not Completed: Medical issues which prohibited therapy, awaiting BLE doppler to rule out DVT.  Will follow and see as able.   Barry Brunner, OT Acute Rehabilitation Services Pager [phone] Office [phone]   Chancy Milroy 06/07/2020, 8:15 AM

## 2020-06-07 NOTE — Progress Notes (Signed)
SLP Cancellation Note  Patient Details Name: Boss Danielsen MRN: [id number] DOB: [birth year]   Cancelled treatment:       Reason Eval/Treat Not Completed: Fatigue/lethargy limiting ability to participate. Pt with high RR and HR. Will defer tx today. Am meal being held.    Dorinda Stehr, Riley Nearing 06/07/2020, 9:48 AM

## 2020-06-07 NOTE — Progress Notes (Signed)
Nutrition Follow-up  DOCUMENTATION CODES:  Not applicable  INTERVENTION:  Continue TF via Cortrak: -Transition to Glucerna 1.5 @ 62ml/hr, advance 20ml/hr Q4H until goal rate of 72ml/hr ( ) is reached -Free water flush per MD, currently Q4H -1 packet Juven BID, each packet provides 95 calories, 2.5 grams of protein (collagen), and 9.8 grams of carbohydrate (3 grams sugar); also contains 7 grams of L-arginine and L-glutamine, 300 mg vitamin C, 15 mg vitamin E, 1.2 mcg vitamin B-12, 9.5 mg zinc, 200 mg calcium, and 1.5 g  Calcium Beta-hydroxy-Beta-methylbutyrate to support wound healing  At goal, TF will provide 2160 kcals (2350 kcals w/ Juven) , 119g protein (124g w/ Juven), free water ( total free water with flushes)  -d/c Prosource TF  NUTRITION DIAGNOSIS:  Inadequate oral intake related to inability to eat as evidenced by NPO status. -- Progressing, pt now on dysphagia 1 diet with honey thick liquids, although intake remains inadequate  GOAL:  Patient will meet greater than or equal to 90% of their needs -- Being addressed via TF  MONITOR:  TF tolerance  REASON FOR ASSESSMENT:  Consult,Ventilator Enteral/tube feeding initiation and management  ASSESSMENT:  Pt with PMH of anxiety, depression, DM, HTN, stroke, and MI admitted with ICH likely due to HTN.  2/25 s/p EVD and intubation 3/01 extubated; vomited post extubation concern for aspiration PNA 3/02 failed CVD clamping; cortrak placed with tip gastric per xray 3/03 Cortrak placed 3/05 EVD removed, Re-intubated 3/8 extubated 3/18 diet advanced to dysphagia 1 diet with honey thick liquids  Pt lethargic and difficult to arouse. Note pt has been very eager to eat/drink over the last few days, though intake has not been sufficient to meet kcal/protein needs -- 0-75% meal completion x last 8 recorded meals (31% average meal intake). Additionally, per SLP, it is possible pt's respiratory function has been  impacted by his eating/drinking. SLP notes that there needs to be ongoing discussions with family on the risks of pt eating/drinking with his waxing and waning mental status. Per RN, breakfast was held this morning due to pt having fatigue/lethargy and high RR and HR.   Note MD reduced TF rate on 3/19 after pt's diet advancement. Pt has since been receiving the following via Cortrak: Jevity 1.5 @ 9ml/hr, 34ml Prosource TF TID, free water Q4H (per MD). Given poor PO intake, potential risk of eating/drinking per SLP, elevated CBGs, and increased nutrient needs for healing, RD will adjust TF regimen to better meet pt's needs. Per MD, no plans for PEG at this time.  PMT following with recommendation to move towards comfort care.   Admission weight: 100 kg Current weight: 103.2 kg  UOP: x24 hours I/O: +3309.45ml since admit  Medications: SSI Q4H, 15 units lantus daily, protonix, thiamine Labs: Na 150 (H), K+ 3.0 (L), Cr 1.89 (H, up from yesterday) CBGs [phone]  Diet Order:   Diet Order            DIET - DYS 1 Room service appropriate? No; Fluid consistency: Honey Thick  Diet effective now                EDUCATION NEEDS:  No education needs have been identified at this time  Skin:  Skin Assessment: Skin Integrity Issues: Skin Integrity Issues:: DTI DTI: buttocks  Last BM:  3/23 type 7  Height:  Ht Readings from Last 1 Encounters:  05/12/20 5\' 10"  (1.778 m)  Weight:  Wt Readings from Last 1 Encounters:  06/07/20  103.2 kg  BMI:  Body mass index is 32.65 kg/m.  Estimated Nutritional Needs:  Kcal:  2100-2300 Protein:  115-130 grams Fluid:  >2 L/day   Eugene Gavia, MS, RD, LDN RD pager number and weekend/on-call pager number located in Amion.

## 2020-06-07 NOTE — Progress Notes (Signed)
PT Cancellation Note  Patient Details Name: Jason [name] MRN: [id number] DOB: Mar 20, 1950   Cancelled Treatment:    Reason Eval/Treat Not Completed: Medical issues which prohibited therapy Pt found to have bil acute DVT.  Will hold for anti-coagulation per PT guidelines.  Will f/u at later date. Anise Salvo, PT Acute Rehab Services Pager [phone] [facility] [phone]    Rayetta Humphrey 06/07/2020, 1:35 PM

## 2020-06-07 NOTE — Progress Notes (Signed)
Daily Progress Note   Patient Name: Jason [name]       Date: 06/07/2020 DOB: May 14, 1950  Age: 70 y.o. MRN#: [id number] Attending Physician: Marinda Elk, MD Primary Care Physician: Sherron Monday, MD Admit Date: 05/11/2020  Reason for Consultation/Follow-up: To discuss complex medical decision making related to patient's goals of care  Patient s/p IVH.  EVD drain since displaced.  Successfully extubated.  Persistent encephalopathy.  Now able to enjoy some comfort feeds. Has fungemia.  Now with extensive bilateral DVTs.  Discussed with Dr. David Stall.  I am greatly concerned that Mr. Churchwell suffering will greatly increase unless we change our goal of care to comfort.  There are no options for treatment of the extensive DVTs - no blood thinners, IVC filter, or surgery is available to [place] due to his very complicated medical status.  Subjective: First I spoke with the patient's son Jason [name].  I explained that the patient developed scrotal swelling and was found to have extensive blood clots in both legs.  I explained that we can not give blood thinners due to IVH and his father is not a surgical candidate.  Jason [name] felt that his father has a strong will to live as evidenced by his eating and that there had to be something we could do about the blood clots.     I explained to Jason [name] that there is something we can do about the blood clots.  We can keep his father out of pain.  We will continue to carefully hand feed him as he appears to enjoy it.  Jason [name] was relieved that we could keep his father comfortable.    I then attempted to call patient's son Jason [name] but was unable to reach him.  I sent him a text message asking him to call me.  I then called the patient's mother Jason [name] - she is 88 years old.  I  explained to her that I am very worried that her son may not survive the hospitalization.  She agreed that she was worried about that as well.  I told her about the bilateral lower extremity blood clots and that there are no options to directly treat the blood clots - rather our focus is on keeping [place] as comfortable as possible.  Mrs. Laurel expressed understanding.   I asked her  to please help me get in touch with Jason [name].  She took my name and said she would ask him to call me.   Assessment: Patient with prolonged hospitalization and a complex course.  Unfortunately blood thinners would cause him harm as would surgery.  He is not a candidate for IVC filter due to fungemia.  His prognosis is very short and he would be best served by a comfort measures approach to reduce suffering.   Patient Profile/HPI:   70 y.o. male  with past medical history of DM, previous CVA, CAD/MI/CABG/Afib on anticoagulation, anxiety and depression who was admitted on 05/11/2020 with altered mental status and hypertensive emergency.  Imaging revealed hemorrhage of the left thalamus with intro ventricular extension.   He was intubated and extubated 2x and has remained encephalopathic.  He is currently in a floor bed.  His albumin is 2.1.  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    Length of Stay: 27   Vital Signs: BP (!) 139/92 (BP Location: Right Arm)   Pulse 81   Temp 98.9 F (37.2 C) (Oral)   Resp (!) 40   Ht 5\' 10"  (1.778 m)   Wt 103.2 kg   SpO2 98%   BMI 32.65 kg/m  SpO2: SpO2: 98 % O2 Device: O2 Device: Simple Mask O2 Flow Rate: O2 Flow Rate (L/min): 4 L/min       Palliative Assessment/Data: 20%     Palliative Care Plan    Recommendations/Plan: Discussed with Dr. Radonna Ricker.  Will remove cor trak and take measures to improve his comfort level and to avoid suffering.  Further invasive care would be  futile and dangerous. Have discussed with 1 son and his mother.  I will continue to try to reach his other son Robb Matar. Patient would be best served by Jason [name].  Will have to gently convey this to the family and see if they agree.  Code Status:  DNR  Prognosis:  < 2 weeks Due to IVH, Fungemia, persistent encephalopathy, extensive bilateral DVTs with an inability to anticoagulate, place IVC filter, or proceed with thrombectomy.   Discharge Planning: Hospice facility vs Hospital death.  Care plan was discussed with Dr. [employer]  Thank you for allowing the Palliative Medicine Team to assist in the care of this patient.  Total time spent:  45 min.     Greater than 50%  of this time was spent counseling and coordinating care related to the above assessment and plan.  Radonna Ricker, PA-C Palliative Medicine  Please contact Palliative MedicineTeam phone at [phone] for questions and concerns between 7 am - 7 pm.   Please see AMION for individual provider pager numbers.

## 2020-06-07 NOTE — Progress Notes (Signed)
Lower extremity venous bilateral study completed.  Preliminary results relayed to RN in room during examination as well as David Stall, MD after examination.   See CV Proc for preliminary results report.   Jean Rosenthal, RDMS, RVT

## 2020-06-07 NOTE — Progress Notes (Addendum)
[facility] PROGRESS NOTE    Progress Note  Jason [name]  OIB:[id number] DOB: 04-01-1950 DOA: 05/11/2020 PCP: Jodi Marble, MD     Brief Narrative:   Jason [name] is an 70 y.o. 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  He received Kcentra for reversal of Eliquis was intubated and had a ventricular drain placed on 05/12/2020.  Extubated on 05/16/2020, EVD dislodged on 05/20/2020, was was reintubated on 05/20/2020 eventually extubated on 05/23/2020 and placed on BiPAP.  [facility] where patient has been persistently encephalopathic, palliative care was consulted.  Started developing sepsis physiology on 05/31/2020, after discussion with Perative care CODE STATUS was changed to DNR.  Blood cultures were positive for Candida laparotomy and started on antifungal.  Speech evaluated the patient and recommended a dysphagia 1 diet, his mentation continues to wax and wane  Significant Events: 05/11/2020 admission with a thalamic hemorrhagic infarct and intubated. Extubated on 05/23/2020.  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 05/18/2020 repeated CT of the head without contrast that showed similar size left thalamic intraparenchymal hemorrhage, with an interval placement of a right parietal ventriculostomy catheter with the tip in the right lateral ventricle 05/21/2020 CT of the head that showed interval removal of right frontal ventriculostomy, stable mild ventriculomegaly and ventricular hemorrhage 05/21/2020 CT angio of the head and neck showed negative large vessel occlusion.  Extensive atheromatous plaque. 05/31/2020 CT of the head showed decrease  interventricular hemorrhage with layering within the occipital horns bilaterally.  Antibiotics: 06/05/2020 IV Diflucan  Microbiology data: 05/31/2020 blood culture: Candida glabrata 06/02/2020 blood cultures: Candida glabrata 06/04/2020 blood culture 1 out of 2 positive for staph epidermidis  Procedures: 05/12/2020 EVD placed due to worsening neurological status and high intracranial pressure.  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 Neurosurgery and neurology were consulted had an external ventricular drain placed from 2/25 2 05/20/2020. EEG was noted to have generalized slowing severe diffuse encephalopathy he was started on Keppra for seizure prophylaxis. Currently with on tube feeds, on 3/18 had mild improvement in his mental status and diet was upgraded to a dysphagia 1 diet. There is no plan for a PEG tube. Remains with a core track until his oral intake increases.  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 MRI of the brain without leptomeningeal enhancement, probable source decubitus wounds. ID recommended ophthalmology evaluation which was discussed with Dr. Valetta Close by the previous physician. Repeated blood cultures on 06/02/2020 was positive for Candida. Fevers are improving. Ophthalmology evaluation showed no signs of fungal endophthalmitis.  Intraretinal hemorrhage of the right eye small likely due to diabetic retinopathy, with nonproliferative diabetic retinopathy.  Acute respiratory failure with hypoxia: Secondary to hydrocephalus and intracranial hemorrhage.  Intubated eventually extubated on 06/02/2020. Continues to be intermittent tachypneic seen by PCCM multiple times.  Persistent A.  fib/a flutter: Eliquis has been discontinued due to intracranial hemorrhage. With a heart rate overnight into the 130s currently on on oral Cardizem and oral metoprolol. We will go ahead and start on metoprolol IV  as needed for heart rate greater than 100.  Chronic systolic heart failure: With an EF of 45% does not appear to be fluid overloaded on physical exam.  Currently on free water per tube. Hold Lasix for today he is negative about 5 L continue to monitor strict I's and O's.  Hypovolemic hypernatremia: He is currently on IV Lasix and free water per tube, will hold Lasix continue free water flushes. Recheck a basic metabolic panel tomorrow morning.  Hypertensive emergency/uncontrolled hypertension: Now normotensive on Cardizem and metoprolol.  History of CAD: Stable continue cardiac meds.  Diabetes mellitus type 2 uncontrolled with an A1c of 9.4: Continue Lantus plus sliding scale.  Blood glucose fairly controlled.  Thrombocytopenia: Likely secondary to sepsis currently on antifungal. Lovenox discontinued for 5 days ago. HIT panel pending after discontinuation of Lovenox, his platelets are trending up.  New DVT B/L: Lower extremity Doppler was positive bilateral lower extremity DVT extensive. At this point in time we cannot place a filter due to his fungemia, he cannot be placed on anticoagulation due to severe thrombocytopenia and his recent left thalamic thalamic hemorrhagic stroke. Unfortunately at this point in time there is no further viable option for this patient except for comfort care. I am going to recommend to the family to move towards comfort care and it is futile to continue to treat Jason [name] as we have no viable options for his newly diagnosed DVT.  He has had minimal improvement in his cognition.  Goals of care: Patient made a DNR, he has a very poor prognosis.   No good options at this point in time with newly diagnosed DVT New thalamic hemorrhagic stroke.  I  recommend moving towards comfort care. Recommend discontinuing core track. Perative care is working with the family but the family does not seem to understand or grasp the gravity of the situation.  I will try to contact the son Jason [name] to have a discussion about is that end-of-life.  Stage II sacral decubitus ulcer: RN Pressure Injury Documentation: Pressure Injury 05/24/20 Buttocks Bilateral;Medial Deep Tissue Pressure Injury - Purple or maroon localized area of discolored intact skin or blood-filled blister due to damage of underlying soft tissue from pressure and/or shear. Multiple blisters along (Active)  05/24/20 0800  Location: Buttocks  Location Orientation: Bilateral;Medial  Staging: Deep Tissue Pressure Injury - Purple or maroon localized area of discolored intact skin or blood-filled blister due to damage of underlying soft tissue from pressure and/or shear.  Wound Description (Comments): Multiple blisters along with deep tissue injury.  Present on Admission: No     DVT prophylaxis: SCD Family Communication:Son Status is: Inpatient  Remains inpatient appropriate because:Hemodynamically unstable   Dispo: The patient is from: Home              Anticipated d/c is to: SNF              Patient currently is not medically stable to d/c.   Difficult to place patient No        Code Status:     Code Status Orders  (From admission, onward)         Start     Ordered   06/01/20 1535  Do not attempt resuscitation (DNR)  Continuous       Question Answer Comment  In the event of cardiac or respiratory ARREST Do not call a "code blue"   In the event of cardiac or respiratory ARREST Do not perform Intubation, CPR, defibrillation or ACLS  In the event of cardiac or respiratory ARREST Use medication by any route, position, wound care, and other measures to relive pain and suffering. May use oxygen, suction and manual treatment of airway obstruction as needed for comfort.       06/01/20 1534        Code Status History    Date Active Date Inactive Code Status Order ID Comments User Context   05/11/2020 2334 06/01/2020 1534 Full Code [id number]  Lorenza Chick, MD Inpatient   02/26/2020 1753 03/09/2020 0325 Full Code [id number]  Marianna Payment, MD ED   Advance Care Planning Activity        IV Access:    Peripheral IV   Procedures and diagnostic studies:   DG CHEST PORT 1 VIEW  Result Date: 06/06/2020 CLINICAL DATA:  70 year old male with respiratory distress. EXAM: PORTABLE CHEST 1 VIEW COMPARISON:  Chest radiograph dated 06/04/2020. FINDINGS: Feeding tube extends below the diaphragm. No interval change in the patchy left lung base density compared to prior radiograph. No large pleural effusion. No pneumothorax. Stable cardiomediastinal silhouette. Atherosclerotic calcification of the aorta. Median sternotomy wires and CABG vascular clips. No acute osseous pathology. Old healed right posterior rib fractures. IMPRESSION: No interval change. Electronically Signed   By: Anner Crete M.D.   On: 06/06/2020 02:45     Medical Consultants:    None.   Subjective:    Jason [name] nonverbal  Objective:    Vitals:   06/06/20 2315 06/07/20 0351 06/07/20 0500 06/07/20 0811  BP: 128/80 (!) 140/99  (!) 137/97  Pulse: 70 (!) 137  (!) 139  Resp: (!) 42 (!) 40  (!) 40  Temp: 98.3 F (36.8 C) 98.9 F (37.2 C)  99.1 F (37.3 C)  TempSrc: Oral Oral  Oral  SpO2: 99% 98%  97%  Weight:   103.2 kg   Height:       SpO2: 97 % O2 Flow Rate (L/min): 4 L/min FiO2 (%): 30 %   Intake/Output Summary (Last 24 hours) at 06/07/2020 0845 Last data filed at 06/07/2020 0630 Gross per 24 hour  Intake 440 ml  Output 3450 ml  Net -3010 ml   Filed Weights   06/03/20 0500 06/04/20 0500 06/07/20 0500  Weight: 102.5 kg 100.9 kg 103.2 kg    Exam: General exam: In no acute distress. Respiratory system: Good air movement and clear to auscultation. Cardiovascular  system: S1 & S2 heard, RRR. No JVD. Gastrointestinal system: Abdomen is nondistended, soft and nontender.  Extremities: No pedal edema. Skin: No rashes, lesions or ulcers Psychiatry: No judgment and insight of medical condition only responsive to name and not site stimuli   Data Reviewed:    Labs: Basic Metabolic Panel: Recent Labs  Lab 06/03/20 0117 06/04/20 0326 06/05/20 0433 06/06/20 0435 06/07/20 0419  NA 146* 146* 146* 149* 150*  K 3.8 3.5 3.5 3.0* 3.0*  CL 116* 114* 116* 119* 121*  CO2 23 21* 23 21* 22  GLUCOSE 224* 154* 254* 199* 210*  BUN 56* 51* 53* 53* 58*  CREATININE 2.22* 1.85* 1.79* 1.79* 1.89*  CALCIUM 7.7* 7.9* 8.0* 8.0* 8.2*   GFR Estimated Creatinine Clearance: 43.2 mL/min (A) (by C-G formula based on SCr of 1.89 mg/dL (H)). Liver Function Tests: Recent Labs  Lab 06/05/20 0433  AST 69*  ALT 127*  ALKPHOS 385*  BILITOT 0.7  PROT 5.8*  ALBUMIN 1.7*   No results for input(s): LIPASE, AMYLASE in the last 168 hours. No results for input(s):  AMMONIA in the last 168 hours. Coagulation profile No results for input(s): INR, PROTIME in the last 168 hours. COVID-19 Labs  No results for input(s): DDIMER, FERRITIN, LDH, CRP in the last 72 hours.  Lab Results  Component Value Date   SARSCOV2NAA NEGATIVE 05/11/2020   SARSCOV2NAA NEGATIVE 03/08/2020   Long NEGATIVE 02/26/2020    CBC: Recent Labs  Lab 06/03/20 0117 06/04/20 0326 06/05/20 0433 06/06/20 0435 06/07/20 0419  WBC 6.4 7.7 7.1 9.1 10.1  HGB 12.3* 12.7* 14.7 12.8* 12.8*  HCT 39.2 39.6 44.7 39.5 40.1  MCV 88.7 86.7 85.6 85.9 88.3  PLT 79* 58* 43* 65* 82*   Cardiac Enzymes: No results for input(s): CKTOTAL, CKMB, CKMBINDEX, TROPONINI in the last 168 hours. BNP (last 3 results) No results for input(s): PROBNP in the last 8760 hours. CBG: Recent Labs  Lab 06/06/20 1548 06/06/20 1956 06/06/20 2316 06/07/20 0352 06/07/20 0814  GLUCAP 219* 242* 220* 199* 175*   D-Dimer: No  results for input(s): DDIMER in the last 72 hours. Hgb A1c: No results for input(s): HGBA1C in the last 72 hours. Lipid Profile: No results for input(s): CHOL, HDL, LDLCALC, TRIG, CHOLHDL, LDLDIRECT in the last 72 hours. Thyroid function studies: No results for input(s): TSH, T4TOTAL, T3FREE, THYROIDAB in the last 72 hours.  Invalid input(s): FREET3 Anemia work up: No results for input(s): VITAMINB12, FOLATE, FERRITIN, TIBC, IRON, RETICCTPCT in the last 72 hours. Sepsis Labs: Recent Labs  Lab 05/31/20 0933 05/31/20 1227 05/31/20 1534 06/01/20 0435 06/01/20 0435 06/02/20 0404 06/03/20 0117 06/04/20 0326 06/05/20 0433 06/06/20 0435 06/07/20 0419  PROCALCITON  --   --  2.35 3.50  --  2.52  --   --   --   --   --   WBC  --   --   --  7.3   < > 4.9   < > 7.7 7.1 9.1 10.1  LATICACIDVEN 2.0* 2.1*  --   --   --   --   --   --   --   --   --    < > = values in this interval not displayed.   Microbiology Recent Results (from the past 240 hour(s))  Culture, blood (routine x 2)     Status: Abnormal (Preliminary result)   Collection Time: 05/31/20  9:28 AM   Specimen: BLOOD  Result Value Ref Range Status   Specimen Description BLOOD LEFT ANTECUBITAL  Final   Special Requests   Final    BOTTLES DRAWN AEROBIC AND ANAEROBIC Blood Culture results may not be optimal due to an inadequate volume of blood received in culture bottles   Culture  Setup Time (A)  Final    YEAST IN BOTH AEROBIC AND ANAEROBIC BOTTLES CRITICAL RESULT CALLED TO, READ BACK BY AND VERIFIED WITH: [place] J. 1326 [id number] FCP    Culture (A)  Final    CANDIDA GLABRATA Sent to [facility] for further susceptibility testing. Performed at [facility], [place] [address] [place], [place] [address]    Report Status PENDING  Incomplete  Blood Culture ID Panel (Reflexed)     Status: Abnormal   Collection Time: 05/31/20  9:28 AM  Result Value Ref Range Status   Enterococcus faecalis NOT DETECTED NOT DETECTED Final    Enterococcus Faecium NOT DETECTED NOT DETECTED Final   Listeria monocytogenes NOT DETECTED NOT DETECTED Final   Staphylococcus species NOT DETECTED NOT DETECTED Final   Staphylococcus aureus (BCID) NOT DETECTED NOT DETECTED Final  Staphylococcus epidermidis NOT DETECTED NOT DETECTED Final   Staphylococcus lugdunensis NOT DETECTED NOT DETECTED Final   Streptococcus species NOT DETECTED NOT DETECTED Final   Streptococcus agalactiae NOT DETECTED NOT DETECTED Final   Streptococcus pneumoniae NOT DETECTED NOT DETECTED Final   Streptococcus pyogenes NOT DETECTED NOT DETECTED Final   A.calcoaceticus-baumannii NOT DETECTED NOT DETECTED Final   Bacteroides fragilis NOT DETECTED NOT DETECTED Final   Enterobacterales NOT DETECTED NOT DETECTED Final   Enterobacter cloacae complex NOT DETECTED NOT DETECTED Final   Escherichia coli NOT DETECTED NOT DETECTED Final   Klebsiella aerogenes NOT DETECTED NOT DETECTED Final   Klebsiella oxytoca NOT DETECTED NOT DETECTED Final   Klebsiella pneumoniae NOT DETECTED NOT DETECTED Final   Proteus species NOT DETECTED NOT DETECTED Final   Salmonella species NOT DETECTED NOT DETECTED Final   Serratia marcescens NOT DETECTED NOT DETECTED Final   Haemophilus influenzae NOT DETECTED NOT DETECTED Final   Neisseria meningitidis NOT DETECTED NOT DETECTED Final   Pseudomonas aeruginosa NOT DETECTED NOT DETECTED Final   Stenotrophomonas maltophilia NOT DETECTED NOT DETECTED Final   Candida albicans NOT DETECTED NOT DETECTED Final   Candida auris NOT DETECTED NOT DETECTED Final   Candida glabrata DETECTED (A) NOT DETECTED Final    Comment: CRITICAL RESULT CALLED TO, READ BACK BY AND VERIFIED WITH: PHARMD THOMAS J. 1326 [id number] FCP    Candida krusei NOT DETECTED NOT DETECTED Final   Candida parapsilosis NOT DETECTED NOT DETECTED Final   Candida tropicalis NOT DETECTED NOT DETECTED Final   Cryptococcus neoformans/gattii NOT DETECTED NOT DETECTED Final    Comment:  Performed at [facility], [address], [place], [place] [address]  Antifungal AST 9 Drug Panel     Status: None   Collection Time: 05/31/20  9:28 AM  Result Value Ref Range Status   Organism ID, Yeast Candida glabrata  Corrected    Comment: (NOTE) Identification performed by account, not confirmed by this laboratory. CORRECTED ON 03/22 AT 1036: PREVIOUSLY REPORTED AS Preliminary report    Amphotericin B MIC 1.0 ug/mL  Final    Comment: (NOTE) *Breakpoints have been established for only some organism-drug combinations as indicated. Results of this test are labeled for research purposes only by the assay's manufacturer. The performance characteristics of this assay have not been established by the manufacturer. The result should not be used for treatment or for diagnostic purposes without confirmation of the diagnosis by another medically established diagnostic product or procedure. The performance characteristics were determined by [facility].    Please Note: PENDING  Incomplete   Anidulafungin MIC Comment  Final    Comment: (NOTE) 0.03 ug/mL Susceptible *Breakpoints have been established for only some organism-drug combinations as indicated. Results of this test are labeled for research purposes only by the assay's manufacturer. The performance characteristics of this assay have not been established by the manufacturer. The result should not be used for treatment or for diagnostic purposes without confirmation of the diagnosis by another medically established diagnostic product or procedure. The performance characteristics were determined by [facility].    Caspofungin MIC Comment  Final    Comment: 0.06 ug/mL Susceptible   Micafungin MIC Comment  Final    Comment: 0.016 ug/mL Susceptible   Posaconazole MIC 1.0 ug/mL  Final    Comment: (NOTE) *Breakpoints have been established for only some organism-drug combinations as indicated. Results of this test are labeled for  research purposes only by the assay's manufacturer. The performance characteristics of this assay have  not been established by the manufacturer. The result should not be used for treatment or for diagnostic purposes without confirmation of the diagnosis by another medically established diagnostic product or procedure. The performance characteristics were determined by [facility].    Please Note: PENDING  Incomplete   Fluconazole Islt MIC 16.0 ug/mL  Final    Comment: Susceptible Dose Dependent   Flucytosine MIC 0.06 ug/mL or less  Final   Itraconazole MIC 1.0 ug/mL  Final   Voriconazole MIC 0.5 ug/mL  Final    Comment: (NOTE) Performed At: [facility] [place], [place] [id number] Rush Farmer MD PO:[id number]    Source PENDING  Incomplete  Culture, blood (routine x 2)     Status: Abnormal (Preliminary result)   Collection Time: 05/31/20  9:36 AM   Specimen: BLOOD LEFT HAND  Result Value Ref Range Status   Specimen Description BLOOD LEFT HAND  Final   Special Requests   Final    BOTTLES DRAWN AEROBIC AND ANAEROBIC Blood Culture results may not be optimal due to an inadequate volume of blood received in culture bottles   Culture  Setup Time   Final    YEAST IN BOTH AEROBIC AND ANAEROBIC BOTTLES CRITICAL VALUE NOTED.  VALUE IS CONSISTENT WITH PREVIOUSLY REPORTED AND CALLED VALUE. Performed at [facility], [place] [address], [place], [place] [address]    Culture CANDIDA GLABRATA (A)  Final   Report Status PENDING  Incomplete  MRSA PCR Screening     Status: None   Collection Time: 05/31/20  3:52 PM   Specimen: Nasal Mucosa; Nasopharyngeal  Result Value Ref Range Status   MRSA by PCR NEGATIVE NEGATIVE Final    Comment:        The GeneXpert MRSA Assay (FDA approved for NASAL specimens only), is one component of a comprehensive MRSA colonization surveillance program. It is not intended to diagnose MRSA infection nor to guide or monitor treatment  for MRSA infections. Performed at [facility], [place] [address] [place], [place] [address]   Culture, blood (Routine X 2) w Reflex to ID Panel     Status: None (Preliminary result)   Collection Time: 06/02/20 10:08 AM   Specimen: BLOOD  Result Value Ref Range Status   Specimen Description BLOOD LEFT ANTECUBITAL  Final   Special Requests   Final    BOTTLES DRAWN AEROBIC ONLY Blood Culture adequate volume   Culture  Setup Time   Final    YEAST AEROBIC BOTTLE ONLY CRITICAL VALUE NOTED.  VALUE IS CONSISTENT WITH PREVIOUSLY REPORTED AND CALLED VALUE.    Culture   Final    YEAST IDENTIFICATION TO FOLLOW Performed at [facility], [place] [address], [place], [place] [address]    Report Status PENDING  Incomplete  Culture, blood (Routine X 2) w Reflex to ID Panel     Status: None (Preliminary result)   Collection Time: 06/02/20 10:08 AM   Specimen: BLOOD LEFT HAND  Result Value Ref Range Status   Specimen Description BLOOD LEFT HAND  Final   Special Requests   Final    BOTTLES DRAWN AEROBIC ONLY Blood Culture adequate volume   Culture  Setup Time   Final    YEAST WITH PSEUDOHYPHAE AEROBIC BOTTLE ONLY CRITICAL RESULT CALLED TO, READ BACK BY AND VERIFIED WITH: J. Judie Bonus PHARMD, AT 3267 06/05/20 [place]    Culture   Final    YEAST IDENTIFICATION TO FOLLOW Performed at [facility], [address], [place], [place] [address]    Report Status PENDING  Incomplete  Culture, blood (routine x 2)     Status: Abnormal   Collection Time: 06/04/20  3:19 PM   Specimen: BLOOD  Result Value Ref Range Status   Specimen Description BLOOD LEFT ANTECUBITAL  Final   Special Requests   Final    BOTTLES DRAWN AEROBIC AND ANAEROBIC Blood Culture results may not be optimal due to an inadequate volume of blood received in culture bottles   Culture  Setup Time   Final    GRAM POSITIVE COCCI IN CLUSTERS ANAEROBIC BOTTLE ONLY CRITICAL RESULT CALLED TO, READ BACK BY AND VERIFIED WITH:  PHARMD [place] C 2000 [id number] FCP    Culture (A)  Final    STAPHYLOCOCCUS EPIDERMIDIS THE SIGNIFICANCE OF ISOLATING THIS ORGANISM FROM A SINGLE SET OF BLOOD CULTURES WHEN MULTIPLE SETS ARE DRAWN IS UNCERTAIN. PLEASE NOTIFY THE MICROBIOLOGY DEPARTMENT WITHIN ONE WEEK IF SPECIATION AND SENSITIVITIES ARE REQUIRED. Performed at [facility], [place] [address], [place], [place] [address]    Report Status 06/07/2020 FINAL  Final  Blood Culture ID Panel (Reflexed)     Status: Abnormal   Collection Time: 06/04/20  3:19 PM  Result Value Ref Range Status   Enterococcus faecalis NOT DETECTED NOT DETECTED Final   Enterococcus Faecium NOT DETECTED NOT DETECTED Final   Listeria monocytogenes NOT DETECTED NOT DETECTED Final   Staphylococcus species DETECTED (A) NOT DETECTED Final    Comment: CRITICAL RESULT CALLED TO, READ BACK BY AND VERIFIED WITH: PHARMD JESSICA C 2000 [id number] FCP    Staphylococcus aureus (BCID) NOT DETECTED NOT DETECTED Final   Staphylococcus epidermidis DETECTED (A) NOT DETECTED Final    Comment: Methicillin (oxacillin) resistant coagulase negative staphylococcus. Possible blood culture contaminant (unless isolated from more than one blood culture draw or clinical case suggests pathogenicity). No antibiotic treatment is indicated for blood  culture contaminants. CRITICAL RESULT CALLED TO, READ BACK BY AND VERIFIED WITH: PHARMD JESSICA C 2000 [id number] FCP    Staphylococcus lugdunensis NOT DETECTED NOT DETECTED Final   Streptococcus species NOT DETECTED NOT DETECTED Final   Streptococcus agalactiae NOT DETECTED NOT DETECTED Final   Streptococcus pneumoniae NOT DETECTED NOT DETECTED Final   Streptococcus pyogenes NOT DETECTED NOT DETECTED Final   A.calcoaceticus-baumannii NOT DETECTED NOT DETECTED Final   Bacteroides fragilis NOT DETECTED NOT DETECTED Final   Enterobacterales NOT DETECTED NOT DETECTED Final   Enterobacter cloacae complex NOT DETECTED NOT DETECTED Final    Escherichia coli NOT DETECTED NOT DETECTED Final   Klebsiella aerogenes NOT DETECTED NOT DETECTED Final   Klebsiella oxytoca NOT DETECTED NOT DETECTED Final   Klebsiella pneumoniae NOT DETECTED NOT DETECTED Final   Proteus species NOT DETECTED NOT DETECTED Final   Salmonella species NOT DETECTED NOT DETECTED Final   Serratia marcescens NOT DETECTED NOT DETECTED Final   Haemophilus influenzae NOT DETECTED NOT DETECTED Final   Neisseria meningitidis NOT DETECTED NOT DETECTED Final   Pseudomonas aeruginosa NOT DETECTED NOT DETECTED Final   Stenotrophomonas maltophilia NOT DETECTED NOT DETECTED Final   Candida albicans NOT DETECTED NOT DETECTED Final   Candida auris NOT DETECTED NOT DETECTED Final   Candida glabrata NOT DETECTED NOT DETECTED Final   Candida krusei NOT DETECTED NOT DETECTED Final   Candida parapsilosis NOT DETECTED NOT DETECTED Final   Candida tropicalis NOT DETECTED NOT DETECTED Final   Cryptococcus neoformans/gattii NOT DETECTED NOT DETECTED Final   Methicillin resistance mecA/C DETECTED (A)  NOT DETECTED Final    Comment: CRITICAL RESULT CALLED TO, READ BACK BY AND VERIFIED WITH: PHARMD JESSICA C 2000 [id number] FCP Performed at [facility], [place] [address], [place], [place] [address]   Culture, blood (routine x 2)     Status: None (Preliminary result)   Collection Time: 06/04/20  3:20 PM   Specimen: BLOOD RIGHT HAND  Result Value Ref Range Status   Specimen Description BLOOD RIGHT HAND  Final   Special Requests   Final    BOTTLES DRAWN AEROBIC ONLY Blood Culture results may not be optimal due to an inadequate volume of blood received in culture bottles   Culture   Final    NO GROWTH 3 DAYS Performed at [facility], [place] [address], [place], [place] [address]    Report Status PENDING  Incomplete     Medications:   . chlorhexidine gluconate (MEDLINE KIT)  15 mL Mouth Rinse BID  . diltiazem  30 mg Oral Q8H  . feeding supplement (PROSource TF)  45 mL Per  Tube TID  . fluconazole  800 mg Oral Daily  . free water  300 mL Per Tube Q4H  . furosemide  40 mg Intravenous Q12H  . insulin aspart  0-20 Units Subcutaneous Q4H  . insulin glargine  15 Units Subcutaneous Daily  . levETIRAcetam  750 mg Per Tube BID  . mouth rinse  15 mL Mouth Rinse q12n4p  . metoprolol tartrate  50 mg Per Tube BID  . pantoprazole sodium  40 mg Per Tube Daily  . thiamine  100 mg Per Tube Daily   Continuous Infusions: . sodium chloride    . feeding supplement (JEVITY 1.5 CAL/FIBER) 1,000 mL (06/06/20 0916)      LOS: 27 days   Charlynne Cousins  [facility]  06/07/2020, 8:45 AM

## 2020-06-08 DIAGNOSIS — I824Z3 Acute embolism and thrombosis of unspecified deep veins of distal lower extremity, bilateral: Secondary | ICD-10-CM

## 2020-06-08 DIAGNOSIS — I483 Typical atrial flutter: Secondary | ICD-10-CM | POA: Diagnosis not present

## 2020-06-08 DIAGNOSIS — O2230 Deep phlebothrombosis in pregnancy, unspecified trimester: Secondary | ICD-10-CM

## 2020-06-08 DIAGNOSIS — J9601 Acute respiratory failure with hypoxia: Secondary | ICD-10-CM | POA: Diagnosis not present

## 2020-06-08 DIAGNOSIS — T17908D Unspecified foreign body in respiratory tract, part unspecified causing other injury, subsequent encounter: Secondary | ICD-10-CM | POA: Diagnosis not present

## 2020-06-08 DIAGNOSIS — I824Z1 Acute embolism and thrombosis of unspecified deep veins of right distal lower extremity: Secondary | ICD-10-CM | POA: Diagnosis not present

## 2020-06-08 LAB — BASIC METABOLIC PANEL
Anion gap: 10 (ref 5–15)
BUN: 67 mg/dL — ABNORMAL HIGH (ref 8–23)
CO2: 24 mmol/L (ref 22–32)
Calcium: 8.1 mg/dL — ABNORMAL LOW (ref 8.9–10.3)
Chloride: 121 mmol/L — ABNORMAL HIGH (ref 98–111)
Creatinine, Ser: 2.2 mg/dL — ABNORMAL HIGH (ref 0.61–1.24)
GFR, Estimated: 31 mL/min — ABNORMAL LOW (ref >60)
Glucose, Bld: 217 mg/dL — ABNORMAL HIGH (ref 70–99)
Potassium: 3 mmol/L — ABNORMAL LOW (ref 3.5–5.1)
Sodium: 155 mmol/L — ABNORMAL HIGH (ref 135–145)

## 2020-06-08 LAB — GLUCOSE, CAPILLARY
Glucose-Capillary: 192 mg/dL — ABNORMAL HIGH (ref 70–99)
Glucose-Capillary: 193 mg/dL — ABNORMAL HIGH (ref 70–99)
Glucose-Capillary: 180 mg/dL — ABNORMAL HIGH (ref 70–99)

## 2020-06-08 MED ORDER — HALOPERIDOL LACTATE 2 MG/ML PO CONC
0.5000 mg | ORAL | Status: DC | PRN
  Filled 2020-06-08: qty 0.3

## 2020-06-08 MED ORDER — LORAZEPAM 2 MG/ML PO CONC: 1.0000 mg | ORAL | Status: DC | PRN

## 2020-06-08 MED ORDER — HALOPERIDOL 0.5 MG PO TABS: 0.5000 mg | ORAL_TABLET | ORAL | Status: DC | PRN

## 2020-06-08 MED ORDER — BIOTENE DRY MOUTH MT LIQD: 15.0000 mL | OROMUCOSAL | Status: DC | PRN

## 2020-06-08 MED ORDER — FLUCONAZOLE 200 MG PO TABS: 800.0000 mg | ORAL_TABLET | Freq: Every day | ORAL | Status: DC

## 2020-06-08 MED ORDER — GLYCOPYRROLATE 0.2 MG/ML IJ SOLN: 0.2000 mg | INTRAMUSCULAR | Status: DC | PRN

## 2020-06-08 MED ORDER — POLYVINYL ALCOHOL 1.4 % OP SOLN
1.0000 [drp] | Freq: Four times a day (QID) | OPHTHALMIC | Status: DC | PRN
  Filled 2020-06-08: qty 15

## 2020-06-08 MED ORDER — GLYCOPYRROLATE 1 MG PO TABS
1.0000 mg | ORAL_TABLET | ORAL | Status: DC | PRN
  Filled 2020-06-08: qty 1

## 2020-06-08 MED ORDER — DEXTROSE 5 % IV SOLN: INTRAVENOUS | Status: DC

## 2020-06-08 MED ORDER — MORPHINE SULFATE (PF) 2 MG/ML IV SOLN
2.0000 mg | INTRAVENOUS | Status: DC
  Administered 2020-06-08 – 2020-06-10 (×13): 2 mg via INTRAVENOUS
  Filled 2020-06-08 (×12): qty 1

## 2020-06-08 MED ORDER — LORAZEPAM 1 MG PO TABS: 1.0000 mg | ORAL_TABLET | ORAL | Status: DC | PRN

## 2020-06-08 MED ORDER — HALOPERIDOL LACTATE 5 MG/ML IJ SOLN: 0.5000 mg | INTRAMUSCULAR | Status: DC | PRN

## 2020-06-08 MED ORDER — SODIUM CHLORIDE 0.9 % IV SOLN
750.0000 mg | Freq: Once | INTRAVENOUS | Status: AC
  Administered 2020-06-08: 750 mg via INTRAVENOUS
  Filled 2020-06-08: qty 7.5

## 2020-06-08 MED ORDER — LORAZEPAM 2 MG/ML IJ SOLN: 1.0000 mg | INTRAMUSCULAR | Status: DC | PRN

## 2020-06-08 MED ORDER — LEVETIRACETAM 750 MG PO TABS
750.0000 mg | ORAL_TABLET | Freq: Two times a day (BID) | ORAL | Status: DC
  Administered 2020-06-09: 750 mg via ORAL
  Filled 2020-06-08 (×2): qty 1

## 2020-06-08 NOTE — TOC Progression Note (Signed)
Transition of Care [facility]) - Progression Note    Patient Details  Name: Jason [name] MRN: [id number] Date of Birth: 1950/12/17  [facility] CM/SW Contact  Kermit Balo, RN Phone Number: 06/08/2020, 4:13 PM  Clinical Narrative:    Per referral from palliative care: pts family interested in Hospice in [place]. CM has called and placed referral.  TOC following.   Expected Discharge Plan: Skilled Nursing Facility Barriers to Discharge: Continued Medical Work up  Expected Discharge Plan and Services Expected Discharge Plan: Skilled Nursing Facility In-house Referral: Clinical Social Work     Living arrangements for the past 2 months: Skilled Nursing Facility                                       Social Determinants of Health (SDOH) Interventions    Readmission Risk Interventions No flowsheet data found.

## 2020-06-08 NOTE — Progress Notes (Signed)
Subjective: Patient is lethargic today  Antibiotics:  Anti-infectives (From admission, onward)   Start     Dose/Rate Route Frequency Ordered Stop   06/09/20 1000  fluconazole (DIFLUCAN) tablet 800 mg        800 mg Per Tube Daily 06/08/20 1127     06/06/20 1200  fluconazole (DIFLUCAN) tablet 800 mg  Status:  Discontinued        800 mg Oral Daily 06/06/20 1110 06/08/20 1127   06/03/20 1300  anidulafungin (ERAXIS) 100 mg in sodium chloride 0.9 % 100 mL IVPB  Status:  Discontinued       "Followed by" Linked Group Details   100 mg 78 mL/hr over 100 Minutes Intravenous Every 24 hours 06/02/20 1119 06/06/20 1110   06/02/20 1600  fluconazole (DIFLUCAN) IVPB 400 mg  Status:  Discontinued        400 mg 100 mL/hr over 120 Minutes Intravenous Every 24 hours 06/02/20 1119 06/02/20 1712   06/02/20 1300  anidulafungin (ERAXIS) 200 mg in sodium chloride 0.9 % 200 mL IVPB       "Followed by" Linked Group Details   200 mg 78 mL/hr over 200 Minutes Intravenous  Once 06/02/20 1119 06/02/20 2142   06/02/20 1200  anidulafungin (ERAXIS) 100 mg in sodium chloride 0.9 % 100 mL IVPB  Status:  Discontinued        100 mg 78 mL/hr over 100 Minutes Intravenous Every 24 hours 06/01/20 1230 06/01/20 1249   06/02/20 1000  voriconazole (VFEND) tablet 200 mg  Status:  Discontinued        200 mg Oral Every 12 hours 06/01/20 1347 06/01/20 1436   06/02/20 1000  voriconazole (VFEND) tablet 200 mg  Status:  Discontinued        200 mg Per Tube Every 12 hours 06/01/20 1436 06/02/20 1119   06/01/20 1445  voriconazole (VFEND) tablet 400 mg        400 mg Per Tube Every 12 hours 06/01/20 1436 06/01/20 2145   06/01/20 1430  voriconazole (VFEND) tablet 400 mg  Status:  Discontinued        400 mg Oral Every 12 hours 06/01/20 1347 06/01/20 1436   06/01/20 1330  anidulafungin (ERAXIS) 200 mg in sodium chloride 0.9 % 200 mL IVPB  Status:  Discontinued        200 mg 78 mL/hr over 200 Minutes Intravenous  Once 06/01/20  1230 06/01/20 1249   06/01/20 1300  vancomycin (VANCOREADY) IVPB 1000 mg/200 mL  Status:  Discontinued        1,000 mg 200 mL/hr over 60 Minutes Intravenous Every 24 hours 05/31/20 1323 06/01/20 0928   05/31/20 1900  piperacillin-tazobactam (ZOSYN) IVPB 3.375 g  Status:  Discontinued        3.375 g 12.5 mL/hr over 240 Minutes Intravenous Every 8 hours 05/31/20 1012 06/02/20 1346   05/31/20 1100  vancomycin (VANCOREADY) IVPB 2000 mg/400 mL        2,000 mg 200 mL/hr over 120 Minutes Intravenous  Once 05/31/20 1012 05/31/20 1634   05/31/20 1100  piperacillin-tazobactam (ZOSYN) IVPB 3.375 g        3.375 g 100 mL/hr over 30 Minutes Intravenous  Once 05/31/20 1012 05/31/20 1406   05/21/20 1100  vancomycin (VANCOREADY) IVPB 1750 mg/350 mL  Status:  Discontinued        1,750 mg 175 mL/hr over 120 Minutes Intravenous Every 24 hours 05/20/20 0953 05/23/20 1046   05/20/20 1045  vancomycin (VANCOREADY) IVPB 2000 mg/400 mL        2,000 mg 200 mL/hr over 120 Minutes Intravenous  Once 05/20/20 0948 05/20/20 1341   05/19/20 1015  cefTRIAXone (ROCEPHIN) 2 g in sodium chloride 0.9 % 100 mL IVPB  Status:  Discontinued        2 g 200 mL/hr over 30 Minutes Intravenous Every 24 hours 05/18/20 0958 05/18/20 1132   05/18/20 1400  piperacillin-tazobactam (ZOSYN) IVPB 3.375 g  Status:  Discontinued        3.375 g 12.5 mL/hr over 240 Minutes Intravenous Every 8 hours 05/18/20 1132 05/23/20 1046   05/17/20 1015  cefTRIAXone (ROCEPHIN) 1 g in sodium chloride 0.9 % 100 mL IVPB  Status:  Discontinued        1 g 200 mL/hr over 30 Minutes Intravenous Every 24 hours 05/17/20 0918 05/18/20 0958   05/12/20 1530  ceFAZolin (ANCEF) IVPB 1 g/50 mL premix  Status:  Discontinued        1 g 100 mL/hr over 30 Minutes Intravenous Every 8 hours 05/12/20 1440 05/17/20 0918      Medications: Scheduled Meds: . chlorhexidine gluconate (MEDLINE KIT)  15 mL Mouth Rinse BID  . diltiazem  60 mg Oral Q8H  . [START ON 06/09/2020]  fluconazole  800 mg Per Tube Daily  . insulin aspart  0-20 Units Subcutaneous Q4H  . levETIRAcetam  750 mg Per Tube BID  . mouth rinse  15 mL Mouth Rinse q12n4p  . metoprolol tartrate  50 mg Per Tube BID  . pantoprazole sodium  40 mg Per Tube Daily   Continuous Infusions: . sodium chloride    . dextrose 75 mL/hr at 06/08/20 1244   PRN Meds:.acetaminophen **OR** acetaminophen (TYLENOL) oral liquid 160 mg/5 mL **OR** acetaminophen, Gerhardt's butt cream, metoprolol tartrate, morphine injection, ondansetron (ZOFRAN) IV, polyethylene glycol, Resource ThickenUp Clear    Objective: Weight change:   Intake/Output Summary (Last 24 hours) at 06/08/2020 1303 Last data filed at 06/08/2020 1047 Gross per 24 hour  Intake 220 ml  Output 1550 ml  Net -1330 ml   Blood pressure 123/86, pulse 94, temperature (!) 97.4 F (36.3 C), temperature source Axillary, resp. rate (!) 37, height 5' 10" (1.778 m), weight 103.2 kg, SpO2 99 %. Temp:  [97.4 F (36.3 C)-99 F (37.2 C)] 97.4 F (36.3 C) (03/24 1213) Pulse Rate:  [68-103] 94 (03/24 1213) Resp:  [37-42] 37 (03/24 0806) BP: (101-142)/(63-86) 123/86 (03/24 1213) SpO2:  [95 %-99 %] 99 % (03/24 1213)  Physical Exam: Physical Exam HENT:     Head: Normocephalic.  Eyes:     Extraocular Movements: Extraocular movements intact.  Cardiovascular:     Rate and Rhythm: Tachycardia present.     Heart sounds: No murmur heard. No friction rub. No gallop.   Pulmonary:     Effort: No respiratory distress.     Breath sounds: No stridor. No wheezing.  Abdominal:     General: Bowel sounds are normal. There is no distension.  Skin:    General: Skin is warm and dry.      CBC:    BMET Recent Labs    06/07/20 0419 06/08/20 1018  NA 150* 155*  K 3.0* 3.0*  CL 121* 121*  CO2 22 24  GLUCOSE 210* 217*  BUN 58* 67*  CREATININE 1.89* 2.20*  CALCIUM 8.2* 8.1*     Liver Panel  No results for input(s): PROT, ALBUMIN, AST, ALT, ALKPHOS, BILITOT,  BILIDIR, IBILI in the  last 72 hours.     Sedimentation Rate No results for input(s): ESRSEDRATE in the last 72 hours. C-Reactive Protein No results for input(s): CRP in the last 72 hours.  Micro Results: Recent Results (from the past 720 hour(s))  Blood Cultures (routine x 2)     Status: None   Collection Time: 05/11/20  7:36 PM   Specimen: Site Not Specified; Blood  Result Value Ref Range Status   Specimen Description   Final    SITE NOT SPECIFIED Performed at [facility] [address] [place], [place] [address]    Special Requests   Final    BOTTLES DRAWN AEROBIC AND ANAEROBIC Blood Culture results may not be optimal due to an inadequate volume of blood received in culture bottles Performed at [facility] [address], [place], [place] [address]    Culture   Final    NO GROWTH 5 DAYS Performed at [facility], [place] [address] [place], [place] [address]    Report Status 05/16/2020 FINAL  Final  Blood Cultures (routine x 2)     Status: None   Collection Time: 05/11/20  7:36 PM   Specimen: BLOOD  Result Value Ref Range Status   Specimen Description   Final    BLOOD LEFT WRIST Performed at [facility] [address] [place], [place] [address]    Special Requests   Final    BOTTLES DRAWN AEROBIC AND ANAEROBIC Blood Culture adequate volume Performed at [facility] [address] [place], [place] [address]    Culture   Final    NO GROWTH 5 DAYS Performed at [facility], [place] [address], [place], [place] [address]    Report Status 05/16/2020 FINAL  Final  Urine culture     Status: None   Collection Time: 05/11/20  9:00 PM   Specimen: Urine, Random  Result Value Ref Range Status   Specimen Description   Final    URINE, RANDOM Performed at [facility] [address], [place], [place] [address]    Special Requests   Final    NONE Performed at [facility], [place] [address], [place], [place] [address]    Culture   Final    NO GROWTH Performed at [facility], [place] [address], [place], [place] [address]    Report Status 05/13/2020 FINAL  Final  Resp Panel by RT-PCR (Flu A&B, Covid) Nasopharyngeal Swab     Status: None   Collection Time: 05/11/20  9:30 PM   Specimen: Nasopharyngeal Swab; Nasopharyngeal(NP) swabs in vial transport medium  Result Value Ref Range Status   SARS Coronavirus 2 by RT PCR NEGATIVE NEGATIVE Final    Comment: (NOTE) SARS-CoV-2 target nucleic acids are NOT DETECTED.  The SARS-CoV-2 RNA is generally detectable in upper respiratory specimens during the acute phase of infection. The lowest concentration of SARS-CoV-2 viral copies this assay can detect is 138 copies/mL. A negative result does not preclude SARS-Cov-2 infection and should not be used as the sole basis for treatment or other patient management decisions. A negative result may occur with  improper specimen collection/handling, submission of specimen other than nasopharyngeal swab, presence of viral mutation(s) within the areas targeted by this assay, and inadequate number of viral copies(<138 copies/mL). A negative result must be combined with clinical observations, patient history, and epidemiological information. The expected result is Negative.  Fact Sheet for Patients:  EntrepreneurPulse.com.au  Fact Sheet for Healthcare Providers:  IncredibleEmployment.be  This test is no t yet approved or cleared by the [place] FDA and  has been authorized for detection and/or diagnosis of SARS-CoV-2 by FDA under an Emergency Use Authorization (EUA). This EUA will remain  in effect (meaning this test can be used) for the duration of the COVID-19 declaration under Section 564(b)(1) of the Act, 21 U.S.C.section 360bbb-3(b)(1), unless the authorization is terminated  or revoked sooner.        Influenza A by PCR NEGATIVE NEGATIVE Final   Influenza B by PCR NEGATIVE NEGATIVE Final    Comment: (NOTE) The Xpert Xpress SARS-CoV-2/FLU/RSV plus assay is intended as an aid in the diagnosis of influenza from Nasopharyngeal swab specimens and should not be used as a sole basis for treatment. Nasal washings and aspirates are unacceptable for Xpert Xpress SARS-CoV-2/FLU/RSV testing.  Fact Sheet for Patients: EntrepreneurPulse.com.au  Fact Sheet for Healthcare Providers: IncredibleEmployment.be  This test is not yet approved or cleared by the [place] FDA and has been authorized for detection and/or diagnosis of SARS-CoV-2 by FDA under an Emergency Use Authorization (EUA). This EUA will remain in effect (meaning this test can be used) for the duration of the COVID-19 declaration under Section 564(b)(1) of the Act, 21 U.S.C. section 360bbb-3(b)(1), unless the authorization is terminated or revoked.  Performed at [facility], [place] [address], [place], [place] [address]   MRSA PCR Screening     Status: None   Collection Time: 05/12/20 12:26 AM   Specimen: Nasopharyngeal Swab  Result Value Ref Range Status   MRSA by PCR NEGATIVE NEGATIVE Final    Comment:        The GeneXpert MRSA Assay (FDA approved for NASAL specimens only), is one component of a comprehensive MRSA colonization surveillance program. It is not intended to diagnose MRSA infection nor to guide or monitor treatment for MRSA infections. Performed at [facility], [place] [address] [place], [place] [address]   Expectorated Sputum Assessment w Gram Stain, Rflx to Resp Cult     Status: None   Collection Time: 05/17/20 11:29 PM   Specimen: Sputum  Result Value Ref Range Status   Specimen Description SPUTUM  Final   Special Requests NONE  Final   Sputum evaluation   Final    THIS SPECIMEN IS ACCEPTABLE FOR SPUTUM CULTURE Performed at [facility], [place] [address] [place], [place] [address]    Report Status 05/18/2020 FINAL  Final  Culture, Respiratory w Gram Stain     Status: None   Collection Time: 05/17/20 11:29 PM   Specimen: SPU  Result Value Ref Range Status   Specimen Description SPUTUM  Final   Special Requests NONE Reflexed from [id number]  Final   Gram Stain   Final    FEW WBC PRESENT, PREDOMINANTLY PMN FEW GRAM NEGATIVE RODS FEW GRAM POSITIVE COCCI IN PAIRS IN CLUSTERS RARE GRAM POSITIVE RODS    Culture   Final    FEW Normal respiratory flora-no Staph aureus or Pseudomonas seen Performed at [facility], [address], [place], [place] [address]    Report Status 05/20/2020 FINAL  Final  Culture, blood (Routine X 2) w Reflex to ID Panel     Status: None   Collection Time: 05/19/20 12:44 AM   Specimen: BLOOD  Result Value Ref Range Status   Specimen Description BLOOD RIGHT ARM  Final   Special Requests  Final    BOTTLES DRAWN AEROBIC AND ANAEROBIC Blood Culture adequate volume   Culture   Final    NO GROWTH 5 DAYS Performed at [facility], [place] [address], [place], [place] [address]    Report Status 05/24/2020 FINAL  Final  Culture, blood (Routine X 2) w Reflex to ID Panel     Status: None   Collection Time: 05/19/20 12:59 AM   Specimen: BLOOD RIGHT HAND  Result Value Ref Range Status   Specimen Description BLOOD RIGHT HAND  Final   Special Requests   Final    BOTTLES DRAWN AEROBIC AND ANAEROBIC Blood Culture results may not be optimal due to an inadequate volume of blood received in culture bottles   Culture   Final    NO GROWTH 5 DAYS Performed at [facility], [place] [address], [place], [place] [address]    Report Status 05/24/2020 FINAL  Final  Expectorated Sputum Assessment w Gram Stain, Rflx to Resp Cult     Status: None   Collection Time: 05/19/20  6:30 AM   Specimen: Expectorated Sputum  Result Value Ref Range Status   Specimen Description EXPECTORATED SPUTUM  Final    Special Requests Normal  Final   Sputum evaluation   Final    THIS SPECIMEN IS ACCEPTABLE FOR SPUTUM CULTURE Performed at [facility], [place] [address] [place], [place] [address]    Report Status 05/22/2020 FINAL  Final  Culture, Respiratory w Gram Stain     Status: None   Collection Time: 05/19/20  6:30 AM  Result Value Ref Range Status   Specimen Description EXPECTORATED SPUTUM  Final   Special Requests Normal Reflexed from [id number]  Final   Gram Stain   Final    ABUNDANT WBC PRESENT,BOTH PMN AND MONONUCLEAR FEW SQUAMOUS EPITHELIAL CELLS PRESENT FEW GRAM POSITIVE COCCI RARE GRAM VARIABLE ROD    Culture   Final    RARE Normal respiratory flora-no Staph aureus or Pseudomonas seen Performed at [facility], [place] [address], [place], [place] [address]    Report Status 05/21/2020 FINAL  Final  Culture, blood (routine x 2)     Status: Abnormal (Preliminary result)   Collection Time: 05/31/20  9:28 AM   Specimen: BLOOD  Result Value Ref Range Status   Specimen Description BLOOD LEFT ANTECUBITAL  Final   Special Requests   Final    BOTTLES DRAWN AEROBIC AND ANAEROBIC Blood Culture results may not be optimal due to an inadequate volume of blood received in culture bottles   Culture  Setup Time (A)  Final    YEAST IN BOTH AEROBIC AND ANAEROBIC BOTTLES CRITICAL RESULT CALLED TO, READ BACK BY AND VERIFIED WITH: [place] [id number] FCP    Culture (A)  Final    CANDIDA GLABRATA Sent to [facility] for further susceptibility testing. Performed at [facility], [place] [address], [place], [place] [address]    Report Status PENDING  Incomplete  Blood Culture ID Panel (Reflexed)     Status: Abnormal   Collection Time: 05/31/20  9:28 AM  Result Value Ref Range Status   Enterococcus faecalis NOT DETECTED NOT DETECTED Final   Enterococcus Faecium NOT DETECTED NOT DETECTED Final   Listeria monocytogenes NOT DETECTED NOT DETECTED Final   Staphylococcus species NOT DETECTED  NOT DETECTED Final   Staphylococcus aureus (BCID) NOT DETECTED NOT DETECTED Final   Staphylococcus epidermidis NOT DETECTED NOT DETECTED Final   Staphylococcus  lugdunensis NOT DETECTED NOT DETECTED Final   Streptococcus species NOT DETECTED NOT DETECTED Final   Streptococcus agalactiae NOT DETECTED NOT DETECTED Final   Streptococcus pneumoniae NOT DETECTED NOT DETECTED Final   Streptococcus pyogenes NOT DETECTED NOT DETECTED Final   A.calcoaceticus-baumannii NOT DETECTED NOT DETECTED Final   Bacteroides fragilis NOT DETECTED NOT DETECTED Final   Enterobacterales NOT DETECTED NOT DETECTED Final   Enterobacter cloacae complex NOT DETECTED NOT DETECTED Final   Escherichia coli NOT DETECTED NOT DETECTED Final   Klebsiella aerogenes NOT DETECTED NOT DETECTED Final   Klebsiella oxytoca NOT DETECTED NOT DETECTED Final   Klebsiella pneumoniae NOT DETECTED NOT DETECTED Final   Proteus species NOT DETECTED NOT DETECTED Final   Salmonella species NOT DETECTED NOT DETECTED Final   Serratia marcescens NOT DETECTED NOT DETECTED Final   Haemophilus influenzae NOT DETECTED NOT DETECTED Final   Neisseria meningitidis NOT DETECTED NOT DETECTED Final   Pseudomonas aeruginosa NOT DETECTED NOT DETECTED Final   Stenotrophomonas maltophilia NOT DETECTED NOT DETECTED Final   Candida albicans NOT DETECTED NOT DETECTED Final   Candida auris NOT DETECTED NOT DETECTED Final   Candida glabrata DETECTED (A) NOT DETECTED Final    Comment: CRITICAL RESULT CALLED TO, READ BACK BY AND VERIFIED WITH: PHARMD THOMAS J. [id number] FCP    Candida krusei NOT DETECTED NOT DETECTED Final   Candida parapsilosis NOT DETECTED NOT DETECTED Final   Candida tropicalis NOT DETECTED NOT DETECTED Final   Cryptococcus neoformans/gattii NOT DETECTED NOT DETECTED Final    Comment: Performed at [facility], [address], [place], [place] [address]  Antifungal AST 9 Drug Panel     Status: None   Collection Time: 05/31/20  9:28  AM  Result Value Ref Range Status   Organism ID, Yeast Candida glabrata  Corrected    Comment: (NOTE) Identification performed by account, not confirmed by this laboratory. CORRECTED ON 03/22 AT 1036: PREVIOUSLY REPORTED AS Preliminary report    Amphotericin B MIC 1.0 ug/mL  Final    Comment: (NOTE) *Breakpoints have been established for only some organism-drug combinations as indicated. Results of this test are labeled for research purposes only by the assay's manufacturer. The performance characteristics of this assay have not been established by the manufacturer. The result should not be used for treatment or for diagnostic purposes without confirmation of the diagnosis by another medically established diagnostic product or procedure. The performance characteristics were determined by [facility].    Please Note: PENDING  Incomplete   Anidulafungin MIC Comment  Final    Comment: (NOTE) 0.03 ug/mL Susceptible *Breakpoints have been established for only some organism-drug combinations as indicated. Results of this test are labeled for research purposes only by the assay's manufacturer. The performance characteristics of this assay have not been established by the manufacturer. The result should not be used for treatment or for diagnostic purposes without confirmation of the diagnosis by another medically established diagnostic product or procedure. The performance characteristics were determined by [facility].    Caspofungin MIC Comment  Final    Comment: 0.06 ug/mL Susceptible   Micafungin MIC Comment  Final    Comment: 0.016 ug/mL Susceptible   Posaconazole MIC 1.0 ug/mL  Final    Comment: (NOTE) *Breakpoints have been established for only some organism-drug combinations as indicated. Results of this test are labeled for research purposes only by the assay's manufacturer. The performance characteristics of this assay have not been established by the manufacturer. The result  should not  be used for treatment or for diagnostic purposes without confirmation of the diagnosis by another medically established diagnostic product or procedure. The performance characteristics were determined by [facility].    Please Note: PENDING  Incomplete   Fluconazole Islt MIC 16.0 ug/mL  Final    Comment: Susceptible Dose Dependent   Flucytosine MIC 0.06 ug/mL or less  Final   Itraconazole MIC 1.0 ug/mL  Final   Voriconazole MIC 0.5 ug/mL  Final    Comment: (NOTE) Performed At: [facility] [place], [place] [id number] Rush Farmer MD [id number]    Source PENDING  Incomplete  Culture, blood (routine x 2)     Status: Abnormal (Preliminary result)   Collection Time: 05/31/20  9:36 AM   Specimen: BLOOD LEFT HAND  Result Value Ref Range Status   Specimen Description BLOOD LEFT HAND  Final   Special Requests   Final    BOTTLES DRAWN AEROBIC AND ANAEROBIC Blood Culture results may not be optimal due to an inadequate volume of blood received in culture bottles   Culture  Setup Time   Final    YEAST IN BOTH AEROBIC AND ANAEROBIC BOTTLES CRITICAL VALUE NOTED.  VALUE IS CONSISTENT WITH PREVIOUSLY REPORTED AND CALLED VALUE. Performed at [facility], [place] [address], [place], [place] [address]    Culture CANDIDA GLABRATA (A)  Final   Report Status PENDING  Incomplete  MRSA PCR Screening     Status: None   Collection Time: 05/31/20  3:52 PM   Specimen: Nasal Mucosa; Nasopharyngeal  Result Value Ref Range Status   MRSA by PCR NEGATIVE NEGATIVE Final    Comment:        The GeneXpert MRSA Assay (FDA approved for NASAL specimens only), is one component of a comprehensive MRSA colonization surveillance program. It is not intended to diagnose MRSA infection nor to guide or monitor treatment for MRSA infections. Performed at [facility], [place] [address], [place], [place] [address]   Culture, blood (Routine X 2) w Reflex to ID Panel      Status: Abnormal   Collection Time: 06/02/20 10:08 AM   Specimen: BLOOD  Result Value Ref Range Status   Specimen Description BLOOD LEFT ANTECUBITAL  Final   Special Requests   Final    BOTTLES DRAWN AEROBIC ONLY Blood Culture adequate volume   Culture  Setup Time   Final    YEAST AEROBIC BOTTLE ONLY CRITICAL VALUE NOTED.  VALUE IS CONSISTENT WITH PREVIOUSLY REPORTED AND CALLED VALUE. Performed at [facility], [place] [address] [place], [place] [address]    Culture CANDIDA GLABRATA (A)  Final   Report Status 06/07/2020 FINAL  Final  Culture, blood (Routine X 2) w Reflex to ID Panel     Status: Abnormal   Collection Time: 06/02/20 10:08 AM   Specimen: BLOOD LEFT HAND  Result Value Ref Range Status   Specimen Description BLOOD LEFT HAND  Final   Special Requests   Final    BOTTLES DRAWN AEROBIC ONLY Blood Culture adequate volume   Culture  Setup Time   Final    YEAST WITH PSEUDOHYPHAE AEROBIC BOTTLE ONLY CRITICAL RESULT CALLED TO, READ BACK BY AND VERIFIED WITH: J. Roselie Skinner, AT [id number] 06/05/20 Rush Landmark Performed at [facility], [place] [address] [place], [place] [address]    Culture CANDIDA GLABRATA (A)  Final   Report Status 06/07/2020 FINAL  Final  Culture, blood (routine x 2)  Status: Abnormal   Collection Time: 06/04/20  3:19 PM   Specimen: BLOOD  Result Value Ref Range Status   Specimen Description BLOOD LEFT ANTECUBITAL  Final   Special Requests   Final    BOTTLES DRAWN AEROBIC AND ANAEROBIC Blood Culture results may not be optimal due to an inadequate volume of blood received in culture bottles   Culture  Setup Time   Final    GRAM POSITIVE COCCI IN CLUSTERS ANAEROBIC BOTTLE ONLY CRITICAL RESULT CALLED TO, READ BACK BY AND VERIFIED WITH: PHARMD JESSICA C [id number] FCP    Culture (A)  Final    STAPHYLOCOCCUS EPIDERMIDIS THE SIGNIFICANCE OF ISOLATING THIS ORGANISM FROM A SINGLE SET OF BLOOD CULTURES WHEN MULTIPLE SETS ARE DRAWN IS UNCERTAIN. PLEASE  NOTIFY THE MICROBIOLOGY DEPARTMENT WITHIN ONE WEEK IF SPECIATION AND SENSITIVITIES ARE REQUIRED. Performed at [facility], [place] [address] [place], [place] [address]    Report Status 06/07/2020 FINAL  Final  Blood Culture ID Panel (Reflexed)     Status: Abnormal   Collection Time: 06/04/20  3:19 PM  Result Value Ref Range Status   Enterococcus faecalis NOT DETECTED NOT DETECTED Final   Enterococcus Faecium NOT DETECTED NOT DETECTED Final   Listeria monocytogenes NOT DETECTED NOT DETECTED Final   Staphylococcus species DETECTED (A) NOT DETECTED Final    Comment: CRITICAL RESULT CALLED TO, READ BACK BY AND VERIFIED WITH: PHARMD JESSICA C [id number] FCP    Staphylococcus aureus (BCID) NOT DETECTED NOT DETECTED Final   Staphylococcus epidermidis DETECTED (A) NOT DETECTED Final    Comment: Methicillin (oxacillin) resistant coagulase negative staphylococcus. Possible blood culture contaminant (unless isolated from more than one blood culture draw or clinical case suggests pathogenicity). No antibiotic treatment is indicated for blood  culture contaminants. CRITICAL RESULT CALLED TO, READ BACK BY AND VERIFIED WITH: Regina C 2000 [id number] FCP    Staphylococcus lugdunensis NOT DETECTED NOT DETECTED Final   Streptococcus species NOT DETECTED NOT DETECTED Final   Streptococcus agalactiae NOT DETECTED NOT DETECTED Final   Streptococcus pneumoniae NOT DETECTED NOT DETECTED Final   Streptococcus pyogenes NOT DETECTED NOT DETECTED Final   A.calcoaceticus-baumannii NOT DETECTED NOT DETECTED Final   Bacteroides fragilis NOT DETECTED NOT DETECTED Final   Enterobacterales NOT DETECTED NOT DETECTED Final   Enterobacter cloacae complex NOT DETECTED NOT DETECTED Final   Escherichia coli NOT DETECTED NOT DETECTED Final   Klebsiella aerogenes NOT DETECTED NOT DETECTED Final   Klebsiella oxytoca NOT DETECTED NOT DETECTED Final   Klebsiella pneumoniae NOT DETECTED NOT DETECTED Final   Proteus  species NOT DETECTED NOT DETECTED Final   Salmonella species NOT DETECTED NOT DETECTED Final   Serratia marcescens NOT DETECTED NOT DETECTED Final   Haemophilus influenzae NOT DETECTED NOT DETECTED Final   Neisseria meningitidis NOT DETECTED NOT DETECTED Final   Pseudomonas aeruginosa NOT DETECTED NOT DETECTED Final   Stenotrophomonas maltophilia NOT DETECTED NOT DETECTED Final   Candida albicans NOT DETECTED NOT DETECTED Final   Candida auris NOT DETECTED NOT DETECTED Final   Candida glabrata NOT DETECTED NOT DETECTED Final   Candida krusei NOT DETECTED NOT DETECTED Final   Candida parapsilosis NOT DETECTED NOT DETECTED Final   Candida tropicalis NOT DETECTED NOT DETECTED Final   Cryptococcus neoformans/gattii NOT DETECTED NOT DETECTED Final   Methicillin resistance mecA/C DETECTED (A) NOT DETECTED Final    Comment: CRITICAL RESULT CALLED TO, READ BACK BY AND VERIFIED WITH: PHARMD JESSICA C 2000 [id number] FCP Performed at [facility]  [facility], [place] [address], [place], [place] [address]   Culture, blood (routine x 2)     Status: None (Preliminary result)   Collection Time: 06/04/20  3:20 PM   Specimen: BLOOD RIGHT HAND  Result Value Ref Range Status   Specimen Description BLOOD RIGHT HAND  Final   Special Requests   Final    BOTTLES DRAWN AEROBIC ONLY Blood Culture results may not be optimal due to an inadequate volume of blood received in culture bottles   Culture   Final    NO GROWTH 4 DAYS Performed at [facility], [place] [address] [place], [place] [address]    Report Status PENDING  Incomplete    Studies/Results: VAS [place] LOWER EXTREMITY VENOUS (DVT)  Result Date: 06/07/2020  Lower Venous DVT Study Indications: Swelling LT>RT.  Anticoagulation: Recently discontinued eliquis due to active bleed. Comparison Study: No prior studies. Performing Technologist: Darlin Coco RDMS,RVT  Examination Guidelines: A complete evaluation includes B-mode imaging, spectral Doppler, color  Doppler, and power Doppler as needed of all accessible portions of each vessel. Bilateral testing is considered an integral part of a complete examination. Limited examinations for reoccurring indications may be performed as noted. The reflux portion of the exam is performed with the patient in reverse Trendelenburg.  +---------+---------------+---------+-----------+----------+-------------------+ RIGHT    CompressibilityPhasicitySpontaneityPropertiesThrombus Aging      +---------+---------------+---------+-----------+----------+-------------------+ CFV      Partial        Yes      Yes                  Acute               +---------+---------------+---------+-----------+----------+-------------------+ SFJ      Partial        Yes      Yes                  Acute               +---------+---------------+---------+-----------+----------+-------------------+ FV Prox  Partial        Yes      Yes                  Acute               +---------+---------------+---------+-----------+----------+-------------------+ FV Mid   Full           Yes      Yes                                      +---------+---------------+---------+-----------+----------+-------------------+ FV DistalFull                                                             +---------+---------------+---------+-----------+----------+-------------------+ PFV      Partial        Yes      Yes                  Acute               +---------+---------------+---------+-----------+----------+-------------------+ POP      Full           Yes      Yes                                      +---------+---------------+---------+-----------+----------+-------------------+  PTV      Full                                                             +---------+---------------+---------+-----------+----------+-------------------+ PERO                                                  Not well visualized  +---------+---------------+---------+-----------+----------+-------------------+ GSV      Partial        Yes      Yes                  Acute               +---------+---------------+---------+-----------+----------+-------------------+ EIV                     Yes      Yes                                      +---------+---------------+---------+-----------+----------+-------------------+   +---------+---------------+---------+-----------+----------+-------------------+ LEFT     CompressibilityPhasicitySpontaneityPropertiesThrombus Aging      +---------+---------------+---------+-----------+----------+-------------------+ CFV      None           No       No                   Acute               +---------+---------------+---------+-----------+----------+-------------------+ SFJ      None                                         Acute               +---------+---------------+---------+-----------+----------+-------------------+ FV Prox  None           No       No                   Acute               +---------+---------------+---------+-----------+----------+-------------------+ FV Mid                  No       No                   Acute               +---------+---------------+---------+-----------+----------+-------------------+ FV Distal               No       No                   Acute               +---------+---------------+---------+-----------+----------+-------------------+ PFV      None           No       No                   Acute               +---------+---------------+---------+-----------+----------+-------------------+  POP                     No       No                   Acute               +---------+---------------+---------+-----------+----------+-------------------+ PTV      None           No       No                   Acute               +---------+---------------+---------+-----------+----------+-------------------+  PERO                                                  Not well visualized +---------+---------------+---------+-----------+----------+-------------------+ Gastroc  None           No       No                                       +---------+---------------+---------+-----------+----------+-------------------+ EIV      None           No       No                                       +---------+---------------+---------+-----------+----------+-------------------+    Summary: RIGHT: - Findings consistent with acute deep vein thrombosis involving the right common femoral vein, SF junction, right femoral vein, and right proximal profunda vein. - No cystic structure found in the popliteal fossa. - Common femoral vein obstruction doesn't appear to extend above inguinal ligament.  LEFT: - Findings consistent with acute deep vein thrombosis involving the left common femoral vein, SF junction, left femoral vein, left proximal profunda vein, left popliteal vein, left posterior tibial veins, left gastrocnemius veins, and EIV. - No cystic structure found in the popliteal fossa. - Possible obstruction proximal to the inguinal ligament.  *See table(s) above for measurements and observations. Electronically signed by Curt Jews MD on 06/07/2020 at 2:05:21 PM.    Final       Assessment/Plan:  INTERVAL HISTORY:   Continues to be seen by palliative care who have talked to younger son and mother  Active Problems:   Pressure injury of skin   IVH (intraventricular hemorrhage) (HCC)   Altered mental status   Encounter for feeding tube placement   Hypertensive emergency   Atrial flutter ([place])   Fungemia   Metabolic encephalopathy   Severe sepsis without septic shock ([place])   Acute respiratory failure ([place])   Aspiration into airway   Intracranial hemorrhage ([place])   Encounter for imaging study to confirm nasogastric (NG) tube placement   Acute deep vein thrombosis (DVT) of distal vein of right lower  extremity ([place])    Jason [name] is a 70 y.o. male with Struve coronary disease diabetes mellitus paroxysmal atrial fibrillation stroke systolic heart failure who was admitted to the ICU with thalamic hemorrhage with intraventricular extension that required reversal of his anticoagulation intubation and placement of an external ventricular drain.  He was then reintubated but fortunately successfully extubated.  Drain has been out.  He did not appear to have had any central lines.  He became febrile to 102.8 and blood cultures were taken and he was started on vancomycin and Zosyn.  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 I have not seen Candidal meningitis but he did have drain placed (it does not appear overtly infected and MRI brain unremarkable for infection  Certainly appreciate Dr. 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  I recommended moving to pure comfort care.  We will defer decisions about continuing fluconazole to palliative care and primary team.  I spent greater than 35 minutes with the patient including greater than 50% of time in face to face counsel of the patient's son Dominica Severin re his father's prognosis,  and in coordination of his  Care.  We will sign off for now please call with further questions.    LOS: 28 days   Alcide Evener 06/08/2020, 1:03 PM

## 2020-06-08 NOTE — Progress Notes (Signed)
[facility] PROGRESS NOTE    Progress Note  Jason [name]  HAJ:[id number] DOB: 07/27/1950 DOA: 05/11/2020 PCP: Jason Monday, MD     Brief Narrative:   Jason [name] is an 70 y.o. 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  He received Kcentra for reversal of Eliquis was intubated and had a ventricular drain placed on 05/12/2020.  Extubated on 05/16/2020, EVD dislodged on 05/20/2020, was was reintubated on 05/20/2020 eventually extubated on 05/23/2020 and placed on BiPAP.  [facility] where patient has been persistently encephalopathic, palliative care was consulted.  Started developing sepsis physiology on 05/31/2020, after discussion with Perative care CODE STATUS was changed to DNR.  Blood cultures were positive for Candida laparotomy and started on antifungal.  Speech evaluated the patient and recommended a dysphagia 1 diet, his mentation continues to wax and wane  Significant Events: 05/11/2020 admission with a thalamic hemorrhagic infarct and intubated. Extubated on 05/23/2020.  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 05/18/2020 repeated CT of the head without contrast that showed similar size left thalamic intraparenchymal hemorrhage, with an interval placement of a right parietal ventriculostomy catheter with the tip in the right lateral ventricle 05/21/2020 CT of the head that showed interval removal of right frontal ventriculostomy, stable mild ventriculomegaly and ventricular hemorrhage 05/21/2020 CT angio of the head and neck showed negative large vessel occlusion.  Extensive atheromatous plaque. 05/31/2020 CT of the head showed decrease  interventricular hemorrhage with layering within the occipital horns bilaterally.  Antibiotics: 06/05/2020 IV Diflucan  Microbiology data: 05/31/2020 blood culture: Candida glabrata 06/02/2020 blood cultures: Candida glabrata 06/04/2020 blood culture 1 out of 2 positive for staph epidermidis  Procedures: 05/12/2020 EVD placed due to worsening neurological status and high intracranial pressure.  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 Neurosurgery and neurology were consulted had an external ventricular drain placed from 2/25 2 05/20/2020. EEG was noted to have generalized slowing severe diffuse encephalopathy he was started on Keppra for seizure prophylaxis. Currently with on tube feeds, on 3/18 had mild improvement in his mental status and diet was upgraded to a dysphagia 1 diet. There is no plan for a PEG tube. Speech evaluated him they recommended a dysphagia 1 diet he is currently aspirating. I spoken to the son this morning of told him about his extremely poor prognosis now that he is aspirating, with fungemia and new bilateral extensive lower extremity DVT persistent atrial fibrillation, how he we have run out of options and treatment and he will probably pass away over the next several weeks. He appreciated our conversation and he related he was going to talk to the family and get back to [place].  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 MRI of the brain without leptomeningeal enhancement, probable source decubitus wounds. ID recommended ophthalmology evaluation which was discussed with Jason [name] by the previous physician. Repeated blood cultures on 06/02/2020 was positive for Candida. Fevers are  improving. Ophthalmology evaluation showed no signs of fungal endophthalmitis.  Intraretinal hemorrhage of the right  eye small likely due to diabetic retinopathy, with nonproliferative diabetic retinopathy.  Acute respiratory failure with hypoxia: Secondary to hydrocephalus and intracranial hemorrhage.  Intubated eventually extubated on 06/02/2020. Continues to be intermittent tachypneic seen by PCCM multiple times.  Persistent A. fib/a flutter: Eliquis has been discontinued due to intracranial hemorrhage. With a heart rate overnight into the 130s currently on on oral Cardizem and oral metoprolol. We will go ahead and start on metoprolol IV as needed for heart rate greater than 100.  Chronic systolic heart failure: With an EF of 45% does not appear to be fluid overloaded on physical exam.  Currently on free water per tube. Hold Lasix for today he is negative about 5 L continue to monitor strict I's and O's.  Hypovolemic hypernatremia: He is currently on IV Lasix and free water per tube, will hold Lasix continue free water flushes. Recheck a basic metabolic panel tomorrow morning.  Hypertensive emergency/uncontrolled hypertension: Now normotensive on Cardizem and metoprolol.  History of CAD: Stable continue cardiac meds.  Diabetes mellitus type 2 uncontrolled with an A1c of 9.4: Continue Lantus plus sliding scale.  Blood glucose fairly controlled.  Thrombocytopenia: Likely secondary to sepsis currently on antifungal. Lovenox discontinued for 5 days ago. HIT panel pending after discontinuation of Lovenox, his platelets are trending up.  New DVT B/L: Lower extremity Doppler was positive bilateral lower extremity DVT extensive. At this point in time we cannot place a filter due to his fungemia, he cannot be placed on anticoagulation due to severe thrombocytopenia and his recent left thalamic thalamic hemorrhagic stroke. Unfortunately at this point in time there is no further viable  option for this patient except for comfort care. I am going to recommend to the family to move towards comfort care and it is futile to continue to treat Jason [name] as we have no viable options for his newly diagnosed DVT.  He has had minimal improvement in his cognition.  Goals of care: Patient made a DNR, he has a very poor prognosis.   No good options at this point in time with newly diagnosed DVT New thalamic hemorrhagic stroke, Candida glabrata fungemia and aspiration pneumonia.  I recommend moving towards comfort care. Palliative care is working with the family but the family does not seem to understand or grasp the gravity of the situation.  I was unsuccessful try to contact Dominica Severin.  Stage II sacral decubitus ulcer: RN Pressure Injury Documentation: Pressure Injury 05/24/20 Buttocks Bilateral;Medial Deep Tissue Pressure Injury - Purple or maroon localized area of discolored intact skin or blood-filled blister due to damage of underlying soft tissue from pressure and/or shear. Multiple blisters along (Active)  05/24/20 0800  Location: Buttocks  Location Orientation: Bilateral;Medial  Staging: Deep Tissue Pressure Injury - Purple or maroon localized area of discolored intact skin or blood-filled blister due to damage of underlying soft tissue from pressure and/or shear.  Wound Description (Comments): Multiple blisters along with deep tissue injury.  Present on Admission: No     DVT prophylaxis: SCD Family Communication:Son Status is: Inpatient  Remains inpatient appropriate because:Hemodynamically unstable   Dispo: The patient is from: Home              Anticipated d/c is to: SNF              Patient currently is not medically stable to d/c.   Difficult to place patient No        Code Status:     Code Status Orders  (From  admission, onward)         Start     Ordered   06/01/20 1535  Do not attempt resuscitation (DNR)  Continuous       Question Answer Comment  In the  event of cardiac or respiratory ARREST Do not call a "code blue"   In the event of cardiac or respiratory ARREST Do not perform Intubation, CPR, defibrillation or ACLS   In the event of cardiac or respiratory ARREST Use medication by any route, position, wound care, and other measures to relive pain and suffering. May use oxygen, suction and manual treatment of airway obstruction as needed for comfort.      06/01/20 1534        Code Status History    Date Active Date Inactive Code Status Order ID Comments User Context   05/11/2020 2334 06/01/2020 1534 Full Code [id number]  Lorenza Chick, MD Inpatient   02/26/2020 1753 03/09/2020 0325 Full Code [id number]  Marianna Payment, MD ED   Advance Care Planning Activity        IV Access:    Peripheral IV   Procedures and diagnostic studies:   VAS [place] LOWER EXTREMITY VENOUS (DVT)  Result Date: 06/07/2020  Lower Venous DVT Study Indications: Swelling LT>RT.  Anticoagulation: Recently discontinued eliquis due to active bleed. Comparison Study: No prior studies. Performing Technologist: Darlin Coco RDMS,RVT  Examination Guidelines: A complete evaluation includes B-mode imaging, spectral Doppler, color Doppler, and power Doppler as needed of all accessible portions of each vessel. Bilateral testing is considered an integral part of a complete examination. Limited examinations for reoccurring indications may be performed as noted. The reflux portion of the exam is performed with the patient in reverse Trendelenburg.  +---------+---------------+---------+-----------+----------+-------------------+ RIGHT    CompressibilityPhasicitySpontaneityPropertiesThrombus Aging      +---------+---------------+---------+-----------+----------+-------------------+ CFV      Partial        Yes      Yes                  Acute               +---------+---------------+---------+-----------+----------+-------------------+ SFJ      Partial        Yes      Yes                   Acute               +---------+---------------+---------+-----------+----------+-------------------+ FV Prox  Partial        Yes      Yes                  Acute               +---------+---------------+---------+-----------+----------+-------------------+ FV Mid   Full           Yes      Yes                                      +---------+---------------+---------+-----------+----------+-------------------+ FV DistalFull                                                             +---------+---------------+---------+-----------+----------+-------------------+ PFV      Partial  Yes      Yes                  Acute               +---------+---------------+---------+-----------+----------+-------------------+ POP      Full           Yes      Yes                                      +---------+---------------+---------+-----------+----------+-------------------+ PTV      Full                                                             +---------+---------------+---------+-----------+----------+-------------------+ PERO                                                  Not well visualized +---------+---------------+---------+-----------+----------+-------------------+ GSV      Partial        Yes      Yes                  Acute               +---------+---------------+---------+-----------+----------+-------------------+ EIV                     Yes      Yes                                      +---------+---------------+---------+-----------+----------+-------------------+   +---------+---------------+---------+-----------+----------+-------------------+ LEFT     CompressibilityPhasicitySpontaneityPropertiesThrombus Aging      +---------+---------------+---------+-----------+----------+-------------------+ CFV      None           No       No                   Acute                +---------+---------------+---------+-----------+----------+-------------------+ SFJ      None                                         Acute               +---------+---------------+---------+-----------+----------+-------------------+ FV Prox  None           No       No                   Acute               +---------+---------------+---------+-----------+----------+-------------------+ FV Mid                  No       No                   Acute               +---------+---------------+---------+-----------+----------+-------------------+  FV Distal               No       No                   Acute               +---------+---------------+---------+-----------+----------+-------------------+ PFV      None           No       No                   Acute               +---------+---------------+---------+-----------+----------+-------------------+ POP                     No       No                   Acute               +---------+---------------+---------+-----------+----------+-------------------+ PTV      None           No       No                   Acute               +---------+---------------+---------+-----------+----------+-------------------+ PERO                                                  Not well visualized +---------+---------------+---------+-----------+----------+-------------------+ Gastroc  None           No       No                                       +---------+---------------+---------+-----------+----------+-------------------+ EIV      None           No       No                                       +---------+---------------+---------+-----------+----------+-------------------+    Summary: RIGHT: - Findings consistent with acute deep vein thrombosis involving the right common femoral vein, SF junction, right femoral vein, and right proximal profunda vein. - No cystic structure found in the popliteal fossa. - Common  femoral vein obstruction doesn't appear to extend above inguinal ligament.  LEFT: - Findings consistent with acute deep vein thrombosis involving the left common femoral vein, SF junction, left femoral vein, left proximal profunda vein, left popliteal vein, left posterior tibial veins, left gastrocnemius veins, and EIV. - No cystic structure found in the popliteal fossa. - Possible obstruction proximal to the inguinal ligament.  *See table(s) above for measurements and observations. Electronically signed by Curt Jews MD on 06/07/2020 at 2:05:21 PM.    Final      Medical Consultants:    None.   Subjective:    Jason [name] nonverbal  Objective:    Vitals:   06/07/20 2002 06/07/20 2307 06/08/20 0315 06/08/20 0806  BP: (!) 142/80 101/63 124/63 117/67  Pulse: (!) 103 70 76 (!) 102  Resp: (!) 41 Marland Kitchen)  42 (!) 40 (!) 37  Temp: 98.5 F (36.9 C) 99 F (37.2 C) 98.9 F (37.2 C) 98.6 F (37 C)  TempSrc: Oral Oral Oral Axillary  SpO2: 99% 97% 98% 95%  Weight:      Height:       SpO2: 95 % O2 Flow Rate (L/min): 2 L/min FiO2 (%): 30 %   Intake/Output Summary (Last 24 hours) at 06/08/2020 0915 Last data filed at 06/08/2020 0809 Gross per 24 hour  Intake 220 ml  Output 2250 ml  Net -2030 ml   Filed Weights   06/03/20 0500 06/04/20 0500 06/07/20 0500  Weight: 102.5 kg 100.9 kg 103.2 kg    Exam: General exam: In no acute distress. Respiratory system: Good air movement and clear to auscultation. Cardiovascular system: S1 & S2 heard, RRR. No JVD. Gastrointestinal system: Abdomen is nondistended, soft and nontender.  Extremities: No pedal edema. Skin: No rashes, lesions or ulcers  Data Reviewed:    Labs: Basic Metabolic Panel: Recent Labs  Lab 06/03/20 0117 06/04/20 0326 06/05/20 0433 06/06/20 0435 06/07/20 0419  NA 146* 146* 146* 149* 150*  K 3.8 3.5 3.5 3.0* 3.0*  CL 116* 114* 116* 119* 121*  CO2 23 21* 23 21* 22  GLUCOSE 224* 154* 254* 199* 210*  BUN 56* 51* 53* 53*  58*  CREATININE 2.22* 1.85* 1.79* 1.79* 1.89*  CALCIUM 7.7* 7.9* 8.0* 8.0* 8.2*   GFR Estimated Creatinine Clearance: 43.2 mL/min (A) (by C-G formula based on SCr of 1.89 mg/dL (H)). Liver Function Tests: Recent Labs  Lab 06/05/20 0433  AST 69*  ALT 127*  ALKPHOS 385*  BILITOT 0.7  PROT 5.8*  ALBUMIN 1.7*   No results for input(s): LIPASE, AMYLASE in the last 168 hours. No results for input(s): AMMONIA in the last 168 hours. Coagulation profile No results for input(s): INR, PROTIME in the last 168 hours. COVID-19 Labs  No results for input(s): DDIMER, FERRITIN, LDH, CRP in the last 72 hours.  Lab Results  Component Value Date   SARSCOV2NAA NEGATIVE 05/11/2020   SARSCOV2NAA NEGATIVE 03/08/2020   [place] NEGATIVE 02/26/2020    CBC: Recent Labs  Lab 06/03/20 0117 06/04/20 0326 06/05/20 0433 06/06/20 0435 06/07/20 0419  WBC 6.4 7.7 7.1 9.1 10.1  HGB 12.3* 12.7* 14.7 12.8* 12.8*  HCT 39.2 39.6 44.7 39.5 40.1  MCV 88.7 86.7 85.6 85.9 88.3  PLT 79* 58* 43* 65* 82*   Cardiac Enzymes: No results for input(s): CKTOTAL, CKMB, CKMBINDEX, TROPONINI in the last 168 hours. BNP (last 3 results) No results for input(s): PROBNP in the last 8760 hours. CBG: Recent Labs  Lab 06/07/20 1606 06/07/20 2005 06/07/20 2308 06/08/20 0318 06/08/20 0812  GLUCAP 241* 193* 210* 192* 193*   D-Dimer: No results for input(s): DDIMER in the last 72 hours. Hgb A1c: No results for input(s): HGBA1C in the last 72 hours. Lipid Profile: No results for input(s): CHOL, HDL, LDLCALC, TRIG, CHOLHDL, LDLDIRECT in the last 72 hours. Thyroid function studies: No results for input(s): TSH, T4TOTAL, T3FREE, THYROIDAB in the last 72 hours.  Invalid input(s): FREET3 Anemia work up: No results for input(s): VITAMINB12, FOLATE, FERRITIN, TIBC, IRON, RETICCTPCT in the last 72 hours. Sepsis Labs: Recent Labs  Lab 06/02/20 0404 06/03/20 0117 06/04/20 0326 06/05/20 0433 06/06/20 0435  06/07/20 0419  PROCALCITON 2.52  --   --   --   --   --   WBC 4.9   < > 7.7 7.1 9.1 10.1   < > =  values in this interval not displayed.   Microbiology Recent Results (from the past 240 hour(s))  Culture, blood (routine x 2)     Status: Abnormal (Preliminary result)   Collection Time: 05/31/20  9:28 AM   Specimen: BLOOD  Result Value Ref Range Status   Specimen Description BLOOD LEFT ANTECUBITAL  Final   Special Requests   Final    BOTTLES DRAWN AEROBIC AND ANAEROBIC Blood Culture results may not be optimal due to an inadequate volume of blood received in culture bottles   Culture  Setup Time (A)  Final    YEAST IN BOTH AEROBIC AND ANAEROBIC BOTTLES CRITICAL RESULT CALLED TO, READ BACK BY AND VERIFIED WITH: Hoagland J. 1326 [id number] FCP    Culture (A)  Final    CANDIDA GLABRATA Sent to [facility] for further susceptibility testing. Performed at [facility], [place] [address], [place], [place] [address]    Report Status PENDING  Incomplete  Blood Culture ID Panel (Reflexed)     Status: Abnormal   Collection Time: 05/31/20  9:28 AM  Result Value Ref Range Status   Enterococcus faecalis NOT DETECTED NOT DETECTED Final   Enterococcus Faecium NOT DETECTED NOT DETECTED Final   Listeria monocytogenes NOT DETECTED NOT DETECTED Final   Staphylococcus species NOT DETECTED NOT DETECTED Final   Staphylococcus aureus (BCID) NOT DETECTED NOT DETECTED Final   Staphylococcus epidermidis NOT DETECTED NOT DETECTED Final   Staphylococcus lugdunensis NOT DETECTED NOT DETECTED Final   Streptococcus species NOT DETECTED NOT DETECTED Final   Streptococcus agalactiae NOT DETECTED NOT DETECTED Final   Streptococcus pneumoniae NOT DETECTED NOT DETECTED Final   Streptococcus pyogenes NOT DETECTED NOT DETECTED Final   A.calcoaceticus-baumannii NOT DETECTED NOT DETECTED Final   Bacteroides fragilis NOT DETECTED NOT DETECTED Final   Enterobacterales NOT DETECTED NOT DETECTED Final   Enterobacter  cloacae complex NOT DETECTED NOT DETECTED Final   Escherichia coli NOT DETECTED NOT DETECTED Final   Klebsiella aerogenes NOT DETECTED NOT DETECTED Final   Klebsiella oxytoca NOT DETECTED NOT DETECTED Final   Klebsiella pneumoniae NOT DETECTED NOT DETECTED Final   Proteus species NOT DETECTED NOT DETECTED Final   Salmonella species NOT DETECTED NOT DETECTED Final   Serratia marcescens NOT DETECTED NOT DETECTED Final   Haemophilus influenzae NOT DETECTED NOT DETECTED Final   Neisseria meningitidis NOT DETECTED NOT DETECTED Final   Pseudomonas aeruginosa NOT DETECTED NOT DETECTED Final   Stenotrophomonas maltophilia NOT DETECTED NOT DETECTED Final   Candida albicans NOT DETECTED NOT DETECTED Final   Candida auris NOT DETECTED NOT DETECTED Final   Candida glabrata DETECTED (A) NOT DETECTED Final    Comment: CRITICAL RESULT CALLED TO, READ BACK BY AND VERIFIED WITH: PHARMD THOMAS J. 1326 [id number] FCP    Candida krusei NOT DETECTED NOT DETECTED Final   Candida parapsilosis NOT DETECTED NOT DETECTED Final   Candida tropicalis NOT DETECTED NOT DETECTED Final   Cryptococcus neoformans/gattii NOT DETECTED NOT DETECTED Final    Comment: Performed at [facility], [place] [address], [place], [place] [address]  Antifungal AST 9 Drug Panel     Status: None   Collection Time: 05/31/20  9:28 AM  Result Value Ref Range Status   Organism ID, Yeast Candida glabrata  Corrected    Comment: (NOTE) Identification performed by account, not confirmed by this laboratory. CORRECTED ON 03/22 AT 1036: PREVIOUSLY REPORTED AS Preliminary report    Amphotericin B MIC 1.0 ug/mL  Final    Comment: (  NOTE) *Breakpoints have been established for only some organism-drug combinations as indicated. Results of this test are labeled for research purposes only by the assay's manufacturer. The performance characteristics of this assay have not been established by the manufacturer. The result should not be used for  treatment or for diagnostic purposes without confirmation of the diagnosis by another medically established diagnostic product or procedure. The performance characteristics were determined by [facility].    Please Note: PENDING  Incomplete   Anidulafungin MIC Comment  Final    Comment: (NOTE) 0.03 ug/mL Susceptible *Breakpoints have been established for only some organism-drug combinations as indicated. Results of this test are labeled for research purposes only by the assay's manufacturer. The performance characteristics of this assay have not been established by the manufacturer. The result should not be used for treatment or for diagnostic purposes without confirmation of the diagnosis by another medically established diagnostic product or procedure. The performance characteristics were determined by [facility].    Caspofungin MIC Comment  Final    Comment: 0.06 ug/mL Susceptible   Micafungin MIC Comment  Final    Comment: 0.016 ug/mL Susceptible   Posaconazole MIC 1.0 ug/mL  Final    Comment: (NOTE) *Breakpoints have been established for only some organism-drug combinations as indicated. Results of this test are labeled for research purposes only by the assay's manufacturer. The performance characteristics of this assay have not been established by the manufacturer. The result should not be used for treatment or for diagnostic purposes without confirmation of the diagnosis by another medically established diagnostic product or procedure. The performance characteristics were determined by [facility].    Please Note: PENDING  Incomplete   Fluconazole Islt MIC 16.0 ug/mL  Final    Comment: Susceptible Dose Dependent   Flucytosine MIC 0.06 ug/mL or less  Final   Itraconazole MIC 1.0 ug/mL  Final   Voriconazole MIC 0.5 ug/mL  Final    Comment: (NOTE) Performed At: [facility] [place] [address] [place], [place] [id number] Jolene Schimke MD PN:[id number]    Source PENDING   Incomplete  Culture, blood (routine x 2)     Status: Abnormal (Preliminary result)   Collection Time: 05/31/20  9:36 AM   Specimen: BLOOD LEFT HAND  Result Value Ref Range Status   Specimen Description BLOOD LEFT HAND  Final   Special Requests   Final    BOTTLES DRAWN AEROBIC AND ANAEROBIC Blood Culture results may not be optimal due to an inadequate volume of blood received in culture bottles   Culture  Setup Time   Final    YEAST IN BOTH AEROBIC AND ANAEROBIC BOTTLES CRITICAL VALUE NOTED.  VALUE IS CONSISTENT WITH PREVIOUSLY REPORTED AND CALLED VALUE. Performed at [facility], [address], [place], [place] [address]    Culture CANDIDA GLABRATA (A)  Final   Report Status PENDING  Incomplete  MRSA PCR Screening     Status: None   Collection Time: 05/31/20  3:52 PM   Specimen: Nasal Mucosa; Nasopharyngeal  Result Value Ref Range Status   MRSA by PCR NEGATIVE NEGATIVE Final    Comment:        The GeneXpert MRSA Assay (FDA approved for NASAL specimens only), is one component of a comprehensive MRSA colonization surveillance program. It is not intended to diagnose MRSA infection nor to guide or monitor treatment for MRSA infections. Performed at [facility], [address], [place], [place] [address]   Culture, blood (Routine X 2) w Reflex  to ID Panel     Status: Abnormal   Collection Time: 06/02/20 10:08 AM   Specimen: BLOOD  Result Value Ref Range Status   Specimen Description BLOOD LEFT ANTECUBITAL  Final   Special Requests   Final    BOTTLES DRAWN AEROBIC ONLY Blood Culture adequate volume   Culture  Setup Time   Final    YEAST AEROBIC BOTTLE ONLY CRITICAL VALUE NOTED.  VALUE IS CONSISTENT WITH PREVIOUSLY REPORTED AND CALLED VALUE. Performed at [facility], [place] [address], [place], [place] [address]    Culture CANDIDA GLABRATA (A)  Final   Report Status 06/07/2020 FINAL  Final  Culture, blood (Routine X 2) w Reflex to ID Panel     Status:  Abnormal   Collection Time: 06/02/20 10:08 AM   Specimen: BLOOD LEFT HAND  Result Value Ref Range Status   Specimen Description BLOOD LEFT HAND  Final   Special Requests   Final    BOTTLES DRAWN AEROBIC ONLY Blood Culture adequate volume   Culture  Setup Time   Final    YEAST WITH PSEUDOHYPHAE AEROBIC BOTTLE ONLY CRITICAL RESULT CALLED TO, READ BACK BY AND VERIFIED WITH: J. Roselie Skinner, AT 4742 06/05/20 [place] Performed at [facility], [place] [address] [place], [place] [address]    Culture CANDIDA GLABRATA (A)  Final   Report Status 06/07/2020 FINAL  Final  Culture, blood (routine x 2)     Status: Abnormal   Collection Time: 06/04/20  3:19 PM   Specimen: BLOOD  Result Value Ref Range Status   Specimen Description BLOOD LEFT ANTECUBITAL  Final   Special Requests   Final    BOTTLES DRAWN AEROBIC AND ANAEROBIC Blood Culture results may not be optimal due to an inadequate volume of blood received in culture bottles   Culture  Setup Time   Final    GRAM POSITIVE COCCI IN CLUSTERS ANAEROBIC BOTTLE ONLY CRITICAL RESULT CALLED TO, READ BACK BY AND VERIFIED WITH: PHARMD [place] C 2000 [id number] FCP    Culture (A)  Final    STAPHYLOCOCCUS EPIDERMIDIS THE SIGNIFICANCE OF ISOLATING THIS ORGANISM FROM A SINGLE SET OF BLOOD CULTURES WHEN MULTIPLE SETS ARE DRAWN IS UNCERTAIN. PLEASE NOTIFY THE MICROBIOLOGY DEPARTMENT WITHIN ONE WEEK IF SPECIATION AND SENSITIVITIES ARE REQUIRED. Performed at [facility], [place] [address] [place], [place] [address]    Report Status 06/07/2020 FINAL  Final  Blood Culture ID Panel (Reflexed)     Status: Abnormal   Collection Time: 06/04/20  3:19 PM  Result Value Ref Range Status   Enterococcus faecalis NOT DETECTED NOT DETECTED Final   Enterococcus Faecium NOT DETECTED NOT DETECTED Final   Listeria monocytogenes NOT DETECTED NOT DETECTED Final   Staphylococcus species DETECTED (A) NOT DETECTED Final    Comment: CRITICAL RESULT CALLED TO, READ BACK  BY AND VERIFIED WITH: PHARMD JESSICA C 2000 [id number] FCP    Staphylococcus aureus (BCID) NOT DETECTED NOT DETECTED Final   Staphylococcus epidermidis DETECTED (A) NOT DETECTED Final    Comment: Methicillin (oxacillin) resistant coagulase negative staphylococcus. Possible blood culture contaminant (unless isolated from more than one blood culture draw or clinical case suggests pathogenicity). No antibiotic treatment is indicated for blood  culture contaminants. CRITICAL RESULT CALLED TO, READ BACK BY AND VERIFIED WITH: PHARMD JESSICA C 2000 [id number] FCP    Staphylococcus lugdunensis NOT DETECTED NOT DETECTED Final   Streptococcus species NOT DETECTED NOT DETECTED Final   Streptococcus agalactiae NOT DETECTED  NOT DETECTED Final   Streptococcus pneumoniae NOT DETECTED NOT DETECTED Final   Streptococcus pyogenes NOT DETECTED NOT DETECTED Final   A.calcoaceticus-baumannii NOT DETECTED NOT DETECTED Final   Bacteroides fragilis NOT DETECTED NOT DETECTED Final   Enterobacterales NOT DETECTED NOT DETECTED Final   Enterobacter cloacae complex NOT DETECTED NOT DETECTED Final   Escherichia coli NOT DETECTED NOT DETECTED Final   Klebsiella aerogenes NOT DETECTED NOT DETECTED Final   Klebsiella oxytoca NOT DETECTED NOT DETECTED Final   Klebsiella pneumoniae NOT DETECTED NOT DETECTED Final   Proteus species NOT DETECTED NOT DETECTED Final   Salmonella species NOT DETECTED NOT DETECTED Final   Serratia marcescens NOT DETECTED NOT DETECTED Final   Haemophilus influenzae NOT DETECTED NOT DETECTED Final   Neisseria meningitidis NOT DETECTED NOT DETECTED Final   Pseudomonas aeruginosa NOT DETECTED NOT DETECTED Final   Stenotrophomonas maltophilia NOT DETECTED NOT DETECTED Final   Candida albicans NOT DETECTED NOT DETECTED Final   Candida auris NOT DETECTED NOT DETECTED Final   Candida glabrata NOT DETECTED NOT DETECTED Final   Candida krusei NOT DETECTED NOT DETECTED Final   Candida parapsilosis NOT  DETECTED NOT DETECTED Final   Candida tropicalis NOT DETECTED NOT DETECTED Final   Cryptococcus neoformans/gattii NOT DETECTED NOT DETECTED Final   Methicillin resistance mecA/C DETECTED (A) NOT DETECTED Final    Comment: CRITICAL RESULT CALLED TO, READ BACK BY AND VERIFIED WITH: PHARMD JESSICA C 2000 [id number] FCP Performed at [facility], [address], [place], [place] [address]   Culture, blood (routine x 2)     Status: None (Preliminary result)   Collection Time: 06/04/20  3:20 PM   Specimen: BLOOD RIGHT HAND  Result Value Ref Range Status   Specimen Description BLOOD RIGHT HAND  Final   Special Requests   Final    BOTTLES DRAWN AEROBIC ONLY Blood Culture results may not be optimal due to an inadequate volume of blood received in culture bottles   Culture   Final    NO GROWTH 4 DAYS Performed at [facility], [place] [address] [place], [place] [address]    Report Status PENDING  Incomplete     Medications:   . chlorhexidine gluconate (MEDLINE KIT)  15 mL Mouth Rinse BID  . diltiazem  60 mg Oral Q8H  . fluconazole  800 mg Oral Daily  . insulin aspart  0-20 Units Subcutaneous Q4H  . levETIRAcetam  750 mg Per Tube BID  . mouth rinse  15 mL Mouth Rinse q12n4p  . metoprolol tartrate  50 mg Per Tube BID  . pantoprazole sodium  40 mg Per Tube Daily   Continuous Infusions: . sodium chloride        LOS: 28 days   Charlynne Cousins  [facility]  06/08/2020, 9:15 AM

## 2020-06-08 NOTE — Progress Notes (Signed)
Daily Progress Note   Patient Name: Jason [name]       Date: 06/08/2020 DOB: [birth year]  Age: 70 y.o. MRN#: [id number] Attending Physician: Jason Cousins, MD Primary Care Physician: Jason Marble, MD Admit Date: 05/11/2020  Reason for Consultation/Follow-up: Establishing goals of care  Subjective: Patient is lethargic. Does not open eyes or respond to my voice or touch.  Noted cortrack discontinued. He is not eating enough to sustain life.  He has developed significant DVT's that cannot be treated due to his bleeding issues. He is tachypneic- this is likely neurological breathing. He appears to be dying.   I spoke with his son Jason [name]. Jason [name] agrees to transition to full comfort measures only. He would like to try and transfer patient to [place].   Review of Systems  Unable to perform ROS: Acuity of condition    Length of Stay: 28  Current Medications: Scheduled Meds:  . chlorhexidine gluconate (MEDLINE KIT)  15 mL Mouth Rinse BID  . diltiazem  60 mg Oral Q8H  . [START ON 06/09/2020] fluconazole  800 mg Per Tube Daily  . insulin aspart  0-20 Units Subcutaneous Q4H  . levETIRAcetam  750 mg Per Tube BID  . mouth rinse  15 mL Mouth Rinse q12n4p  . metoprolol tartrate  50 mg Per Tube BID  . pantoprazole sodium  40 mg Per Tube Daily    Continuous Infusions: . sodium chloride    . dextrose 75 mL/hr at 06/08/20 1244    PRN Meds: acetaminophen **OR** acetaminophen (TYLENOL) oral liquid 160 mg/5 mL **OR** acetaminophen, Gerhardt's butt cream, metoprolol tartrate, morphine injection, ondansetron (ZOFRAN) IV, polyethylene glycol, Resource ThickenUp Clear  Physical Exam Vitals and nursing note reviewed.  Pulmonary:     Comments: Increased RR Neurological:      Comments: unresponsive             Vital Signs: BP 104/60 (BP Location: Left Arm)   Pulse 71   Temp (!) 100.8 F (38.2 C) (Axillary)   Resp (!) 36   Ht $R'5\' 10"'CL$  (1.778 m)   Wt 103.2 kg   SpO2 97%   BMI 32.65 kg/m  SpO2: SpO2: 97 % O2 Device: O2 Device: Nasal Cannula O2 Flow Rate: O2 Flow Rate (L/min): 2 L/min  Intake/output summary:   Intake/Output  Summary (Last 24 hours) at 06/08/2020 1601 Last data filed at 06/08/2020 1409 Gross per 24 hour  Intake 100 ml  Output 1675 ml  Net -1575 ml   LBM: Last BM Date: 06/07/20 Baseline Weight: Weight: 100 kg Most recent weight: Weight: 103.2 kg       Palliative Assessment/Data: PPS: 10%    Flowsheet Rows   Flowsheet Row Most Recent Value  Intake Tab   Referral Department Neurology  Unit at Time of Referral ICU  Palliative Care Primary Diagnosis Neurology  Date Notified 05/27/20  Palliative Care Type New Palliative care  Reason for referral Clarify Goals of Care  Date of Admission 05/11/20  Date first seen by Palliative Care 05/28/20  # of days Palliative referral response time 1 Day(s)  # of days IP prior to Palliative referral 16  Clinical Assessment   Psychosocial & Spiritual Assessment   Palliative Care Outcomes       Patient Active Problem List   Diagnosis Date Noted  . DVT (deep vein thrombosis) in pregnancy   . DVT, lower extremity, distal, acute, bilateral ([place])   . Encounter for imaging study to confirm nasogastric (NG) tube placement   . Acute deep vein thrombosis (DVT) of distal vein of right lower extremity ([place])   . Acute respiratory failure ([place])   . Aspiration into airway   . Intracranial hemorrhage ([place])   . Fungemia   . Metabolic encephalopathy   . Severe sepsis without septic shock ([place])   . Atrial flutter ([place])   . Altered mental status   . Goals of care, counseling/discussion   . Hypertensive emergency   . IVH (intraventricular hemorrhage) ([place]) 05/11/2020  . Pressure injury of skin  03/02/2020  . DKA, type 2 ([place]) 02/27/2020  . Paroxysmal A-fib ([place]) 02/27/2020  . Elevated troponin 02/27/2020  . CAD (coronary artery disease), native coronary artery 02/27/2020  . Hyperosmolar hyperglycemic state (HHS) ([place]) 02/26/2020  . Essential hypertension 10/29/2019  . Type 2 diabetes mellitus ([place]) 10/29/2019    Palliative Care Assessment & Plan   Patient Profile: 70 y.o.malewith past medical history of DM, previous CVA, CAD/MI/CABG/Afib on anticoagulation, anxiety and depressionwho was admitted on 2/24/2022with altered mental status and hypertensive emergency. 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 He is currently in a floor bed. His albumin is 2.1. 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 He has developed fungemia likely source is decubitus ulcer. He also has bilateral DVT's- cannot anticoagulate due to medical status.  Palliative medicine has been consulting for goals of care.      Assessment/Recommendations/Plan  Transition to full comfort measures only- d/c IV fluids, labs, medications that do not interfere with comfort Comfort medications as ordered- will scheduled opioid for pain and respiratory status TOC referral to assist with inpatient hospice in [place] and Additional Recommendations: Limitations on Scope of Treatment: Full Comfort Care  Code Status: DNR  Prognosis:  < 2 weeks  Discharge Planning: Hospice facility  Care plan was discussed with patient's son- Jason [name].  Thank you for allowing the Palliative Medicine Team to assist in the care of this patient.   Total time: 62 minutes Greater than 50%  of this time was spent counseling and coordinating care related to the above assessment and  plan.  Jason [name], AGNP-C Palliative Medicine  Please contact Palliative Medicine Team phone at [phone] for questions and concerns.

## 2020-06-09 LAB — CULTURE, BLOOD (ROUTINE X 2): Culture: NO GROWTH

## 2020-06-09 NOTE — Progress Notes (Addendum)
   Palliative Medicine Inpatient Follow Up Note  HPI: 70 y.o.malewith past medical history of DM, previous CVA, CAD/MI/CABG/Afib on anticoagulation, anxiety and depressionwho was admitted on 2/24/2022with altered mental status and hypertensive emergency. 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 He is currently in a floor bed. His albumin is 2.1. 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 He has developed fungemia likely source is decubitus ulcer. He also has bilateral DVT's- cannot anticoagulate due to medical status.  Palliative medicine has been consulting for goals of care.   Today's Discussion (06/09/2020):  *Please note that this is a verbal dictation therefore any spelling or grammatical errors are due to the "Dragon Medical One" system interpretation.  I was able to speak to Damaree's bedside RN, Danne Harbor who shares that Natasha has had well controlled symptoms throughout the day on around the clock medications.   Kevis appeared comfortable while I was at bedside, he would incrementally open his eyes though did not response to me.   Reviewed the plan to transition Sahir to [facility] over the weekend.   Questions and concerns addressed   Objective Assessment: Vital Signs Vitals:   06/08/20 2055 06/09/20 0805  BP: 136/84 130/74  Pulse: 90 (!) 143  Resp: (!) 30 (!) 24  Temp:  98.7 F (37.1 C)  SpO2: 97% 98%    Intake/Output Summary (Last 24 hours) at 06/09/2020 4010 Last data filed at 06/09/2020 0600 Gross per 24 hour  Intake 105 ml  Output 400 ml  Net -295 ml   Last Weight  Most recent update: 06/07/2020  6:57 AM   Weight  103.2 kg (227 lb 8.2 oz)           Gen:  Ill appearing elderly M HEENT: Dry mucous membranes CV:  Regular rate and rhythm  PULM: On RA, even nonlabored ABD: soft/nontender  EXT: (+) Generalized edema Neuro: Lethargic  SUMMARY OF RECOMMENDATIONS   DNAR/DNI  Comfort focused Care - Continue scheduled medications  TOC referral to assist with inpatient hospice in [facility]  Ongoing Palliative care support  Time Spent: 15 Greater than 50% of the time was spent in counseling and coordination of care ______________________________________________________________________________________ Lamarr Lulas [place] Palliative Medicine Team Team Cell Phone: [phone] Please utilize secure chat with additional questions, if there is no response within 30 minutes please call the above phone number  Palliative Medicine Team providers are available by phone from 7am to 7pm daily and can be reached through the team cell phone.  Should this patient require assistance outside of these hours, please call the patient's attending physician.

## 2020-06-09 NOTE — Progress Notes (Signed)
Nutrition Brief Note  Chart reviewed. Pt now transitioning to comfort care.  No further nutrition interventions warranted at this time.  Please re-consult as needed.   Amanda Averett, MS, RD, LDN RD pager number and weekend/on-call pager number located in Amion.    

## 2020-06-09 NOTE — TOC Progression Note (Signed)
Transition of Care [facility]) - Progression Note    Patient Details  Name: Jason [name] MRN: [id number] Date of Birth: Oct 27, 1950  Transition of [facility] CM/SW Contact  Kermit Balo, RN Phone Number: 06/09/2020, 10:11 AM  Clinical Narrative:    CM received phone call from [facility] with [facility] that they will accept the patient for hospice once the son signs the paperwork. Son is out of town until tomorrow. They plan to get paperwork signed tomorrow and then transfer him to hospice if they have a bed over the weekend. MD updated.  TOC following.   Expected Discharge Plan: Skilled Nursing Facility Barriers to Discharge: Continued Medical Work up  Expected Discharge Plan and Services Expected Discharge Plan: Skilled Nursing Facility In-house Referral: Clinical Social Work     Living arrangements for the past 2 months: Skilled Nursing Facility                                       Social Determinants of Health (SDOH) Interventions    Readmission Risk Interventions No flowsheet data found.

## 2020-06-09 NOTE — Progress Notes (Signed)
[facility] PROGRESS NOTE    Progress Note  Jason [name]  KDT:[id number] DOB: Aug 10, 1950 DOA: 05/11/2020 PCP: Sherron Monday, MD     Brief Narrative:   Jason [name] is an 70 y.o. 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  He received Kcentra for reversal of Eliquis was intubated and had a ventricular drain placed on 05/12/2020.  Extubated on 05/16/2020, EVD dislodged on 05/20/2020, was was reintubated on 05/20/2020 eventually extubated on 05/23/2020 and placed on BiPAP.  [facility] where patient has been persistently encephalopathic, palliative care was consulted.  Started developing sepsis physiology on 05/31/2020, after discussion with Perative care CODE STATUS was changed to DNR.  Blood cultures were positive for Candida laparotomy and started on antifungal.  Speech evaluated the patient and recommended a dysphagia 1 diet, his mentation continues to wax and wane  Significant Events: 05/11/2020 admission with a thalamic hemorrhagic infarct and intubated. Extubated on 05/23/2020.  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 05/18/2020 repeated CT of the head without contrast that showed similar size left thalamic intraparenchymal hemorrhage, with an interval placement of a right parietal ventriculostomy catheter with the tip in the right lateral ventricle 05/21/2020 CT of the head that showed interval removal of right frontal ventriculostomy, stable mild ventriculomegaly and ventricular hemorrhage 05/21/2020 CT angio of the head and neck showed negative large vessel occlusion.  Extensive atheromatous plaque. 05/31/2020 CT of the head showed decrease  interventricular hemorrhage with layering within the occipital horns bilaterally.  Antibiotics: 06/05/2020 IV Diflucan  Microbiology data: 05/31/2020 blood culture: Candida glabrata 06/02/2020 blood cultures: Candida glabrata 06/04/2020 blood culture 1 out of 2 positive for staph epidermidis  Procedures: 05/12/2020 EVD placed due to worsening neurological status and high intracranial pressure.  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 He is aspirating, with a fungemia and a new extensive bilateral lower extremity DVT we have explained to them that we believe you will run out of options they seem to understand. After the palliative care meeting they decided to move towards comfort care all medications and labs were drawn and he was started on a morphine as needed.  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 fib/a flutter: Chronic systolic heart failure: Hypovolemic hypernatremia: Hypertensive emergency/uncontrolled hypertension: History of CAD: Diabetes mellitus type 2 uncontrolled with an A1c of 9.4: Thrombocytopenia: New DVT B/L: Goals of care:  Stage II sacral decubitus ulcer: RN Pressure Injury Documentation: Pressure Injury 05/24/20 Buttocks Bilateral;Medial Deep Tissue Pressure Injury - Purple or maroon localized area of discolored intact skin or blood-filled blister due to damage of underlying soft tissue from pressure and/or shear. Multiple blisters along (Active)  05/24/20 0800  Location: Buttocks  Location Orientation: Bilateral;Medial  Staging: Deep Tissue Pressure Injury - Purple or maroon localized area of discolored intact skin or blood-filled blister due to  damage of underlying soft tissue from pressure and/or shear.  Wound Description (Comments): Multiple blisters along with deep tissue injury.  Present on Admission: No     DVT prophylaxis:  SCD Family Communication:Son Status is: Inpatient  Remains inpatient appropriate because:Hemodynamically unstable   Dispo: The patient is from: Home              Anticipated d/c is to: SNF              Patient currently is not medically stable to d/c.   Difficult to place patient No        Code Status:     Code Status Orders  (From admission, onward)         Start     Ordered   06/01/20 1535  Do not attempt resuscitation (DNR)  Continuous       Question Answer Comment  In the event of cardiac or respiratory ARREST Do not call a "code blue"   In the event of cardiac or respiratory ARREST Do not perform Intubation, CPR, defibrillation or ACLS   In the event of cardiac or respiratory ARREST Use medication by any route, position, wound care, and other measures to relive pain and suffering. May use oxygen, suction and manual treatment of airway obstruction as needed for comfort.      06/01/20 1534        Code Status History    Date Active Date Inactive Code Status Order ID Comments User Context   05/11/2020 2334 06/01/2020 1534 Full Code [id number]  Gordy Councilman, MD Inpatient   02/26/2020 1753 03/09/2020 0325 Full Code [id number]  Dellia Cloud, MD ED   Advance Care Planning Activity        IV Access:    Peripheral IV   Procedures and diagnostic studies:   VAS [place] LOWER EXTREMITY VENOUS (DVT)  Result Date: 06/07/2020  Lower Venous DVT Study Indications: Swelling LT>RT.  Anticoagulation: Recently discontinued eliquis due to active bleed. Comparison Study: No prior studies. Performing Technologist: Jean Rosenthal RDMS,RVT  Examination Guidelines: A complete evaluation includes B-mode imaging, spectral Doppler, color Doppler, and power Doppler as needed of all accessible portions  of each vessel. Bilateral testing is considered an integral part of a complete examination. Limited examinations for reoccurring indications may be performed as noted. The reflux portion of the exam is performed with the patient in reverse Trendelenburg.  +---------+---------------+---------+-----------+----------+-------------------+ RIGHT    CompressibilityPhasicitySpontaneityPropertiesThrombus Aging      +---------+---------------+---------+-----------+----------+-------------------+ CFV      Partial        Yes      Yes                  Acute               +---------+---------------+---------+-----------+----------+-------------------+ SFJ      Partial        Yes      Yes                  Acute               +---------+---------------+---------+-----------+----------+-------------------+ FV Prox  Partial        Yes      Yes                  Acute               +---------+---------------+---------+-----------+----------+-------------------+ FV Mid   Full           Yes      Yes                                      +---------+---------------+---------+-----------+----------+-------------------+  FV DistalFull                                                             +---------+---------------+---------+-----------+----------+-------------------+ PFV      Partial        Yes      Yes                  Acute               +---------+---------------+---------+-----------+----------+-------------------+ POP      Full           Yes      Yes                                      +---------+---------------+---------+-----------+----------+-------------------+ PTV      Full                                                             +---------+---------------+---------+-----------+----------+-------------------+ PERO                                                  Not well visualized  +---------+---------------+---------+-----------+----------+-------------------+ GSV      Partial        Yes      Yes                  Acute               +---------+---------------+---------+-----------+----------+-------------------+ EIV                     Yes      Yes                                      +---------+---------------+---------+-----------+----------+-------------------+   +---------+---------------+---------+-----------+----------+-------------------+ LEFT     CompressibilityPhasicitySpontaneityPropertiesThrombus Aging      +---------+---------------+---------+-----------+----------+-------------------+ CFV      None           No       No                   Acute               +---------+---------------+---------+-----------+----------+-------------------+ SFJ      None                                         Acute               +---------+---------------+---------+-----------+----------+-------------------+ FV Prox  None           No       No  Acute               +---------+---------------+---------+-----------+----------+-------------------+ FV Mid                  No       No                   Acute               +---------+---------------+---------+-----------+----------+-------------------+ FV Distal               No       No                   Acute               +---------+---------------+---------+-----------+----------+-------------------+ PFV      None           No       No                   Acute               +---------+---------------+---------+-----------+----------+-------------------+ POP                     No       No                   Acute               +---------+---------------+---------+-----------+----------+-------------------+ PTV      None           No       No                   Acute               +---------+---------------+---------+-----------+----------+-------------------+  PERO                                                  Not well visualized +---------+---------------+---------+-----------+----------+-------------------+ Gastroc  None           No       No                                       +---------+---------------+---------+-----------+----------+-------------------+ EIV      None           No       No                                       +---------+---------------+---------+-----------+----------+-------------------+    Summary: RIGHT: - Findings consistent with acute deep vein thrombosis involving the right common femoral vein, SF junction, right femoral vein, and right proximal profunda vein. - No cystic structure found in the popliteal fossa. - Common femoral vein obstruction doesn't appear to extend above inguinal ligament.  LEFT: - Findings consistent with acute deep vein thrombosis involving the left common femoral vein, SF junction, left femoral vein, left proximal profunda vein, left popliteal vein, left posterior tibial veins, left gastrocnemius veins, and EIV. - No cystic structure found in the popliteal fossa. - Possible obstruction proximal to the inguinal ligament.  *  See table(s) above for measurements and observations. Electronically signed by Gretta Began MD on 06/07/2020 at 2:05:21 PM.    Final      Medical Consultants:    None.   Subjective:    Future [place] nonverbal  Objective:    Vitals:   06/08/20 1434 06/08/20 1448 06/08/20 2055 06/09/20 0805  BP: 104/60  136/84 130/74  Pulse: 71  90 (!) 143  Resp: (!) 36  (!) 30 (!) 24  Temp: (!) 100.8 F (38.2 C) 98.6 F (37 C)  98.7 F (37.1 C)  TempSrc: Axillary   Oral  SpO2: 97%  97% 98%  Weight:      Height:       SpO2: 98 % O2 Flow Rate (L/min): 2 L/min FiO2 (%): 30 %   Intake/Output Summary (Last 24 hours) at 06/09/2020 0904 Last data filed at 06/09/2020 0600 Gross per 24 hour  Intake 105 ml  Output 725 ml  Net -620 ml   Filed Weights   06/03/20 0500  06/04/20 0500 06/07/20 0500  Weight: 102.5 kg 100.9 kg 103.2 kg    Exam: General exam: In no acute distress. Respiratory system: Good air movement and clear to auscultation. Cardiovascular system: S1 & S2 heard, RRR. No JVD. Gastrointestinal system: Abdomen is nondistended, soft and nontender.  Extremities: No pedal edema. Skin: No rashes, lesions or ulcers  Data Reviewed:    Labs: Basic Metabolic Panel: Recent Labs  Lab 06/04/20 0326 06/05/20 0433 06/06/20 0435 06/07/20 0419 06/08/20 1018  NA 146* 146* 149* 150* 155*  K 3.5 3.5 3.0* 3.0* 3.0*  CL 114* 116* 119* 121* 121*  CO2 21* 23 21* 22 24  GLUCOSE 154* 254* 199* 210* 217*  BUN 51* 53* 53* 58* 67*  CREATININE 1.85* 1.79* 1.79* 1.89* 2.20*  CALCIUM 7.9* 8.0* 8.0* 8.2* 8.1*   GFR Estimated Creatinine Clearance: 37.1 mL/min (A) (by C-G formula based on SCr of 2.2 mg/dL (H)). Liver Function Tests: Recent Labs  Lab 06/05/20 0433  AST 69*  ALT 127*  ALKPHOS 385*  BILITOT 0.7  PROT 5.8*  ALBUMIN 1.7*   No results for input(s): LIPASE, AMYLASE in the last 168 hours. No results for input(s): AMMONIA in the last 168 hours. Coagulation profile No results for input(s): INR, PROTIME in the last 168 hours. COVID-19 Labs  No results for input(s): DDIMER, FERRITIN, LDH, CRP in the last 72 hours.  Lab Results  Component Value Date   SARSCOV2NAA NEGATIVE 05/11/2020   SARSCOV2NAA NEGATIVE 03/08/2020   SARSCOV2NAA NEGATIVE 02/26/2020    CBC: Recent Labs  Lab 06/03/20 0117 06/04/20 0326 06/05/20 0433 06/06/20 0435 06/07/20 0419  WBC 6.4 7.7 7.1 9.1 10.1  HGB 12.3* 12.7* 14.7 12.8* 12.8*  HCT 39.2 39.6 44.7 39.5 40.1  MCV 88.7 86.7 85.6 85.9 88.3  PLT 79* 58* 43* 65* 82*   Cardiac Enzymes: No results for input(s): CKTOTAL, CKMB, CKMBINDEX, TROPONINI in the last 168 hours. BNP (last 3 results) No results for input(s): PROBNP in the last 8760 hours. CBG: Recent Labs  Lab 06/07/20 2005 06/07/20 2308  06/08/20 0318 06/08/20 0812 06/08/20 1301  GLUCAP 193* 210* 192* 193* 180*   D-Dimer: No results for input(s): DDIMER in the last 72 hours. Hgb A1c: No results for input(s): HGBA1C in the last 72 hours. Lipid Profile: No results for input(s): CHOL, HDL, LDLCALC, TRIG, CHOLHDL, LDLDIRECT in the last 72 hours. Thyroid function studies: No results for input(s): TSH, T4TOTAL, T3FREE, THYROIDAB in the last 72 hours.  Invalid input(s): FREET3 Anemia work up: No results for input(s): VITAMINB12, FOLATE, FERRITIN, TIBC, IRON, RETICCTPCT in the last 72 hours. Sepsis Labs: Recent Labs  Lab 06/04/20 0326 06/05/20 0433 06/06/20 0435 06/07/20 0419  WBC 7.7 7.1 9.1 10.1   Microbiology Recent Results (from the past 240 hour(s))  Culture, blood (routine x 2)     Status: Abnormal (Preliminary result)   Collection Time: 05/31/20  9:28 AM   Specimen: BLOOD  Result Value Ref Range Status   Specimen Description BLOOD LEFT ANTECUBITAL  Final   Special Requests   Final    BOTTLES DRAWN AEROBIC AND ANAEROBIC Blood Culture results may not be optimal due to an inadequate volume of blood received in culture bottles   Culture  Setup Time (A)  Final    YEAST IN BOTH AEROBIC AND ANAEROBIC BOTTLES CRITICAL RESULT CALLED TO, READ BACK BY AND VERIFIED WITH: PHARMD THOMAS J. 1326 [id number] FCP    Culture (A)  Final    CANDIDA GLABRATA Sent to [facility] for further susceptibility testing. Performed at [facility], [address], [place], [place] [address]    Report Status PENDING  Incomplete  Blood Culture ID Panel (Reflexed)     Status: Abnormal   Collection Time: 05/31/20  9:28 AM  Result Value Ref Range Status   Enterococcus faecalis NOT DETECTED NOT DETECTED Final   Enterococcus Faecium NOT DETECTED NOT DETECTED Final   Listeria monocytogenes NOT DETECTED NOT DETECTED Final   Staphylococcus species NOT DETECTED NOT DETECTED Final   Staphylococcus aureus (BCID) NOT DETECTED NOT DETECTED  Final   Staphylococcus epidermidis NOT DETECTED NOT DETECTED Final   Staphylococcus lugdunensis NOT DETECTED NOT DETECTED Final   Streptococcus species NOT DETECTED NOT DETECTED Final   Streptococcus agalactiae NOT DETECTED NOT DETECTED Final   Streptococcus pneumoniae NOT DETECTED NOT DETECTED Final   Streptococcus pyogenes NOT DETECTED NOT DETECTED Final   A.calcoaceticus-baumannii NOT DETECTED NOT DETECTED Final   Bacteroides fragilis NOT DETECTED NOT DETECTED Final   Enterobacterales NOT DETECTED NOT DETECTED Final   Enterobacter cloacae complex NOT DETECTED NOT DETECTED Final   Escherichia coli NOT DETECTED NOT DETECTED Final   Klebsiella aerogenes NOT DETECTED NOT DETECTED Final   Klebsiella oxytoca NOT DETECTED NOT DETECTED Final   Klebsiella pneumoniae NOT DETECTED NOT DETECTED Final   Proteus species NOT DETECTED NOT DETECTED Final   Salmonella species NOT DETECTED NOT DETECTED Final   Serratia marcescens NOT DETECTED NOT DETECTED Final   Haemophilus influenzae NOT DETECTED NOT DETECTED Final   Neisseria meningitidis NOT DETECTED NOT DETECTED Final   Pseudomonas aeruginosa NOT DETECTED NOT DETECTED Final   Stenotrophomonas maltophilia NOT DETECTED NOT DETECTED Final   Candida albicans NOT DETECTED NOT DETECTED Final   Candida auris NOT DETECTED NOT DETECTED Final   Candida glabrata DETECTED (A) NOT DETECTED Final    Comment: CRITICAL RESULT CALLED TO, READ BACK BY AND VERIFIED WITH: PHARMD THOMAS J. 1326 [id number] FCP    Candida krusei NOT DETECTED NOT DETECTED Final   Candida parapsilosis NOT DETECTED NOT DETECTED Final   Candida tropicalis NOT DETECTED NOT DETECTED Final   Cryptococcus neoformans/gattii NOT DETECTED NOT DETECTED Final    Comment: Performed at [facility], [address], [place], [place] [address]  Antifungal AST 9 Drug Panel     Status: None   Collection Time: 05/31/20  9:28 AM  Result Value Ref Range Status   Organism ID, Yeast Candida glabrata   Corrected  Comment: (NOTE) Identification performed by account, not confirmed by this laboratory. CORRECTED ON 03/22 AT 1036: PREVIOUSLY REPORTED AS Preliminary report    Amphotericin B MIC 1.0 ug/mL  Final    Comment: (NOTE) *Breakpoints have been established for only some organism-drug combinations as indicated. Results of this test are labeled for research purposes only by the assay's manufacturer. The performance characteristics of this assay have not been established by the manufacturer. The result should not be used for treatment or for diagnostic purposes without confirmation of the diagnosis by another medically established diagnostic product or procedure. The performance characteristics were determined by [facility].    Please Note: PENDING  Incomplete   Anidulafungin MIC Comment  Final    Comment: (NOTE) 0.03 ug/mL Susceptible *Breakpoints have been established for only some organism-drug combinations as indicated. Results of this test are labeled for research purposes only by the assay's manufacturer. The performance characteristics of this assay have not been established by the manufacturer. The result should not be used for treatment or for diagnostic purposes without confirmation of the diagnosis by another medically established diagnostic product or procedure. The performance characteristics were determined by [facility].    Caspofungin MIC Comment  Final    Comment: 0.06 ug/mL Susceptible   Micafungin MIC Comment  Final    Comment: 0.016 ug/mL Susceptible   Posaconazole MIC 1.0 ug/mL  Final    Comment: (NOTE) *Breakpoints have been established for only some organism-drug combinations as indicated. Results of this test are labeled for research purposes only by the assay's manufacturer. The performance characteristics of this assay have not been established by the manufacturer. The result should not be used for treatment or for diagnostic purposes  without confirmation of the diagnosis by another medically established diagnostic product or procedure. The performance characteristics were determined by [facility].    Please Note: PENDING  Incomplete   Fluconazole Islt MIC 16.0 ug/mL  Final    Comment: Susceptible Dose Dependent   Flucytosine MIC 0.06 ug/mL or less  Final   Itraconazole MIC 1.0 ug/mL  Final   Voriconazole MIC 0.5 ug/mL  Final    Comment: (NOTE) Performed At: [facility] [place] [address] [place], [place] [id number] Jolene Schimke MD XL:[id number]    Source PENDING  Incomplete  Culture, blood (routine x 2)     Status: Abnormal (Preliminary result)   Collection Time: 05/31/20  9:36 AM   Specimen: BLOOD LEFT HAND  Result Value Ref Range Status   Specimen Description BLOOD LEFT HAND  Final   Special Requests   Final    BOTTLES DRAWN AEROBIC AND ANAEROBIC Blood Culture results may not be optimal due to an inadequate volume of blood received in culture bottles   Culture  Setup Time   Final    YEAST IN BOTH AEROBIC AND ANAEROBIC BOTTLES CRITICAL VALUE NOTED.  VALUE IS CONSISTENT WITH PREVIOUSLY REPORTED AND CALLED VALUE. Performed at [facility], [address] [place], [place] [address]    Culture CANDIDA GLABRATA (A)  Final   Report Status PENDING  Incomplete  MRSA PCR Screening     Status: None   Collection Time: 05/31/20  3:52 PM   Specimen: Nasal Mucosa; Nasopharyngeal  Result Value Ref Range Status   MRSA by PCR NEGATIVE NEGATIVE Final    Comment:        The GeneXpert MRSA Assay (FDA approved for NASAL specimens only), is one component of a comprehensive MRSA colonization surveillance program. It is not intended to  diagnose MRSA infection nor to guide or monitor treatment for MRSA infections. Performed at [facility], [address], [place], [place] [address]   Culture, blood (Routine X 2) w Reflex to ID Panel     Status: Abnormal   Collection Time: 06/02/20 10:08 AM   Specimen:  BLOOD  Result Value Ref Range Status   Specimen Description BLOOD LEFT ANTECUBITAL  Final   Special Requests   Final    BOTTLES DRAWN AEROBIC ONLY Blood Culture adequate volume   Culture  Setup Time   Final    YEAST AEROBIC BOTTLE ONLY CRITICAL VALUE NOTED.  VALUE IS CONSISTENT WITH PREVIOUSLY REPORTED AND CALLED VALUE. Performed at [facility], [address] [place], [place] [address]    Culture CANDIDA GLABRATA (A)  Final   Report Status 06/07/2020 FINAL  Final  Culture, blood (Routine X 2) w Reflex to ID Panel     Status: Abnormal   Collection Time: 06/02/20 10:08 AM   Specimen: BLOOD LEFT HAND  Result Value Ref Range Status   Specimen Description BLOOD LEFT HAND  Final   Special Requests   Final    BOTTLES DRAWN AEROBIC ONLY Blood Culture adequate volume   Culture  Setup Time   Final    YEAST WITH PSEUDOHYPHAE AEROBIC BOTTLE ONLY CRITICAL RESULT CALLED TO, READ BACK BY AND VERIFIED WITH: J. Eden Emms, AT [id number] 06/05/20 Renato Shin Performed at [facility], [address], [place], [place] [address]    Culture CANDIDA GLABRATA (A)  Final   Report Status 06/07/2020 FINAL  Final  Culture, blood (routine x 2)     Status: Abnormal   Collection Time: 06/04/20  3:19 PM   Specimen: BLOOD  Result Value Ref Range Status   Specimen Description BLOOD LEFT ANTECUBITAL  Final   Special Requests   Final    BOTTLES DRAWN AEROBIC AND ANAEROBIC Blood Culture results may not be optimal due to an inadequate volume of blood received in culture bottles   Culture  Setup Time   Final    GRAM POSITIVE COCCI IN CLUSTERS ANAEROBIC BOTTLE ONLY CRITICAL RESULT CALLED TO, READ BACK BY AND VERIFIED WITH: PHARMD JESSICA C 2000 [id number] FCP    Culture (A)  Final    STAPHYLOCOCCUS EPIDERMIDIS THE SIGNIFICANCE OF ISOLATING THIS ORGANISM FROM A SINGLE SET OF BLOOD CULTURES WHEN MULTIPLE SETS ARE DRAWN IS UNCERTAIN. PLEASE NOTIFY THE MICROBIOLOGY DEPARTMENT WITHIN ONE WEEK IF SPECIATION AND  SENSITIVITIES ARE REQUIRED. Performed at [facility], [address], [place], [place] [address]    Report Status 06/07/2020 FINAL  Final  Blood Culture ID Panel (Reflexed)     Status: Abnormal   Collection Time: 06/04/20  3:19 PM  Result Value Ref Range Status   Enterococcus faecalis NOT DETECTED NOT DETECTED Final   Enterococcus Faecium NOT DETECTED NOT DETECTED Final   Listeria monocytogenes NOT DETECTED NOT DETECTED Final   Staphylococcus species DETECTED (A) NOT DETECTED Final    Comment: CRITICAL RESULT CALLED TO, READ BACK BY AND VERIFIED WITH: PHARMD JESSICA C 2000 [id number] FCP    Staphylococcus aureus (BCID) NOT DETECTED NOT DETECTED Final   Staphylococcus epidermidis DETECTED (A) NOT DETECTED Final    Comment: Methicillin (oxacillin) resistant coagulase negative staphylococcus. Possible blood culture contaminant (unless isolated from more than one blood culture draw or clinical case suggests pathogenicity). No antibiotic treatment is indicated for blood  culture contaminants. CRITICAL RESULT CALLED TO, READ BACK BY  AND VERIFIED WITH: PHARMD JESSICA C 2000 [id number] FCP    Staphylococcus lugdunensis NOT DETECTED NOT DETECTED Final   Streptococcus species NOT DETECTED NOT DETECTED Final   Streptococcus agalactiae NOT DETECTED NOT DETECTED Final   Streptococcus pneumoniae NOT DETECTED NOT DETECTED Final   Streptococcus pyogenes NOT DETECTED NOT DETECTED Final   A.calcoaceticus-baumannii NOT DETECTED NOT DETECTED Final   Bacteroides fragilis NOT DETECTED NOT DETECTED Final   Enterobacterales NOT DETECTED NOT DETECTED Final   Enterobacter cloacae complex NOT DETECTED NOT DETECTED Final   Escherichia coli NOT DETECTED NOT DETECTED Final   Klebsiella aerogenes NOT DETECTED NOT DETECTED Final   Klebsiella oxytoca NOT DETECTED NOT DETECTED Final   Klebsiella pneumoniae NOT DETECTED NOT DETECTED Final   Proteus species NOT DETECTED NOT DETECTED Final   Salmonella species NOT  DETECTED NOT DETECTED Final   Serratia marcescens NOT DETECTED NOT DETECTED Final   Haemophilus influenzae NOT DETECTED NOT DETECTED Final   Neisseria meningitidis NOT DETECTED NOT DETECTED Final   Pseudomonas aeruginosa NOT DETECTED NOT DETECTED Final   Stenotrophomonas maltophilia NOT DETECTED NOT DETECTED Final   Candida albicans NOT DETECTED NOT DETECTED Final   Candida auris NOT DETECTED NOT DETECTED Final   Candida glabrata NOT DETECTED NOT DETECTED Final   Candida krusei NOT DETECTED NOT DETECTED Final   Candida parapsilosis NOT DETECTED NOT DETECTED Final   Candida tropicalis NOT DETECTED NOT DETECTED Final   Cryptococcus neoformans/gattii NOT DETECTED NOT DETECTED Final   Methicillin resistance mecA/C DETECTED (A) NOT DETECTED Final    Comment: CRITICAL RESULT CALLED TO, READ BACK BY AND VERIFIED WITH: PHARMD JESSICA C 2000 [id number] FCP Performed at [facility], [address], [place], [place] [address]   Culture, blood (routine x 2)     Status: None   Collection Time: 06/04/20  3:20 PM   Specimen: BLOOD RIGHT HAND  Result Value Ref Range Status   Specimen Description BLOOD RIGHT HAND  Final   Special Requests   Final    BOTTLES DRAWN AEROBIC ONLY Blood Culture results may not be optimal due to an inadequate volume of blood received in culture bottles   Culture   Final    NO GROWTH 5 DAYS Performed at [facility], [address], [place], [place] [address]    Report Status 06/09/2020 FINAL  Final     Medications:   . levETIRAcetam  750 mg Oral BID  . mouth rinse  15 mL Mouth Rinse q12n4p  .  morphine injection  2 mg Intravenous Q4H   Continuous Infusions:     LOS: 29 days   Marinda Elk  [facility]  06/09/2020, 9:04 AM

## 2020-06-10 LAB — CULTURE, BLOOD (ROUTINE X 2)

## 2020-06-10 MED ORDER — LEVETIRACETAM 100 MG/ML PO SOLN: 500.0000 mg | Freq: Two times a day (BID) | ORAL | 12 refills | Status: AC

## 2020-06-10 MED ORDER — MORPHINE SULFATE 20 MG/5ML PO SOLN: 10.0000 mg | ORAL | 0 refills | Status: AC | PRN

## 2020-06-10 MED ORDER — GLYCOPYRROLATE 1 MG PO TABS: 1.0000 mg | ORAL_TABLET | ORAL | Status: AC | PRN

## 2020-06-10 MED ORDER — SODIUM CHLORIDE 0.9 % IV SOLN
750.0000 mg | Freq: Two times a day (BID) | INTRAVENOUS | Status: DC
  Administered 2020-06-10: 750 mg via INTRAVENOUS
  Filled 2020-06-10 (×2): qty 7.5

## 2020-06-10 NOTE — Progress Notes (Signed)
   Palliative Medicine Inpatient Follow Up Note  HPI: 70 y.o.malewith past medical history of DM, previous CVA, CAD/MI/CABG/Afib on anticoagulation, anxiety and depressionwho was admitted on 2/24/2022with altered mental status and hypertensive emergency. 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 He is currently in a floor bed. His albumin is 2.1. 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 He has developed fungemia likely source is decubitus ulcer. He also has bilateral DVT's- cannot anticoagulate due to medical status.  Palliative medicine has been consulting for goals of care.   Today's Discussion (06/10/2020):  *Please note that this is a verbal dictation therefore any spelling or grammatical errors are due to the "[employer]agon [employer] One" system interpretation.  Norman's symptoms remain to be well controlled with ATC medications.  Kathan appeared comfortable this afternoon, he was less alert today.   Reviewed the plan to transition Alexandra to [facility] today.  Questions and concerns addressed   Objective Assessment: Vital Signs Vitals:   06/09/20 2324 06/10/20 0828  BP: (!) 144/70 109/85  Pulse: (!) 143 (!) 127  Resp: (!) 22 20  Temp: (!) 100.5 F (38.1 C) 98.3 F (36.8 C)  SpO2: 98%     Intake/Output Summary (Last 24 hours) at 06/10/2020 1552 Last data filed at 06/10/2020 0535 Gross per 24 hour  Intake -  Output 1700 ml  Net -1700 ml   Last Weight  Most recent update: 06/07/2020  6:57 AM   Weight  103.2 kg (227 lb 8.2 oz)           Gen:  Ill appearing elderly M HEENT: Dry mucous membranes CV: Regular rate and rhythm  PULM: On RA, even nonlabored ABD: soft/nontender  EXT: (+) Generalized edema Neuro: Lethargic  SUMMARY  OF RECOMMENDATIONS   DNAR/DNI  Comfort focused Care - Continue scheduled medications  Transition to  inpatient hospice in [facility]  Ongoing Palliative care support  Time Spent: 15 Greater than 50% of the time was spent in counseling and coordination of care ______________________________________________________________________________________ Lamarr Lulas [facility] Team Team Cell Phone: [phone] Please utilize secure chat with additional questions, if there is no response within 30 minutes please call the above phone number  Palliative Medicine Team providers are available by phone from 7am to 7pm daily and can be reached through the team cell phone.  Should this patient require assistance outside of these hours, please call the patient's attending physician.

## 2020-06-10 NOTE — Discharge Summary (Signed)
Physician Discharge Summary  Johnathyn Viscomi WUJ:[id number] DOB: 1950/12/26 DOA: 05/11/2020  PCP: Sherron Monday, MD  Admit date: 05/11/2020 Discharge date: 06/10/2020  Admitted From: Home Disposition:  Hospice   Home Health:no Equipment/Devices:none  Discharge Condition:Hospice CODE STATUS:DNR Diet recommendation: Heart Healthy  Brief/Interim Summary: Samil Mecham is an 70 y.o. 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  He received Kcentra for reversal of Eliquis was intubated and had a ventricular drain placed on 05/12/2020.  Extubated on 05/16/2020, EVD dislodged on 05/20/2020, was was reintubated on 05/20/2020 eventually extubated on 05/23/2020 and placed on BiPAP.  [facility] where patient has been persistently encephalopathic, palliative care was consulted.  Started developing sepsis physiology on 05/31/2020, after discussion with Perative care CODE STATUS was changed to DNR.  Blood cultures were positive for Candida laparotomy and started on antifungal.  Speech evaluated the patient and recommended a dysphagia 1 diet, his mentation continues to wax and wane  Significant Events: 05/11/2020 admission with a thalamic hemorrhagic infarct and intubated. Extubated on 05/23/2020.  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 05/18/2020 repeated CT of the head without contrast that showed similar size left thalamic intraparenchymal hemorrhage, with an interval placement of a right parietal ventriculostomy catheter with the tip in the right lateral ventricle 05/21/2020 CT of the head that showed interval removal of right frontal ventriculostomy, stable mild  ventriculomegaly and ventricular hemorrhage 05/21/2020 CT angio of the head and neck showed negative large vessel occlusion.  Extensive atheromatous plaque. 05/31/2020 CT of the head showed decrease interventricular hemorrhage with layering within the occipital horns bilaterally.  Antibiotics: 06/05/2020 IV Diflucan  Microbiology data: 05/31/2020 blood culture: Candida glabrata 06/02/2020 blood cultures: Candida glabrata 06/04/2020 blood culture 1 out of 2 positive for staph epidermidis  Procedures: 05/12/2020 EVD placed due to worsening neurological status and high intracranial pressure.  Removed on 05/20/2020  Discharge Diagnoses:  Active Problems:   Pressure injury of skin   IVH (intraventricular hemorrhage) (HCC)   Altered mental status   Goals of care, counseling/discussion   Hypertensive emergency   Atrial flutter (HCC)   Fungemia   Metabolic encephalopathy   Severe sepsis without septic shock (HCC)   Acute respiratory failure (HCC)   Aspiration into airway   Intracranial hemorrhage (HCC)   Encounter for imaging study to confirm nasogastric (NG) tube placement   Acute deep vein thrombosis (DVT) of distal vein of right lower extremity (HCC)   DVT (deep vein thrombosis) in pregnancy   DVT, lower extremity, distal, acute, bilateral (HCC)  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 The family talked with palliative care and decided to move towards comfort care.  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 MRI of the brain did not show leptomeningeal enhancement. ID spoke with  the family, ophthalmology was consulted that showed no signs with an ophthalmitis. His fever did improve, but I did explain the poor prognosis he had. The family decided to move towards comfort care and treatment was stopped.  Newly diagnosed extensive lower extremity DVT: Not a candidate for anticoagulation or IVC filter due to his fungemia and intracranial hemorrhage.  Acute respiratory failure with hypoxia Persistent atrial fibrillation Chronic systolic heart failure Hypovolemic hyponatremia Hypertensive emergency History of CAD Diabetes mellitus type 2 uncontrolled with an A1c of 9.4 Thrombocytopenia Lower extremity DVT bilaterally   Discharge Instructions  Discharge Instructions    Ambulatory referral to Neurology   Complete by: As directed    Follow up with stroke clinic NP (Jessica Vanschaick or Darrol Angel, if both not available, consider Manson Allan, or [facility]) at [facility] in about 4 weeks. Thanks.   Diet - low sodium heart healthy   Complete by: As directed    Increase activity slowly   Complete by: As directed    No wound care   Complete by: As directed      Allergies as of 06/10/2020   No Known Allergies     Medication List    STOP taking these medications   acetaminophen 650 MG CR tablet Commonly known as: TYLENOL   aspirin EC 81 MG tablet   Basaglar KwikPen 100 UNIT/ML   benazepril 40 MG tablet Commonly known as: LOTENSIN   buPROPion 75 MG tablet Commonly known as: WELLBUTRIN   docusate sodium 100 MG capsule Commonly known as: COLACE   doxycycline 100 MG capsule Commonly known as: VIBRAMYCIN   Eliquis 5 MG Tabs tablet Generic drug: apixaban   insulin lispro 100 UNIT/ML injection Commonly known as: HUMALOG   insulin lispro 100 UNIT/ML KwikPen Commonly known as: HUMALOG   lidocaine 5 % Commonly known  as: LIDODERM   melatonin 3 MG Tabs tablet   metoprolol tartrate 50 MG tablet Commonly known as: LOPRESSOR   ondansetron 4 MG tablet Commonly known as: ZOFRAN   polyethylene glycol 17 g packet Commonly known as: MIRALAX / GLYCOLAX   rosuvastatin 20 MG tablet Commonly known as: CRESTOR     TAKE these medications   glycopyrrolate 1 MG tablet Commonly known as: ROBINUL Take 1 tablet (1 mg total) by mouth every 4 (four) hours as needed (excessive secretions).   levETIRAcetam 100 MG/ML solution Commonly known as: Keppra Take 5 mLs (500 mg total) by mouth 2 (two) times daily.   morphine 20 MG/5ML solution Take 2.5 mLs (10 mg total) by mouth every 2 (two) hours as needed for pain.       Follow-up Information    [facility]. Schedule an appointment as soon as possible for a visit in 4 week(s).   Specialty: Neurology Contact information: [address] [place] [address] [phone]             No Known Allergies  Consultations: Infectious disease Neurosurgery PCCM Palliative care   Procedures/Studies: DG Abd 1 View  Result Date: 05/24/2020 CLINICAL DATA:  Abdominal pain Altered mental status EXAM: ABDOMEN - 1 VIEW COMPARISON:  05/12/2020 FINDINGS: Mildly dilated loop of small bowel in the left abdomen may be due to localized ileus. Air seen within the distal colon. No suspicious calcifications. Multilevel degenerative changes of the lower lumbar spine. Nasogastric tube terminates in the left upper quadrant. Mildly displaced fracture of the lateral segment of the right ninth rib. IMPRESSION: Nonobstructive nonspecific bowel gas pattern. Short segment of  dilated small bowel loops in the left abdomen may be due to localized ileus. Electronically Signed   By: Acquanetta Belling M.D.   On: 05/24/2020 09:08   DG Abd 1 View  Result Date: 05/12/2020 CLINICAL DATA:  Orogastric tube placement EXAM: ABDOMEN - 1 VIEW COMPARISON:  CT  abdomen and pelvis February 26, 2020 FINDINGS: Orogastric tube tip and side port in stomach. Visualized bowel appears unremarkable without bowel dilatation or air-fluid level to suggest bowel obstruction. No free air evident on supine examination. IMPRESSION: Orogastric tube tip and side port in stomach. Visualized bowel appears unremarkable on supine examination. Electronically Signed   By: Bretta Bang III M.D.   On: 05/12/2020 09:31   CT HEAD WO CONTRAST  Result Date: 05/31/2020 CLINICAL DATA:  Mental status change.  Sepsis. EXAM: CT HEAD WITHOUT CONTRAST TECHNIQUE: Contiguous axial images were obtained from the base of the skull through the vertex without intravenous contrast. COMPARISON:  March 6 22. FINDINGS: Brain: Decreased intraventricular hemorrhage layering within the occipital horns bilaterally. No evidence of new hemorrhage. No evidence of acute large vascular territory infarct. Similar patchy white matter hypodensities, most likely related to chronic microvascular ischemic disease. Similar sequela of prior right frontal ventriculostomy with resolution of the previously seen hemorrhage along the tract. Vascular: Calcific atherosclerosis. No hyperdense vessel identified. Skull: Right frontal burr hole.  No acute fracture. Sinuses/Orbits: Opacification of a left ethmoid air cell. Mucosal thickening of the right sphenoid sinus with air-fluid level and frothy secretions. Unremarkable orbits. Other: Trace bilateral mastoid effusions. IMPRESSION: 1. Decreased intraventricular hemorrhage layering within the occipital horns bilaterally. Similar ventriculomegaly. 2. Paranasal sinus mucosal thickening with frothy secretions and air-fluid level in the right sphenoid sinus. Electronically Signed   By: Feliberto Harts MD   On: 05/31/2020 11:10   CT HEAD WO CONTRAST  Result Date: 05/21/2020 CLINICAL DATA:  Stroke, follow-up examination EXAM: CT HEAD WITHOUT CONTRAST TECHNIQUE: Contiguous axial images  were obtained from the base of the skull through the vertex without intravenous contrast. COMPARISON:  05/19/2020 FINDINGS: Brain: Right frontal ventriculostomy has been removed a tiny focus of intraparenchymal hemorrhage is seen along the defect of the catheter tract as well as mild edema. Mild ventriculomegaly is unchanged. Dependently layering hemorrhage is again seen within the occipital horns of the lateral ventricles, stable since prior examination. No interval hemorrhage. Parenchymal volume loss is commensurate with the patient's age. Mild periventricular white matter changes are present. Remote infarcts are noted within the right corona radiata and periventricular white matter as well as left thalamus. No abnormal mass effect or midline shift. Cerebellum unremarkable. Extensive atherosclerotic calcification again noted within the carotid siphons and basilar artery. Vascular: No asymmetric hyperdense vasculature seen at the skull base. Skull: No acute fracture. Sinuses/Orbits: Moderate mucosal thickening noted within the right sphenoid sinus. Remaining paranasal sinuses are clear. Orbits are unremarkable. Nasoenteric feeding tube partially visualized within the left nares. Other: Mastoid air cells and middle ear cavities are clear. IMPRESSION: Interval removal of right frontal ventriculostomy. Stable mild ventriculomegaly and intraventricular hemorrhage dependently within the occipital horns. No interval hemorrhage. Senescent changes and remote infarcts again noted, unchanged. Right sphenoid sinusitis. Electronically Signed   By: Helyn Numbers MD   On: 05/21/2020 01:25   CT HEAD WO CONTRAST  Result Date: 05/19/2020 CLINICAL DATA:  A history of recent stroke with intraventricular hemorrhage and left thalamic hemorrhage EXAM: CT HEAD WITHOUT CONTRAST TECHNIQUE: Contiguous axial images were obtained from the base of the skull through the vertex  without intravenous contrast. COMPARISON:  Multiple previous  exams the most recent of which is dated 05/18/2020 FINDINGS: Brain: There again noted changes of intraventricular hemorrhage particularly within the occipital horns of the lateral ventricles bilaterally. Ventriculostomy catheter is again noted on the right and stable. The degree of third ventricular hemorrhage and left thalamic parenchymal hemorrhage has resolved similar to that seen on the prior day. No new hemorrhage is seen. Persistent ventricular dilatation is noted. Mild atrophic changes and chronic white matter ischemic changes are seen. No acute infarct is noted. Vascular: No hyperdense vessel or unexpected calcification. Skull: Normal. Negative for fracture or focal lesion. Sinuses/Orbits: Persistent opacification of the right sphenoid sinus. Other: None IMPRESSION: Persistent intraventricular hemorrhage within the lateral ventricles posteriorly. Ventriculostomy catheter is again identified on the right and stable. No new focal hemorrhage is seen. Chronic atrophic and ischemic changes. Electronically Signed   By: Alcide Clever M.D.   On: 05/19/2020 05:45   CT HEAD WO CONTRAST  Result Date: 05/18/2020 CLINICAL DATA:  Intracranial hemorrhage, follow-up examination EXAM: CT HEAD WITHOUT CONTRAST TECHNIQUE: Contiguous axial images were obtained from the base of the skull through the vertex without intravenous contrast. COMPARISON:  05/13/2020 FINDINGS: Brain: Intraventricular clot within the third ventricle as well as intraparenchymal hematoma within the medial left thalamus have resolved. Layering intraventricular hemorrhage is again seen with the occipital horns of the a lateral ventricles bilaterally. Ventricular size is stable. Right frontal ventriculostomy is again identified with its tip in the region of the foramina [place]. No interval hemorrhage. Parenchymal volume loss is commensurate with the patient's age. Moderate periventricular white matter changes are present. Remote lacunar infarct noted within  the right corona radiata and left subinsular white matter. No extra-axial fluid collection identified. Cerebellum is unremarkable. Vascular: No asymmetric hyperdense vasculature at the skull base. Skull: Intact Sinuses/Orbits: There is opacification of the right sphenoid sinus. Remaining paranasal sinuses are clear. Orbits are unremarkable. Other: Mastoid air cells and middle ear cavities are clear. IMPRESSION: Interval resolution of intraventricular clot within the third ventricle and intraparenchymal hematoma within the left thalamus. Stable volume intraventricular hemorrhage within the occipital horns bilaterally. Right ventriculostomy in place.  Stable ventricular size. Stable senescent changes with bilateral remote infarcts as outlined above. Electronically Signed   By: Helyn Numbers MD   On: 05/18/2020 04:53   CT HEAD WO CONTRAST  Result Date: 05/13/2020 CLINICAL DATA:  Known intraparenchymal hemorrhage with episode of decreased response EXAM: CT HEAD WITHOUT CONTRAST TECHNIQUE: Contiguous axial images were obtained from the base of the skull through the vertex without intravenous contrast. COMPARISON:  Head CT May 12, 2020 FINDINGS: Brain: Medial left thalamic intraparenchymal hemorrhage appears similar in size measuring approximately 1 cm on series 19, image 3, with increased volume of intraventricular blood extending from the area of hemorrhage. Including filling both the third ventricle with layering blood in the fourth and lateral ventricle. Degree of ventriculomegaly appears similar to prior without increased dilation. Interval placement of a right parietal approach ventriculostomy catheter with tip in the right lateral ventricle. Trace pneumocephalus. Similar extensive chronic small vessel ischemic change are present throughout the white matter. Vascular: No hyperdense vessel. Atherosclerotic calcifications of the major vessels of the base of the brain are again noted. Skull: Right parietal  burr hole. Sinuses/Orbits: Layering fluid noted in the sphenoid sinuses consistent with chronic sinusitis. Other: None IMPRESSION: 1. Similar size of medial left thalamic intraparenchymal hemorrhage with increased volume of intraventricular blood extending from the area of hemorrhage. 2.  Interval placement of a right parietal approach ventriculostomy catheter with tip in the right lateral ventricle. Degree of ventriculomegaly appears similar to prior without increased dilation. Electronically Signed   By: Maudry Mayhew MD   On: 05/13/2020 12:14   CT HEAD WO CONTRAST  Result Date: 05/12/2020 CLINICAL DATA:  Follow-up intracranial hemorrhage EXAM: CT HEAD WITHOUT CONTRAST TECHNIQUE: Contiguous axial images were obtained from the base of the skull through the vertex without intravenous contrast. COMPARISON:  05/11/2020 FINDINGS: Brain: Medial left thalamic intraparenchymal hemorrhage appears the same, approximately 1 cm in size. Extension into the ventricular system with the amount of intraventricular blood being very similar. Filling of the fourth ventricle. Dependent blood in both occipital horns. Filling of the third ventricle. Areas of clot in both lateral ventricles. Ventriculomegaly appears the same without further dilatation. Elsewhere, chronic small-vessel ischemic changes are present throughout the white matter. Vascular: There is atherosclerotic calcification of the major vessels at the base of the brain. Skull: Negative Sinuses/Orbits: Fluid level in the sphenoid sinus consistent with rhinosinusitis. Orbits negative Other: None IMPRESSION: 1. Medial left thalamic intraparenchymal hemorrhage appears the same, approximately 1 cm in size. Extension into the ventricular system with the amount of intraventricular blood being very similar. 2. Ventriculomegaly appears the same without further dilatation. 3. Chronic small-vessel ischemic changes of the white matter. 4. Fluid level in the sphenoid sinus  consistent with rhinosinusitis. Electronically Signed   By: Paulina Fusi M.D.   On: 05/12/2020 02:26   CT HEAD WO CONTRAST  Result Date: 05/11/2020 CLINICAL DATA:  Mental status change, unknown cause EXAM: CT HEAD WITHOUT CONTRAST TECHNIQUE: Contiguous axial images were obtained from the base of the skull through the vertex without intravenous contrast. COMPARISON:  02/26/2020 and prior FINDINGS: Brain: Acute left thalamic hemorrhage measuring approximately 1.2 x 0.8 x 0.8 cm. Intraventricular extension involving the lateral, third and fourth ventricles. No midline shift. No ventriculomegaly. Mild cerebral atrophy with ex vacuo dilatation. Chronic microvascular ischemic changes. No mass lesion. Chronic right corona radiata lacunar insult. Vascular: No hyperdense vessel or unexpected calcification. Bilateral skull base atherosclerotic calcifications. Skull: Negative for fracture or focal lesion. Sinuses/Orbits: Normal orbits. Right sphenoid sinus layering secretions. No mastoid effusion. Other: White please note image quality is degraded by motion artifact. 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 Mild cerebral atrophy and chronic microvascular ischemic changes. Chronic right corona radiata lacunar insult. These results were called by telephone at the time of interpretation on 05/11/2020 at 8:29 pm to provider [facility] , who verbally acknowledged these results. Electronically Signed   By: Stana Bunting M.D.   On: 05/11/2020 20:31   MR BRAIN W WO CONTRAST  Result Date: 06/02/2020 CLINICAL DATA:  Thalamic hemorrhage with intraventricular extension. Meningitis/infection suspected. EXAM: MRI HEAD WITHOUT AND WITH CONTRAST TECHNIQUE: Multiplanar, multiecho pulse sequences of the brain and surrounding structures were obtained without and with intravenous contrast. CONTRAST:  7.50mL GADAVIST GADOBUTROL 1 MMOL/ML IV SOLN COMPARISON:  CT 2 days ago. Multiple previous CNS  imaging exams since late February of this year. FINDINGS: Brain: Diffusion imaging shows a 7 or 8 mm focus of acute infarction within the deep white matter adjacent to the atrium of the right lateral ventricle. Two adjacent punctate acute cortical infarctions at the left posterior frontal vertex. Possible punctate acute deep white matter infarction of the corona radiography region the left. Few punctate acute infarctions at the superior cerebellar vermis. No large vessel territory infarction. Atrophy of the brainstem with old small  vessel insults. Small focus of old hemorrhage in the right cerebellar hemisphere. Cerebral hemispheres show generalized atrophy. Resolving changes of recent intraparenchymal hemorrhage in the left thalamus. Scattered foci hemosiderin deposition within the brain consistent with old brain hemorrhages. Findings raise the possibility of amyloid angiopathy. There is moderate ventriculomegaly as seen previously. Some residual blood products or layering dependently in the occipital horns of the lateral ventricles. Ventricular size does not appear increased over baseline. Chronic small-vessel ischemic changes are seen in a widespread fashion throughout the cerebral hemispheric white matter. After contrast administration, there is no evidence of abnormal parenchymal or leptomeningeal enhancement to suggest intracranial infection by imaging. Vascular: Major vessels at the base of the brain show flow. Skull and upper cervical spine: Negative Sinuses/Orbits: Paranasal sinuses are clear.  Orbits are negative. Other: Bilateral mastoid effusions are present. IMPRESSION: 1. Resolving changes of recent intraparenchymal hemorrhage in the left thalamus. Scattered foci of hemosiderin deposition throughout the brain consistent with old brain hemorrhages. Pattern suggests amyloid angiopathy. 2. 7 or 8 mm focus of acute infarction within the deep white matter adjacent to the atrium of the right lateral  ventricle. Two adjacent punctate acute cortical infarctions at the left posterior frontal vertex. Possible punctate acute deep white matter infarction of the corona radiography region the left corona radiography region. Adjacent punctate acute infarctions at the superior cerebellar vermis. This pattern of acute infarctions could be due to micro embolic disease from the heart or ascending aorta or could conceivably be secondary to vascular spasm induced by the previous hemorrhage. 3. No evidence of abnormal parenchymal or leptomeningeal enhancement to suggest intracranial infection by imaging. 4. Moderate ventriculomegaly as seen previously. Some residual blood products or layering dependently in the occipital horns of the lateral ventricles. Ventricular size does not appear increased over baseline. Electronically Signed   By: Paulina Fusi M.D.   On: 06/02/2020 16:19   DG CHEST PORT 1 VIEW  Result Date: 06/06/2020 CLINICAL DATA:  70 year old male with respiratory distress. EXAM: PORTABLE CHEST 1 VIEW COMPARISON:  Chest radiograph dated 06/04/2020. FINDINGS: Feeding tube extends below the diaphragm. No interval change in the patchy left lung base density compared to prior radiograph. No large pleural effusion. No pneumothorax. Stable cardiomediastinal silhouette. Atherosclerotic calcification of the aorta. Median sternotomy wires and CABG vascular clips. No acute osseous pathology. Old healed right posterior rib fractures. IMPRESSION: No interval change. Electronically Signed   By: Elgie Collard M.D.   On: 06/06/2020 02:45   DG CHEST PORT 1 VIEW  Result Date: 06/04/2020 CLINICAL DATA:  Hypoxia EXAM: PORTABLE CHEST 1 VIEW COMPARISON:  May 31, 2020 FINDINGS: Mediastinal contour and cardiac silhouette are stable. Heart size is enlarged. Feeding tube is identified unchanged. Patchy consolidation of left lung base is unchanged. The right lung is clear. Old posttraumatic deformity of several right ribs are  unchanged. IMPRESSION: Patchy consolidation of left lung base unchanged. Electronically Signed   By: Sherian Rein M.D.   On: 06/04/2020 09:20   DG CHEST PORT 1 VIEW  Result Date: 05/31/2020 CLINICAL DATA:  Fever, altered mental status EXAM: PORTABLE CHEST 1 VIEW COMPARISON:  05/27/2020 FINDINGS: Feeding tube traverses esophagus into stomach. 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 Atherosclerotic calcification aorta. Mediastinal contours and pulmonary vascularity normal. Persistent infiltrate versus atelectasis at LEFT base. Remaining lungs clear. No pleural effusion or pneumothorax. Skin fold projects over LEFT upper chest. Old BILATERAL rib fractures. IMPRESSION: Persistent LEFT basilar opacity which could represent atelectasis or infiltrate. Electronically Signed  By: Ulyses SouthwardMark  Boles M.D.   On: 05/31/2020 08:44   DG Chest Port 1 View  Result Date: 05/27/2020 CLINICAL DATA:  Tachypnea EXAM: PORTABLE CHEST 1 VIEW COMPARISON:  05/20/2020 FINDINGS: Interval extubation. Enteric tube terminates within the stomach. Post CABG changes. Stable cardiomediastinal contours. Atherosclerotic calcification of the aortic knob. Minimal streaky left basilar opacity, likely atelectasis. No pleural effusion or pneumothorax. Remote right-sided rib fractures. IMPRESSION: Interval extubation.  Otherwise, no significant interval change. Electronically Signed   By: Duanne GuessNicholas  Plundo D.O.   On: 05/27/2020 18:09   DG CHEST PORT 1 VIEW  Result Date: 05/20/2020 CLINICAL DATA:  Evaluate ETT EXAM: PORTABLE CHEST 1 VIEW COMPARISON:  May 20, 2020 FINDINGS: The ETT terminates 1.5 cm above the carina. The feeding tube terminates below today's film. No pneumothorax. Mild atelectasis in the left base. No other acute abnormalities. IMPRESSION: 1. The ETT terminates 1.5 cm above the carina. Recommend withdrawing 1 cm. 2. Mild atelectasis and possibly a small layering effusion on the left, unchanged. These results will be called  to the ordering clinician or representative by the Radiologist Assistant, and communication documented in the PACS or [employer]. Electronically Signed   By: Gerome Samavid  Williams III M.D   On: 05/20/2020 12:13   DG CHEST PORT 1 VIEW  Result Date: 05/20/2020 CLINICAL DATA:  ETT placement EXAM: PORTABLE CHEST 1 VIEW COMPARISON:  None. FINDINGS: The ETT terminates near the thoracic inlet. Consider advancement. The feeding tube terminates in the stomach. No pneumothorax. The cardiomediastinal silhouette is unremarkable. Minimal atelectasis in the left base. Pulmonary infiltrates have otherwise resolved. No other acute abnormalities. IMPRESSION: 1. The ET tube is near the thoracic inlet.  Consider advancement. 2. A feeding tube terminates in the stomach. 3. Mild atelectasis in the left base. 4. No other acute abnormalities. Electronically Signed   By: Gerome Samavid  Williams III M.D   On: 05/20/2020 12:11   DG CHEST PORT 1 VIEW  Result Date: 05/17/2020 CLINICAL DATA:  Aspiration into airway EXAM: PORTABLE CHEST 1 VIEW COMPARISON:  05/12/2020 FINDINGS: Prior CABG. Cardiomegaly. Bilateral airspace opacities are noted, left greater than right. These have increased since prior study. No visible effusions or pneumothorax. Interval extubation. IMPRESSION: Worsening bilateral airspace disease, left greater than right concerning for pneumonia. Mild cardiomegaly. Electronically Signed   By: Charlett NoseKevin  Dover M.D.   On: 05/17/2020 10:48   DG Chest Port 1 View  Result Date: 05/12/2020 CLINICAL DATA:  Adjusted ET and OG tubes EXAM: PORTABLE CHEST 1 VIEW COMPARISON:  Radiograph 05/12/2020 FINDINGS: Endotracheal tube tip terminates in the mid trachea 4.4 cm from the carina. Transesophageal tube has been advanced now with the side port positioned beyond the GE junction and tip terminating near the gastric body. Stable postsurgical changes related to prior sternotomy and CABG. Cardiomediastinal contours are unremarkable. Some persistent  streaky opacities favoring atelectasis. No new consolidative opacities or sizable pleural effusions. No acute osseous or soft tissue abnormality. Degenerative changes are present in the imaged spine and shoulders. Telemetry leads overlie the chest. IMPRESSION: 1. Retraction of the endotracheal tube, now appropriately positioned in the mid trachea. 2. Interval advancement of transesophageal tube, tip now well positioned at the gastric body. 3. Persistent streaky opacities favoring atelectasis. Electronically Signed   By: Kreg ShropshirePrice  DeHay M.D.   On: 05/12/2020 06:45   DG CHEST PORT 1 VIEW  Result Date: 05/12/2020 CLINICAL DATA:  Intubation EXAM: PORTABLE CHEST 1 VIEW COMPARISON:  Radiograph 05/11/2020 FINDINGS: Endotracheal tube tip terminates low in the trachea, 1.5  cm from the carina. Consider retracting 2 cm to the mid trachea. Transesophageal tube side port is just distal to the GE junction with the tip below the margins of imaging. Telemetry leads and external support devices overlie the chest. Prior sternotomy. Stable cardiomediastinal contours accounting for differences in technique. Calcified, tortuous aorta. Some hazy and streaky opacities in the lungs are favored to reflect atelectatic change. No pneumothorax. No effusion. No acute osseous or soft tissue abnormality. Degenerative changes are present in the imaged spine and shoulders. IMPRESSION: 1. Endotracheal tube tip terminates low in the trachea, 1.5 cm from the carina. Consider retracting 2 cm to the mid trachea. 2. Transesophageal tube side port is just distal to the GE junction with the tip below the margins of imaging. 3. Hazy and streaky opacities in the lungs favored to reflect atelectasis. These results will be called to the ordering clinician or representative by the Radiologist Assistant, and communication documented in the PACS or [employer]. Electronically Signed   By: Kreg Shropshire M.D.   On: 05/12/2020 05:13   DG Chest Port 1  View  Result Date: 05/11/2020 CLINICAL DATA:  Altered level of consciousness EXAM: PORTABLE CHEST 1 VIEW COMPARISON:  02/26/2020 FINDINGS: Single frontal view of the chest demonstrates stable postsurgical changes from CABG. Cardiac silhouette is unremarkable. No airspace disease, effusion, or pneumothorax. Prior healed bilateral rib fractures. IMPRESSION: 1. No acute intrathoracic process. Electronically Signed   By: Sharlet Salina M.D.   On: 05/11/2020 20:29   DG Abd Portable 1V  Result Date: 05/17/2020 CLINICAL DATA:  Feeding tube placement. EXAM: PORTABLE ABDOMEN - 1 VIEW COMPARISON:  May 12, 2020. FINDINGS: The bowel gas pattern is normal. Distal tip of feeding tube is seen within the stomach. No radio-opaque calculi or other significant radiographic abnormality are seen. IMPRESSION: Distal tip of feeding tube seen within the stomach. No evidence of bowel obstruction or ileus. Electronically Signed   By: Lupita Raider M.D.   On: 05/17/2020 12:03   EEG adult  Result Date: 05/21/2020 Charlsie Quest, MD     05/22/2020  9:10 AM Patient Name: Artemio Dobie MRN: [id number] Epilepsy Attending: Charlsie Quest Referring Physician/Provider: Dr Marisue Humble Date: 05/21/2020 Duration: 24.01 mins Patient history: 70 year old male with right thalamic hemorrhage with intraventricular extension noted to have right gaze deviation.  EEG to evaluate for seizures. Level of alertness: comatose AEDs during EEG study: Keppra Technical aspects: This EEG study was done with scalp electrodes positioned according to the 10-20 International system of electrode placement. Electrical activity was acquired at a sampling rate of 500Hz  and reviewed with a high frequency filter of 70Hz  and a low frequency filter of 1Hz . EEG data were recorded continuously and digitally stored. Description: No clear posterior dominant rhythm was seen.  EEG showed continuous generalized polymorphic 3 to 6 Hz theta-delta slowing. Patient was noted to  multiple episodes where he was biting down on the tube. Concomitant EEG before, during and after the event did not show any EEG changes suggest seizure.  Hyperventilation and photic stimulation were not performed.   ABNORMALITY -Continuous slow, generalized IMPRESSION: This study is suggestive of moderate to severe diffuse encephalopathy, nonspecific etiology.  No seizures or epileptiform discharges were seen throughout the recording. 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 During mouth care the patient reportedly had up-gaze eye bobbing and what may have been hand automatisms and VS changes. During the event, ~15cc of fluid output from EVD EEG to evaluate for seizure. Level of alertness: comatose AEDs during EEG study: None Technical aspects: This EEG study was done with scalp electrodes positioned according to the 10-20 International system of electrode placement. Electrical activity was acquired at a sampling rate of 500Hz  and reviewed with a high frequency filter of 70Hz  and a low frequency filter of 1Hz . EEG data were recorded continuously and digitally stored. Description: EEG showed continuous generalized 3 to 6 Hz theta-delta slowing.  Hyperventilation and photic stimulation were not performed.   ABNORMALITY -Continuous slow, generalized IMPRESSION: This study is suggestive of moderate to evere diffuse encephalopathy, nonspecific etiology. No seizures or epileptiform discharges were seen throughout the recording.   ECHOCARDIOGRAM COMPLETE  Result Date: 05/12/2020    ECHOCARDIOGRAM REPORT   Patient Name:   KENYATTA KEIDEL Date of Exam: 05/12/2020 Medical Rec #:   05/14/2020  Height:       70.0 in Accession #:    Diana Eves Weight:       220.5 lb Date of Birth:  06-08-1950  BSA:          2.176 m Patient Age:    70 years   BP:           94/68 mmHg Patient Gender: M          HR:           61 bpm. Exam Location:  Inpatient Procedure: 2D Echo, Cardiac Doppler and Color Doppler Indications:    Stroke I63.9  History:        Patient has prior history of Echocardiogram examinations, most                 recent 02/27/2020. Previous Myocardial Infarction and CAD,                 Stroke; Risk Factors:Diabetes and Hypertension.  Sonographer:    05/09/1950 RDCS Referring Phys: 02-12-1980 SRISHTI L BHAGAT IMPRESSIONS  1. Left ventricular ejection fraction, by estimation, is 45 to 50%. The left ventricle has mildly decreased function. The left ventricle has no regional wall motion abnormalities. Left ventricular diastolic parameters are indeterminate.  2. Right ventricular systolic function is normal. The right ventricular size is normal. There is normal pulmonary artery systolic pressure.  3. The mitral valve is normal in structure. No evidence of mitral valve regurgitation. No evidence of mitral stenosis.  4. The aortic valve is normal in structure. There is mild calcification of the aortic valve. There is moderate thickening of the aortic valve. Aortic valve regurgitation is not visualized. Mild to moderate aortic valve sclerosis/calcification is present, without any evidence of aortic stenosis.  5. The inferior vena cava is normal in size with greater than 50% respiratory variability, suggesting right atrial pressure of 3 mmHg. Conclusion(s)/Recommendation(s): No intracardiac source of embolism detected on this transthoracic study. A transesophageal echocardiogram is recommended to exclude cardiac source of embolism if clinically indicated. FINDINGS  Left Ventricle: Left ventricular ejection fraction, by estimation, is 45 to 50%. The left ventricle has mildly decreased function. The  left ventricle has no regional wall motion abnormalities. The left ventricular internal cavity size was normal in size. There is no left ventricular  hypertrophy. Left ventricular diastolic parameters are indeterminate. Right Ventricle: The right ventricular size is normal. No increase in right ventricular wall thickness. Right ventricular systolic function is normal. There is normal pulmonary artery systolic pressure. The tricuspid regurgitant velocity is 1.90 m/s, and  with an assumed right atrial pressure of 3 mmHg, the estimated right ventricular systolic pressure is 17.4 mmHg. Left Atrium: Left atrial size was normal in size. Right Atrium: Right atrial size was normal in size. Pericardium: There is no evidence of pericardial effusion. Mitral Valve: The mitral valve is normal in structure. Mild mitral annular calcification. No evidence of mitral valve regurgitation. No evidence of mitral valve stenosis. Tricuspid Valve: The tricuspid valve is normal in structure. Tricuspid valve regurgitation is mild . No evidence of tricuspid stenosis. Aortic Valve: The aortic valve is normal in structure. There is mild calcification of the aortic valve. There is moderate thickening of the aortic valve. Aortic valve regurgitation is not visualized. Mild to moderate aortic valve sclerosis/calcification is present, without any evidence of aortic stenosis. Pulmonic Valve: The pulmonic valve was normal in structure. Pulmonic valve regurgitation is not visualized. No evidence of pulmonic stenosis. Aorta: The aortic root is normal in size and structure. Venous: The inferior vena cava is normal in size with greater than 50% respiratory variability, suggesting right atrial pressure of 3 mmHg. IAS/Shunts: No atrial level shunt detected by color flow Doppler.  LEFT VENTRICLE PLAX 2D LVIDd:         4.40 cm     Diastology LVIDs:         3.40 cm     LV e' medial:    4.78 cm/s LV PW:         0.90 cm     LV E/e' medial:  17.6 LV IVS:         0.90 cm     LV e' lateral:   8.16 cm/s LVOT diam:     2.10 cm     LV E/e' lateral: 10.3 LV SV:         65 LV SV Index:   30 LVOT Area:     3.46 cm  LV Volumes (MOD) LV vol d, MOD A2C: 65.5 ml LV vol d, MOD A4C: 81.6 ml LV vol s, MOD A2C: 31.1 ml LV vol s, MOD A4C: 40.8 ml LV SV MOD A2C:     34.4 ml LV SV MOD A4C:     81.6 ml LV SV MOD BP:      40.1 ml RIGHT VENTRICLE RV S prime:     3.49 cm/s TAPSE (M-mode): 0.7 cm LEFT ATRIUM             Index       RIGHT ATRIUM           Index LA diam:        4.50 cm 2.07 cm/m  RA Area:     20.80 cm LA Vol (A2C):   47.3 ml 21.74 ml/m RA Volume:   60.90 ml  27.99 ml/m LA Vol (A4C):   35.0 ml 16.09 ml/m LA Biplane Vol: 40.7 ml 18.71 ml/m  AORTIC VALVE LVOT Vmax:   101.00 cm/s LVOT Vmean:  65.000 cm/s LVOT VTI:    0.189 m  AORTA Ao Root diam: 3.70 cm Ao Asc diam:  3.30 cm MITRAL VALVE               TRICUSPID VALVE MV Area (PHT): 3.21 cm    TR Peak grad:  14.4 mmHg MV Decel Time: 236 msec    TR Vmax:        190.00 cm/s MV E velocity: 84.30 cm/s MV A velocity: 33.60 cm/s  SHUNTS MV E/A ratio:  2.51        Systemic VTI:  0.19 m                            Systemic Diam: 2.10 cm Donato Schultz MD Electronically signed by Donato Schultz MD Signature Date/Time: 05/12/2020/1:59:56 PM    Final    ECHOCARDIOGRAM LIMITED BUBBLE STUDY  Result Date: 06/04/2020    ECHOCARDIOGRAM LIMITED REPORT   Patient Name:   LAYSON BERTSCH Date of Exam: 06/04/2020 Medical Rec #:  [id number]  Height:       70.0 in Accession #:    [id number] Weight:       222.4 lb Date of Birth:  1950/11/26  BSA:          2.184 m Patient Age:    70 years   BP:           103/65 mmHg Patient Gender: M          HR:           77 bpm. Exam Location:  Inpatient Procedure: Limited Echo and Saline Contrast Bubble Study Indications:    evaluation to r/o endocarditis  History:        Patient has prior history of Echocardiogram examinations, most                 recent 05/12/2020. CAD and Previous Myocardial Infarction,                  Stroke; Risk Factors:Hypertension and Diabetes. Can do limited                 with focus on valves evaluation to r/o endocarditis.  Sonographer:    Celesta Gentile RCS Referring Phys: [id number] TRUNG T VU IMPRESSIONS  1. Left ventricular ejection fraction, by estimation, is 60 to 65%. The left ventricle has normal function. The left ventricle has no regional wall motion abnormalities.  2. Right ventricular systolic function is normal. The right ventricular size is normal.  3. The mitral valve is degenerative. No evidence of mitral valve regurgitation. No evidence of mitral stenosis.  4. The aortic valve is calcified. There is moderate calcification of the aortic valve. There is moderate thickening of the aortic valve. Aortic valve regurgitation is not visualized. Mild to moderate aortic valve sclerosis/calcification is present, without any evidence of aortic stenosis.  5. The inferior vena cava is normal in size with greater than 50% respiratory variability, suggesting right atrial pressure of 3 mmHg.  6. Agitated saline contrast bubble study was negative, with no evidence of any interatrial shunt.  7. No obvious vegetation noted but if clinical suspcion high, recommend TEE. FINDINGS  Left Ventricle: Left ventricular ejection fraction, by estimation, is 60 to 65%. The left ventricle has normal function. The left ventricle has no regional wall motion abnormalities. The left ventricular internal cavity size was normal in size. There is  no left ventricular hypertrophy. Right Ventricle: The right ventricular size is normal. No increase in right ventricular wall thickness. Right ventricular systolic function is normal. Left Atrium: Left atrial size was normal in size. Right Atrium: Right atrial size was normal in size. Pericardium: There is no evidence of pericardial effusion. Mitral Valve: The mitral valve is degenerative  in appearance. There is mild thickening of the anterior mitral valve leaflet(s). There is mild  calcification of the anterior mitral valve leaflet(s). Mild mitral annular calcification. No evidence of mitral valve stenosis. Tricuspid Valve: The tricuspid valve is normal in structure. Tricuspid valve regurgitation is not demonstrated. No evidence of tricuspid stenosis. Aortic Valve: The aortic valve is calcified. There is moderate calcification of the aortic valve. There is moderate thickening of the aortic valve. Aortic valve regurgitation is not visualized. Mild to moderate aortic valve sclerosis/calcification is present, without any evidence of aortic stenosis. Pulmonic Valve: The pulmonic valve was normal in structure. Pulmonic valve regurgitation is not visualized. No evidence of pulmonic stenosis. Aorta: The aortic root is normal in size and structure. Venous: The inferior vena cava is normal in size with greater than 50% respiratory variability, suggesting right atrial pressure of 3 mmHg. IAS/Shunts: No atrial level shunt detected by color flow Doppler. Agitated saline contrast was given intravenously to evaluate for intracardiac shunting. Agitated saline contrast bubble study was negative, with no evidence of any interatrial shunt. Armanda Magic MD Electronically signed by Armanda Magic MD Signature Date/Time: 06/04/2020/2:53:15 PM    Final    VAS [place] LOWER EXTREMITY VENOUS (DVT)  Result Date: 06/07/2020  Lower Venous DVT Study Indications: Swelling LT>RT.  Anticoagulation: Recently discontinued eliquis due to active bleed. Comparison Study: No prior studies. Performing Technologist: Jean Rosenthal RDMS,RVT  Examination Guidelines: A complete evaluation includes B-mode imaging, spectral Doppler, color Doppler, and power Doppler as needed of all accessible portions of each vessel. Bilateral testing is considered an integral part of a complete examination. Limited examinations for reoccurring indications may be performed as noted. The reflux portion of the exam is performed with the patient in reverse  Trendelenburg.  +---------+---------------+---------+-----------+----------+-------------------+ RIGHT    CompressibilityPhasicitySpontaneityPropertiesThrombus Aging      +---------+---------------+---------+-----------+----------+-------------------+ CFV      Partial        Yes      Yes                  Acute               +---------+---------------+---------+-----------+----------+-------------------+ SFJ      Partial        Yes      Yes                  Acute               +---------+---------------+---------+-----------+----------+-------------------+ FV Prox  Partial        Yes      Yes                  Acute               +---------+---------------+---------+-----------+----------+-------------------+ FV Mid   Full           Yes      Yes                                      +---------+---------------+---------+-----------+----------+-------------------+ FV DistalFull                                                             +---------+---------------+---------+-----------+----------+-------------------+ PFV  Partial        Yes      Yes                  Acute               +---------+---------------+---------+-----------+----------+-------------------+ POP      Full           Yes      Yes                                      +---------+---------------+---------+-----------+----------+-------------------+ PTV      Full                                                             +---------+---------------+---------+-----------+----------+-------------------+ PERO                                                  Not well visualized +---------+---------------+---------+-----------+----------+-------------------+ GSV      Partial        Yes      Yes                  Acute               +---------+---------------+---------+-----------+----------+-------------------+ EIV                     Yes      Yes                                       +---------+---------------+---------+-----------+----------+-------------------+   +---------+---------------+---------+-----------+----------+-------------------+ LEFT     CompressibilityPhasicitySpontaneityPropertiesThrombus Aging      +---------+---------------+---------+-----------+----------+-------------------+ CFV      None           No       No                   Acute               +---------+---------------+---------+-----------+----------+-------------------+ SFJ      None                                         Acute               +---------+---------------+---------+-----------+----------+-------------------+ FV Prox  None           No       No                   Acute               +---------+---------------+---------+-----------+----------+-------------------+ FV Mid                  No       No                   Acute               +---------+---------------+---------+-----------+----------+-------------------+  FV Distal               No       No                   Acute               +---------+---------------+---------+-----------+----------+-------------------+ PFV      None           No       No                   Acute               +---------+---------------+---------+-----------+----------+-------------------+ POP                     No       No                   Acute               +---------+---------------+---------+-----------+----------+-------------------+ PTV      None           No       No                   Acute               +---------+---------------+---------+-----------+----------+-------------------+ PERO                                                  Not well visualized +---------+---------------+---------+-----------+----------+-------------------+ Gastroc  None           No       No                                        +---------+---------------+---------+-----------+----------+-------------------+ EIV      None           No       No                                       +---------+---------------+---------+-----------+----------+-------------------+    Summary: RIGHT: - Findings consistent with acute deep vein thrombosis involving the right common femoral vein, SF junction, right femoral vein, and right proximal profunda vein. - No cystic structure found in the popliteal fossa. - Common femoral vein obstruction doesn't appear to extend above inguinal ligament.  LEFT: - Findings consistent with acute deep vein thrombosis involving the left common femoral vein, SF junction, left femoral vein, left proximal profunda vein, left popliteal vein, left posterior tibial veins, left gastrocnemius veins, and EIV. - No cystic structure found in the popliteal fossa. - Possible obstruction proximal to the inguinal ligament.  *See table(s) above for measurements and observations. Electronically signed by Gretta Began MD on 06/07/2020 at 2:05:21 PM.    Final    CT ANGIO HEAD CODE STROKE  Result Date: 05/21/2020 CLINICAL DATA:  Follow-up examination for right thalamic hemorrhage with intraventricular extension, now with right gaze deviation. EXAM: CT ANGIOGRAPHY HEAD AND NECK TECHNIQUE: Multidetector CT imaging of the head and neck was performed using the standard protocol during bolus administration of intravenous  contrast. Multiplanar CT image reconstructions and MIPs were obtained to evaluate the vascular anatomy. Carotid stenosis measurements (when applicable) are obtained utilizing NASCET criteria, using the distal internal carotid diameter as the denominator. CONTRAST:  75mL OMNIPAQUE IOHEXOL 350 MG/ML SOLN COMPARISON:  Prior CT from earlier the same day as well as multiple previous exams. 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 Previously seen left thalamic hemorrhage has resolved. Residual subacute intraventricular blood products seen layering within the occipital horns, similar to previous. Stable ventriculomegaly. Sequelae of prior right frontal ventriculostomy with trace residual hemorrhage along the catheter tract. No new or acute intracranial hemorrhage. No visible acute large vessel territory infarct. No mass lesion or midline shift. No extra-axial fluid collection. Vascular: No hyperdense vessel. Calcified atherosclerosis present at the skull base. Skull: Soft tissue swelling with overlying skin staple noted at the right parietal scalp. Prior right frontal burr hole for ventriculostomy. Sinuses: Patient is intubated with enteric and endotracheal tubes partially visualized. Associated sphenoid ethmoidal sinus disease with trace bilateral pleural effusions. Orbits: Right gaze noted. Globes and orbital soft tissues demonstrate no other acute finding. Review of the MIP images confirms the above findings CTA NECK FINDINGS Aortic arch: Visualized aortic arch of normal caliber with normal branch pattern. Mild atheromatous change about the arch and origin of the great vessels without significant stenosis. Right carotid system: Right CCA patent from its origin to the bifurcation without stenosis. Atheromatous plaque about the right carotid bulb and proximal right ICA without significant stenosis. Right ICA widely patent distally without stenosis, dissection, or occlusion. Left carotid system: Left CCA patent from its origin to the bifurcation without stenosis. Eccentric mixed plaque at the left carotid bulb/proximal left ICA without significant stenosis. Left ICA patent distally without stenosis, dissection or occlusion. Vertebral arteries: Both vertebral arteries arise from subclavian arteries. No proximal subclavian artery stenosis. Atheromatous plaque at the origin of the left vertebral artery  with moderate stenosis. Plaque at the origin of the right vertebral artery with associated more severe ostial stenosis. Vertebral arteries otherwise patent within the neck. Left vertebral artery dominant. Skeleton: No visible acute osseous finding. No discrete or worrisome osseous lesions. Other neck: Endotracheal enteric tubes in place. No other acute soft tissue abnormality. No mass or adenopathy. Few calcified thyroid nodules measure up to 7 mm, felt to be of doubtful significance given size and patient age. No follow-up imaging recommended regarding these lesions (ref: J Am Coll Radiol. 2015 Feb;12(2): 143-50). Upper chest: Small amount of layering secretions within the subglottic trachea just distal to the endotracheal tube. Small layering left pleural effusion. Review of the MIP images confirms the above findings CTA HEAD FINDINGS Anterior circulation: Petrous segments patent bilaterally. Extensive calcified plaque throughout the carotid siphons with associated moderate multifocal narrowing. A1 segments patent bilaterally. Right A1 hypoplastic, accounting for the slightly diminutive right ICA is compared to the left. Normal anterior communicating artery complex. Anterior cerebral arteries widely patent proximally. Short-segment mild to moderate distal left 8 3 stenosis noted (series 11, image 55). No M1 stenosis or occlusion. Normal MCA bifurcations. Distal MCA branches well perfused and symmetric. Posterior circulation: Hypoplastic right vertebral artery terminates in PICA. Multifocal atheromatous plaque seen within the dominant left V4 segment and proximal basilar artery with associated mild to moderate multifocal stenoses. Basilar otherwise patent to its distal aspect. Right PICA patent. Left PICA not seen. Superior cerebellar arteries patent  bilaterally. Both PCAs primarily supplied via the basilar and are widely patent to their distal aspects. Venous sinuses: Not well assessed due to timing the contrast  bolus. Anatomic variants: Hypoplastic right vertebral artery terminates in PICA. No aneurysm. Review of the MIP images confirms the above findings IMPRESSION: CT HEAD IMPRESSION: 1. Stable head CT.  No new acute intracranial abnormality. 2. Residual subacute intraventricular hemorrhage with stable ventriculomegaly and sequelae of prior right frontal ventriculostomy. 3. Underlying atrophy with chronic microvascular ischemic disease. CTA HEAD AND NECK IMPRESSION: 1. Negative CTA for large vessel occlusion. 2. Extensive atheromatous plaque throughout the carotid siphons with associated moderate multifocal narrowing. 3. Moderate stenosis at the origin of the left vertebral artery, with more severe narrowing at the origin of the right vertebral artery. Left vertebral artery dominant. Hypoplastic right vertebral artery terminates in PICA. 4. Atheromatous plaque involving the dominant left V4 segment and proximal basilar artery with associated mild to moderate multifocal stenoses. 5. Layering left pleural effusion. Electronically Signed   By: Rise Mu M.D.   On: 05/21/2020 22:35   CT ANGIO NECK CODE STROKE  Result Date: 05/21/2020 CLINICAL DATA:  Follow-up examination for right thalamic hemorrhage with intraventricular extension, now with right gaze deviation. EXAM: CT ANGIOGRAPHY HEAD AND NECK TECHNIQUE: Multidetector CT imaging of the head and neck was performed using the standard protocol during bolus administration of intravenous contrast. Multiplanar CT image reconstructions and MIPs were obtained to evaluate the vascular anatomy. Carotid stenosis measurements (when applicable) are obtained utilizing NASCET criteria, using the distal internal carotid diameter as the denominator. CONTRAST:  75mL OMNIPAQUE IOHEXOL 350 MG/ML SOLN COMPARISON:  Prior CT from earlier the same day as well as multiple previous exams. FINDINGS: CT HEAD FINDINGS Brain: Atrophy with chronic small vessel ischemic disease, with  scattered remote lacunar infarcts about the left thalamus and right corona radiata, stable. Previously seen left thalamic hemorrhage has resolved. Residual subacute intraventricular blood products seen layering within the occipital horns, similar to previous. Stable ventriculomegaly. Sequelae of prior right frontal ventriculostomy with trace residual hemorrhage along the catheter tract. No new or acute intracranial hemorrhage. No visible acute large vessel territory infarct. No mass lesion or midline shift. No extra-axial fluid collection. Vascular: No hyperdense vessel. Calcified atherosclerosis present at the skull base. Skull: Soft tissue swelling with overlying skin staple noted at the right parietal scalp. Prior right frontal burr hole for ventriculostomy. Sinuses: Patient is intubated with enteric and endotracheal tubes partially visualized. Associated sphenoid ethmoidal sinus disease with trace bilateral pleural effusions. Orbits: Right gaze noted. Globes and orbital soft tissues demonstrate no other acute finding. Review of the MIP images confirms the above findings CTA NECK FINDINGS Aortic arch: Visualized aortic arch of normal caliber with normal branch pattern. Mild atheromatous change about the arch and origin of the great vessels without significant stenosis. Right carotid system: Right CCA patent from its origin to the bifurcation without stenosis. Atheromatous plaque about the right carotid bulb and proximal right ICA without significant stenosis. Right ICA widely patent distally without stenosis, dissection, or occlusion. Left carotid system: Left CCA patent from its origin to the bifurcation without stenosis. Eccentric mixed plaque at the left carotid bulb/proximal left ICA without significant stenosis. Left ICA patent distally without stenosis, dissection or occlusion. Vertebral arteries: Both vertebral arteries arise from subclavian arteries. No proximal subclavian artery stenosis. Atheromatous  plaque at the origin of the left vertebral artery with moderate stenosis. Plaque at the origin of the right vertebral artery with associated more  severe ostial stenosis. Vertebral arteries otherwise patent within the neck. Left vertebral artery dominant. Skeleton: No visible acute osseous finding. No discrete or worrisome osseous lesions. Other neck: Endotracheal enteric tubes in place. No other acute soft tissue abnormality. No mass or adenopathy. Few calcified thyroid nodules measure up to 7 mm, felt to be of doubtful significance given size and patient age. No follow-up imaging recommended regarding these lesions (ref: J Am Coll Radiol. 2015 Feb;12(2): 143-50). Upper chest: Small amount of layering secretions within the subglottic trachea just distal to the endotracheal tube. Small layering left pleural effusion. Review of the MIP images confirms the above findings CTA HEAD FINDINGS Anterior circulation: Petrous segments patent bilaterally. Extensive calcified plaque throughout the carotid siphons with associated moderate multifocal narrowing. A1 segments patent bilaterally. Right A1 hypoplastic, accounting for the slightly diminutive right ICA is compared to the left. Normal anterior communicating artery complex. Anterior cerebral arteries widely patent proximally. Short-segment mild to moderate distal left 8 3 stenosis noted (series 11, image 55). No M1 stenosis or occlusion. Normal MCA bifurcations. Distal MCA branches well perfused and symmetric. Posterior circulation: Hypoplastic right vertebral artery terminates in PICA. Multifocal atheromatous plaque seen within the dominant left V4 segment and proximal basilar artery with associated mild to moderate multifocal stenoses. Basilar otherwise patent to its distal aspect. Right PICA patent. Left PICA not seen. Superior cerebellar arteries patent bilaterally. Both PCAs primarily supplied via the basilar and are widely patent to their distal aspects. Venous  sinuses: Not well assessed due to timing the contrast bolus. Anatomic variants: Hypoplastic right vertebral artery terminates in PICA. No aneurysm. Review of the MIP images confirms the above findings IMPRESSION: CT HEAD IMPRESSION: 1. Stable head CT.  No new acute intracranial abnormality. 2. Residual subacute intraventricular hemorrhage with stable ventriculomegaly and sequelae of prior right frontal ventriculostomy. 3. Underlying atrophy with chronic microvascular ischemic disease. CTA HEAD AND NECK IMPRESSION: 1. Negative CTA for large vessel occlusion. 2. Extensive atheromatous plaque throughout the carotid siphons with associated moderate multifocal narrowing. 3. Moderate stenosis at the origin of the left vertebral artery, with more severe narrowing at the origin of the right vertebral artery. Left vertebral artery dominant. Hypoplastic right vertebral artery terminates in PICA. 4. Atheromatous plaque involving the dominant left V4 segment and proximal basilar artery with associated mild to moderate multifocal stenoses. 5. Layering left pleural effusion. Electronically Signed   By: Rise Mu M.D.   On: 05/21/2020 22:35    (Echo, Carotid, EGD, Colonoscopy, ERCP)    Subjective: Nonverbal  Discharge Exam: Vitals:   06/09/20 2324 06/10/20 0828  BP: (!) 144/70 109/85  Pulse: (!) 143 (!) 127  Resp: (!) 22 20  Temp: (!) 100.5 F (38.1 C) 98.3 F (36.8 C)  SpO2: 98%    Vitals:   06/09/20 0805 06/09/20 2324 06/09/20 2324 06/10/20 0828  BP: 130/74 (!) 144/70 (!) 144/70 109/85  Pulse: (!) 143 (!) 143 (!) 143 (!) 127  Resp: (!) 24 (!) 22 (!) 22 20  Temp: 98.7 F (37.1 C) (!) 100.5 F (38.1 C) (!) 100.5 F (38.1 C) 98.3 F (36.8 C)  TempSrc: Oral Axillary Oral Axillary  SpO2: 98% 98% 98%   Weight:      Height:        General: Pt is alert, awake, not in acute distress Cardiovascular: RRR, S1/S2 +, no rubs, no gallops Respiratory: CTA bilaterally, no wheezing, no  rhonchi Abdominal: Soft, NT, ND, bowel sounds + Extremities: no edema, no cyanosis  The results of significant diagnostics from this hospitalization (including imaging, microbiology, ancillary and laboratory) are listed below for reference.     Microbiology: Recent Results (from the past 240 hour(s))  MRSA PCR Screening     Status: None   Collection Time: 05/31/20  3:52 PM   Specimen: Nasal Mucosa; Nasopharyngeal  Result Value Ref Range Status   MRSA by PCR NEGATIVE NEGATIVE Final    Comment:        The GeneXpert MRSA Assay (FDA approved for NASAL specimens only), is one component of a comprehensive MRSA colonization surveillance program. It is not intended to diagnose MRSA infection nor to guide or monitor treatment for MRSA infections. Performed at [facility], [address] [place], [place] [address]   Culture, blood (Routine X 2) w Reflex to ID Panel     Status: Abnormal   Collection Time: 06/02/20 10:08 AM   Specimen: BLOOD  Result Value Ref Range Status   Specimen Description BLOOD LEFT ANTECUBITAL  Final   Special Requests   Final    BOTTLES DRAWN AEROBIC ONLY Blood Culture adequate volume   Culture  Setup Time   Final    YEAST AEROBIC BOTTLE ONLY CRITICAL VALUE NOTED.  VALUE IS CONSISTENT WITH PREVIOUSLY REPORTED AND CALLED VALUE. Performed at [facility], [address] [place], [place] [address]    Culture CANDIDA GLABRATA (A)  Final   Report Status 06/07/2020 FINAL  Final  Culture, blood (Routine X 2) w Reflex to ID Panel     Status: Abnormal   Collection Time: 06/02/20 10:08 AM   Specimen: BLOOD LEFT HAND  Result Value Ref Range Status   Specimen Description BLOOD LEFT HAND  Final   Special Requests   Final    BOTTLES DRAWN AEROBIC ONLY Blood Culture adequate volume   Culture  Setup Time   Final    YEAST WITH PSEUDOHYPHAE AEROBIC BOTTLE ONLY CRITICAL RESULT CALLED TO, READ BACK BY AND VERIFIED WITH: J. Eden Emms, AT [id number] 06/05/20  Renato Shin Performed at [facility], [address], [place], [place] [address]    Culture CANDIDA GLABRATA (A)  Final   Report Status 06/07/2020 FINAL  Final  Culture, blood (routine x 2)     Status: Abnormal   Collection Time: 06/04/20  3:19 PM   Specimen: BLOOD  Result Value Ref Range Status   Specimen Description BLOOD LEFT ANTECUBITAL  Final   Special Requests   Final    BOTTLES DRAWN AEROBIC AND ANAEROBIC Blood Culture results may not be optimal due to an inadequate volume of blood received in culture bottles   Culture  Setup Time   Final    GRAM POSITIVE COCCI IN CLUSTERS ANAEROBIC BOTTLE ONLY CRITICAL RESULT CALLED TO, READ BACK BY AND VERIFIED WITH: PHARMD JESSICA C 2000 [id number] FCP    Culture (A)  Final    STAPHYLOCOCCUS EPIDERMIDIS THE SIGNIFICANCE OF ISOLATING THIS ORGANISM FROM A SINGLE SET OF BLOOD CULTURES WHEN MULTIPLE SETS ARE DRAWN IS UNCERTAIN. PLEASE NOTIFY THE MICROBIOLOGY DEPARTMENT WITHIN ONE WEEK IF SPECIATION AND SENSITIVITIES ARE REQUIRED. Performed at [facility], [address], [place], [place] [address]    Report Status 06/07/2020 FINAL  Final  Blood Culture ID Panel (Reflexed)     Status: Abnormal   Collection Time: 06/04/20  3:19 PM  Result Value Ref Range Status   Enterococcus faecalis NOT DETECTED NOT DETECTED Final   Enterococcus Faecium NOT DETECTED NOT DETECTED  Final   Listeria monocytogenes NOT DETECTED NOT DETECTED Final   Staphylococcus species DETECTED (A) NOT DETECTED Final    Comment: CRITICAL RESULT CALLED TO, READ BACK BY AND VERIFIED WITH: PHARMD JESSICA C 2000 [id number] FCP    Staphylococcus aureus (BCID) NOT DETECTED NOT DETECTED Final   Staphylococcus epidermidis DETECTED (A) NOT DETECTED Final    Comment: Methicillin (oxacillin) resistant coagulase negative staphylococcus. Possible blood culture contaminant (unless isolated from more than one blood culture draw or clinical case suggests pathogenicity). No antibiotic  treatment is indicated for blood  culture contaminants. CRITICAL RESULT CALLED TO, READ BACK BY AND VERIFIED WITH: PHARMD JESSICA C 2000 [id number] FCP    Staphylococcus lugdunensis NOT DETECTED NOT DETECTED Final   Streptococcus species NOT DETECTED NOT DETECTED Final   Streptococcus agalactiae NOT DETECTED NOT DETECTED Final   Streptococcus pneumoniae NOT DETECTED NOT DETECTED Final   Streptococcus pyogenes NOT DETECTED NOT DETECTED Final   A.calcoaceticus-baumannii NOT DETECTED NOT DETECTED Final   Bacteroides fragilis NOT DETECTED NOT DETECTED Final   Enterobacterales NOT DETECTED NOT DETECTED Final   Enterobacter cloacae complex NOT DETECTED NOT DETECTED Final   Escherichia coli NOT DETECTED NOT DETECTED Final   Klebsiella aerogenes NOT DETECTED NOT DETECTED Final   Klebsiella oxytoca NOT DETECTED NOT DETECTED Final   Klebsiella pneumoniae NOT DETECTED NOT DETECTED Final   Proteus species NOT DETECTED NOT DETECTED Final   Salmonella species NOT DETECTED NOT DETECTED Final   Serratia marcescens NOT DETECTED NOT DETECTED Final   Haemophilus influenzae NOT DETECTED NOT DETECTED Final   Neisseria meningitidis NOT DETECTED NOT DETECTED Final   Pseudomonas aeruginosa NOT DETECTED NOT DETECTED Final   Stenotrophomonas maltophilia NOT DETECTED NOT DETECTED Final   Candida albicans NOT DETECTED NOT DETECTED Final   Candida auris NOT DETECTED NOT DETECTED Final   Candida glabrata NOT DETECTED NOT DETECTED Final   Candida krusei NOT DETECTED NOT DETECTED Final   Candida parapsilosis NOT DETECTED NOT DETECTED Final   Candida tropicalis NOT DETECTED NOT DETECTED Final   Cryptococcus neoformans/gattii NOT DETECTED NOT DETECTED Final   Methicillin resistance mecA/C DETECTED (A) NOT DETECTED Final    Comment: CRITICAL RESULT CALLED TO, READ BACK BY AND VERIFIED WITH: PHARMD JESSICA C 2000 [id number] FCP Performed at [facility], [address] [place], [place] [address]   Culture, blood  (routine x 2)     Status: None   Collection Time: 06/04/20  3:20 PM   Specimen: BLOOD RIGHT HAND  Result Value Ref Range Status   Specimen Description BLOOD RIGHT HAND  Final   Special Requests   Final    BOTTLES DRAWN AEROBIC ONLY Blood Culture results may not be optimal due to an inadequate volume of blood received in culture bottles   Culture   Final    NO GROWTH 5 DAYS Performed at [facility], [address], [place], [place] [address]    Report Status 06/09/2020 FINAL  Final     Labs: BNP (last 3 results) Recent Labs    05/17/20 1222  BNP 662.1*   Basic Metabolic Panel: Recent Labs  Lab 06/04/20 0326 06/05/20 0433 06/06/20 0435 06/07/20 0419 06/08/20 1018  NA 146* 146* 149* 150* 155*  K 3.5 3.5 3.0* 3.0* 3.0*  CL 114* 116* 119* 121* 121*  CO2 21* 23 21* 22 24  GLUCOSE 154* 254* 199* 210* 217*  BUN 51* 53* 53* 58* 67*  CREATININE 1.85* 1.79* 1.79* 1.89* 2.20*  CALCIUM 7.9*  8.0* 8.0* 8.2* 8.1*   Liver Function Tests: Recent Labs  Lab 06/05/20 0433  AST 69*  ALT 127*  ALKPHOS 385*  BILITOT 0.7  PROT 5.8*  ALBUMIN 1.7*   No results for input(s): LIPASE, AMYLASE in the last 168 hours. No results for input(s): AMMONIA in the last 168 hours. CBC: Recent Labs  Lab 06/04/20 0326 06/05/20 0433 06/06/20 0435 06/07/20 0419  WBC 7.7 7.1 9.1 10.1  HGB 12.7* 14.7 12.8* 12.8*  HCT 39.6 44.7 39.5 40.1  MCV 86.7 85.6 85.9 88.3  PLT 58* 43* 65* 82*   Cardiac Enzymes: No results for input(s): CKTOTAL, CKMB, CKMBINDEX, TROPONINI in the last 168 hours. BNP: Invalid input(s): POCBNP CBG: Recent Labs  Lab 06/07/20 2005 06/07/20 2308 06/08/20 0318 06/08/20 0812 06/08/20 1301  GLUCAP 193* 210* 192* 193* 180*   D-Dimer No results for input(s): DDIMER in the last 72 hours. Hgb A1c No results for input(s): HGBA1C in the last 72 hours. Lipid Profile No results for input(s): CHOL, HDL, LDLCALC, TRIG, CHOLHDL, LDLDIRECT in the last 72 hours. Thyroid  function studies No results for input(s): TSH, T4TOTAL, T3FREE, THYROIDAB in the last 72 hours.  Invalid input(s): FREET3 Anemia work up No results for input(s): VITAMINB12, FOLATE, FERRITIN, TIBC, IRON, RETICCTPCT in the last 72 hours. Urinalysis    Component Value Date/Time   COLORURINE YELLOW 05/11/2020 2100   APPEARANCEUR HAZY (A) 05/11/2020 2100   LABSPEC 1.011 05/11/2020 2100   PHURINE 7.0 05/11/2020 2100   GLUCOSEU 50 (A) 05/11/2020 2100   HGBUR LARGE (A) 05/11/2020 2100   BILIRUBINUR NEGATIVE 05/11/2020 2100   KETONESUR 20 (A) 05/11/2020 2100   PROTEINUR 100 (A) 05/11/2020 2100   NITRITE NEGATIVE 05/11/2020 2100   LEUKOCYTESUR MODERATE (A) 05/11/2020 2100   Sepsis Labs Invalid input(s): PROCALCITONIN,  WBC,  LACTICIDVEN Microbiology Recent Results (from the past 240 hour(s))  MRSA PCR Screening     Status: None   Collection Time: 05/31/20  3:52 PM   Specimen: Nasal Mucosa; Nasopharyngeal  Result Value Ref Range Status   MRSA by PCR NEGATIVE NEGATIVE Final    Comment:        The GeneXpert MRSA Assay (FDA approved for NASAL specimens only), is one component of a comprehensive MRSA colonization surveillance program. It is not intended to diagnose MRSA infection nor to guide or monitor treatment for MRSA infections. Performed at [facility], [address], [place], [place] [address]   Culture, blood (Routine X 2) w Reflex to ID Panel     Status: Abnormal   Collection Time: 06/02/20 10:08 AM   Specimen: BLOOD  Result Value Ref Range Status   Specimen Description BLOOD LEFT ANTECUBITAL  Final   Special Requests   Final    BOTTLES DRAWN AEROBIC ONLY Blood Culture adequate volume   Culture  Setup Time   Final    YEAST AEROBIC BOTTLE ONLY CRITICAL VALUE NOTED.  VALUE IS CONSISTENT WITH PREVIOUSLY REPORTED AND CALLED VALUE. Performed at [facility], [address], [place], [place] [address]    Culture CANDIDA GLABRATA (A)  Final   Report Status  06/07/2020 FINAL  Final  Culture, blood (Routine X 2) w Reflex to ID Panel     Status: Abnormal   Collection Time: 06/02/20 10:08 AM   Specimen: BLOOD LEFT HAND  Result Value Ref Range Status   Specimen Description BLOOD LEFT HAND  Final   Special Requests   Final    BOTTLES DRAWN AEROBIC  ONLY Blood Culture adequate volume   Culture  Setup Time   Final    YEAST WITH PSEUDOHYPHAE AEROBIC BOTTLE ONLY CRITICAL RESULT CALLED TO, READ BACK BY AND VERIFIED WITH: J. Eden Emms, AT [id number] 06/05/20 Renato Shin Performed at [facility], [address] [place], [place] [address]    Culture CANDIDA GLABRATA (A)  Final   Report Status 06/07/2020 FINAL  Final  Culture, blood (routine x 2)     Status: Abnormal   Collection Time: 06/04/20  3:19 PM   Specimen: BLOOD  Result Value Ref Range Status   Specimen Description BLOOD LEFT ANTECUBITAL  Final   Special Requests   Final    BOTTLES DRAWN AEROBIC AND ANAEROBIC Blood Culture results may not be optimal due to an inadequate volume of blood received in culture bottles   Culture  Setup Time   Final    GRAM POSITIVE COCCI IN CLUSTERS ANAEROBIC BOTTLE ONLY CRITICAL RESULT CALLED TO, READ BACK BY AND VERIFIED WITH: PHARMD JESSICA C 2000 [id number] FCP    Culture (A)  Final    STAPHYLOCOCCUS EPIDERMIDIS THE SIGNIFICANCE OF ISOLATING THIS ORGANISM FROM A SINGLE SET OF BLOOD CULTURES WHEN MULTIPLE SETS ARE DRAWN IS UNCERTAIN. PLEASE NOTIFY THE MICROBIOLOGY DEPARTMENT WITHIN ONE WEEK IF SPECIATION AND SENSITIVITIES ARE REQUIRED. Performed at [facility], [address] [place], [place] [address]    Report Status 06/07/2020 FINAL  Final  Blood Culture ID Panel (Reflexed)     Status: Abnormal   Collection Time: 06/04/20  3:19 PM  Result Value Ref Range Status   Enterococcus faecalis NOT DETECTED NOT DETECTED Final   Enterococcus Faecium NOT DETECTED NOT DETECTED Final   Listeria monocytogenes NOT DETECTED NOT DETECTED Final   Staphylococcus  species DETECTED (A) NOT DETECTED Final    Comment: CRITICAL RESULT CALLED TO, READ BACK BY AND VERIFIED WITH: PHARMD JESSICA C 2000 [id number] FCP    Staphylococcus aureus (BCID) NOT DETECTED NOT DETECTED Final   Staphylococcus epidermidis DETECTED (A) NOT DETECTED Final    Comment: Methicillin (oxacillin) resistant coagulase negative staphylococcus. Possible blood culture contaminant (unless isolated from more than one blood culture draw or clinical case suggests pathogenicity). No antibiotic treatment is indicated for blood  culture contaminants. CRITICAL RESULT CALLED TO, READ BACK BY AND VERIFIED WITH: PHARMD JESSICA C [id number] FCP    Staphylococcus lugdunensis NOT DETECTED NOT DETECTED Final   Streptococcus species NOT DETECTED NOT DETECTED Final   Streptococcus agalactiae NOT DETECTED NOT DETECTED Final   Streptococcus pneumoniae NOT DETECTED NOT DETECTED Final   Streptococcus pyogenes NOT DETECTED NOT DETECTED Final   A.calcoaceticus-baumannii NOT DETECTED NOT DETECTED Final   Bacteroides fragilis NOT DETECTED NOT DETECTED Final   Enterobacterales NOT DETECTED NOT DETECTED Final   Enterobacter cloacae complex NOT DETECTED NOT DETECTED Final   Escherichia coli NOT DETECTED NOT DETECTED Final   Klebsiella aerogenes NOT DETECTED NOT DETECTED Final   Klebsiella oxytoca NOT DETECTED NOT DETECTED Final   Klebsiella pneumoniae NOT DETECTED NOT DETECTED Final   Proteus species NOT DETECTED NOT DETECTED Final   Salmonella species NOT DETECTED NOT DETECTED Final   Serratia marcescens NOT DETECTED NOT DETECTED Final   Haemophilus influenzae NOT DETECTED NOT DETECTED Final   Neisseria meningitidis NOT DETECTED NOT DETECTED Final   Pseudomonas aeruginosa NOT DETECTED NOT DETECTED Final   Stenotrophomonas maltophilia NOT DETECTED NOT DETECTED Final   Candida albicans NOT DETECTED NOT DETECTED Final   Candida auris NOT DETECTED NOT  DETECTED Final   Candida glabrata NOT DETECTED NOT DETECTED  Final   Candida krusei NOT DETECTED NOT DETECTED Final   Candida parapsilosis NOT DETECTED NOT DETECTED Final   Candida tropicalis NOT DETECTED NOT DETECTED Final   Cryptococcus neoformans/gattii NOT DETECTED NOT DETECTED Final   Methicillin resistance mecA/C DETECTED (A) NOT DETECTED Final    Comment: CRITICAL RESULT CALLED TO, READ BACK BY AND VERIFIED WITH: PHARMD JESSICA C [id number] FCP Performed at [facility], [address], [place], [place] [address]   Culture, blood (routine x 2)     Status: None   Collection Time: 06/04/20  3:20 PM   Specimen: BLOOD RIGHT HAND  Result Value Ref Range Status   Specimen Description BLOOD RIGHT HAND  Final   Special Requests   Final    BOTTLES DRAWN AEROBIC ONLY Blood Culture results may not be optimal due to an inadequate volume of blood received in culture bottles   Culture   Final    NO GROWTH 5 DAYS Performed at [facility], [address] [place], [place] [address]    Report Status 06/09/2020 FINAL  Final     Time coordinating discharge: Over 30 minutes  SIGNED:   Marinda Elk, MD  [facility] 06/10/2020, 1:57 PM Pager   If 7PM-7AM, please contact night-coverage [email or web address] Password [email or web address]

## 2020-06-10 NOTE — Progress Notes (Signed)
[facility] PROGRESS NOTE    Progress Note  Jason [name]  KDT:[id number] DOB: Aug 10, 1950 DOA: 05/11/2020 PCP: Jason Monday, MD     Brief Narrative:   Jason [name] is an 70 y.o. 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  He received Kcentra for reversal of Eliquis was intubated and had a ventricular drain placed on 05/12/2020.  Extubated on 05/16/2020, EVD dislodged on 05/20/2020, was was reintubated on 05/20/2020 eventually extubated on 05/23/2020 and placed on BiPAP.  [facility] where patient has been persistently encephalopathic, palliative care was consulted.  Started developing sepsis physiology on 05/31/2020, after discussion with Perative care CODE STATUS was changed to DNR.  Blood cultures were positive for Candida laparotomy and started on antifungal.  Speech evaluated the patient and recommended a dysphagia 1 diet, his mentation continues to wax and wane  Significant Events: 05/11/2020 admission with a thalamic hemorrhagic infarct and intubated. Extubated on 05/23/2020.  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 05/18/2020 repeated CT of the head without contrast that showed similar size left thalamic intraparenchymal hemorrhage, with an interval placement of a right parietal ventriculostomy catheter with the tip in the right lateral ventricle 05/21/2020 CT of the head that showed interval removal of right frontal ventriculostomy, stable mild ventriculomegaly and ventricular hemorrhage 05/21/2020 CT angio of the head and neck showed negative large vessel occlusion.  Extensive atheromatous plaque. 05/31/2020 CT of the head showed decrease  interventricular hemorrhage with layering within the occipital horns bilaterally.  Antibiotics: 06/05/2020 IV Diflucan  Microbiology data: 05/31/2020 blood culture: Candida glabrata 06/02/2020 blood cultures: Candida glabrata 06/04/2020 blood culture 1 out of 2 positive for staph epidermidis  Procedures: 05/12/2020 EVD placed due to worsening neurological status and high intracranial pressure.  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 He is aspirating, with a fungemia and a new extensive bilateral lower extremity DVT we have explained to them that we believe you will run out of options they seem to understand. After the palliative care meeting they decided to move towards comfort care all medications and labs were drawn and he was started on a morphine as needed.  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 fib/a flutter: Chronic systolic heart failure: Hypovolemic hypernatremia: Hypertensive emergency/uncontrolled hypertension: History of CAD: Diabetes mellitus type 2 uncontrolled with an A1c of 9.4: Thrombocytopenia: New DVT B/L: Goals of care:  Stage II sacral decubitus ulcer: RN Pressure Injury Documentation: Pressure Injury 05/24/20 Buttocks Bilateral;Medial Deep Tissue Pressure Injury - Purple or maroon localized area of discolored intact skin or blood-filled blister due to damage of underlying soft tissue from pressure and/or shear. Multiple blisters along (Active)  05/24/20 0800  Location: Buttocks  Location Orientation: Bilateral;Medial  Staging: Deep Tissue Pressure Injury - Purple or maroon localized area of discolored intact skin or blood-filled blister due to  damage of underlying soft tissue from pressure and/or shear.  Wound Description (Comments): Multiple blisters along with deep tissue injury.  Present on Admission: No     DVT prophylaxis:  SCD Family Communication:Son Status is: Inpatient  Remains inpatient appropriate because:Hemodynamically unstable   Dispo: The patient is from: Home              Anticipated d/c is to: SNF              Patient currently is not medically stable to d/c.   Difficult to place patient No        Code Status:     Code Status Orders  (From admission, onward)         Start     Ordered   06/01/20 1535  Do not attempt resuscitation (DNR)  Continuous       Question Answer Comment  In the event of cardiac or respiratory ARREST Do not call a "code blue"   In the event of cardiac or respiratory ARREST Do not perform Intubation, CPR, defibrillation or ACLS   In the event of cardiac or respiratory ARREST Use medication by any route, position, wound care, and other measures to relive pain and suffering. May use oxygen, suction and manual treatment of airway obstruction as needed for comfort.      06/01/20 1534        Code Status History    Date Active Date Inactive Code Status Order ID Comments User Context   05/11/2020 2334 06/01/2020 1534 Full Code [id number]  Gordy CouncilmanBhagat, Srishti L, MD Inpatient   02/26/2020 1753 03/09/2020 0325 Full Code [id number]  Dellia Cloudoe, Benjamin, MD ED   Advance Care Planning Activity        IV Access:    Peripheral IV   Procedures and diagnostic studies:   No results found.   Medical Consultants:    None.   Subjective:    Jason [name] nonverbal  Objective:    Vitals:   06/08/20 1448 06/08/20 2055 06/09/20 0805 06/09/20 2324  BP:  136/84 130/74 (!) 144/70  Pulse:  90 (!) 143 (!) 143  Resp:  (!) 30 (!) 24 (!) 22  Temp: 98.6 F (37 C)  98.7 F (37.1 C) (!) 100.5 F (38.1 C)  TempSrc:   Oral Axillary  SpO2:  97% 98% 98%  Weight:      Height:        SpO2: 98 % O2 Flow Rate (L/min): 2 L/min FiO2 (%): 30 %   Intake/Output Summary (Last 24 hours) at 06/10/2020 0843 Last data filed at 06/10/2020 0535 Gross per 24 hour  Intake --  Output 1700 ml  Net -1700 ml   Filed Weights   06/03/20 0500 06/04/20 0500 06/07/20 0500  Weight: 102.5 kg 100.9 kg 103.2 kg    Exam: General exam: In no acute distress. Respiratory system: Good air movement and clear to auscultation. Cardiovascular system: S1 & S2 heard, RRR. No JVD. Gastrointestinal system: Abdomen is nondistended, soft and nontender.  Extremities: No pedal edema. Skin: No rashes, lesions or ulcers  Data Reviewed:    Labs: Basic Metabolic Panel: Recent Labs  Lab 06/04/20 0326 06/05/20 0433 06/06/20 0435 06/07/20 0419 06/08/20 1018  NA 146* 146* 149* 150* 155*  K 3.5 3.5 3.0* 3.0* 3.0*  CL 114* 116* 119* 121* 121*  CO2 21* 23 21* 22 24  GLUCOSE 154* 254* 199* 210* 217*  BUN 51* 53* 53* 58* 67*  CREATININE 1.85* 1.79* 1.79* 1.89* 2.20*  CALCIUM 7.9* 8.0* 8.0* 8.2* 8.1*   GFR Estimated Creatinine Clearance: 37.1 mL/min (A) (by C-G formula based on SCr of 2.2 mg/dL (H)). Liver Function Tests: Recent Labs  Lab 06/05/20 0433  AST 69*  ALT 127*  ALKPHOS 385*  BILITOT 0.7  PROT 5.8*  ALBUMIN 1.7*   No results for input(s): LIPASE, AMYLASE in the last 168 hours. No results for input(s): AMMONIA in the last 168 hours. Coagulation profile No results for input(s): INR, PROTIME in the last 168 hours. COVID-19 Labs  No results for input(s): DDIMER, FERRITIN, LDH, CRP in the last 72 hours.  Lab Results  Component Value Date   SARSCOV2NAA NEGATIVE 05/11/2020   SARSCOV2NAA NEGATIVE 03/08/2020   SARSCOV2NAA NEGATIVE 02/26/2020    CBC: Recent Labs  Lab 06/04/20 0326 06/05/20 0433 06/06/20 0435 06/07/20 0419  WBC 7.7 7.1 9.1 10.1  HGB 12.7* 14.7 12.8* 12.8*  HCT 39.6 44.7 39.5 40.1  MCV 86.7 85.6 85.9 88.3  PLT 58* 43* 65* 82*   Cardiac Enzymes: No  results for input(s): CKTOTAL, CKMB, CKMBINDEX, TROPONINI in the last 168 hours. BNP (last 3 results) No results for input(s): PROBNP in the last 8760 hours. CBG: Recent Labs  Lab 06/07/20 2005 06/07/20 2308 06/08/20 0318 06/08/20 0812 06/08/20 1301  GLUCAP 193* 210* 192* 193* 180*   D-Dimer: No results for input(s): DDIMER in the last 72 hours. Hgb A1c: No results for input(s): HGBA1C in the last 72 hours. Lipid Profile: No results for input(s): CHOL, HDL, LDLCALC, TRIG, CHOLHDL, LDLDIRECT in the last 72 hours. Thyroid function studies: No results for input(s): TSH, T4TOTAL, T3FREE, THYROIDAB in the last 72 hours.  Invalid input(s): FREET3 Anemia work up: No results for input(s): VITAMINB12, FOLATE, FERRITIN, TIBC, IRON, RETICCTPCT in the last 72 hours. Sepsis Labs: Recent Labs  Lab 06/04/20 0326 06/05/20 0433 06/06/20 0435 06/07/20 0419  WBC 7.7 7.1 9.1 10.1   Microbiology Recent Results (from the past 240 hour(s))  Culture, blood (routine x 2)     Status: Abnormal (Preliminary result)   Collection Time: 05/31/20  9:28 AM   Specimen: BLOOD  Result Value Ref Range Status   Specimen Description BLOOD LEFT ANTECUBITAL  Final   Special Requests   Final    BOTTLES DRAWN AEROBIC AND ANAEROBIC Blood Culture results may not be optimal due to an inadequate volume of blood received in culture bottles   Culture  Setup Time (A)  Final    YEAST IN BOTH AEROBIC AND ANAEROBIC BOTTLES CRITICAL RESULT CALLED TO, READ BACK BY AND VERIFIED WITH: PHARMD THOMAS J. 1326 [id number] FCP    Culture (A)  Final    CANDIDA GLABRATA Sent to [facility] for further susceptibility testing. Performed at [facility], [address], [place], [place] [address]    Report Status PENDING  Incomplete  Blood Culture ID Panel (Reflexed)     Status: Abnormal   Collection Time: 05/31/20  9:28 AM  Result Value Ref Range Status   Enterococcus faecalis NOT DETECTED NOT DETECTED Final   Enterococcus  Faecium NOT DETECTED NOT DETECTED Final   Listeria monocytogenes NOT DETECTED NOT DETECTED Final   Staphylococcus species NOT DETECTED NOT DETECTED Final   Staphylococcus aureus (BCID) NOT DETECTED NOT DETECTED Final   Staphylococcus epidermidis NOT DETECTED NOT DETECTED Final   Staphylococcus lugdunensis NOT DETECTED NOT DETECTED Final   Streptococcus species NOT DETECTED NOT DETECTED Final   Streptococcus agalactiae NOT DETECTED NOT DETECTED Final   Streptococcus pneumoniae NOT DETECTED NOT DETECTED Final   Streptococcus pyogenes NOT DETECTED NOT DETECTED Final   A.calcoaceticus-baumannii NOT DETECTED NOT DETECTED Final   Bacteroides fragilis NOT DETECTED NOT DETECTED Final  Enterobacterales NOT DETECTED NOT DETECTED Final   Enterobacter cloacae complex NOT DETECTED NOT DETECTED Final   Escherichia coli NOT DETECTED NOT DETECTED Final   Klebsiella aerogenes NOT DETECTED NOT DETECTED Final   Klebsiella oxytoca NOT DETECTED NOT DETECTED Final   Klebsiella pneumoniae NOT DETECTED NOT DETECTED Final   Proteus species NOT DETECTED NOT DETECTED Final   Salmonella species NOT DETECTED NOT DETECTED Final   Serratia marcescens NOT DETECTED NOT DETECTED Final   Haemophilus influenzae NOT DETECTED NOT DETECTED Final   Neisseria meningitidis NOT DETECTED NOT DETECTED Final   Pseudomonas aeruginosa NOT DETECTED NOT DETECTED Final   Stenotrophomonas maltophilia NOT DETECTED NOT DETECTED Final   Candida albicans NOT DETECTED NOT DETECTED Final   Candida auris NOT DETECTED NOT DETECTED Final   Candida glabrata DETECTED (A) NOT DETECTED Final    Comment: CRITICAL RESULT CALLED TO, READ BACK BY AND VERIFIED WITH: PHARMD THOMAS J. 1326 [id number] FCP    Candida krusei NOT DETECTED NOT DETECTED Final   Candida parapsilosis NOT DETECTED NOT DETECTED Final   Candida tropicalis NOT DETECTED NOT DETECTED Final   Cryptococcus neoformans/gattii NOT DETECTED NOT DETECTED Final    Comment: Performed at [facility], [address], [place], [place] [address]  Antifungal AST 9 Drug Panel     Status: None   Collection Time: 05/31/20  9:28 AM  Result Value Ref Range Status   Organism ID, Yeast Candida glabrata  Corrected    Comment: (NOTE) Identification performed by account, not confirmed by this laboratory. CORRECTED ON 03/22 AT 1036: PREVIOUSLY REPORTED AS Preliminary report    Amphotericin B MIC 1.0 ug/mL  Final    Comment: (NOTE) *Breakpoints have been established for only some organism-drug combinations as indicated. Results of this test are labeled for research purposes only by the assay's manufacturer. The performance characteristics of this assay have not been established by the manufacturer. The result should not be used for treatment or for diagnostic purposes without confirmation of the diagnosis by another medically established diagnostic product or procedure. The performance characteristics were determined by [facility].    Please Note: PENDING  Incomplete   Anidulafungin MIC Comment  Final    Comment: (NOTE) 0.03 ug/mL Susceptible *Breakpoints have been established for only some organism-drug combinations as indicated. Results of this test are labeled for research purposes only by the assay's manufacturer. The performance characteristics of this assay have not been established by the manufacturer. The result should not be used for treatment or for diagnostic purposes without confirmation of the diagnosis by another medically established diagnostic product or procedure. The performance characteristics were determined by [facility].    Caspofungin MIC Comment  Final    Comment: 0.06 ug/mL Susceptible   Micafungin MIC Comment  Final    Comment: 0.016 ug/mL Susceptible   Posaconazole MIC 1.0 ug/mL  Final    Comment: (NOTE) *Breakpoints have been established for only some organism-drug combinations as indicated. Results of this test are labeled for research purposes  only by the assay's manufacturer. The performance characteristics of this assay have not been established by the manufacturer. The result should not be used for treatment or for diagnostic purposes without confirmation of the diagnosis by another medically established diagnostic product or procedure. The performance characteristics were determined by [facility].    Please Note: PENDING  Incomplete   Fluconazole Islt MIC 16.0 ug/mL  Final    Comment: Susceptible Dose Dependent   Flucytosine MIC 0.06 ug/mL or less  Final   Itraconazole MIC 1.0 ug/mL  Final   Voriconazole MIC 0.5 ug/mL  Final    Comment: (NOTE) Performed At: [facility] [address] [place], [place] [id number] Jolene Schimke MD WC:[id number]    Source PENDING  Incomplete  Culture, blood (routine x 2)     Status: Abnormal (Preliminary result)   Collection Time: 05/31/20  9:36 AM   Specimen: BLOOD LEFT HAND  Result Value Ref Range Status   Specimen Description BLOOD LEFT HAND  Final   Special Requests   Final    BOTTLES DRAWN AEROBIC AND ANAEROBIC Blood Culture results may not be optimal due to an inadequate volume of blood received in culture bottles   Culture  Setup Time   Final    YEAST IN BOTH AEROBIC AND ANAEROBIC BOTTLES CRITICAL VALUE NOTED.  VALUE IS CONSISTENT WITH PREVIOUSLY REPORTED AND CALLED VALUE. Performed at [facility], [address] [place], [place] [address]    Culture CANDIDA GLABRATA (A)  Final   Report Status PENDING  Incomplete  MRSA PCR Screening     Status: None   Collection Time: 05/31/20  3:52 PM   Specimen: Nasal Mucosa; Nasopharyngeal  Result Value Ref Range Status   MRSA by PCR NEGATIVE NEGATIVE Final    Comment:        The GeneXpert MRSA Assay (FDA approved for NASAL specimens only), is one component of a comprehensive MRSA colonization surveillance program. It is not intended to diagnose MRSA infection nor to guide or monitor treatment for MRSA  infections. Performed at [facility], [address] [place], [place] [address]   Culture, blood (Routine X 2) w Reflex to ID Panel     Status: Abnormal   Collection Time: 06/02/20 10:08 AM   Specimen: BLOOD  Result Value Ref Range Status   Specimen Description BLOOD LEFT ANTECUBITAL  Final   Special Requests   Final    BOTTLES DRAWN AEROBIC ONLY Blood Culture adequate volume   Culture  Setup Time   Final    YEAST AEROBIC BOTTLE ONLY CRITICAL VALUE NOTED.  VALUE IS CONSISTENT WITH PREVIOUSLY REPORTED AND CALLED VALUE. Performed at [facility], [address] [place], [place] [address]    Culture CANDIDA GLABRATA (A)  Final   Report Status 06/07/2020 FINAL  Final  Culture, blood (Routine X 2) w Reflex to ID Panel     Status: Abnormal   Collection Time: 06/02/20 10:08 AM   Specimen: BLOOD LEFT HAND  Result Value Ref Range Status   Specimen Description BLOOD LEFT HAND  Final   Special Requests   Final    BOTTLES DRAWN AEROBIC ONLY Blood Culture adequate volume   Culture  Setup Time   Final    YEAST WITH PSEUDOHYPHAE AEROBIC BOTTLE ONLY CRITICAL RESULT CALLED TO, READ BACK BY AND VERIFIED WITH: J. Eden Emms, AT 7619 06/05/20 Renato Shin Performed at [facility], [address] [place], [place] [address]    Culture CANDIDA GLABRATA (A)  Final   Report Status 06/07/2020 FINAL  Final  Culture, blood (routine x 2)     Status: Abnormal   Collection Time: 06/04/20  3:19 PM   Specimen: BLOOD  Result Value Ref Range Status   Specimen Description BLOOD LEFT ANTECUBITAL  Final   Special Requests   Final    BOTTLES DRAWN AEROBIC AND ANAEROBIC Blood Culture results may not be optimal due to an inadequate volume of blood received  in culture bottles   Culture  Setup Time   Final    GRAM POSITIVE COCCI IN CLUSTERS ANAEROBIC BOTTLE ONLY CRITICAL RESULT CALLED TO, READ BACK BY AND VERIFIED WITH: PHARMD JESSICA C 2000 [id number] FCP    Culture (A)  Final    STAPHYLOCOCCUS  EPIDERMIDIS THE SIGNIFICANCE OF ISOLATING THIS ORGANISM FROM A SINGLE SET OF BLOOD CULTURES WHEN MULTIPLE SETS ARE DRAWN IS UNCERTAIN. PLEASE NOTIFY THE MICROBIOLOGY DEPARTMENT WITHIN ONE WEEK IF SPECIATION AND SENSITIVITIES ARE REQUIRED. Performed at [facility], [address] [place], [place] [address]    Report Status 06/07/2020 FINAL  Final  Blood Culture ID Panel (Reflexed)     Status: Abnormal   Collection Time: 06/04/20  3:19 PM  Result Value Ref Range Status   Enterococcus faecalis NOT DETECTED NOT DETECTED Final   Enterococcus Faecium NOT DETECTED NOT DETECTED Final   Listeria monocytogenes NOT DETECTED NOT DETECTED Final   Staphylococcus species DETECTED (A) NOT DETECTED Final    Comment: CRITICAL RESULT CALLED TO, READ BACK BY AND VERIFIED WITH: PHARMD JESSICA C 2000 [id number] FCP    Staphylococcus aureus (BCID) NOT DETECTED NOT DETECTED Final   Staphylococcus epidermidis DETECTED (A) NOT DETECTED Final    Comment: Methicillin (oxacillin) resistant coagulase negative staphylococcus. Possible blood culture contaminant (unless isolated from more than one blood culture draw or clinical case suggests pathogenicity). No antibiotic treatment is indicated for blood  culture contaminants. CRITICAL RESULT CALLED TO, READ BACK BY AND VERIFIED WITH: PHARMD JESSICA C 2000 [id number] FCP    Staphylococcus lugdunensis NOT DETECTED NOT DETECTED Final   Streptococcus species NOT DETECTED NOT DETECTED Final   Streptococcus agalactiae NOT DETECTED NOT DETECTED Final   Streptococcus pneumoniae NOT DETECTED NOT DETECTED Final   Streptococcus pyogenes NOT DETECTED NOT DETECTED Final   A.calcoaceticus-baumannii NOT DETECTED NOT DETECTED Final   Bacteroides fragilis NOT DETECTED NOT DETECTED Final   Enterobacterales NOT DETECTED NOT DETECTED Final   Enterobacter cloacae complex NOT DETECTED NOT DETECTED Final   Escherichia coli NOT DETECTED NOT DETECTED Final   Klebsiella aerogenes NOT DETECTED  NOT DETECTED Final   Klebsiella oxytoca NOT DETECTED NOT DETECTED Final   Klebsiella pneumoniae NOT DETECTED NOT DETECTED Final   Proteus species NOT DETECTED NOT DETECTED Final   Salmonella species NOT DETECTED NOT DETECTED Final   Serratia marcescens NOT DETECTED NOT DETECTED Final   Haemophilus influenzae NOT DETECTED NOT DETECTED Final   Neisseria meningitidis NOT DETECTED NOT DETECTED Final   Pseudomonas aeruginosa NOT DETECTED NOT DETECTED Final   Stenotrophomonas maltophilia NOT DETECTED NOT DETECTED Final   Candida albicans NOT DETECTED NOT DETECTED Final   Candida auris NOT DETECTED NOT DETECTED Final   Candida glabrata NOT DETECTED NOT DETECTED Final   Candida krusei NOT DETECTED NOT DETECTED Final   Candida parapsilosis NOT DETECTED NOT DETECTED Final   Candida tropicalis NOT DETECTED NOT DETECTED Final   Cryptococcus neoformans/gattii NOT DETECTED NOT DETECTED Final   Methicillin resistance mecA/C DETECTED (A) NOT DETECTED Final    Comment: CRITICAL RESULT CALLED TO, READ BACK BY AND VERIFIED WITH: PHARMD JESSICA C 2000 [id number] FCP Performed at [facility], [address] [place], [place] [address]   Culture, blood (routine x 2)     Status: None   Collection Time: 06/04/20  3:20 PM   Specimen: BLOOD RIGHT HAND  Result Value Ref Range Status   Specimen Description BLOOD RIGHT HAND  Final   Special Requests   Final  BOTTLES DRAWN AEROBIC ONLY Blood Culture results may not be optimal due to an inadequate volume of blood received in culture bottles   Culture   Final    NO GROWTH 5 DAYS Performed at [facility], [address] [place], [place] [address]    Report Status 06/09/2020 FINAL  Final     Medications:   . mouth rinse  15 mL Mouth Rinse q12n4p  .  morphine injection  2 mg Intravenous Q4H   Continuous Infusions: . levETIRAcetam        LOS: 30 days   Marinda Elk  [facility]  06/10/2020, 8:43 AM

## 2020-06-10 NOTE — TOC Transition Note (Signed)
Transition of Care [facility]) - CM/SW Discharge Note   Patient Details  Name: Aceyn Kathol MRN: [id number] Date of Birth: 1950-03-22  Transition of [facility]) CM/SW Contact:  Carley Hammed, LCSWA Phone Number: 06/10/2020, 2:12 PM   Clinical Narrative:    Pt to be transported to [facility] By [facility]. Nurse to call report to [phone]   Final next level of care: Hospice Medical Facility Barriers to Discharge: Continued Medical Work up   Patient Goals and CMS Choice   CMS Medicare.gov Compare Post Acute Care list provided to:: Patient Represenative (must comment) (Son [name]) Choice offered to / list presented to : Adult Children (Son [name])  Discharge Placement              Patient chooses bed at:  [facility]) Patient to be transferred to facility by: [facility] Name of family member notified: [name] Patient and family notified of of transfer: 06/10/20  Discharge Plan and Services In-house Referral: Clinical Social Work                                   Social Determinants of Health (SDOH) Interventions     Readmission Risk Interventions No flowsheet data found.

## 2020-06-16 DEATH — deceased

## 2020-07-24 DIAGNOSIS — Z515 Encounter for palliative care: Secondary | ICD-10-CM

## 2022-01-10 LAB — ANTIFUNGAL AST 9 DRUG PANEL
Amphotericin B MIC: 1
Fluconazole Islt MIC: 16
Flucytosine MIC: 0.06
Itraconazole MIC: 1
Posaconazole MIC: 1
Voriconazole MIC: 0.5

## 2022-12-24 IMAGING — DX DG CHEST 1V PORT
1 series · 1 of 1 positions shown · non-contrast
Comparison: Chest radiograph dated 06/04/2020.

CLINICAL DATA: 70-year-old male with respiratory distress.

EXAM:
PORTABLE CHEST 1 VIEW

[chest ap]
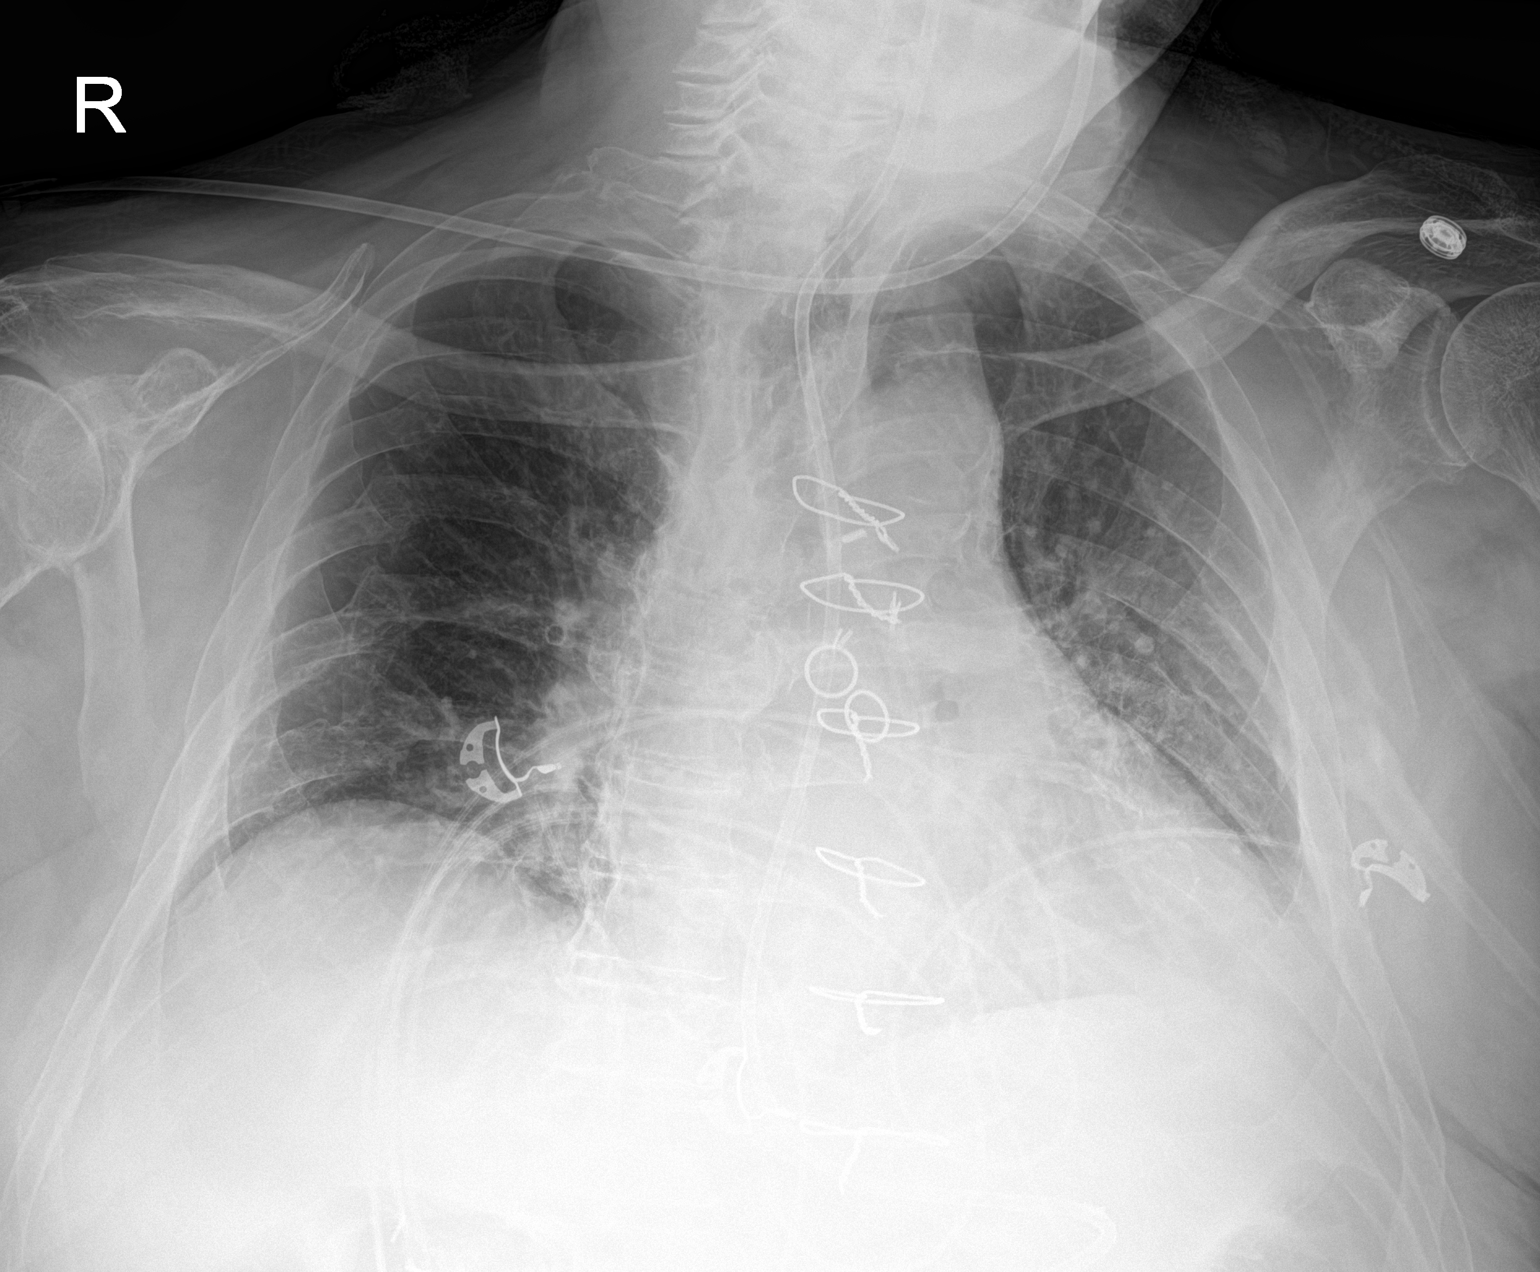

[1 of 1 positions shown; findings below may reference images not displayed]

FINDINGS: Feeding tube extends below the diaphragm. No interval change in the
patchy left lung base density compared to prior radiograph. No large
pleural effusion. No pneumothorax. Stable cardiomediastinal
silhouette. Atherosclerotic calcification of the aorta. Median
sternotomy wires and CABG vascular clips. No acute osseous
pathology. Old healed right posterior rib fractures.
IMPRESSION: No interval change.
